# Patient Record
Sex: Male | Born: 1945
Health system: Southern US, Community
[De-identification: ages and names within clinical notes are randomized; demographics above are authoritative.]

## PROBLEM LIST (undated history)

## (undated) DIAGNOSIS — R351 Nocturia: Secondary | ICD-10-CM

## (undated) DIAGNOSIS — M2550 Pain in unspecified joint: Secondary | ICD-10-CM

## (undated) DIAGNOSIS — K219 Gastro-esophageal reflux disease without esophagitis: Secondary | ICD-10-CM

## (undated) DIAGNOSIS — M62838 Other muscle spasm: Secondary | ICD-10-CM

## (undated) DIAGNOSIS — I499 Cardiac arrhythmia, unspecified: Secondary | ICD-10-CM

## (undated) DIAGNOSIS — Z8619 Personal history of other infectious and parasitic diseases: Secondary | ICD-10-CM

## (undated) DIAGNOSIS — R600 Localized edema: Secondary | ICD-10-CM

## (undated) DIAGNOSIS — G8929 Other chronic pain: Secondary | ICD-10-CM

## (undated) DIAGNOSIS — I4891 Unspecified atrial fibrillation: Secondary | ICD-10-CM

## (undated) DIAGNOSIS — F329 Major depressive disorder, single episode, unspecified: Secondary | ICD-10-CM

## (undated) DIAGNOSIS — M2540 Effusion, unspecified joint: Secondary | ICD-10-CM

## (undated) DIAGNOSIS — C801 Malignant (primary) neoplasm, unspecified: Secondary | ICD-10-CM

## (undated) DIAGNOSIS — F32A Depression, unspecified: Secondary | ICD-10-CM

## (undated) DIAGNOSIS — J302 Other seasonal allergic rhinitis: Secondary | ICD-10-CM

## (undated) DIAGNOSIS — E291 Testicular hypofunction: Secondary | ICD-10-CM

## (undated) DIAGNOSIS — M549 Dorsalgia, unspecified: Secondary | ICD-10-CM

## (undated) DIAGNOSIS — Z9889 Other specified postprocedural states: Secondary | ICD-10-CM

## (undated) DIAGNOSIS — K225 Diverticulum of esophagus, acquired: Secondary | ICD-10-CM

## (undated) DIAGNOSIS — J189 Pneumonia, unspecified organism: Secondary | ICD-10-CM

## (undated) DIAGNOSIS — I509 Heart failure, unspecified: Secondary | ICD-10-CM

## (undated) DIAGNOSIS — M109 Gout, unspecified: Secondary | ICD-10-CM

## (undated) DIAGNOSIS — R609 Edema, unspecified: Secondary | ICD-10-CM

## (undated) DIAGNOSIS — M1990 Unspecified osteoarthritis, unspecified site: Secondary | ICD-10-CM

## (undated) DIAGNOSIS — Z8709 Personal history of other diseases of the respiratory system: Secondary | ICD-10-CM

## (undated) HISTORY — PX: MECKEL DIVERTICULUM EXCISION: SHX314

## (undated) HISTORY — PX: TONSILLECTOMY AND ADENOIDECTOMY: SHX28

## (undated) HISTORY — PX: SHOULDER ARTHROSCOPY: SHX128

## (undated) HISTORY — PX: HERNIA REPAIR: SHX51

## (undated) HISTORY — PX: KNEE SURGERY: SHX244

## (undated) HISTORY — PX: TONSILLECTOMY: SHX28A

## (undated) HISTORY — PX: ELBOW ARTHROSCOPY: SHX614

## (undated) HISTORY — PX: TONSILLECTOMY AND ADENOIDECTOMY: SUR1326

## (undated) HISTORY — DX: Testicular hypofunction: E29.1

## (undated) HISTORY — PX: TUMOR EXCISION: SHX421

## (undated) HISTORY — PX: ELBOW SURGERY: SHX618

## (undated) HISTORY — PX: INGUINAL HERNIA REPAIR: SUR1180

## (undated) HISTORY — PX: COLONOSCOPY: SHX174

## (undated) HISTORY — PX: ORIF HIP FRACTURE: SHX2125

## (undated) HISTORY — PX: ANKLE SURGERY: SHX546

## (undated) HISTORY — PX: OTHER SURGICAL HISTORY: SHX169

## (undated) HISTORY — DX: Unspecified osteoarthritis, unspecified site: M19.90

## (undated) HISTORY — DX: Gout, unspecified: M10.9

---
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
## 1967-04-04 DIAGNOSIS — J189 Pneumonia, unspecified organism: Secondary | ICD-10-CM

## 1967-04-04 HISTORY — DX: Pneumonia, unspecified organism: J18.9

## 2004-03-03 ENCOUNTER — Emergency Department (HOSPITAL_COMMUNITY): Admission: EM | Admit: 2004-03-03 | Discharge: 2004-03-03 | Payer: Self-pay | Admitting: Emergency Medicine

## 2004-12-17 ENCOUNTER — Encounter: Admission: RE | Admit: 2004-12-17 | Discharge: 2004-12-17 | Payer: Self-pay | Admitting: Orthopedic Surgery

## 2004-12-18 ENCOUNTER — Encounter: Admission: RE | Admit: 2004-12-18 | Discharge: 2004-12-18 | Payer: Self-pay | Admitting: Orthopedic Surgery

## 2005-01-17 ENCOUNTER — Encounter (INDEPENDENT_AMBULATORY_CARE_PROVIDER_SITE_OTHER): Payer: Self-pay | Admitting: *Deleted

## 2005-01-17 ENCOUNTER — Ambulatory Visit (HOSPITAL_COMMUNITY): Admission: RE | Admit: 2005-01-17 | Discharge: 2005-01-17 | Payer: Self-pay | Admitting: Orthopaedic Surgery

## 2005-01-17 ENCOUNTER — Ambulatory Visit (HOSPITAL_BASED_OUTPATIENT_CLINIC_OR_DEPARTMENT_OTHER): Admission: RE | Admit: 2005-01-17 | Discharge: 2005-01-17 | Payer: Self-pay | Admitting: Orthopaedic Surgery

## 2006-09-16 ENCOUNTER — Ambulatory Visit: Payer: Self-pay | Admitting: Cardiology

## 2006-09-16 ENCOUNTER — Emergency Department (HOSPITAL_COMMUNITY): Admission: EM | Admit: 2006-09-16 | Discharge: 2006-09-16 | Payer: Self-pay | Admitting: *Deleted

## 2006-09-21 ENCOUNTER — Ambulatory Visit: Payer: Self-pay

## 2006-10-01 ENCOUNTER — Encounter: Payer: Self-pay | Admitting: Cardiology

## 2006-10-01 ENCOUNTER — Ambulatory Visit: Payer: Self-pay

## 2006-10-10 ENCOUNTER — Ambulatory Visit: Payer: Self-pay | Admitting: Cardiology

## 2006-10-21 ENCOUNTER — Ambulatory Visit: Payer: Self-pay | Admitting: Cardiology

## 2006-10-22 ENCOUNTER — Ambulatory Visit: Payer: Self-pay | Admitting: Cardiology

## 2006-12-25 ENCOUNTER — Ambulatory Visit: Payer: Self-pay | Admitting: Cardiology

## 2007-06-10 ENCOUNTER — Ambulatory Visit: Payer: Self-pay | Admitting: Cardiology

## 2008-04-24 ENCOUNTER — Encounter: Payer: Self-pay | Admitting: Internal Medicine

## 2008-05-04 ENCOUNTER — Ambulatory Visit: Payer: Self-pay | Admitting: Internal Medicine

## 2008-05-04 DIAGNOSIS — M545 Low back pain: Secondary | ICD-10-CM | POA: Insufficient documentation

## 2008-05-04 DIAGNOSIS — M1990 Unspecified osteoarthritis, unspecified site: Secondary | ICD-10-CM | POA: Insufficient documentation

## 2008-05-04 DIAGNOSIS — M109 Gout, unspecified: Secondary | ICD-10-CM | POA: Insufficient documentation

## 2008-06-22 ENCOUNTER — Telehealth: Payer: Self-pay | Admitting: Internal Medicine

## 2008-09-16 ENCOUNTER — Encounter: Payer: Self-pay | Admitting: Internal Medicine

## 2008-10-29 ENCOUNTER — Ambulatory Visit (HOSPITAL_COMMUNITY): Admission: RE | Admit: 2008-10-29 | Discharge: 2008-10-29 | Payer: Self-pay | Admitting: Surgery

## 2008-11-13 ENCOUNTER — Encounter: Payer: Self-pay | Admitting: Internal Medicine

## 2009-03-17 ENCOUNTER — Ambulatory Visit: Payer: Self-pay | Admitting: Internal Medicine

## 2009-03-17 DIAGNOSIS — R634 Abnormal weight loss: Secondary | ICD-10-CM | POA: Insufficient documentation

## 2009-03-19 ENCOUNTER — Telehealth: Payer: Self-pay | Admitting: Internal Medicine

## 2009-03-24 ENCOUNTER — Encounter: Payer: Self-pay | Admitting: Internal Medicine

## 2009-03-30 ENCOUNTER — Encounter: Admission: RE | Admit: 2009-03-30 | Discharge: 2009-03-30 | Payer: Self-pay | Admitting: Surgery

## 2009-03-30 ENCOUNTER — Encounter: Payer: Self-pay | Admitting: Internal Medicine

## 2009-04-07 ENCOUNTER — Emergency Department (HOSPITAL_COMMUNITY): Admission: EM | Admit: 2009-04-07 | Discharge: 2009-04-07 | Payer: Self-pay | Admitting: Emergency Medicine

## 2009-06-16 ENCOUNTER — Ambulatory Visit: Payer: Self-pay | Admitting: Internal Medicine

## 2009-06-17 DIAGNOSIS — J309 Allergic rhinitis, unspecified: Secondary | ICD-10-CM | POA: Insufficient documentation

## 2009-12-27 ENCOUNTER — Encounter: Payer: Self-pay | Admitting: Internal Medicine

## 2009-12-28 ENCOUNTER — Ambulatory Visit: Payer: Self-pay | Admitting: Internal Medicine

## 2009-12-28 LAB — CONVERTED CEMR LAB
ALT: 25 units/L (ref 0–53)
AST: 35 units/L (ref 0–37)
Albumin: 4 g/dL (ref 3.5–5.2)
Alkaline Phosphatase: 78 units/L (ref 39–117)
BUN: 9 mg/dL (ref 6–23)
Basophils Absolute: 0 10*3/uL (ref 0.0–0.1)
Basophils Relative: 0.7 % (ref 0.0–3.0)
Bilirubin Urine: NEGATIVE
Bilirubin, Direct: 0.2 mg/dL (ref 0.0–0.3)
Blood in Urine, dipstick: NEGATIVE
CO2: 30 meq/L (ref 19–32)
Calcium: 9.1 mg/dL (ref 8.4–10.5)
Chloride: 104 meq/L (ref 96–112)
Cholesterol: 203 mg/dL — ABNORMAL HIGH (ref 0–200)
Creatinine, Ser: 1 mg/dL (ref 0.4–1.5)
Direct LDL: 74.7 mg/dL
Eosinophils Absolute: 0.1 10*3/uL (ref 0.0–0.7)
Eosinophils Relative: 1.6 % (ref 0.0–5.0)
GFR calc non Af Amer: 83.68 mL/min (ref >60)
Glucose, Bld: 128 mg/dL — ABNORMAL HIGH (ref 70–99)
Glucose, Urine, Semiquant: NEGATIVE
HCT: 37.7 % — ABNORMAL LOW (ref 39.0–52.0)
HDL: 45 mg/dL (ref >39.00)
Hemoglobin: 13.2 g/dL (ref 13.0–17.0)
Ketones, urine, test strip: NEGATIVE
Lymphocytes Relative: 34.7 % (ref 12.0–46.0)
Lymphs Abs: 1.4 10*3/uL (ref 0.7–4.0)
MCHC: 35 g/dL (ref 30.0–36.0)
MCV: 99.9 fL (ref 78.0–100.0)
Monocytes Absolute: 0.3 10*3/uL (ref 0.1–1.0)
Monocytes Relative: 7.1 % (ref 3.0–12.0)
Neutro Abs: 2.3 10*3/uL (ref 1.4–7.7)
Neutrophils Relative %: 55.9 % (ref 43.0–77.0)
Nitrite: NEGATIVE
PSA: 0.32 ng/mL (ref 0.10–4.00)
Platelets: 189 10*3/uL (ref 150.0–400.0)
Potassium: 4.2 meq/L (ref 3.5–5.1)
Protein, U semiquant: NEGATIVE
RBC: 3.77 M/uL — ABNORMAL LOW (ref 4.22–5.81)
RDW: 13.6 % (ref 11.5–14.6)
Sodium: 141 meq/L (ref 135–145)
Specific Gravity, Urine: 1.02
TSH: 1.66 microintl units/mL (ref 0.35–5.50)
Total Bilirubin: 1 mg/dL (ref 0.3–1.2)
Total CHOL/HDL Ratio: 5
Total Protein: 6.9 g/dL (ref 6.0–8.3)
Triglycerides: 453 mg/dL — ABNORMAL HIGH (ref 0.0–149.0)
Urobilinogen, UA: 0.2
VLDL: 90.6 mg/dL — ABNORMAL HIGH (ref 0.0–40.0)
WBC Urine, dipstick: NEGATIVE
WBC: 4 10*3/uL — ABNORMAL LOW (ref 4.5–10.5)
pH: 5.5

## 2010-01-04 ENCOUNTER — Ambulatory Visit: Payer: Self-pay | Admitting: Internal Medicine

## 2010-01-04 ENCOUNTER — Encounter: Payer: Self-pay | Admitting: Internal Medicine

## 2010-01-04 DIAGNOSIS — E291 Testicular hypofunction: Secondary | ICD-10-CM | POA: Insufficient documentation

## 2010-01-04 DIAGNOSIS — J984 Other disorders of lung: Secondary | ICD-10-CM | POA: Insufficient documentation

## 2010-05-05 NOTE — Assessment & Plan Note (Signed)
Summary: 3 month rov/njr   Vital Signs:  Patient profile:   65 year old male Weight:      222 pounds Temp:     98.4 degrees F oral BP sitting:   104 / 60  (right arm) Cuff size:   regular  Vitals Entered By: Duard Brady LPN (June 16, 2009 11:47 AM) CC: 3 mos rov - things to discuss with the doctor Is Patient Diabetic? No   CC:  3 mos rov - things to discuss with the doctor.  History of Present Illness: 65 year old patient who is seen today for follow-up.  He has a history weight loss that has been stable.  He is followed closely at the [facility] system.  He has osteoarthritis, chronic low back pain and a chronic pain syndrome.  He is on chronic oxycodone.  There is a history of gout, which has been stable.  Symptoms today include sinus congestion and drainage more left-sided.  He states that he has a sinus CT scheduled soon through the [place] system.  He is wondering if he would benefit from an ENT evaluation.  Denies any fever, localized pain or purulent drainage.  Denies any dental pain is chronic pain syndrome seems stable.  Preventive Screening-Counseling & Management  Alcohol-Tobacco     Smoking Status: never  Allergies (verified): No Known Drug Allergies  Past History:  Past Medical History: Low back pain Osteoarthritis bilateral knee pain Gout Allergic rhinitis  Review of Systems  The patient denies anorexia, fever, weight loss, weight gain, vision loss, decreased hearing, hoarseness, chest pain, syncope, dyspnea on exertion, peripheral edema, prolonged cough, headaches, hemoptysis, abdominal pain, melena, hematochezia, severe indigestion/heartburn, hematuria, incontinence, genital sores, muscle weakness, suspicious skin lesions, transient blindness, difficulty walking, depression, unusual weight change, abnormal bleeding, enlarged lymph nodes, angioedema, breast masses, and testicular masses.    Physical Exam  General:  Well-developed,well-nourished,in no  acute distress; alert,appropriate and cooperative throughout examination Head:  Normocephalic and atraumatic without obvious abnormalities. No apparent alopecia or balding. Ears:  External ear exam shows no significant lesions or deformities.  Otoscopic examination reveals clear canals, tympanic membranes are intact bilaterally without bulging, retraction, inflammation or discharge. Hearing is grossly normal bilaterally. Nose:  External nasal examination shows no deformity or inflammation. Nasal mucosa are pink and moist without lesions or exudates. Mouth:  Oral mucosa and oropharynx without lesions or exudates.  Teeth in good repair. Neck:  No deformities, masses, or tenderness noted. Lungs:  Normal respiratory effort, chest expands symmetrically. Lungs are clear to auscultation, no crackles or wheezes. Heart:  Normal rate and regular rhythm. S1 and S2 normal without gallop, murmur, click, rub or other extra sounds. Abdomen:  Bowel sounds positive,abdomen soft and non-tender without masses, organomegaly or hernias noted.   Impression & Recommendations:  Problem # 1:  ALLERGIC RHINITIS (ICD-477.9)  His updated medication list for this problem includes:    Fluticasone Propionate 50 Mcg/act Susp (Fluticasone propionate) ..... Use daily as needed for sinus congestion the patient will bring his sinus CT x-rays  by  for review  Problem # 2:  GOUT (ICD-274.9)  His updated medication list for this problem includes:    Allopurinol 100 Mg Tabs (Allopurinol) .Marland Kitchen... 2 once daily    Colchicine 0.6 Mg Tabs (Colchicine) .Marland Kitchen... 1 two times a day is clinically stable  Problem # 3:  WEIGHT LOSS (ICD-783.21) clinically stable  Problem # 4:  LOW BACK PAIN (ICD-724.2)  His updated medication list for this problem includes:  Oxycodone Hcl 5 Mg Caps (Oxycodone hcl) .Marland Kitchen... 1 q6h as needed    Bayer Aspirin 325 Mg Tabs (Aspirin) .Marland Kitchen... 1 once daily    Ibuprofen 600 Mg Tabs (Ibuprofen) .Marland Kitchen... Three times a day  as needed    Indomethacin 50 Mg Caps (Indomethacin) .Marland Kitchen... 1 three times a day as needed followed at the [facility] system  Complete Medication List: 1)  Allopurinol 100 Mg Tabs (Allopurinol) .... 2 once daily 2)  Oxycodone Hcl 5 Mg Caps (Oxycodone hcl) .Marland Kitchen.. 1 q6h as needed 3)  Bayer Aspirin 325 Mg Tabs (Aspirin) .Marland Kitchen.. 1 once daily 4)  Colchicine 0.6 Mg Tabs (Colchicine) .Marland Kitchen.. 1 two times a day 5)  Ibuprofen 600 Mg Tabs (Ibuprofen) .... Three times a day as needed 6)  Indomethacin 50 Mg Caps (Indomethacin) .Marland Kitchen.. 1 three times a day as needed 7)  Clobetasol Propionate E 0.05 % Crea (Clobetasol prop emollient base) .... Use as needed 8)  Fluticasone Propionate 50 Mcg/act Susp (Fluticasone propionate) .... Use daily as needed for sinus congestion  Patient Instructions: 1)  Please schedule a follow-up appointment in 6 months. 2)  It is important that you exercise regularly at least 20 minutes 5 times a week. If you develop chest pain, have severe difficulty breathing, or feel very tired , stop exercising immediately and seek medical attention.

## 2010-05-05 NOTE — Assessment & Plan Note (Signed)
Summary: CPX/NJR---PT [name] // RS   Vital Signs:  [name] profile:   65 year old male Height:      72.5 inches Weight:      231 pounds BMI:     31.01 Temp:     98.2 degrees F oral BP sitting:   110 / 80  (left arm) Cuff size:   regular  Vitals Entered By: Duard Brady LPN (January 04, 2010 2:08 PM) CC: cpx - doing well Is [name] Diabetic? No   CC:  cpx - doing well.  History of Present Illness: [name] who is seen today for an annual exam.  He is followed closely by urology and had an abdominal CT scan performed recently showed a 6-mm left lower lobe nodule.  The [name] states that he had had a CT scan 6 to 8 months prior and was due for follow-up in December of this year.  He states that the 6-mm nodule is unchanged compared to his initial CT scan.  He is low risk and has been a nonsmoker.  His mother did die at 14 of lung cancer and was also a nonsmoker.  He is scheduled for a colonoscopy in the near future.  He has a  history of gout, osteoarthritis, and chronic low back pain.  Allergies (verified): No Known Drug Allergies  Past History:  Past Medical History: Low back pain Osteoarthritis bilateral knee pain Gout Allergic rhinitis testosterone deficiency 6-mm left lower lobe pulmonary nodule  Past Surgical History: multiple knee surgeries bilaterally from the 1970s through the 1990s;  3 on the left and 3 on the right colonoscopy ([facility]) estimated 8 to 10 years ago status post right inguinal hernia repair July 2010  Family History: Reviewed history from 05/04/2008 and no changes required. father died age 32; mother, age 66- died lung ca age 53  One sister in good health  Social History: Reviewed history from 05/04/2008 and no changes required. Retired Married Never Smoked two adult children  Review of Systems       The [name] complains of peripheral edema and suspicious skin lesions.  The [name] denies anorexia, fever, weight loss, weight  gain, vision loss, decreased hearing, hoarseness, chest pain, syncope, dyspnea on exertion, prolonged cough, headaches, hemoptysis, abdominal pain, melena, hematochezia, severe indigestion/heartburn, hematuria, incontinence, genital sores, muscle weakness, transient blindness, difficulty walking, depression, unusual weight change, abnormal bleeding, enlarged lymph nodes, angioedema, breast masses, and testicular masses.    Physical Exam  General:  overweight-appearing.  normal blood pressureoverweight-appearing.   Head:  Normocephalic and atraumatic without obvious abnormalities. No apparent alopecia or balding. Eyes:  No corneal or conjunctival inflammation noted. EOMI. Perrla. Funduscopic exam benign, without hemorrhages, exudates or papilledema. Vision grossly normal. Ears:  External ear exam shows no significant lesions or deformities.  Otoscopic examination reveals clear canals, tympanic membranes are intact bilaterally without bulging, retraction, inflammation or discharge. Hearing is grossly normal bilaterally. Mouth:  Oral mucosa and oropharynx without lesions or exudates.  Teeth in good repair. Neck:  No deformities, masses, or tenderness noted. Chest Wall:  No deformities, masses, tenderness or gynecomastia noted. Breasts:  No masses or gynecomastia noted Lungs:  Normal respiratory effort, chest expands symmetrically. Lungs are clear to auscultation, no crackles or wheezes. Heart:  Normal rate and regular rhythm. S1 and S2 normal without gallop, murmur, click, rub or other extra sounds. Abdomen:  Bowel sounds positive,abdomen soft and non-tender without masses, organomegaly or hernias noted. Genitalia:  Testes bilaterally descended without nodularity, tenderness or  masses. No scrotal masses or lesions. No penis lesions or urethral discharge. Msk:  No deformity or scoliosis noted of thoracic or lumbar spine.   Pulses:  slightly diminished on the right Extremities:  2+ left pedal edema and  2+ right pedal edema.  2+ left pedal edema.   Neurologic:  alert & oriented X3, cranial nerves II-XII intact, sensation intact to light touch, and gait normal.  alert & oriented X3, cranial nerves II-XII intact, sensation intact to light touch, and gait normal.   Skin:  [name] had erythematous, scaly rash in a patchy distribution involving his lower extremities Cervical Nodes:  No lymphadenopathy noted Axillary Nodes:  No palpable lymphadenopathy Inguinal Nodes:  No significant adenopathy Psych:  Cognition and judgment appear intact. Alert and cooperative with normal attention span and concentration. No apparent delusions, illusions, hallucinations   Impression & Recommendations:  Problem # 1:  Preventive Health Care (ICD-V70.0)  Problem # 2:  PULMONARY NODULE, LEFT LOWER LOBE (ICD-518.89) consider a follow-up limited chest CT in 6 months  Complete Medication List: 1)  Allopurinol 100 Mg Tabs (Allopurinol) .... 2 once daily 2)  Oxycodone Hcl 5 Mg Caps (Oxycodone hcl) .Marland Kitchen.. 1 q6h as needed 3)  Bayer Aspirin 325 Mg Tabs (Aspirin) .Marland Kitchen.. 1 once daily 4)  Colchicine 0.6 Mg Tabs (Colchicine) .Marland Kitchen.. 1 two times a day 5)  Ibuprofen 600 Mg Tabs (Ibuprofen) .... Three times a day as needed 6)  Indomethacin 50 Mg Caps (Indomethacin) .Marland Kitchen.. 1 three times a day as needed 7)  Clobetasol Propionate E 0.05 % Crea (Clobetasol prop emollient base) .... Use as needed 8)  Fluticasone Propionate 50 Mcg/act Susp (Fluticasone propionate) .... Use daily as needed for sinus congestion 9)  Methocarbamol 750 Mg Tabs (Methocarbamol) .... Prn 10)  Flunisolide 0.025 % Soln (Flunisolide) .... Two times a day as directed 11)  Tamsulosin Hcl 0.4 Mg Caps (Tamsulosin hcl) .... Qd 12)  Depo-testosterone 200 Mg/ml Oil (Testosterone cypionate) .... Inject 1ml every 3 wks 13)  Triamcinolone Acetonide 0.1 % Crea (Triamcinolone acetonide) .... Apply to rash twice daily  Other Orders: EKG w/ Interpretation (93000)  [name]  Instructions: 1)  Limit your Sodium (Salt) to less than 2 grams a day(slightly less than 1/2 a teaspoon) to prevent fluid retention, swelling, or worsening of symptoms. 2)  It is important that you exercise regularly at least 20 minutes 5 times a week. If you develop chest pain, have severe difficulty breathing, or feel very tired , stop exercising immediately and seek medical attention. 3)  You need to lose weight. Consider a lower calorie diet and regular exercise.  4)  Please schedule a follow-up appointment in 6 months. Prescriptions: TRIAMCINOLONE ACETONIDE 0.1 % CREA (TRIAMCINOLONE ACETONIDE) apply to rash twice daily  #6 oz x 0   Entered and Authorized by:   Gordy Savers  MD   Signed by:   Gordy Savers  MD on 01/04/2010   Method used:   Print then Give to [name]   RxID:   [id number]    Immunization History:  Influenza Immunization History:    Influenza:  Historical (01/01/2010)  Zostavax History:    Zostavax # 1:  Zostavax (03/17/2009) flu vaccine given at [facility] clinic   KIK

## 2010-05-05 NOTE — Letter (Signed)
Summary: [facility]   Imported By: Maryln Gottron 04/22/2009 15:41:05  _____________________________________________________________________  External Attachment:    Type:   Image     Comment:   External Document

## 2010-06-09 ENCOUNTER — Ambulatory Visit (INDEPENDENT_AMBULATORY_CARE_PROVIDER_SITE_OTHER): Payer: Medicare Other | Admitting: Internal Medicine

## 2010-06-09 ENCOUNTER — Encounter: Payer: Self-pay | Admitting: Internal Medicine

## 2010-06-09 DIAGNOSIS — Z0000 Encounter for general adult medical examination without abnormal findings: Secondary | ICD-10-CM

## 2010-06-09 DIAGNOSIS — E291 Testicular hypofunction: Secondary | ICD-10-CM

## 2010-06-09 DIAGNOSIS — R5381 Other malaise: Secondary | ICD-10-CM

## 2010-06-09 DIAGNOSIS — R531 Weakness: Secondary | ICD-10-CM

## 2010-06-09 DIAGNOSIS — J984 Other disorders of lung: Secondary | ICD-10-CM

## 2010-06-09 DIAGNOSIS — R5383 Other fatigue: Secondary | ICD-10-CM

## 2010-06-09 DIAGNOSIS — M1990 Unspecified osteoarthritis, unspecified site: Secondary | ICD-10-CM

## 2010-06-09 DIAGNOSIS — M545 Low back pain: Secondary | ICD-10-CM

## 2010-06-09 MED ORDER — RABEPRAZOLE SODIUM 20 MG PO TBEC: 20.0000 mg | DELAYED_RELEASE_TABLET | Freq: Every day | ORAL | Status: DC

## 2010-06-09 NOTE — Progress Notes (Signed)
  Subjective:    Patient ID: [name], male    DOB: Dec 17, 1945, 65 y.o.   MRN: [id number]  HPI   The patient is a 65 year old gentleman who presents with a two-week history of fatigue. He states he has heard little energy. Over this period of time he has had some GI symptoms. He describes some intermittent diarrhea with urgency he also describes lower and upper abdominal discomfort and bloating. He was seen for his annual exam approximately 6 months ago. It was recommended that he have a colonoscopy performed at that time. He also has a history of a left lower lobe pulmonary nodule and was scheduled for followup CT scan of the chest. There's been no recent weight loss. He does have a history of chronic low back pain and does take when necessary narcotic medications. He also has a history of gout which has been stable. He does have testicular hypofunction taking biweekly testosterone injections    Review of Systems  Constitutional: Positive for fatigue. Negative for fever, chills and appetite change.  HENT: Negative for hearing loss, ear pain, congestion, sore throat, trouble swallowing, neck stiffness, dental problem, voice change and tinnitus.   Eyes: Negative for pain, discharge and visual disturbance.  Respiratory: Negative for cough, chest tightness, wheezing and stridor.   Cardiovascular: Negative for chest pain, palpitations and leg swelling.  Gastrointestinal: Positive for nausea, abdominal pain, diarrhea and abdominal distention. Negative for vomiting, constipation and blood in stool.  Genitourinary: Negative for urgency, hematuria, flank pain, discharge, difficulty urinating and genital sores.  Musculoskeletal: Negative for myalgias, back pain, joint swelling, arthralgias and gait problem.  Skin: Negative for rash.  Neurological: Negative for dizziness, syncope, speech difficulty, weakness, numbness and headaches.  Hematological: Negative for adenopathy. Does not bruise/bleed easily.   Psychiatric/Behavioral: Negative for behavioral problems and dysphoric mood. The patient is not nervous/anxious.        Objective:   Physical Exam  Constitutional: He is oriented to person, place, and time. He appears well-developed and well-nourished. No distress.  HENT:  Head: Normocephalic.  Right Ear: External ear normal.  Left Ear: External ear normal.  Eyes: Conjunctivae and EOM are normal.  Neck: Normal range of motion.  Cardiovascular: Normal rate and normal heart sounds.   Pulmonary/Chest: Breath sounds normal.  Abdominal: Soft. Bowel sounds are normal. He exhibits no distension and no mass. There is tenderness. There is no rebound and no guarding.        Mild epigastric tenderness  Musculoskeletal: Normal range of motion. He exhibits no edema and no tenderness.  Neurological: He is alert and oriented to person, place, and time.  Psychiatric: He has a normal mood and affect. His behavior is normal.          Assessment & Plan:   fatigue- we'll check updated lab. Abdominal pain and bloating. We'll treat empirically with AcipHex and recheck in 2 weeks. We'll schedule a colonoscopy.  We'll also followup on the chest CT to further investigate the left lower lobe pulmonary nodule

## 2010-06-09 NOTE — Patient Instructions (Signed)
Return office visit 2 weeks Avoids foods high in acid such as tomatoes citrus juices, and spicy foods.  Avoid eating within two hours of lying down or before exercising.  Do not overheat.  Try smaller more frequent meals.  If symptoms persist, elevate the head of her bed 12 inches while sleeping.  Schedule your colonoscopy to help detect colon cancer.

## 2010-06-10 LAB — BASIC METABOLIC PANEL
BUN: 9 mg/dL (ref 6–23)
CO2: 28 mEq/L (ref 19–32)
Calcium: 9.1 mg/dL (ref 8.4–10.5)
Chloride: 100 mEq/L (ref 96–112)
Creatinine, Ser: 1 mg/dL (ref 0.4–1.5)
GFR: 80.65 mL/min (ref >60.00)
Glucose, Bld: 107 mg/dL — ABNORMAL HIGH (ref 70–99)
Potassium: 4 mEq/L (ref 3.5–5.1)
Sodium: 137 mEq/L (ref 135–145)

## 2010-06-10 LAB — HEPATIC FUNCTION PANEL
ALT: 22 U/L (ref 0–53)
AST: 34 U/L (ref 0–37)
Albumin: 3.7 g/dL (ref 3.5–5.2)
Alkaline Phosphatase: 74 U/L (ref 39–117)
Bilirubin, Direct: 0.1 mg/dL (ref 0.0–0.3)
Total Bilirubin: 0.8 mg/dL (ref 0.3–1.2)
Total Protein: 6.9 g/dL (ref 6.0–8.3)

## 2010-06-10 LAB — CBC WITH DIFFERENTIAL/PLATELET
Basophils Absolute: 0 10*3/uL (ref 0.0–0.1)
Basophils Relative: 0.8 % (ref 0.0–3.0)
Eosinophils Absolute: 0 10*3/uL (ref 0.0–0.7)
Eosinophils Relative: 0.5 % (ref 0.0–5.0)
HCT: 40 % (ref 39.0–52.0)
Hemoglobin: 14.2 g/dL (ref 13.0–17.0)
Lymphocytes Relative: 37.7 % (ref 12.0–46.0)
Lymphs Abs: 1.9 10*3/uL (ref 0.7–4.0)
MCHC: 35.5 g/dL (ref 30.0–36.0)
MCV: 98.3 fl (ref 78.0–100.0)
Monocytes Absolute: 0.3 10*3/uL (ref 0.1–1.0)
Monocytes Relative: 6 % (ref 3.0–12.0)
Neutro Abs: 2.7 10*3/uL (ref 1.4–7.7)
Neutrophils Relative %: 55 % (ref 43.0–77.0)
Platelets: 233 10*3/uL (ref 150.0–400.0)
RBC: 4.07 Mil/uL — ABNORMAL LOW (ref 4.22–5.81)
RDW: 12.9 % (ref 11.5–14.6)
WBC: 4.9 10*3/uL (ref 4.5–10.5)

## 2010-06-10 LAB — TSH: TSH: 0.39 u[IU]/mL (ref 0.35–5.50)

## 2010-06-10 LAB — TESTOSTERONE: Testosterone: 506.55 ng/dL (ref 350.00–890.00)

## 2010-06-17 ENCOUNTER — Telehealth: Payer: Self-pay | Admitting: Internal Medicine

## 2010-06-17 NOTE — Telephone Encounter (Signed)
Called pt informed of lab results

## 2010-06-17 NOTE — Telephone Encounter (Signed)
Please advise 

## 2010-06-17 NOTE — Telephone Encounter (Signed)
Pt called to get lab results. Pls call back asap.

## 2010-06-17 NOTE — Telephone Encounter (Signed)
Please notify patient that all his labs including testosterone level is normal

## 2010-07-01 ENCOUNTER — Ambulatory Visit (INDEPENDENT_AMBULATORY_CARE_PROVIDER_SITE_OTHER)
Admission: RE | Admit: 2010-07-01 | Discharge: 2010-07-01 | Disposition: A | Discharge status: Home / Self Care | Payer: Medicare Other | Source: Ambulatory Visit | Attending: Internal Medicine | Admitting: Internal Medicine

## 2010-07-01 DIAGNOSIS — J984 Other disorders of lung: Secondary | ICD-10-CM

## 2010-07-05 ENCOUNTER — Ambulatory Visit (INDEPENDENT_AMBULATORY_CARE_PROVIDER_SITE_OTHER): Payer: Medicare Other | Admitting: Internal Medicine

## 2010-07-05 ENCOUNTER — Encounter: Payer: Self-pay | Admitting: Internal Medicine

## 2010-07-05 DIAGNOSIS — M545 Low back pain, unspecified: Secondary | ICD-10-CM

## 2010-07-05 DIAGNOSIS — J984 Other disorders of lung: Secondary | ICD-10-CM

## 2010-07-05 DIAGNOSIS — M109 Gout, unspecified: Secondary | ICD-10-CM

## 2010-07-05 DIAGNOSIS — M1990 Unspecified osteoarthritis, unspecified site: Secondary | ICD-10-CM

## 2010-07-05 MED ORDER — HYDROCODONE-ACETAMINOPHEN 5-500 MG PO TABS: 1.0000 | ORAL_TABLET | ORAL | Status: DC | PRN

## 2010-07-05 NOTE — Patient Instructions (Signed)
Annual exam in 6 months Call or return to clinic prn if these symptoms worsen or fail to improve as anticipated.

## 2010-07-05 NOTE — Progress Notes (Signed)
  Subjective:    Patient ID: [name], male    DOB: 19-Mar-1946, 65 y.o.   MRN: [id number]  HPI   A 65 year old patient who is seen today in followup. He was seen one month ago with some GI complaints and a colonoscopy has been scheduled. He did have a followup chest CT dated today since this is density in the right lower lobe. CT scan was unremarkable. He does have a history of osteoarthritis and chronic low back pain and is followed at the [facility] system. He's had a recent fall with trauma to the right arm and right leg.   Review of Systems  Constitutional: Negative for fever, chills, appetite change and fatigue.  HENT: Negative for hearing loss, ear pain, congestion, sore throat, trouble swallowing, neck stiffness, dental problem, voice change and tinnitus.   Eyes: Negative for pain, discharge and visual disturbance.  Respiratory: Negative for cough, chest tightness, wheezing and stridor.   Cardiovascular: Negative for chest pain, palpitations and leg swelling.  Gastrointestinal: Negative for nausea, vomiting, abdominal pain, diarrhea, constipation, blood in stool and abdominal distention.  Genitourinary: Negative for urgency, hematuria, flank pain, discharge, difficulty urinating and genital sores.  Musculoskeletal: Negative for myalgias, back pain, joint swelling, arthralgias and gait problem.  Skin: Negative for rash.  Neurological: Negative for dizziness, syncope, speech difficulty, weakness, numbness and headaches.  Hematological: Negative for adenopathy. Does not bruise/bleed easily.  Psychiatric/Behavioral: Negative for behavioral problems and dysphoric mood. The patient is not nervous/anxious.        Objective:   Physical Exam  Constitutional: He is oriented to person, place, and time. He appears well-developed.  HENT:  Head: Normocephalic.  Right Ear: External ear normal.  Left Ear: External ear normal.  Eyes: Conjunctivae and EOM are normal.  Neck: Normal range of  motion.  Cardiovascular: Normal rate and normal heart sounds.   Pulmonary/Chest: Breath sounds normal.  Abdominal: Bowel sounds are normal.  Musculoskeletal: Normal range of motion. He exhibits no edema and no tenderness.  Neurological: He is alert and oriented to person, place, and time.  Psychiatric: He has a normal mood and affect. His behavior is normal.          Assessment & Plan:  Osteoarthritis with chronic low back pain Abnormal chest x-ray with unremarkable chest CT. A copy of the report was given to the patient that he can share with his [facility] physicians  CPX in 6 months

## 2010-07-10 LAB — HEMOGLOBIN AND HEMATOCRIT, BLOOD
HCT: 37.2 % — ABNORMAL LOW (ref 39.0–52.0)
Hemoglobin: 13.3 g/dL (ref 13.0–17.0)

## 2010-08-16 NOTE — Op Note (Signed)
NAMEELYE, HARMSEN               ACCOUNT NO.:  [id number]   MEDICAL RECORD NO.:  [id number]          PATIENT TYPE:  AMB   LOCATION:  DAY                          FACILITY:  [facility]   PHYSICIAN:  Sandria Bales. Ezzard Standing, M.D.  DATE OF BIRTH:  02-Mar-1946   DATE OF PROCEDURE:  10/29/2008  DATE OF DISCHARGE:                               OPERATIVE REPORT   Date of operation   PREOPERATIVE DIAGNOSIS:  Right inguinal hernia.   POSTOPERATIVE DIAGNOSIS:  Direct right inguinal hernia.   PROCEDURE:  Laparoscopic repair of right inguinal hernia with atrium  C-  Qur Lite mesh.   FIRST ASSISTANT:  No first assistant.   ANESTHESIA:  General endotracheal with 30 mL of 0.25% Marcaine.   COMPLICATIONS:  None.   INDICATIONS FOR PROCEDURE:  Mr. Bula is a 65 year old white male  patient who goes to the [facility] for his primary care and sees Dr.  Yisroel Ramming locally for orthopedic problems and has a symptomatic right  inguinal hernia.   I discussed with him about repairing the hernia laparoscopically.  I  discussed the indications and the potential complications.  The  potential complications include, but are not limited to, bleeding,  infection, nerve injury and recurrence of the hernia.   OPERATIVE NOTE:  The patient was placed in a small position.  He had a  general endotracheal anesthetic.  His groin was prepped with Techni-Care  and ChloraPrep.   A time-out was held identifying the patient and procedure.   For access to the abdominal cavity, I went through an infraumbilical  incision.  I went to the right of the rectus abdominis fascia.  I went  through the anterior fascia.  I retracted the rectus abdominal muscle  anteriorly.  I went to the space behind the rectus abdominal muscle and  passed the Covidien preperitoneal balloon in the preperitoneal space and  inspected in the center under direct visualization.  I got a moderate  distention with no obvious peritoneal tears.   I then placed two  additional trocars, a 5 mm in the left lower quadrant  and a 5 mm in the right lower quadrant under direct visualization.  I  then cleaned off the pubic bone and Cooper's ligament.  I isolated the  inguinal structures on th right side.  I identified the peritoneum and  there was no evidence of an indirect hernia.  He did have a lateral  aspect of his floor, had about a 1.5-2 cm defect consistent with a  direct inguinal hernia.   After exposing the floor,  I then introduced a piece of atrium C-QUR  Lite mesh and placed this around the cord structures.   I then tacked this medially to the pubic tubercle, inferiorly to  Cooper's ligament and superiorly to the transversalis fascia.  I avoided  placing tacks lateral to the iliac vessels and inferior to the iliopubic  tract.   The mesh lay flat.  I did take photos of this and placed them in the  chart.  It was a little oozy during the case, but most of this was  controlled with Bovie electrocautery.  I removed the trocars.  There was  no bleeding from the trocar sites at the end of the case.  I desufflated  the preperitoneal space.  I closed the anterior rectus fascia with  interrupted 0-Vicryl suture.  I then closed the subcutaneous tissue with  3-0 Vicryl suture and the skin with 5-0 Vicryl suture.  I painted the  wounds with Tincture of Benzoin and Steri-Stripped them.   The patient tolerated the procedure well and was transported to the  recovery room in good condition.      Sandria Bales. Ezzard Standing, M.D.  Electronically Signed     DHN/MEDQ  D:  10/29/2008  T:  10/29/2008  Job:  [id number]   cc:   [facility]  [place] G. Jerl Santos, M.D.  Fax: [phone]

## 2010-08-16 NOTE — Consult Note (Signed)
[name], [name]               ACCOUNT NO.:  [id number]   MEDICAL RECORD NO.:  [id number]          PATIENT TYPE:  EMS   LOCATION:  MAJO                         FACILITY:  [facility]   PHYSICIAN:  Madolyn Frieze. Jens Som, MD, FACCDATE OF BIRTH:  1945-09-08   DATE OF CONSULTATION:  09/16/2006  DATE OF DISCHARGE:                                 CONSULTATION   [name] is a pleasant 65 year old male with no significant past  medical history, other than gout and osteoarthritis, whom we are asked  to evaluate for palpitations.  He has no prior cardiac history.  He  typically does not have dyspnea on exertion, orthopnea, PND,  palpitations, presyncope, syncope or exertional chest pain.  He  occasionally has mild pedal edema but attributes this to his  osteoarthritis.  Over the past 48 hours, he has had 4 separate episodes  of palpitations.  They are sudden onset and described as heart racing.  They are also sudden in offset.  They last for 2-3 minutes and resolve  spontaneously.  They are not exertional and there is no associated chest  pain, dyspnea and he has had no syncope, although there is mild  dizziness.  He came the emergency room for further evaluation but was  not having any symptoms at the time.  Cardiology is asked to further  evaluate.   MEDICATIONS:  At present include hydrocodone between 2 to 6 daily  p.r.n., allopurinol 300 mg p.o. daily, and enteric-coated aspirin 81 mg  daily.   ALLERGIES:  He has no known drug allergies.   SOCIAL HISTORY:  He does not smoke.  He consumes 5 alcoholic beverages  per day.  He consumes 2-4 caffeinated beverages per day.   FAMILY HISTORY:  Negative for coronary artery disease or sudden death.   PAST MEDICAL HISTORY:  There is no hypertension, hyperlipidemia, or  diabetes mellitus by his report.  He has no other medical problems other  than gout.   PAST SURGICAL HISTORY:  He has had 3 previous knee surgeries  bilaterally.  He has also had a  previous tonsillectomy.  He has had  previous elbow surgery.   REVIEW OF SYSTEMS:  There is no headaches or fevers or chills.  There is  no productive cough or hemoptysis.  There is no dysphagia, odynophagia,  melena, or hematochezia.  There is no dysuria, hematuria, paresthesia,  rash, or seizure activity.  There is no orthopnea, PND or pedal edema.  The remaining systems are negative.   PHYSICAL EXAM:  VITAL SIGNS:  Blood pressure 166/94.  His pulse is 92.  GENERAL:  He is well developed, well nourished, in no acute stress.  SKIN:  Warm and dry.  There is no peripheral clubbing.  PSYCHIATRIC:  He does not appear to be depressed.  HEENT:  Normal with normal eyelids.  NECK:  Supple with a normal upstroke bilaterally.  I cannot appreciate  bruits.  There is no jugular distention and no thyromegaly is noted.  CHEST:  Clear to auscultation.  Normal expansion.  CARDIOVASCULAR EXAM:  Regular rate and rhythm.  Normal S1, S2.  I cannot  hear murmurs, rubs or gallops.  ABDOMINAL EXAM:  Nontender.  Positive bowel sounds.  No  hepatosplenomegaly.  No mass appreciated.  There is no abdominal bruit.  PULSES:  He has 2+ femoral pulses bilaterally.  No bruits.  EXTREMITIES:  Show no edema and I can palpate no cords.  He has 2+  posterior tibialis pulses bilaterally.  NEUROLOGIC EXAM:  Grossly intact.   His electrocardiogram shows sinus rhythm at a rate of 99.  The axis is  normal.  There are no ST changes.  There is no delta wave noted.  His QT  is not prolonged.  His left atrium is enlarged.   LABORATORIES:  Show hemoglobin/hematocrit 13.2 and 37 respectively.  His  white blood cell count is normal as his platelet count.  His potassium  is 4.  BUN is 10 and his creatinine is 0.9.   DIAGNOSIS:  Palpitations, possibly secondary to supraventricular  tachycardia - we discussed the possibility of a 24-hour observation  admission.  However, his wife is out of town and he would prefer to do  this an  outpatient.  He also states he needs to go home to check on  things at home that he may have left running.  I think this is certainly  reasonable as his symptoms are 2-3 minutes in duration and there is been  no chest pain or dyspnea, and his electrocardiogram is normal.  We will  check one set of cardiac markers.  If they are normal, we will pursue  this is an outpatient.  We will add Toprol-XL 25 mg p.o. daily.  I will  schedule him to have an outpatient echocardiogram, Cardizem and stress  Myoview.  We will also check a thyroid-stimulating hormone.  He will  follow-up with me in 2-4 weeks for further review of the above.  He  needs to decrease his alcohol use and caffeine use and we discussed  this.      Madolyn Frieze Jens Som, MD, [facility]  Electronically Signed     BSC/MEDQ  D:  09/16/2006  T:  09/17/2006  Job:  [id number]

## 2010-08-16 NOTE — Assessment & Plan Note (Signed)
[facility]                            CARDIOLOGY OFFICE NOTE   [name], [name]                      MRN:          [id number]  DATE:06/10/2007                            DOB:          1945/08/23    [name] is a very pleasant gentleman who has a history of brief  palpitations in the emergency room one time.  Previous TSH was normal,  and an echocardiogram showed normal LV function with possible  hypokinesis of the periapical wall.  Previous Myoview in June of 2008  showed an ejection fraction of 62% with mild apical thinning but no  ischemia.  CardioNet showed one rhythm strip showing a 10-second run of  paroxysmal atrial fibrillation versus PAT.  He has therefore been  treated with aspirin.  We did try Toprol-XL on the patient, but it  causes sexual dysfunction.  Note, since I last saw him, he has done  well.  There is no dyspnea, chest pain, palpitations, or syncope.  There  is no pedal edema.  He is having back pain which is a chronic issue for  him.   MEDICATIONS:  1. Allopurinol 300 mg daily.  2. Multivitamin.  3. Aspirin 325 mg p.o. daily.  4. Hydrocodone as needed.   PHYSICAL EXAMINATION:  VITAL SIGNS:  Blood pressure 150/80, pulse 77.  He weighs 234 pounds.  HEENT:  Normal.  NECK:  Supple.  CHEST:  Clear.  CARDIOVASCULAR:  Regular rate.  ABDOMEN:  No tenderness.  EXTREMITIES:  No edema.   Electrocardiogram shows sinus rhythm at a rate of 77.  There are no ST  changes noted.   DIAGNOSIS:  History of palpitations.  He has had no further episodes of  palpitations.  A previous CardioNet showed 10 seconds of atrial  fibrillation versus paroxysmal atrial tachycardia.  He will continue on  his aspirin.  He has had no further palpitations.  Note, he has no  embolic risk factors including no history of hypertension, diabetes,  prior cerebrovascular accident, and his left ventricular function is  normal.  We will therefore make no  changes in his medications.  I have  asked him to follow up with his primary care physician for further  management.  He will see [place] back on as-needed basis.     Madolyn Frieze David Som, MD, [facility]  Electronically Signed    BSC/MedQ  DD: 06/10/2007  DT: 06/11/2007  Job #: [id number]

## 2010-08-16 NOTE — Assessment & Plan Note (Signed)
[facility]                            CARDIOLOGY OFFICE NOTE   NORFLEET, CAPERS                      MRN:          [id number]  DATE:10/22/2006                            DOB:          1945/12/09    Mr. Setzer is a 65 year old gentleman that we recently saw in the  emergency room secondary to palpitations.  These were sudden in onset  and occurred for several minutes and resolved spontaneously.  There was  no chest pain or shortness of breath.  His electrolytes were normal.  A  TSH was normal.  He was scheduled for an outpatient echocardiogram which  was performed on October 01, 2006.  His LV function was normal.  There  appeared to be hypokinesis of the periapical wall.  There was mild  aortic root dilatation.  There was mild to moderate left atrial  enlargement and mild right atrial enlargement.  A Myoview was performed  on September 21, 2006.  The ejection fraction was 62%.  There was mild apical  thinning but no scar or ischemia was seen.  Since that time, he has had  no further chest pain, shortness of breath, or palpitations.   MEDICATIONS:  1. Hydrocodone.  2. Allopurinol.  3. Aspirin 81 mg p.o. daily.   PHYSICAL EXAMINATION:  VITAL SIGNS:  Shows a blood pressure at 141/83.  His pulse is 75.  HEENT:  Normal.  NECK:  Supple.  CHEST:  Clear  CARDIOVASCULAR:  Reveals a regular rate and rhythm.  ABDOMEN:  Benign.  EXTREMITIES:  Show no edema.   His electrocardiogram shows a sinus rhythm at a rate of 72.  The axis is  normal.  There are no ST changes noted.   DIAGNOSES:  Recent palpitations of unclear etiology.  These symptoms  have completely resolved.  His TSH was normal and his workup has been  negative.  He did wear a CardioNet monitor but had no symptoms while  wearing this.  We have elected to continue off of the Toprol as he has  been off of this for some time without recurrent symptoms.  We can  resume this in the future if he has  recurrent palpitations.  We could  also plan to repeat his CardioNet monitor.  We will see him back on an  as needed basis (He will contact us if he has recurrence of  palpitations).     Madolyn Frieze Jens Som, MD, [facility]  Electronically Signed    BSC/MedQ  DD: 10/22/2006  DT: 10/22/2006  Job #: [id number]

## 2010-08-16 NOTE — Assessment & Plan Note (Signed)
[facility]                            CARDIOLOGY OFFICE NOTE   [name], [name]                      MRN:          [id number]  DATE:10/10/2006                            DOB:          June 28, 1945    PRIMARY CARE PHYSICIAN:  Olga Millers, M.D.   HISTORY OF PRESENT ILLNESS:  This is a 65 year old white male patient  who presented to [facility] after being sent from urgent care  with palpitations and probably secondary to SVT. The patient's cardiac  enzymes were normal. Toprol 25 mg a day was added and he was scheduled  for outpatient Myoview, echocardiogram, and event recorder. Since the  patient has been home, he has had no further palpitations. He says they  occurred 2 mornings in a row after he got up and ate breakfast and had 2  cups of coffee, his heart just started racing. He did not have any  dizziness or syncope with this. He does drink green tea throughout the  afternoon and occasionally drinks energy drinks when he is driving to  [place]. He has cut back on his caffeine. A 2-D echocardiogram shows  normal LV systolic function, ejection fraction of 55% to 60% with  hypokinesis in the periapical wall, mild aortic root dilatation. Left  atrium was mild to moderately dilated. Right atrium was mildly dilated.  Stress Cardiolite showed mild apical thinning but no sign or scar or  ischemia. Ejection fraction is 62%. He is currently wearing the event  recorder but has had no recurrent symptoms. TSH was normal in the  hospital.   CURRENT MEDICATIONS:  He stopped the Toprol. He is on aspirin 81 mg  daily, Allopurinol 300 mg daily, and hydrocodone 2 to 6 daily.   PHYSICAL EXAMINATION:  GENERAL:  This is a pleasant 65 year old white  male in no acute distress.  VITAL SIGNS:  Blood pressure 124/86, pulse 85, weight 241.  NECK:  Without JVD, HJR, bruit, or thyroid enlargement.  LUNGS:  Clear anterior and posterolateral.  HEART:  Regular  rate and rhythm at 85 beats per minute. Normal S1 and  S2. No murmur, rub, bruit, thrill, or heave noted.  ABDOMEN:  Soft without organomegaly, masses, lesions, or abnormal  tenderness.  EXTREMITIES:  Without clubbing, cyanosis, or edema. He has good distal  pulses.   IMPRESSION:  1. Palpitations, probably secondary to supraventricular tachycardia.      Have resolved. Currently wearing an event recorder.  2. Mild apical thinning but no ischemia on Cardiolite.  3. Normal left ventricular function with mild aortic root dilatation,      mild to moderate left atrium dilatation and right atrium dilatation      on echocardiogram.   PLAN:  The patient is stable to resume his normal activities. He will  return to see Dr. Jens Som on the 21st, at which time his event recorder  will be returned. I have asked him to avoid caffeine products and  becoming dehydrated.      Jacolyn Reedy, PA-C  Electronically Signed      Jonelle Sidle, MD  Electronically Signed   ML/MedQ  DD: 10/10/2006  DT: 10/10/2006  Job #: [id number]   cc:   Madolyn Frieze. Jens Som, MD, [facility]

## 2010-08-16 NOTE — Assessment & Plan Note (Signed)
[facility]                            CARDIOLOGY OFFICE NOTE   DEONE, LEIFHEIT                      MRN:          [id number]  DATE:12/25/2006                            DOB:          1945-04-04    Mr. [name] is a 65 year old gentleman that I have seen recently for  palpitations in the emergency room.  They were sudden in onset and  lasted for 2-5 minutes each.  We did perform a TSH which was normal.  An  outpatient echocardiogram showed normal left ventricular function with  possible hypokinesis at the periapical wall.  Myoview was performed on  September 21, 2006, and showed an ejection fraction of 62% with mild apical  fanning with no ischemia.  We also scheduled him to have a CardioNet.  There was 1 rhythm strip, and this showed a brief approximate 10 second  run of paroxysmal atrial fibrillation versus PAT.  We did ask him to  begin Toprol 25 mg p.o. daily as well as enteric-coated aspirin 325 mg  p.o. daily.  Since then, he has not had any dyspnea, chest pain,  palpitations, or syncope.  He did state that the Toprol was causing  sexual dysfunction.   His medications include:  1. Allopurinol 300 mg p.o. daily.  2. Multivitamin.  3. Toprol 25 mg p.o. daily which has now been discontinued.  4. Oxycodone.  5. Aspirin 325 mg p.o. daily.   PHYSICAL EXAM:  Blood pressure of 147/93, and his pulse is 86.  He  weighs 239 pounds.  HEENT:  Normal.  NECK:  Supple.  CHEST:  Clear.  CARDIOVASCULAR:  Regular rate and rhythm.  ABDOMEN:  Benign.  EXTREMITIES:  No edema.   DIAGNOSES:  Palpitations - The patient did have a short approximate 10  second run of atrial fibrillation on his CardioNet monitor; however, he  did not have symptoms with this.  However, this makes me wonder whether  his palpitations prior to presenting to the emergency room were indeed  paroxysmal atrial fibrillation, although I have no diagnostic evidence  of this.  I had long  discussions with him today concerning this issue.  If he has recurrent palpitations in the future, then I have asked him to  present to the office for an electrocardiogram, or we may need to repeat  his CardioNet monitor in the future.  He really has no embolic risk  factors, and I have asked him to take enteric-coated aspirin 325 mg p.o.  daily.  He does not want to take the Toprol, as it is causing impotence.  We will therefore not continue with any medications.  If he has  recurrent palpitations in the future, we could consider Cardizem.  I  will see him back in 6 months to see if his symptoms have worsened.     [name] David Som, MD, [facility]  Electronically Signed    BSC/MedQ  DD: 12/25/2006  DT: 12/25/2006  Job #: [id number]

## 2010-08-19 NOTE — Op Note (Signed)
NAMEJONTRELL, BUSHONG               ACCOUNT NO.:  [id number]   MEDICAL RECORD NO.:  [id number]          PATIENT TYPE:  AMB   LOCATION:  DSC                          FACILITY:  [facility]   PHYSICIAN:  Lubertha Basque. Dalldorf, M.D.DATE OF BIRTH:  1946-01-15   DATE OF PROCEDURE:  01/17/2005  DATE OF DISCHARGE:                                 OPERATIVE REPORT   PREOPERATIVE DIAGNOSES:  1.  Right elbow olecranon bursitis.  2.  Right elbow partial triceps tear.   POSTOPERATIVE DIAGNOSES:  1.  Right elbow olecranon bursitis.  2.  Right elbow partial triceps tear.   OPERATION PERFORMED:  1.  Right elbow olecranon bursectomy.  2.  Right elbow triceps repair.   SURGEON:  Lubertha Basque. Jerl Santos, M.D.   ASSISTANT:  Prince Rome, P.A.   ANESTHESIA:  General.   INDICATIONS FOR PROCEDURE:  The patient is a 65 year old man with a long  history of right elbow pain and swelling.  This persisted despite rest and  padding and oral anti-inflammatories.  It also seems as though he has had  some injections about the elbow on several occasions.  By MRI scan, he has a  near complete rupture of his triceps tendon.  He also has a very prominent  olecranon bursa with some gouty deposits.  With continued difficulty using  his arm at work and at home, he is offered a procedure.  The plan is to  perform an olecranon bursectomy and remove a small olecranon spur.  We will  then excise the devitalized portion of his triceps and repair the structure  back to bone.  Informed operative consent was obtained after discussion of  possible complications of reaction to anesthesia, infection, neurovascular  injury, and repeat rupture.   DESCRIPTION OF PROCEDURE:  The patient was taken to the operating suite  where general anesthetic was applied without difficulty.  The patient was  positioned supine and prepped and draped in the normal sterile fashion.  After administration of preop intravenous antibiotics, the right arm  was  elevated, exsanguinated, and a tourniquet inflated about the upper arm.  A  posterior longitudinal incision was made with dissection down to a prominent  olecranon bursa which did appear to have some calcific or gouty deposits  within.  The bursa was excised.  I then made a longitudinal split in the  triceps attachment and this was very devitalized tissue.  This devitalized  tissue which consisted of more than half of the triceps tendon attachment  was excised.  I used fluoroscopy to remove a small spur on the olecranon and  read the fluoroscopic views myself to make appropriate intraoperative  decisions.  I then made a trough of bone with a rongeur and secured the  triceps in [place] fashion using #2 FiberWire.  I then made two drill holes  in the olecranon which originated at the trough created with the rongeur and  exited along the olecranon superficially.  I passed the two ends of the #2  FiberWire through these tunnels and tied the suture over a bony bridge on  the olecranon thereby  reapproximating the triceps to this trough of bleeding  bone.  This seemed to secure the triceps very well and the elbow ranged  fully.  The wound was then irrigated followed by release of tourniquet.  A  small amount of bleeding was easily controlled with Bovie cautery.  We then  repaired subcutaneous tissues with 2-0 undyed Vicryl and skin with nylon.  I  injected the wound with some Marcaine.  Adaptic was applied followed by a  posterior splint of plaster with the elbow in 90 degrees of flexion.  Estimated blood loss and intraoperative fluids as well as accurate  tourniquet time can be obtained from anesthesia records.   DISPOSITION:  The patient was extubated in the operating room and taken to  the recovery room in stable condition.  Plans were for the patient to go  home the same day and to follow up in the office in less than a week.  I  will contact him by phone tonight.      Lubertha Basque  Jerl Santos, M.D.  Electronically Signed     PGD/MEDQ  D:  01/17/2005  T:  01/17/2005  Job:  [id number]

## 2010-08-25 ENCOUNTER — Ambulatory Visit (AMBULATORY_SURGERY_CENTER): Payer: Medicare Other | Admitting: *Deleted

## 2010-08-25 VITALS — Ht 74.0 in | Wt 217.0 lb

## 2010-08-25 DIAGNOSIS — Z1211 Encounter for screening for malignant neoplasm of colon: Secondary | ICD-10-CM

## 2010-08-25 MED ORDER — PEG-KCL-NACL-NASULF-NA ASC-C 100 G PO SOLR: ORAL | Status: DC

## 2010-08-25 NOTE — Progress Notes (Signed)
Mr. Wyatt did not want Propofol sedation.  He said he had his first colon with Dr. Doreatha Martin without sedation and was ok with being awake for this one if he doesn't get the sedative effect due to being on pain meds.

## 2010-09-07 ENCOUNTER — Encounter: Payer: Self-pay | Admitting: Gastroenterology

## 2010-09-07 ENCOUNTER — Ambulatory Visit (AMBULATORY_SURGERY_CENTER): Payer: Medicare Other | Admitting: Gastroenterology

## 2010-09-07 VITALS — BP 154/85 | HR 82 | Temp 98.2°F | Resp 20 | Ht 74.0 in | Wt 217.0 lb

## 2010-09-07 DIAGNOSIS — D126 Benign neoplasm of colon, unspecified: Secondary | ICD-10-CM

## 2010-09-07 DIAGNOSIS — K620 Anal polyp: Secondary | ICD-10-CM

## 2010-09-07 DIAGNOSIS — Z1211 Encounter for screening for malignant neoplasm of colon: Secondary | ICD-10-CM

## 2010-09-07 DIAGNOSIS — D128 Benign neoplasm of rectum: Secondary | ICD-10-CM

## 2010-09-07 DIAGNOSIS — D129 Benign neoplasm of anus and anal canal: Secondary | ICD-10-CM

## 2010-09-07 MED ORDER — SODIUM CHLORIDE 0.9 % IV SOLN: 500.0000 mL | INTRAVENOUS | Status: DC

## 2010-09-07 NOTE — Progress Notes (Signed)
Encouraged patient and caregiver to follow discharge instructions. Encouraged caregiver to take patient home, not to leave in a car unattended or going into a resturant.

## 2010-09-07 NOTE — Patient Instructions (Signed)
Follow discharge instructions.  Continue your medications.  Await pathology results. 

## 2010-09-08 ENCOUNTER — Telehealth: Payer: Self-pay | Admitting: *Deleted

## 2010-09-08 NOTE — Telephone Encounter (Signed)

## 2010-09-14 ENCOUNTER — Encounter: Payer: Self-pay | Admitting: Gastroenterology

## 2010-09-15 ENCOUNTER — Ambulatory Visit (INDEPENDENT_AMBULATORY_CARE_PROVIDER_SITE_OTHER): Payer: Medicare Other | Admitting: Internal Medicine

## 2010-09-15 ENCOUNTER — Encounter: Payer: Self-pay | Admitting: Internal Medicine

## 2010-09-15 DIAGNOSIS — Z860100 Personal history of colon polyps, unspecified: Secondary | ICD-10-CM

## 2010-09-15 DIAGNOSIS — M545 Low back pain, unspecified: Secondary | ICD-10-CM

## 2010-09-15 DIAGNOSIS — R531 Weakness: Secondary | ICD-10-CM

## 2010-09-15 DIAGNOSIS — R5381 Other malaise: Secondary | ICD-10-CM

## 2010-09-15 DIAGNOSIS — R5383 Other fatigue: Secondary | ICD-10-CM

## 2010-09-15 DIAGNOSIS — M1990 Unspecified osteoarthritis, unspecified site: Secondary | ICD-10-CM

## 2010-09-15 MED ORDER — INDOMETHACIN 50 MG PO CAPS: 50.0000 mg | ORAL_CAPSULE | Freq: Three times a day (TID) | ORAL | Status: DC | PRN

## 2010-09-15 NOTE — Progress Notes (Signed)
  Subjective:    Patient ID: [name], male    DOB: [birth year], 65 y.o.   MRN: [id number]  HPI 65 year old patient who is seen today for followup. He has had a recent colonoscopy and had a number of questions related to this. All questions were addressed and answered. Since his colonoscopy he has had rare episodes of scanty bright red rectal bleeding. He is on daily aspirin therapy. As well as indomethacin when necessary. He has a history of weakness and fatigue. He is on chronic narcotic therapy related to osteoarthritis and chronic low back pain. He takes 4-8 oxycodone daily. He has a history of testicular hypofunction recent testosterone level greater than 500    Review of Systems  Constitutional: Positive for fatigue. Negative for fever, chills and appetite change.  HENT: Negative for hearing loss, ear pain, congestion, sore throat, trouble swallowing, neck stiffness, dental problem, voice change and tinnitus.   Eyes: Negative for pain, discharge and visual disturbance.  Respiratory: Negative for cough, chest tightness, wheezing and stridor.   Cardiovascular: Negative for chest pain, palpitations and leg swelling.  Gastrointestinal: Negative for nausea, vomiting, abdominal pain, diarrhea, constipation, blood in stool and abdominal distention.  Genitourinary: Negative for urgency, hematuria, flank pain, discharge, difficulty urinating and genital sores.  Musculoskeletal: Positive for back pain. Negative for myalgias, joint swelling, arthralgias and gait problem.  Skin: Negative for rash.  Neurological: Negative for dizziness, syncope, speech difficulty, weakness, numbness and headaches.  Hematological: Negative for adenopathy. Does not bruise/bleed easily.  Psychiatric/Behavioral: Negative for behavioral problems and dysphoric mood. The patient is not nervous/anxious.        Objective:   Physical Exam  Constitutional: He is oriented to person, place, and time. He appears  well-developed.  HENT:  Head: Normocephalic.  Right Ear: External ear normal.  Left Ear: External ear normal.  Eyes: Conjunctivae and EOM are normal.  Neck: Normal range of motion.  Cardiovascular: Normal rate and normal heart sounds.   Pulmonary/Chest: Breath sounds normal.  Abdominal: Bowel sounds are normal.  Musculoskeletal: Normal range of motion. He exhibits no edema and no tenderness.  Neurological: He is alert and oriented to person, place, and time.  Psychiatric: He has a normal mood and affect. His behavior is normal.          Assessment & Plan:   Chronic fatigue. Multifactorial related to chronic narcotic use and chronic pain syndrome Colonic polyps. Colonoscopy results discussed at length Chronic pain syndrome Chronic low back pain

## 2010-09-15 NOTE — Patient Instructions (Addendum)
It is important that you exercise regularly, at least 20 minutes 3 to 4 times per week.  If you develop chest pain or shortness of breath seek  medical attention.  Limit your sodium (Salt) intake  Return in 6 months for follow-up   Discontinue  Aspirin for 2 weeks

## 2010-10-26 ENCOUNTER — Other Ambulatory Visit: Payer: Self-pay | Admitting: Orthopaedic Surgery

## 2010-10-26 DIAGNOSIS — M7989 Other specified soft tissue disorders: Secondary | ICD-10-CM

## 2010-10-28 ENCOUNTER — Ambulatory Visit
Admission: RE | Admit: 2010-10-28 | Discharge: 2010-10-28 | Disposition: A | Discharge status: Home / Self Care | Payer: Medicare Other | Source: Ambulatory Visit | Attending: Orthopaedic Surgery | Admitting: Orthopaedic Surgery

## 2010-10-28 DIAGNOSIS — M7989 Other specified soft tissue disorders: Secondary | ICD-10-CM

## 2010-10-28 MED ORDER — GADOBENATE DIMEGLUMINE 529 MG/ML IV SOLN
20.0000 mL | Freq: Once | INTRAVENOUS | Status: AC | PRN
  Administered 2010-10-28: 20 mL via INTRAVENOUS

## 2010-11-18 ENCOUNTER — Ambulatory Visit (INDEPENDENT_AMBULATORY_CARE_PROVIDER_SITE_OTHER): Payer: Medicare Other | Admitting: Internal Medicine

## 2010-11-18 ENCOUNTER — Encounter: Payer: Self-pay | Admitting: Internal Medicine

## 2010-11-18 VITALS — BP 124/80 | Temp 98.3°F | Wt 230.0 lb

## 2010-11-18 DIAGNOSIS — E291 Testicular hypofunction: Secondary | ICD-10-CM

## 2010-11-18 DIAGNOSIS — R531 Weakness: Secondary | ICD-10-CM

## 2010-11-18 DIAGNOSIS — R5381 Other malaise: Secondary | ICD-10-CM

## 2010-11-18 DIAGNOSIS — M545 Low back pain: Secondary | ICD-10-CM

## 2010-11-18 DIAGNOSIS — M1990 Unspecified osteoarthritis, unspecified site: Secondary | ICD-10-CM

## 2010-11-18 NOTE — Patient Instructions (Signed)
Complete examination in 2 months as scheduled Follow up at the [facility]  Followup with the Gen. Surgery  Call or return to clinic prn if these symptoms worsen or fail to improve as anticipated.

## 2010-11-18 NOTE — Progress Notes (Signed)
  Subjective:    Patient ID: [name], male    DOB: 10-01-1945, 65 y.o.   MRN: [id number]  HPI  65 year old patient who is seen today for followup. Multiple complaints including chronic fatigue and weakness. There has been some modest weight gain since his last visit here he describe exercise intolerance he has a history of chronic pain osteoarthritis chronic low back pain and is on chronic narcotic analgesics. He is followed at the [facility] system. He states that he is giving himself a trial on lower dose narcotics and still this does not affect his chronic fatigue. He is inactive due to his arthritis and describes easy fatigability. He states that he has had chronic lower abdominal pain since hernia wrist surgery in 2010. He states that this has worsened more recently.  He was treated briefly for hypogonadism but this did not affect his chronic fatigue and followup testosterone level off therapy was greater than 500.    Review of Systems  Constitutional: Positive for fatigue. Negative for fever, chills and appetite change.  HENT: Negative for hearing loss, ear pain, congestion, sore throat, trouble swallowing, neck stiffness, dental problem, voice change and tinnitus.   Eyes: Negative for pain, discharge and visual disturbance.  Respiratory: Negative for cough, chest tightness, wheezing and stridor.   Cardiovascular: Negative for chest pain, palpitations and leg swelling.  Gastrointestinal: Positive for abdominal pain. Negative for nausea, vomiting, diarrhea, constipation, blood in stool and abdominal distention.  Genitourinary: Negative for urgency, hematuria, flank pain, discharge, difficulty urinating and genital sores.  Musculoskeletal: Positive for back pain and gait problem. Negative for myalgias, joint swelling and arthralgias.  Skin: Negative for rash.  Neurological: Positive for weakness. Negative for dizziness, syncope, speech difficulty, numbness and headaches.    Hematological: Negative for adenopathy. Does not bruise/bleed easily.  Psychiatric/Behavioral: Negative for behavioral problems and dysphoric mood. The patient is not nervous/anxious.        Objective:   Physical Exam  Constitutional: He is oriented to person, place, and time. He appears well-developed.  HENT:  Head: Normocephalic.  Right Ear: External ear normal.  Left Ear: External ear normal.  Eyes: Conjunctivae and EOM are normal.  Neck: Normal range of motion.  Cardiovascular: Normal rate and normal heart sounds.   Pulmonary/Chest: Breath sounds normal.  Abdominal: Bowel sounds are normal. He exhibits no distension.       Mild subjective tenderness in the lower abdominal region no guarding or rebound bowel sounds normal  Musculoskeletal: Normal range of motion. He exhibits no edema and no tenderness.  Neurological: He is alert and oriented to person, place, and time.  Psychiatric: He has a normal mood and affect. His behavior is normal.          Assessment & Plan:   Chronic pain syndrome Osteoarthritis Chronic fatigue. Multifactorial Abdominal pain. Suggested he follow up with his general surgeon  Return in 2 months for his annual exam as scheduled  Follow [facility] for

## 2010-11-22 ENCOUNTER — Encounter (INDEPENDENT_AMBULATORY_CARE_PROVIDER_SITE_OTHER): Payer: Self-pay | Admitting: Surgery

## 2010-11-23 ENCOUNTER — Encounter (INDEPENDENT_AMBULATORY_CARE_PROVIDER_SITE_OTHER): Payer: Self-pay | Admitting: Surgery

## 2010-11-23 ENCOUNTER — Ambulatory Visit (INDEPENDENT_AMBULATORY_CARE_PROVIDER_SITE_OTHER): Payer: Medicare Other | Admitting: Surgery

## 2010-11-23 DIAGNOSIS — R109 Unspecified abdominal pain: Secondary | ICD-10-CM

## 2010-11-23 LAB — CREATININE, SERUM: Creat: 1.04 mg/dL (ref 0.50–1.35)

## 2010-11-23 LAB — BUN: BUN: 17 mg/dL (ref 6–23)

## 2010-11-23 NOTE — Patient Instructions (Signed)
MRI did not show abdominal wall.  Will get CT scan of abdomen and pelvis.  Will call results.

## 2010-11-23 NOTE — Progress Notes (Addendum)
Chief Complaint  Patient presents with  . Other    PO RIH    [name] is a 65 y.o. (DOB: February 03, 1946)  white male who is a patient of Rogelia Boga, MD and comes to me today for persistent abdominal pain/muscle spasms/discomfort.  I did a right inguinal hernia repair laparoscopically on 10/29/2008.  The patient postoperatively had 3 bouts of infections involving his urinary tract. He saw Dr. Larey Dresser for the urinary tract infections. These have since resolved and he is not see Dr. Shiela Mayer back.  He had a persistently sore lower abdomen since his hernia surgery 2 years ago, he complains of a cordlike feeling across his lower abdomen, the pain does limit his physical activity, and seems unrelated to eating. He's had no nausea or vomiting, no fever, no change in bowel habits.  He's also had some back problems and has seen Dr. Hurshel Keys. He had an MRI of his back in July 2012 that showed degenerative changes of his lumbar spine, but no surgical issues.  The patient clearly frustrated by his chronic abdominal pain that he dates from the laparoscopic hernia repair. I did obtain a CT scan of his abdomen on 22 March 2009 which was negative.  Past Medical History  Diagnosis Date  . ALLERGIC RHINITIS 06/17/2009  . GOUT 05/04/2008  . LOW BACK PAIN 05/04/2008  . OSTEOARTHRITIS 05/04/2008  . PULMONARY NODULE, LEFT LOWER LOBE 01/04/2010  . TESTICULAR HYPOFUNCTION 01/04/2010  . WEIGHT LOSS 03/17/2009  . Night sweats   . Abdominal pain     Past Surgical History  Procedure Date  . Colostomy   . Knee surgery     3 on each knee  . Ankle surgery     4 times  . Elbow surgery     torn tendon  . Tonsillectomy and adenoidectomy   . Inguinal hernia repair     right    Current Outpatient Prescriptions  Medication Sig Dispense Refill  . allopurinol (ZYLOPRIM) 100 MG tablet Take 100 mg by mouth daily. 5 once daily       . aspirin 325 MG tablet Take 325 mg by mouth daily.          . Clobetasol Prop Emollient Base 0.05 % emollient cream Apply topically as needed.        . colchicine 0.6 MG tablet Take 0.6 mg by mouth as needed. gout      . ibuprofen (ADVIL,MOTRIN) 600 MG tablet Take 600 mg by mouth every 8 (eight) hours as needed.        . indomethacin (INDOCIN) 50 MG capsule Take 1 capsule (50 mg total) by mouth 3 (three) times daily as needed.  90 capsule  1  . methocarbamol (ROBAXIN) 750 MG tablet Take 750 mg by mouth as needed.        Marland Kitchen oxyCODONE (ROXICODONE) 5 MG immediate release tablet Take 5 mg by mouth every 4 (four) hours as needed. Takes 2 every 4 hours as needed for pain in back      . pediatric multivitamin with iron (AQUADEKS) LIQD Take by mouth daily.        Marland Kitchen terazosin (HYTRIN) 5 MG capsule Take 1 capsule by mouth At bedtime as needed.        No Known Allergies  PHYSICAL EXAM: BP 144/78  Pulse 84  Temp(Src) 97.4 F (36.3 C) (Temporal)  Ht 6\' 2"  (1.88 m)  Wt 223 lb 12.8 oz (101.515 kg)  BMI 28.73 kg/m2  HEENT: Normal. Pupils equal. Normal dentition. Neck: Supple. No thyroid mass. Lymph Nodes:  No supraclavicular or cervical nodes. Lungs: Clear and symmetric. Heart:  RRR. No murmur.  Abdomen: I examined him in both a standing and laying position.  He points to pain at the site of the laparoscopic trocars, across his lower abdomen, and up both sides to his upper abdomen.  But I feel no mass. No hernia.  His right inguinal repair appears intact. Normal bowel sounds.  Extremities:  Good strength in upper and lower extremities. Neurologic:  Grossly intact to motor and sensory function.  DATA REVIEWED: I gave him a copy of his MRI report from July 2012.  ASSESSMENT and PLAN: 1.  Chronic abdominal pain - post lap right inguinal hernia repair.  The patient is worried about infection or allergies to the mesh.  He has no clinical evidence of either.  But I am at a loss as to explain his pain.  He has no physical findings.  Some of his pain is remote  from the inguinal hernia repair.  And actually, he has limited discomfort over where the hernia is.    I think it is worth repeating the CT scan of his abdomen.  I think it is unlikely to show anything, but will rule out some unrecognized problem.  I told him I would call him about his results.      [CT abd/pelvis on 11/28/10 was neg.  Specifically, there was no abnormality around the right inguinal mesh.  Discussed with patient.  Two possible options:  PT or second opinion.  DN 11/29/10]  2.  Lumbar disk degeneration.  He is receiving PT/electrical stimulation/deep muscle massage for his back.  There is no planned surgery.

## 2010-11-25 ENCOUNTER — Telehealth (INDEPENDENT_AMBULATORY_CARE_PROVIDER_SITE_OTHER): Payer: Self-pay

## 2010-11-25 NOTE — Telephone Encounter (Signed)
[name] called to request that they do the CT with contrast only and not without as to reduce the amount of radiation.  I paged Dr Ezzard Standing and he gave the order to do that.  I called back and left [name] a message

## 2010-11-28 ENCOUNTER — Ambulatory Visit
Admission: RE | Admit: 2010-11-28 | Discharge: 2010-11-28 | Disposition: A | Discharge status: Home / Self Care | Payer: Medicare Other | Source: Ambulatory Visit | Attending: Surgery | Admitting: Surgery

## 2010-11-28 MED ORDER — IOHEXOL 300 MG/ML  SOLN: 125.0000 mL | Freq: Once | INTRAMUSCULAR | Status: AC | PRN

## 2010-12-08 ENCOUNTER — Encounter (INDEPENDENT_AMBULATORY_CARE_PROVIDER_SITE_OTHER): Payer: Medicare Other | Admitting: Surgery

## 2011-01-05 ENCOUNTER — Encounter: Payer: Medicare Other | Admitting: Internal Medicine

## 2011-01-06 ENCOUNTER — Ambulatory Visit (INDEPENDENT_AMBULATORY_CARE_PROVIDER_SITE_OTHER): Payer: Medicare Other | Admitting: Internal Medicine

## 2011-01-06 ENCOUNTER — Encounter: Payer: Self-pay | Admitting: Internal Medicine

## 2011-01-06 VITALS — BP 110/72 | HR 84 | Temp 97.8°F | Resp 18 | Ht 72.5 in | Wt 222.0 lb

## 2011-01-06 DIAGNOSIS — Z0000 Encounter for general adult medical examination without abnormal findings: Secondary | ICD-10-CM

## 2011-01-06 DIAGNOSIS — M109 Gout, unspecified: Secondary | ICD-10-CM

## 2011-01-06 DIAGNOSIS — J309 Allergic rhinitis, unspecified: Secondary | ICD-10-CM

## 2011-01-06 DIAGNOSIS — Z86010 Personal history of colonic polyps: Secondary | ICD-10-CM

## 2011-01-06 DIAGNOSIS — R634 Abnormal weight loss: Secondary | ICD-10-CM

## 2011-01-06 DIAGNOSIS — Z136 Encounter for screening for cardiovascular disorders: Secondary | ICD-10-CM

## 2011-01-06 DIAGNOSIS — M545 Low back pain: Secondary | ICD-10-CM

## 2011-01-06 DIAGNOSIS — J984 Other disorders of lung: Secondary | ICD-10-CM

## 2011-01-06 LAB — CBC WITH DIFFERENTIAL/PLATELET
Basophils Absolute: 0 K/uL (ref 0.0–0.1)
Basophils Relative: 0.8 % (ref 0.0–3.0)
Eosinophils Absolute: 0.1 K/uL (ref 0.0–0.7)
Eosinophils Relative: 2.4 % (ref 0.0–5.0)
HCT: 40 % (ref 39.0–52.0)
Hemoglobin: 13.7 g/dL (ref 13.0–17.0)
Lymphocytes Relative: 40.7 % (ref 12.0–46.0)
Lymphs Abs: 1.6 K/uL (ref 0.7–4.0)
MCHC: 34.3 g/dL (ref 30.0–36.0)
MCV: 100.7 fl — ABNORMAL HIGH (ref 78.0–100.0)
Monocytes Absolute: 0.4 K/uL (ref 0.1–1.0)
Monocytes Relative: 9.1 % (ref 3.0–12.0)
Neutro Abs: 1.8 K/uL (ref 1.4–7.7)
Neutrophils Relative %: 47 % (ref 43.0–77.0)
Platelets: 196 K/uL (ref 150.0–400.0)
RBC: 3.97 Mil/uL — ABNORMAL LOW (ref 4.22–5.81)
RDW: 12.8 % (ref 11.5–14.6)
WBC: 3.8 K/uL — ABNORMAL LOW (ref 4.5–10.5)

## 2011-01-06 LAB — COMPREHENSIVE METABOLIC PANEL
ALT: 32 U/L (ref 0–53)
AST: 59 U/L — ABNORMAL HIGH (ref 0–37)
Albumin: 3.4 g/dL — ABNORMAL LOW (ref 3.5–5.2)
Alkaline Phosphatase: 92 U/L (ref 39–117)
BUN: 10 mg/dL (ref 6–23)
CO2: 29 mEq/L (ref 19–32)
Calcium: 8.6 mg/dL (ref 8.4–10.5)
Chloride: 99 mEq/L (ref 96–112)
Creatinine, Ser: 0.9 mg/dL (ref 0.4–1.5)
GFR: 87.61 mL/min (ref >60.00)
Glucose, Bld: 106 mg/dL — ABNORMAL HIGH (ref 70–99)
Potassium: 4.3 mEq/L (ref 3.5–5.1)
Sodium: 137 mEq/L (ref 135–145)
Total Bilirubin: 0.7 mg/dL (ref 0.3–1.2)
Total Protein: 6.7 g/dL (ref 6.0–8.3)

## 2011-01-06 LAB — LIPID PANEL
Cholesterol: 237 mg/dL — ABNORMAL HIGH (ref 0–200)
HDL: 37.7 mg/dL — ABNORMAL LOW
Total CHOL/HDL Ratio: 6
Triglycerides: 904 mg/dL — ABNORMAL HIGH (ref 0.0–149.0)
VLDL: 180.8 mg/dL — ABNORMAL HIGH (ref 0.0–40.0)

## 2011-01-06 LAB — URIC ACID: Uric Acid, Serum: 6.8 mg/dL (ref 4.0–7.8)

## 2011-01-06 LAB — TSH: TSH: 2.35 u[IU]/mL (ref 0.35–5.50)

## 2011-01-06 LAB — LDL CHOLESTEROL, DIRECT: Direct LDL: 59.3 mg/dL

## 2011-01-06 NOTE — Progress Notes (Signed)
Subjective:    Patient ID: [name], male    DOB: [birth year], 65 y.o.   MRN: [id number]  HPI CC: cpx - doing well.   History of Present Illness:   65 year-old patient who is seen today for an annual exam. He is followed closely by urology and had an abdominal CT scan performed recently showed a 6-mm left lower lobe nodule. The patient states that he had had a CT scan 6 to 8 months prior and was due for follow-up in December of this year. He states that the 6-mm nodule is unchanged compared to his initial CT scan. He is low risk and has been a nonsmoker. His mother did die at 10 of lung cancer and was also a nonsmoker. He is scheduled for a colonoscopy in the near future. He has a history of gout, osteoarthritis, and chronic low back pain. He has had a recent urological evaluation as well as a general surgical evaluation. He has had a followup abdominal and pelvic CT due to chronic lower abdominal pain Allergies (verified):   No Known Drug Allergies   Past History:  Past Medical History:  Low back pain  Osteoarthritis  bilateral knee pain  Gout  Allergic rhinitis   6-mm left lower lobe pulmonary nodule   Past Surgical History:  multiple knee surgeries bilaterally from the 1970s through the 1990s; 3 on the left and 3 on the right  colonoscopy ([facility]) estimated 8 to 10 years ago,  6/12 status post right inguinal hernia repair July 2010   Family History:  Reviewed history from 05/04/2008 and no changes required.  father died age 30;  mother, age 58- died lung ca age 11  One sister in good health   Social History:  Reviewed history from 05/04/2008 and no changes required.  Retired  Married  Never Smoked  two adult children   1. Risk factors, based on past  M,S,F history cardiovascular risk factors- none  2.  Physical activities: No major limitations although somewhat limited due to bilateral knee and back pain  3.  Depression/mood: No history of depression or mood  disorder although he does have chronic pain 4.  Hearing: No deficits  5.  ADL's: Independent in all aspects of daily living  6.  Fall risk: low 7.  Home safety: No problems identified 8.  Height weight, and visual acuity; height and weight stable no change in visual acuity  9.  Counseling: Low salt diet regular exercise are encouraged  10. Lab orders based on risk factors: Laboratory profile will be reviewed. He has had a recent PSA  11. Referral : Followup urology and orthopedics  12. Care plan: The patient wishes to have a pneumococcal vaccine at the [facility] 13. Cognitive assessment: Alert and oriented with normal affect      .    Review of Systems  Constitutional: Negative for fever, chills, activity change, appetite change and fatigue.  HENT: Negative for hearing loss, ear pain, congestion, rhinorrhea, sneezing, mouth sores, trouble swallowing, neck pain, neck stiffness, dental problem, voice change, sinus pressure and tinnitus.   Eyes: Negative for photophobia, pain, redness and visual disturbance.  Respiratory: Negative for apnea, cough, choking, chest tightness, shortness of breath and wheezing.   Cardiovascular: Negative for chest pain, palpitations and leg swelling.  Gastrointestinal: Positive for abdominal pain. Negative for nausea, vomiting, diarrhea, constipation, blood in stool, abdominal distention, anal bleeding and rectal pain.  Genitourinary: Negative for dysuria, urgency, frequency, hematuria, flank pain, decreased  urine volume, discharge, penile swelling, scrotal swelling, difficulty urinating, genital sores and testicular pain.  Musculoskeletal: Positive for back pain, joint swelling and gait problem. Negative for myalgias and arthralgias.  Skin: Negative for color change, rash and wound.  Neurological: Negative for dizziness, tremors, seizures, syncope, facial asymmetry, speech difficulty, weakness, light-headedness, numbness and headaches.    Hematological: Negative for adenopathy. Does not bruise/bleed easily.  Psychiatric/Behavioral: Negative for suicidal ideas, hallucinations, behavioral problems, confusion, sleep disturbance, self-injury, dysphoric mood, decreased concentration and agitation. The patient is not nervous/anxious.        Objective:   Physical Exam  Constitutional: He appears well-developed and well-nourished.  HENT:  Head: Normocephalic and atraumatic.  Right Ear: External ear normal.  Left Ear: External ear normal.  Nose: Nose normal.  Mouth/Throat: Oropharynx is clear and moist.  Eyes: Conjunctivae and EOM are normal. Pupils are equal, round, and reactive to light. No scleral icterus.  Neck: Normal range of motion. Neck supple. No JVD present. No thyromegaly present.  Cardiovascular: Regular rhythm, normal heart sounds and intact distal pulses.  Exam reveals no gallop and no friction rub.   No murmur heard. Pulmonary/Chest: Effort normal and breath sounds normal. He exhibits no tenderness.  Abdominal: Soft. Bowel sounds are normal. He exhibits no distension and no mass. There is no tenderness.  Genitourinary: Penis normal.  Musculoskeletal: Normal range of motion. He exhibits no edema and no tenderness.       Multiple scars about both knees  Lymphadenopathy:    He has no cervical adenopathy.  Neurological: He is alert. He has normal reflexes. No cranial nerve deficit. Coordination normal.  Skin: Skin is warm and dry. No rash noted.  Psychiatric: He has a normal mood and affect. His behavior is normal.          Assessment & Plan:    Preventive health examination  Osteoarthritis Chronic low back pain History of gout Allergic rhinitis   History of testosterone insufficiency. He has been treated in the past however his most recent testosterone level off treatment was in excess of 600  Recheck in one year or as needed

## 2011-01-06 NOTE — Patient Instructions (Signed)
It is important that you exercise regularly, at least 20 minutes 3 to 4 times per week.  If you develop chest pain or shortness of breath seek  medical attention.  Limit your sodium (Salt) intake  Return in one year for follow-up  

## 2011-01-18 LAB — DIFFERENTIAL
Basophils Absolute: 0
Basophils Relative: 0
Eosinophils Absolute: 0.1
Eosinophils Relative: 1
Lymphocytes Relative: 18
Lymphs Abs: 1.1
Monocytes Absolute: 0.4
Monocytes Relative: 6
Neutro Abs: 4.7
Neutrophils Relative %: 75

## 2011-01-18 LAB — I-STAT 8, (EC8 V) (CONVERTED LAB)
Acid-Base Excess: 2
BUN: 10
Bicarbonate: 27 — ABNORMAL HIGH
Chloride: 104
Glucose, Bld: 122 — ABNORMAL HIGH
HCT: 39
Hemoglobin: 13.3
Operator id: 196461
Potassium: 4
Sodium: 136
TCO2: 28
pCO2, Ven: 42.1 — ABNORMAL LOW
pH, Ven: 7.415 — ABNORMAL HIGH

## 2011-01-18 LAB — CBC
HCT: 37 — ABNORMAL LOW
Hemoglobin: 13.2
MCHC: 35.6
MCV: 97.2
Platelets: 249
RBC: 3.8 — ABNORMAL LOW
RDW: 13.4
WBC: 6.2

## 2011-01-18 LAB — POCT CARDIAC MARKERS
CKMB, poc: 1.8
Myoglobin, poc: 159
Operator id: 151321
Troponin i, poc: <0.05

## 2011-01-18 LAB — POCT I-STAT CREATININE
Creatinine, Ser: 0.9
Operator id: 196461

## 2011-01-18 LAB — TSH: TSH: 1.327

## 2011-05-23 ENCOUNTER — Encounter: Payer: Self-pay | Admitting: Internal Medicine

## 2011-05-23 ENCOUNTER — Ambulatory Visit (INDEPENDENT_AMBULATORY_CARE_PROVIDER_SITE_OTHER): Payer: Medicare Other | Admitting: Internal Medicine

## 2011-05-23 DIAGNOSIS — R5381 Other malaise: Secondary | ICD-10-CM

## 2011-05-23 DIAGNOSIS — J984 Other disorders of lung: Secondary | ICD-10-CM

## 2011-05-23 DIAGNOSIS — M1990 Unspecified osteoarthritis, unspecified site: Secondary | ICD-10-CM

## 2011-05-23 DIAGNOSIS — R5383 Other fatigue: Secondary | ICD-10-CM

## 2011-05-23 DIAGNOSIS — R531 Weakness: Secondary | ICD-10-CM

## 2011-05-23 DIAGNOSIS — I2584 Coronary atherosclerosis due to calcified coronary lesion: Secondary | ICD-10-CM

## 2011-05-23 DIAGNOSIS — M545 Low back pain: Secondary | ICD-10-CM

## 2011-05-23 DIAGNOSIS — R634 Abnormal weight loss: Secondary | ICD-10-CM

## 2011-05-23 DIAGNOSIS — I2510 Atherosclerotic heart disease of native coronary artery without angina pectoris: Secondary | ICD-10-CM

## 2011-05-23 DIAGNOSIS — E291 Testicular hypofunction: Secondary | ICD-10-CM

## 2011-05-23 NOTE — Progress Notes (Signed)
Subjective:    Patient ID: [name], male    DOB: [birth year], 66 y.o.   MRN: [id number]  HPI  66 -year-old patient who is seen today in followup.  Complaints include anorexia and weight loss since his last visit here his weight has dropped 9 pounds; he has a history of weight loss as well as chronic fatigue. He is followed at [facility] and is on chronic narcotic analgesics. He has a history of a pulmonary nodule but followup chest CT revealed no progression and was most consistent with a area of scarring. He is a fairly recent abdominal and pelvic CT scans revealed no significant pathology. He has had a recent colonoscopy. He also complains of some lower abdominal discomfort as well his right groin discomfort following hernia surgery. He is a lifelong nonsmoker but states he consumes approximately 4 beers per day. He has chronic low back pain osteoarthritis. In addition to knee pain he also has some shoulder pain. He has a history of testosterone deficiency but a testosterone level is normal several months following discontinuation of therapy. Laboratory studies in October revealed mild macrocytosis leukopenia and an albumin of 3.4 AST was slightly elevated  Wt Readings from Last 3 Encounters:  05/23/11 213 lb (96.616 kg)  01/06/11 222 lb (100.699 kg)  11/23/10 223 lb 12.8 oz (101.515 kg)     Review of Systems  Constitutional: Positive for chills, activity change, fatigue and unexpected weight change. Negative for fever and appetite change.  HENT: Positive for rhinorrhea and postnasal drip. Negative for hearing loss, ear pain, congestion, sneezing, mouth sores, trouble swallowing, neck pain, neck stiffness, dental problem, voice change, sinus pressure and tinnitus.   Eyes: Negative for photophobia, pain, redness and visual disturbance.  Respiratory: Negative for apnea, cough, choking, chest tightness, shortness of breath and wheezing.   Cardiovascular: Negative for chest pain,  palpitations and leg swelling.  Gastrointestinal: Positive for abdominal distention. Negative for nausea, vomiting, abdominal pain, diarrhea, constipation, blood in stool, anal bleeding and rectal pain.       Lower mid and right lower quadrant pain. The right lower quadrant and groin pain have been present since hernia surgery  Genitourinary: Negative for dysuria, urgency, frequency, hematuria, flank pain, decreased urine volume, discharge, penile swelling, scrotal swelling, difficulty urinating, genital sores and testicular pain.  Musculoskeletal: Positive for back pain and arthralgias. Negative for myalgias, joint swelling and gait problem.  Skin: Negative for color change, rash and wound.  Neurological: Positive for weakness. Negative for dizziness, tremors, seizures, syncope, facial asymmetry, speech difficulty, light-headedness, numbness and headaches.  Hematological: Negative for adenopathy. Does not bruise/bleed easily.  Psychiatric/Behavioral: Negative for suicidal ideas, hallucinations, behavioral problems, confusion, sleep disturbance, self-injury, dysphoric mood, decreased concentration and agitation. The patient is not nervous/anxious.        Objective:   Physical Exam  Constitutional: He appears well-developed and well-nourished.  HENT:  Head: Normocephalic and atraumatic.  Right Ear: External ear normal.  Left Ear: External ear normal.  Nose: Nose normal.  Mouth/Throat: Oropharynx is clear and moist.  Eyes: Conjunctivae and EOM are normal. Pupils are equal, round, and reactive to light. No scleral icterus.  Neck: Normal range of motion. Neck supple. No JVD present. No thyromegaly present.  Cardiovascular: Regular rhythm, normal heart sounds and intact distal pulses.  Exam reveals no gallop and no friction rub.   No murmur heard. Pulmonary/Chest: Effort normal and breath sounds normal. He exhibits no tenderness.  Abdominal: Soft. Bowel sounds are normal. He  exhibits no  distension and no mass. There is no tenderness.       Mild right lower quadrant tenderness without mass effect  Genitourinary: Rectum normal, prostate normal and penis normal. Guaiac negative stool.  Musculoskeletal: Normal range of motion. He exhibits no edema and no tenderness.  Lymphadenopathy:    He has no cervical adenopathy.  Neurological: He is alert. He has normal reflexes. No cranial nerve deficit. Coordination normal.  Skin: Skin is warm and dry. No rash noted.  Psychiatric: He has a normal mood and affect. His behavior is normal.          Assessment & Plan:   Weight loss. Patient has had 9 pounds of weight loss since October although still remains overweight. He complains of anorexia and fatigue.  He has had a recent colonoscopy and CT of the abdomen and pelvis done revealing in August of last year  Chronic low back pain on chronic narcotic analgesics History of mild macrocytosis elevated AST and mildly depressed albumin. We'll repeat CT abdomen revealed mild fatty liver History of testosterone deficiency. We'll check a testosterone level

## 2011-05-23 NOTE — Patient Instructions (Signed)
Limit your sodium (Salt) intake    It is important that you exercise regularly, at least 20 minutes 3 to 4 times per week.  If you develop chest pain or shortness of breath seek  medical attention.  Return in one month for follow-up  

## 2011-05-24 LAB — CBC WITH DIFFERENTIAL/PLATELET
Basophils Absolute: 0 10*3/uL (ref 0.0–0.1)
Basophils Relative: 0.7 % (ref 0.0–3.0)
Eosinophils Absolute: 0.1 10*3/uL (ref 0.0–0.7)
Eosinophils Relative: 1.7 % (ref 0.0–5.0)
HCT: 38.4 % — ABNORMAL LOW (ref 39.0–52.0)
Hemoglobin: 13.2 g/dL (ref 13.0–17.0)
Lymphocytes Relative: 35.2 % (ref 12.0–46.0)
Lymphs Abs: 1.4 10*3/uL (ref 0.7–4.0)
MCHC: 34.4 g/dL (ref 30.0–36.0)
MCV: 99.8 fl (ref 78.0–100.0)
Monocytes Absolute: 0.4 10*3/uL (ref 0.1–1.0)
Monocytes Relative: 10 % (ref 3.0–12.0)
Neutro Abs: 2.1 10*3/uL (ref 1.4–7.7)
Neutrophils Relative %: 52.4 % (ref 43.0–77.0)
Platelets: 202 10*3/uL (ref 150.0–400.0)
RBC: 3.84 Mil/uL — ABNORMAL LOW (ref 4.22–5.81)
RDW: 13.2 % (ref 11.5–14.6)
WBC: 4.1 10*3/uL — ABNORMAL LOW (ref 4.5–10.5)

## 2011-05-24 LAB — COMPREHENSIVE METABOLIC PANEL
ALT: 36 U/L (ref 0–53)
AST: 48 U/L — ABNORMAL HIGH (ref 0–37)
Albumin: 3.5 g/dL (ref 3.5–5.2)
Alkaline Phosphatase: 90 U/L (ref 39–117)
BUN: 10 mg/dL (ref 6–23)
CO2: 29 mEq/L (ref 19–32)
Calcium: 9 mg/dL (ref 8.4–10.5)
Chloride: 101 mEq/L (ref 96–112)
Creatinine, Ser: 1 mg/dL (ref 0.4–1.5)
GFR: 84.33 mL/min (ref >60.00)
Glucose, Bld: 100 mg/dL — ABNORMAL HIGH (ref 70–99)
Potassium: 4.6 mEq/L (ref 3.5–5.1)
Sodium: 139 mEq/L (ref 135–145)
Total Bilirubin: 0.6 mg/dL (ref 0.3–1.2)
Total Protein: 6.8 g/dL (ref 6.0–8.3)

## 2011-05-24 LAB — TESTOSTERONE: Testosterone: 363.69 ng/dL (ref 350.00–890.00)

## 2011-05-24 LAB — SEDIMENTATION RATE: Sed Rate: 30 mm/hr — ABNORMAL HIGH (ref 0–22)

## 2011-05-29 ENCOUNTER — Telehealth: Payer: Self-pay | Admitting: Internal Medicine

## 2011-05-29 NOTE — Telephone Encounter (Signed)
Pt called and is req to get lab results. Pls call.

## 2011-05-29 NOTE — Telephone Encounter (Signed)
Spoke with pt - informed of labs - copy sent to home to take to [place]

## 2011-05-29 NOTE — Telephone Encounter (Signed)
Please call/notify patient that lab/test/procedure is normal 

## 2011-05-29 NOTE — Telephone Encounter (Signed)
Please advise 

## 2011-06-01 ENCOUNTER — Telehealth: Payer: Self-pay | Admitting: Internal Medicine

## 2011-06-01 NOTE — Telephone Encounter (Signed)
Pt called and is req a call back from [place] re: lab results. Pt has some questions.

## 2011-06-01 NOTE — Telephone Encounter (Signed)
Spoke with pt- discussed labs, recv'd copy in mail and had questions about all abnormal - i apptempted to explain but he still would like to discuss with you.  Please call  [phone]

## 2011-06-20 ENCOUNTER — Encounter: Payer: Self-pay | Admitting: Internal Medicine

## 2011-06-20 ENCOUNTER — Ambulatory Visit (INDEPENDENT_AMBULATORY_CARE_PROVIDER_SITE_OTHER): Payer: Medicare Other | Admitting: Internal Medicine

## 2011-06-20 VITALS — BP 138/80 | Temp 97.7°F | Wt 210.0 lb

## 2011-06-20 DIAGNOSIS — R634 Abnormal weight loss: Secondary | ICD-10-CM

## 2011-06-20 DIAGNOSIS — R5383 Other fatigue: Secondary | ICD-10-CM

## 2011-06-20 DIAGNOSIS — R531 Weakness: Secondary | ICD-10-CM

## 2011-06-20 DIAGNOSIS — M1990 Unspecified osteoarthritis, unspecified site: Secondary | ICD-10-CM

## 2011-06-20 DIAGNOSIS — M545 Low back pain, unspecified: Secondary | ICD-10-CM

## 2011-06-20 DIAGNOSIS — R5381 Other malaise: Secondary | ICD-10-CM

## 2011-06-20 NOTE — Progress Notes (Signed)
  Subjective:    Patient ID: [name], male    DOB: 1945/12/07, 66 y.o.   MRN: [id number]  HPI  66 year old patient who is seen today for followup. He continues to have concerns with weight loss and his weight is down an additional 3 pounds over the past month. He states that he has been consuming considerable calories in an attempt to maintain his weight. He states that his alcohol consumption is up to 6 beers daily to assist with weight control. He has chronic low back pain and arthritis and is on chronic narcotic analgesics. He has approximately 4 cups of coffee throughout the day.    Review of Systems  Constitutional: Positive for appetite change, fatigue and unexpected weight change.  Neurological: Positive for weakness.       Objective:   Physical Exam  Constitutional: He is oriented to person, place, and time. He appears well-developed and well-nourished. No distress.       No distress. Remains slightly overweight  HENT:  Head: Normocephalic.  Right Ear: External ear normal.  Left Ear: External ear normal.  Eyes: Conjunctivae and EOM are normal.  Neck: Normal range of motion.  Cardiovascular: Normal rate and normal heart sounds.   Pulmonary/Chest: Breath sounds normal.  Abdominal: Bowel sounds are normal.  Musculoskeletal: Normal range of motion. He exhibits no edema and no tenderness.  Neurological: He is alert and oriented to person, place, and time.  Psychiatric: He has a normal mood and affect. His behavior is normal.          Assessment & Plan:   Weight loss  Anorexia Chronic low back pain  He will attempt to minimize his medications especially narcotics. He was told to limit alcohol to a minimum of 2 drinks daily and to moderate his caffeine use. I believe that he will stabilize on less medications. Recheck in 6 weeks

## 2011-06-20 NOTE — Patient Instructions (Signed)
It is important that you exercise regularly, at least 20 minutes 3 to 4 times per week.  If you develop chest pain or shortness of breath seek  medical attention.  Attempt  to moderate your medications as much as possible  Moderate alcohol and caffeine use

## 2011-07-13 ENCOUNTER — Ambulatory Visit (INDEPENDENT_AMBULATORY_CARE_PROVIDER_SITE_OTHER): Payer: Medicare Other | Admitting: Family

## 2011-07-13 ENCOUNTER — Encounter: Payer: Self-pay | Admitting: Family

## 2011-07-13 VITALS — BP 138/76 | Temp 98.1°F | Wt 210.0 lb

## 2011-07-13 DIAGNOSIS — R5381 Other malaise: Secondary | ICD-10-CM

## 2011-07-13 DIAGNOSIS — R197 Diarrhea, unspecified: Secondary | ICD-10-CM

## 2011-07-13 DIAGNOSIS — R5383 Other fatigue: Secondary | ICD-10-CM

## 2011-07-13 DIAGNOSIS — R634 Abnormal weight loss: Secondary | ICD-10-CM

## 2011-07-13 LAB — TSH: TSH: 0.97 u[IU]/mL (ref 0.35–5.50)

## 2011-07-13 NOTE — Progress Notes (Signed)
Subjective:    Patient ID: [name], male    DOB: 1945/05/07, 66 y.o.   MRN: [id number]  HPI Comments: 66yo white male is in today with c/o weight loss, diarrhea, lower abdominal pain, and fatigue x 6 months. He has lost about 12 lbs in 6 months and believes he has Celiac Disease. He has seen a gastroenterologist, had CT scans and colonoscopy that were all normal. Denies any emotional disturbance. He is pretty adamant today about having celiac testing.     Review of Systems  Constitutional: Positive for fatigue and unexpected weight change.  HENT: Negative.   Respiratory: Negative.   Cardiovascular: Negative.   Gastrointestinal:       Lower abdominal pain  Genitourinary: Negative.   Neurological: Negative.   Hematological: Negative.   Psychiatric/Behavioral: Negative.    Past Medical History  Diagnosis Date  . ALLERGIC RHINITIS 06/17/2009  . GOUT 05/04/2008  . LOW BACK PAIN 05/04/2008  . OSTEOARTHRITIS 05/04/2008  . PULMONARY NODULE, LEFT LOWER LOBE 01/04/2010  . TESTICULAR HYPOFUNCTION 01/04/2010  . WEIGHT LOSS 03/17/2009  . Night sweats   . Abdominal pain     History   Social History  . Marital Status: Married    Spouse Name: N/A    Number of Children: N/A  . Years of Education: N/A   Occupational History  . Not on file.   Social History Main Topics  . Smoking status: Never Smoker   . Smokeless tobacco: Never Used  . Alcohol Use: 5.0 oz/week    10 drink(s) per week  . Drug Use: No  . Sexually Active: Not on file   Other Topics Concern  . Not on file   Social History Narrative  . No narrative on file    Past Surgical History  Procedure Date  . Colostomy   . Knee surgery     3 on each knee  . Ankle surgery     4 times  . Elbow surgery     torn tendon  . Tonsillectomy and adenoidectomy   . Inguinal hernia repair     right    No family history on file.  No Known Allergies  Current Outpatient Prescriptions on File Prior to Visit  Medication  Sig Dispense Refill  . allopurinol (ZYLOPRIM) 100 MG tablet Take 100 mg by mouth daily. 5 once daily       . aspirin 325 MG tablet Take 325 mg by mouth daily.        . Clobetasol Prop Emollient Base 0.05 % emollient cream Apply topically as needed.        . colchicine 0.6 MG tablet Take 0.6 mg by mouth as needed. gout      . flunisolide (NASALIDE) 0.025 % SOLN       . fluocinonide ointment (LIDEX) 0.05 %       . ibuprofen (ADVIL,MOTRIN) 600 MG tablet Take 600 mg by mouth every 8 (eight) hours as needed.        . indomethacin (INDOCIN) 50 MG capsule Take 1 capsule (50 mg total) by mouth 3 (three) times daily as needed.  90 capsule  1  . methocarbamol (ROBAXIN) 750 MG tablet Take 750 mg by mouth as needed.        . Multiple Vitamin (MULTIVITAMIN) tablet Take 1 tablet by mouth daily.      Marland Kitchen oxyCODONE (ROXICODONE) 5 MG immediate release tablet Take 5 mg by mouth every 4 (four) hours as needed. Takes 2 every 4  hours as needed for pain in back      . solifenacin (VESICARE) 5 MG tablet Take 10 mg by mouth daily.        BP 138/76  Temp(Src) 98.1 F (36.7 C) (Oral)  Wt 210 lb (95.255 kg)chart    Objective:   Physical Exam  Constitutional: He is oriented to person, place, and time. He appears well-developed and well-nourished.  Neck: Normal range of motion. Neck supple.  Cardiovascular: Normal rate and normal heart sounds.   Pulmonary/Chest: Effort normal and breath sounds normal.  Abdominal: Soft. Bowel sounds are normal.  Musculoskeletal: Normal range of motion.  Neurological: He is alert and oriented to person, place, and time.  Skin: Skin is warm and dry.  Psychiatric: He has a normal mood and affect.          Assessment & Plan:  Assessment: Weight loss, fatigue, diarrhea  Plan: Celiac panel sent and TSH. All other labs suggestive of celiac disease have been sent previously. We'll followup pending his lab results. Follow with PCP as scheduled.

## 2011-07-14 LAB — CELIAC PANEL 10
Endomysial Screen: NEGATIVE
Gliadin IgA: 12.6 U/mL (ref <20)
Gliadin IgG: 1.8 U/mL (ref <20)
IgA: 119 mg/dL (ref 68–379)
Tissue Transglut Ab: 2.9 U/mL (ref <20)
Tissue Transglutaminase Ab, IgA: 2.8 U/mL (ref <20)

## 2011-08-01 ENCOUNTER — Ambulatory Visit (INDEPENDENT_AMBULATORY_CARE_PROVIDER_SITE_OTHER): Payer: Medicare Other | Admitting: Internal Medicine

## 2011-08-01 ENCOUNTER — Encounter: Payer: Self-pay | Admitting: Internal Medicine

## 2011-08-01 VITALS — BP 112/70 | Temp 98.0°F | Wt 215.0 lb

## 2011-08-01 DIAGNOSIS — R5381 Other malaise: Secondary | ICD-10-CM

## 2011-08-01 DIAGNOSIS — R531 Weakness: Secondary | ICD-10-CM

## 2011-08-01 DIAGNOSIS — M545 Low back pain: Secondary | ICD-10-CM

## 2011-08-01 DIAGNOSIS — J309 Allergic rhinitis, unspecified: Secondary | ICD-10-CM

## 2011-08-01 DIAGNOSIS — R634 Abnormal weight loss: Secondary | ICD-10-CM

## 2011-08-01 NOTE — Progress Notes (Signed)
  Subjective:    Patient ID: [name], male    DOB: 09-04-1945, 66 y.o.   MRN: [id number]  HPI  66 year old patient who is seen today for followup.  He continues to have some abdominal pain and fatigue that has had no further weight loss. In fact his weight is up 5 pounds compared to 6 weeks ago. A celiac screen was performed and was normal 9 complaints today are related to his allergic rhinitis   Review of Systems  Constitutional: Negative for fever, chills, appetite change and fatigue.  HENT: Positive for congestion, rhinorrhea and postnasal drip. Negative for hearing loss, ear pain, sore throat, trouble swallowing, neck stiffness, dental problem, voice change and tinnitus.   Eyes: Negative for pain, discharge and visual disturbance.  Respiratory: Negative for cough, chest tightness, wheezing and stridor.   Cardiovascular: Negative for chest pain, palpitations and leg swelling.  Gastrointestinal: Positive for abdominal pain. Negative for nausea, vomiting, diarrhea, constipation, blood in stool and abdominal distention.  Genitourinary: Negative for urgency, hematuria, flank pain, discharge, difficulty urinating and genital sores.  Musculoskeletal: Negative for myalgias, back pain, joint swelling, arthralgias and gait problem.  Skin: Negative for rash.  Neurological: Negative for dizziness, syncope, speech difficulty, weakness, numbness and headaches.  Hematological: Negative for adenopathy. Does not bruise/bleed easily.  Psychiatric/Behavioral: Negative for behavioral problems and dysphoric mood. The patient is not nervous/anxious.        Objective:   Physical Exam  Constitutional: He is oriented to person, place, and time. He appears well-developed.  HENT:  Head: Normocephalic.  Right Ear: External ear normal.  Left Ear: External ear normal.  Eyes: Conjunctivae and EOM are normal.  Neck: Normal range of motion.  Cardiovascular: Normal rate and normal heart sounds.     Pulmonary/Chest: Breath sounds normal.  Abdominal: Bowel sounds are normal.  Musculoskeletal: Normal range of motion. He exhibits no edema and no tenderness.  Neurological: He is alert and oriented to person, place, and time.  Psychiatric: He has a normal mood and affect. His behavior is normal.          Assessment & Plan:   Weight loss stable Chronic abdominal pain Chronic fatigue  The patient asked about the possibility of lactose intolerance. Will place on a diet Recheck 3 months

## 2011-08-01 NOTE — Patient Instructions (Signed)
Trial of lactose free diet Return in 3 months for follow-up    It is important that you exercise regularly, at least 20 minutes 3 to 4 times per week.  If you develop chest pain or shortness of breath seek  medical attention.  Lactose-Free Diet Lactose is a carbohydrate that is found mainly in milk and milk products, as well as in foods with added milk or whey. Lactose must be digested by the enzyme lactase in order to be used by the body. Lactose intolerance occurs when there is a shortage of lactase. When your body is not able to digest lactose, you may feel sick to your stomach (nausea), bloated, and have cramps, gas, and diarrhea. TYPES OF LACTASE DEFICIENCY  Primary lactase deficiency. This is the most common type. It is characterized by a slow decrease in lactase activity.   Secondary lactase deficiency. This occurs following injury to the small intestinal mucosa as a result of a disease or condition. It can also occur as a result of surgery or after treatment with antibiotic medicines or cancer drugs.  Tolerance to lactose varies widely. Each person must determine how much milk can be consumed without developing symptoms. Drinking smaller portions of milk throughout the day may be helpful. Some studies suggest that slowing gastric emptying may help increase tolerance of milk products. This may be done by:  Consuming milk or milk products with a meal rather than alone.   Consuming milk with a higher fat content.  There are many dairy products that may be tolerated better than milk by some people, including:  Cheese (especially aged cheese). The lactose content is much lower than in milk.   Cultured dairy products, such as yogurt, buttermilk, cottage cheese, and sweet acidophilus milk (kefir). These products are usually well tolerated by lactase-deficient people. This is because the healthy bacteria help digest lactose.   Lactose-hydrolyzed milk. This product contains 40% to 90% less  lactose than milk and may also be well tolerated.  ADEQUACY These diets may be deficient in calcium, riboflavin, and vitamin D, according to the Recommended Dietary Allowances of the [employer]. Depending on individual tolerances and the use of milk substitutes, milk, or other dairy products, you may be able to meet these recommendations. SPECIAL NOTES  Lactose is a carbohydrate. The main food source for lactose is dairy products. Reading food labels is important. Many products contain lactose even when they are not made from milk. Look for the following words: whey, milk solids, dry milk solids, nonfat dry milk powder. Typical sources of lactose other than dairy products include breads, candies, cold cuts, prepared and processed foods, and commercial sauces and gravies.   All foods must be prepared without milk, cream, or other dairy foods.   A vitamin or mineral supplement may be necessary. Consult your caregiver or Registered Dietitian.   Lactose is also found in many prescription and over-the-counter medicines.   Soy milk and lactose-free supplements may be used as an alternative to milk.  CHOOSING FOODS Breads and Starches  Allowed: Breads and rolls made without milk. [place], [place], or [place] bread. Soda crackers, graham crackers. Any crackers prepared without lactose. Cooked or dry cereals prepared without lactose (read labels). Any potatoes, pasta, or rice prepared without milk or lactose. Popcorn.   Avoid: Breads and rolls that contain milk. Prepared mixes such as muffins, biscuits, waffles, pancakes. Sweet rolls, donuts, [place] toast (if made with milk or lactose). Zwieback crackers, corn curls, or any crackers that contain  lactose. Cooked or dry cereals prepared with lactose (read labels). Instant potatoes, frozen [place] fries, scalloped or au gratin potatoes.  Vegetables  Allowed: Fresh, frozen, and canned vegetables.   Avoid: Creamed or breaded vegetables.  Vegetables in a cheese sauce or with lactose-containing margarines.  Fruit  Allowed: All fresh, canned, or frozen fruits that are not processed with lactose.   Avoid: Any canned or frozen fruits processed with lactose.  Meat and Meat Substitutes  Allowed: Plain beef, chicken, fish, [place], lamb, veal, pork, or ham. Kosher prepared meat products. Strained or junior meats that do not contain milk. Eggs, soy meat substitutes, nuts.   Avoid: Scrambled eggs, omelets, and souffles that contain milk. Creamed or breaded meat, fish, or fowl. Sausage products such as wieners, liver sausage, or cold cuts that contain milk solids. Cheese, cottage cheese, or cheese spreads.  Milk  Allowed: None.   Avoid: Milk (whole, 2%, skim, or chocolate). Evaporated, powdered, or condensed milk. Malted milk.  Soups and Combination Foods  Allowed: Bouillon, broth, vegetable soups, clear soups, consomms. Homemade soups made with allowed ingredients. Combination or prepared foods that do not contain milk or milk products (read labels).   Avoid: Cream soups, chowders, commercially prepared soups containing lactose. Macaroni and cheese, pizza. Combination or prepared foods that contain milk or milk products.  Desserts and Sweets  Allowed: Water and fruit ices, gelatin, angel food cake. Homemade cookies, pies, or cakes made from allowed ingredients. Pudding (if made with water or a milk substitute). Lactose-free tofu desserts. Sugar, honey, corn syrup, jam, jelly, marmalade, molasses (beet sugar). Pure sugar candy, marshmallows.   Avoid: Ice cream, ice milk, sherbet, custard, pudding, frozen yogurt. Commercial cake and cookie mixes. Desserts that contain chocolate. Pie crust made with milk-containing margarine. Reduced calorie desserts made with a sugar substitute that contains lactose. Toffee, peppermint, butterscotch, chocolate, caramels.  Fats and Oils  Allowed: Butter (as tolerated, contains very small amounts of  lactose). Margarines and dressings that do not contain milk. Vegetable oils, shortening, mayonnaise, nondairy cream and whipped toppings without lactose or milk solids added. Tomasa Blase.   Avoid: Margarines and salad dressings containing milk. Cream, cream cheese, peanut butter with added milk solids, sour cream, chip dips made with sour cream.  Beverages  Allowed: Carbonated drinks, tea, coffee and freeze-dried coffee, some instant coffees (check labels). Fruit drinks, fruit and vegetable juice, rice or soy milk.   Avoid: Hot chocolate. Some cocoas, some instant coffees, instant iced teas, powdered fruit drinks (read labels).  Condiments  Allowed: Soy sauce, carob powder, olives, gravy made with water, baker's cocoa, pickles, pure seasonings and spices, wine, pure monosodium glutamate, catsup, mustard.   Avoid: Some chewing gums, chocolate, some cocoas. Certain antibiotics and vitamin or mineral preparations. Spice blends if they contain milk products. MSG extender. Artificial sweeteners that contain lactose. Some nondairy creamers (read labels).  SAMPLE MENU Breakfast  Orange juice.   Banana.   Bran cereal.   Nondairy creamer.   Vienna bread, toasted.   Butter or milk-free margarine.   Coffee or tea.  Lunch  Chicken breast.   Rice.   Green beans.   Butter or milk-free margarine.   Fresh melon.   Coffee or tea.  Dinner  [employer].   Baked potato.   Butter or milk-free margarine.   Broccoli.   Lettuce salad with vinegar and oil dressing.   [employer].   Coffee or tea.  Document Released: 09/09/2001 Document Revised: 03/09/2011 Document Reviewed: 06/17/2010 [employer] Patient Information  [address] [place].Lactose Intolerance, Adult Lactose intolerance is when the body is not able to digest lactose, a sugar found in milk and milk products. Lactose intolerance is caused by your body not producing enough of the enzyme lactase. When there is not enough lactase  to digest the amount of lactose consumed, discomfort may be felt. Lactose intolerance is not a milk allergy. For most people, lactase deficiency is a condition that develops naturally over time. After about the age of 2, the body begins to produce less lactase. But many people may not experience symptoms until they are much older. CAUSES Things that can cause you to be lactose intolerant include:  Aging.   Being born without the ability to make lactase.   Certain digestive diseases.   Injuries to the small intestine.  SYMPTOMS    Feeling sick to your stomach (nauseous).   Diarrhea.   Cramps.   Bloating.   Gas.  Symptoms usually show up a half hour or 2 hours after eating or drinking products containing lactose. TREATMENT   No treatment can improve the body's ability to produce lactase. However, symptoms can be controlled through diet. A medicine may be given to you to take when you consume lactose-containing foods or drinks. The medicine contains the lactase enzyme, which help the body digest lactose better. HOME CARE INSTRUCTIONS  Eat or drink dairy products as told by your caregiver or dietician.   Take all medicine as directed by your caregiver.   Find lactose-free or lactose-reduced products at your local grocery store.   Talk to your caregiver or dietician to decide if you need any dietary supplements.  The following is the amount of calcium needed from the diet:  19 to 50 years: 1000 mg   Over 50 years: 1200 mg  Calcium and Lactose in Common Foods Non-Dairy Products / Calcium Content (mg)  Calcium-fortified orange juice, 1 cup / 308 to 344 mg   Sardines, with edible bones, 3 oz / 270 mg   Salmon, canned, with edible bones, 3 oz / 205 mg   Soymilk, fortified, 1 cup / 200 mg   Broccoli (raw), 1 cup / 90 mg   Orange, 1 medium / 50 mg   Pinto beans,  cup / 40 mg   Tuna, canned, 3 oz / 10 mg   Lettuce greens,  cup / 10 mg  Dairy Products / Calcium  Content (mg) / Lactose Content (g)  Yogurt, plain, low-fat, 1 cup / 415 mg / 5 g   Milk, reduced fat, 1 cup / 295 mg / 11 g   Swiss cheese, 1 oz / 270 mg / 1 g   Ice cream,  cup / 85 mg / 6 g   Cottage cheese,  cup / 75 mg / 2 to 3 g  SEEK MEDICAL CARE IF: You have no relief from your symptoms. Document Released: 03/20/2005 Document Revised: 03/09/2011 Document Reviewed: 06/17/2010 [facility] Patient Information 2012 [place], [place].

## 2011-08-11 ENCOUNTER — Ambulatory Visit (INDEPENDENT_AMBULATORY_CARE_PROVIDER_SITE_OTHER): Payer: Medicare Other | Admitting: Family

## 2011-08-11 ENCOUNTER — Encounter: Payer: Self-pay | Admitting: Family

## 2011-08-11 VITALS — BP 130/82 | Temp 98.2°F | Wt 212.0 lb

## 2011-08-11 DIAGNOSIS — R05 Cough: Secondary | ICD-10-CM

## 2011-08-11 DIAGNOSIS — J0100 Acute maxillary sinusitis, unspecified: Secondary | ICD-10-CM

## 2011-08-11 MED ORDER — AMOXICILLIN 500 MG PO TABS: 1000.0000 mg | ORAL_TABLET | Freq: Two times a day (BID) | ORAL | Status: AC

## 2011-08-11 NOTE — Progress Notes (Signed)
Subjective:    Patient ID: [name], male    DOB: Jan 07, 1946, 66 y.o.   MRN: [id number]  HPI 66 year old white male, nonsmoker, patient of Dr. Kirtland Bouchard. is in today with complaints of sinus congestion, sinus pressure and pain, green sputum that's been going on for a week and worsening. His taken over-the-counter Claritin with no relief. Denies any body aches or pain denies any lightheadedness, dizziness, chest pain, shortness of breath or edema.   Review of Systems  Constitutional: Positive for fatigue.  HENT: Positive for nosebleeds, congestion, rhinorrhea, sneezing, postnasal drip and sinus pressure.   Respiratory: Positive for cough.   Cardiovascular: Negative.   Musculoskeletal: Negative.   Skin: Negative.   Neurological: Negative.   Hematological: Negative.   Psychiatric/Behavioral: Negative.    Past Medical History  Diagnosis Date  . ALLERGIC RHINITIS 06/17/2009  . GOUT 05/04/2008  . LOW BACK PAIN 05/04/2008  . OSTEOARTHRITIS 05/04/2008  . PULMONARY NODULE, LEFT LOWER LOBE 01/04/2010  . TESTICULAR HYPOFUNCTION 01/04/2010  . WEIGHT LOSS 03/17/2009  . Night sweats   . Abdominal pain     History   Social History  . Marital Status: Married    Spouse Name: N/A    Number of Children: N/A  . Years of Education: N/A   Occupational History  . Not on file.   Social History Main Topics  . Smoking status: Never Smoker   . Smokeless tobacco: Never Used  . Alcohol Use: 5.0 oz/week    10 drink(s) per week  . Drug Use: No  . Sexually Active: Not on file   Other Topics Concern  . Not on file   Social History Narrative  . No narrative on file    Past Surgical History  Procedure Date  . Colostomy   . Knee surgery     3 on each knee  . Ankle surgery     4 times  . Elbow surgery     torn tendon  . Tonsillectomy and adenoidectomy   . Inguinal hernia repair     right    No family history on file.  No Known Allergies  Current Outpatient Prescriptions on File Prior to  Visit  Medication Sig Dispense Refill  . allopurinol (ZYLOPRIM) 100 MG tablet Take 100 mg by mouth daily. 5 once daily       . aspirin 325 MG tablet Take 325 mg by mouth daily.        . Clobetasol Prop Emollient Base 0.05 % emollient cream Apply topically as needed.        . colchicine 0.6 MG tablet Take 0.6 mg by mouth as needed. gout      . flunisolide (NASALIDE) 0.025 % SOLN       . fluocinonide ointment (LIDEX) 0.05 %       . ibuprofen (ADVIL,MOTRIN) 600 MG tablet Take 600 mg by mouth every 8 (eight) hours as needed.        . indomethacin (INDOCIN) 50 MG capsule Take 1 capsule (50 mg total) by mouth 3 (three) times daily as needed.  90 capsule  1  . methocarbamol (ROBAXIN) 750 MG tablet Take 750 mg by mouth as needed.        . Multiple Vitamin (MULTIVITAMIN) tablet Take 1 tablet by mouth daily.      Marland Kitchen oxyCODONE (ROXICODONE) 5 MG immediate release tablet Take 5 mg by mouth every 4 (four) hours as needed. Takes 2 every 4 hours as needed for pain in back      .  ranitidine (ZANTAC) 150 MG tablet Take 150 mg by mouth 2 (two) times daily.      . solifenacin (VESICARE) 5 MG tablet Take 10 mg by mouth daily.        BP 130/82  Temp(Src) 98.2 F (36.8 C) (Oral)  Wt 212 lb (96.163 kg)chart and in and    Objective:   Physical Exam  Constitutional: He is oriented to person, place, and time. He appears well-developed and well-nourished.  HENT:  Right Ear: External ear normal.  Left Ear: External ear normal.  Nose: Nose normal.  Mouth/Throat: Oropharynx is clear and moist.       Tenderness to palpation of the maxillary sinuses  Neck: Normal range of motion. Neck supple.  Cardiovascular: Normal rate, regular rhythm and normal heart sounds.   Pulmonary/Chest: Effort normal and breath sounds normal.  Neurological: He is alert and oriented to person, place, and time.  Skin: Skin is warm and dry.  Psychiatric: He has a normal mood and affect.          Assessment & Plan:  Assessment:  Acute sinusitis, cough  Plan: Amoxicillin 500 mg 2 capsules by mouth twice a day x10 days, Claritin over-the-counter. Rest. Drink plenty of fluids. Patient call the office if symptoms worsen or persist. Recheck a schedule, and when necessary.

## 2011-08-11 NOTE — Patient Instructions (Signed)

## 2011-08-23 ENCOUNTER — Other Ambulatory Visit: Payer: Self-pay | Admitting: Orthopaedic Surgery

## 2011-08-23 DIAGNOSIS — M25519 Pain in unspecified shoulder: Secondary | ICD-10-CM

## 2011-08-27 ENCOUNTER — Ambulatory Visit
Admission: RE | Admit: 2011-08-27 | Discharge: 2011-08-27 | Disposition: A | Discharge status: Home / Self Care | Payer: Medicare Other | Source: Ambulatory Visit | Attending: Orthopaedic Surgery | Admitting: Orthopaedic Surgery

## 2011-08-27 DIAGNOSIS — M25519 Pain in unspecified shoulder: Secondary | ICD-10-CM

## 2011-09-13 ENCOUNTER — Other Ambulatory Visit: Payer: Self-pay | Admitting: Orthopaedic Surgery

## 2011-09-14 ENCOUNTER — Other Ambulatory Visit: Payer: Self-pay

## 2011-09-14 ENCOUNTER — Encounter (HOSPITAL_BASED_OUTPATIENT_CLINIC_OR_DEPARTMENT_OTHER): Payer: Self-pay | Admitting: *Deleted

## 2011-09-14 ENCOUNTER — Encounter (HOSPITAL_BASED_OUTPATIENT_CLINIC_OR_DEPARTMENT_OTHER)
Admission: RE | Admit: 2011-09-14 | Discharge: 2011-09-14 | Disposition: A | Discharge status: Home / Self Care | Payer: Medicare Other | Source: Ambulatory Visit | Attending: Orthopaedic Surgery | Admitting: Orthopaedic Surgery

## 2011-09-14 LAB — BASIC METABOLIC PANEL
BUN: 6 mg/dL (ref 6–23)
CO2: 28 mEq/L (ref 19–32)
Calcium: 9.2 mg/dL (ref 8.4–10.5)
Chloride: 100 mEq/L (ref 96–112)
Creatinine, Ser: 0.89 mg/dL (ref 0.50–1.35)
GFR calc Af Amer: >90 mL/min (ref >90)
GFR calc non Af Amer: 87 mL/min — ABNORMAL LOW (ref >90)
Glucose, Bld: 115 mg/dL — ABNORMAL HIGH (ref 70–99)
Potassium: 4.4 mEq/L (ref 3.5–5.1)
Sodium: 138 mEq/L (ref 135–145)

## 2011-09-14 NOTE — Progress Notes (Signed)
To come in for [id number] due to gout Bring all meds-and possible overnight bag

## 2011-09-14 NOTE — H&P (Signed)
[name] is an 66 y.o. male.   Chief Complaint: left shoulder pain HPI: Pt has had many months of increasing left shoulder pain with activity and rest. He now can not sleep at night due to pain. We have done 2 injections giving only short term relief.He has also failed home pt. MRI from 08/27/11 shows a rct. We have discussed fixing the cuff tear with .  Past Medical History  Diagnosis Date  . ALLERGIC RHINITIS 06/17/2009  . GOUT 05/04/2008  . LOW BACK PAIN 05/04/2008  . OSTEOARTHRITIS 05/04/2008  . PULMONARY NODULE, LEFT LOWER LOBE 01/04/2010  . TESTICULAR HYPOFUNCTION 01/04/2010  . WEIGHT LOSS 03/17/2009  . Night sweats   . Abdominal pain   . Complication of anesthesia     hallucination with a med started with H    Past Surgical History  Procedure Date  . Colostomy   . Knee surgery     3 on each knee  . Ankle surgery     4 times  . Elbow surgery     torn tendon  . Tonsillectomy and adenoidectomy   . Inguinal hernia repair     right    No family history on file. Social History:  reports that he has never smoked. He has never used smokeless tobacco. He reports that he drinks about 5 ounces of alcohol per week. He reports that he does not use illicit drugs.  Allergies: No Known Allergies  No prescriptions prior to admission    No results found for this or any previous visit (from the past 48 hour(s)). No results found.  Review of Systems  Constitutional: Negative.   HENT: Negative.   Eyes: Negative.   Respiratory: Negative.   Cardiovascular: Negative.   Gastrointestinal: Negative.   Genitourinary: Negative.   Musculoskeletal: Negative.   Skin: Negative.   Neurological: Negative.   Endo/Heme/Allergies: Negative.   Psychiatric/Behavioral: Negative.     There were no vitals taken for this visit. Physical Exam  Constitutional: He appears well-nourished.  HENT:  Head: Normocephalic.  Eyes: Pupils are equal, round, and reactive to light.  Neck: Normal range of  motion.  Cardiovascular: Regular rhythm.   Respiratory: Breath sounds normal.  GI: Bowel sounds are normal.  Musculoskeletal:       Left shoulder : rom 145 , 45, L3 Weak ext rotators + impingement Good n/v  Negative drop arm test  Neurological: He is alert.  Skin: Skin is warm.  Psychiatric: He has a normal mood and affect.     Assessment/Plan A: Left shoulder RCT P: We have discussed the risks / benefits of going ahead with a left shoulder arthroscopy to help with the pain and restore function. He will need therapy as well post op.  Consetta Cosner R 09/14/2011, 12:18 PM

## 2011-09-19 ENCOUNTER — Encounter (HOSPITAL_BASED_OUTPATIENT_CLINIC_OR_DEPARTMENT_OTHER): Payer: Self-pay | Admitting: Anesthesiology

## 2011-09-19 ENCOUNTER — Ambulatory Visit (HOSPITAL_BASED_OUTPATIENT_CLINIC_OR_DEPARTMENT_OTHER): Payer: Medicare Other | Admitting: Anesthesiology

## 2011-09-19 ENCOUNTER — Ambulatory Visit (HOSPITAL_BASED_OUTPATIENT_CLINIC_OR_DEPARTMENT_OTHER)
Admission: RE | Admit: 2011-09-19 | Discharge: 2011-09-19 | Disposition: A | Discharge status: Home / Self Care | Payer: Medicare Other | Source: Ambulatory Visit | Attending: Orthopaedic Surgery | Admitting: Orthopaedic Surgery

## 2011-09-19 ENCOUNTER — Encounter (HOSPITAL_BASED_OUTPATIENT_CLINIC_OR_DEPARTMENT_OTHER)
Admission: RE | Disposition: A | Discharge status: Home / Self Care | Payer: Self-pay | Source: Ambulatory Visit | Attending: Orthopaedic Surgery

## 2011-09-19 ENCOUNTER — Encounter (HOSPITAL_BASED_OUTPATIENT_CLINIC_OR_DEPARTMENT_OTHER): Payer: Self-pay | Admitting: *Deleted

## 2011-09-19 DIAGNOSIS — M1990 Unspecified osteoarthritis, unspecified site: Secondary | ICD-10-CM | POA: Insufficient documentation

## 2011-09-19 DIAGNOSIS — M75110 Incomplete rotator cuff tear or rupture of unspecified shoulder, not specified as traumatic: Secondary | ICD-10-CM | POA: Insufficient documentation

## 2011-09-19 DIAGNOSIS — Z01810 Encounter for preprocedural cardiovascular examination: Secondary | ICD-10-CM | POA: Insufficient documentation

## 2011-09-19 DIAGNOSIS — M719 Bursopathy, unspecified: Secondary | ICD-10-CM | POA: Insufficient documentation

## 2011-09-19 DIAGNOSIS — K219 Gastro-esophageal reflux disease without esophagitis: Secondary | ICD-10-CM | POA: Insufficient documentation

## 2011-09-19 DIAGNOSIS — M109 Gout, unspecified: Secondary | ICD-10-CM | POA: Insufficient documentation

## 2011-09-19 DIAGNOSIS — M7540 Impingement syndrome of unspecified shoulder: Secondary | ICD-10-CM

## 2011-09-19 DIAGNOSIS — M6790 Unspecified disorder of synovium and tendon, unspecified site: Secondary | ICD-10-CM | POA: Insufficient documentation

## 2011-09-19 DIAGNOSIS — M25819 Other specified joint disorders, unspecified shoulder: Secondary | ICD-10-CM | POA: Insufficient documentation

## 2011-09-19 SURGERY — SHOULDER ARTHROSCOPY WITH SUBACROMIAL DECOMPRESSION
Anesthesia: General | Site: Shoulder | Laterality: Left | Wound class: Clean

## 2011-09-19 MED ORDER — CHLORHEXIDINE GLUCONATE 4 % EX LIQD: 60.0000 mL | Freq: Once | CUTANEOUS | Status: DC

## 2011-09-19 MED ORDER — FENTANYL CITRATE 0.05 MG/ML IJ SOLN
50.0000 ug | INTRAMUSCULAR | Status: DC | PRN
  Administered 2011-09-19: 100 ug via INTRAVENOUS

## 2011-09-19 MED ORDER — CEFAZOLIN SODIUM-DEXTROSE 2-3 GM-% IV SOLR
2.0000 g | INTRAVENOUS | Status: AC
  Administered 2011-09-19: 2 g via INTRAVENOUS

## 2011-09-19 MED ORDER — PROMETHAZINE HCL 25 MG/ML IJ SOLN: 6.2500 mg | INTRAMUSCULAR | Status: DC | PRN

## 2011-09-19 MED ORDER — FENTANYL CITRATE 0.05 MG/ML IJ SOLN
INTRAMUSCULAR | Status: DC | PRN
  Administered 2011-09-19: 100 ug via INTRAVENOUS

## 2011-09-19 MED ORDER — PROPOFOL 10 MG/ML IV EMUL
INTRAVENOUS | Status: DC | PRN
  Administered 2011-09-19: 250 mg via INTRAVENOUS

## 2011-09-19 MED ORDER — MEPERIDINE HCL 25 MG/ML IJ SOLN: 6.2500 mg | INTRAMUSCULAR | Status: DC | PRN

## 2011-09-19 MED ORDER — LIDOCAINE HCL (CARDIAC) 20 MG/ML IV SOLN
INTRAVENOUS | Status: DC | PRN
  Administered 2011-09-19: 50 mg via INTRAVENOUS

## 2011-09-19 MED ORDER — SUCCINYLCHOLINE CHLORIDE 20 MG/ML IJ SOLN
INTRAMUSCULAR | Status: DC | PRN
  Administered 2011-09-19: 100 mg via INTRAVENOUS

## 2011-09-19 MED ORDER — HYDROMORPHONE HCL 2 MG PO TABS: 2.0000 mg | ORAL_TABLET | ORAL | Status: AC | PRN

## 2011-09-19 MED ORDER — LACTATED RINGERS IV SOLN
INTRAVENOUS | Status: DC
Start: 2011-09-19 — End: 2011-09-19

## 2011-09-19 MED ORDER — HYDROMORPHONE HCL 2 MG PO TABS
2.0000 mg | ORAL_TABLET | Freq: Four times a day (QID) | ORAL | Status: DC | PRN
  Administered 2011-09-19: 2 mg via ORAL

## 2011-09-19 MED ORDER — MIDAZOLAM HCL 2 MG/2ML IJ SOLN: 0.5000 mg | Freq: Once | INTRAMUSCULAR | Status: DC | PRN

## 2011-09-19 MED ORDER — MIDAZOLAM HCL 2 MG/2ML IJ SOLN
1.0000 mg | INTRAMUSCULAR | Status: DC | PRN
  Administered 2011-09-19: 2 mg via INTRAVENOUS

## 2011-09-19 MED ORDER — DEXAMETHASONE SODIUM PHOSPHATE 4 MG/ML IJ SOLN
INTRAMUSCULAR | Status: DC | PRN
  Administered 2011-09-19: 10 mg via INTRAVENOUS

## 2011-09-19 MED ORDER — ONDANSETRON HCL 4 MG/2ML IJ SOLN
INTRAMUSCULAR | Status: DC | PRN
  Administered 2011-09-19: 4 mg via INTRAVENOUS

## 2011-09-19 MED ORDER — HYDROMORPHONE HCL PF 1 MG/ML IJ SOLN
0.2500 mg | INTRAMUSCULAR | Status: DC | PRN
  Administered 2011-09-19: 0.25 mg via INTRAVENOUS
  Administered 2011-09-19: 0.5 mg via INTRAVENOUS

## 2011-09-19 MED ORDER — LACTATED RINGERS IV SOLN
INTRAVENOUS | Status: DC
  Administered 2011-09-19 (×3): via INTRAVENOUS

## 2011-09-19 MED ORDER — LABETALOL HCL 5 MG/ML IV SOLN
INTRAVENOUS | Status: DC | PRN
  Administered 2011-09-19: 5 mg via INTRAVENOUS

## 2011-09-19 MED ORDER — BUPIVACAINE-EPINEPHRINE PF 0.5-1:200000 % IJ SOLN
INTRAMUSCULAR | Status: DC | PRN
  Administered 2011-09-19: 30 mL

## 2011-09-19 MED ORDER — SODIUM CHLORIDE 0.9 % IR SOLN
Status: DC | PRN
  Administered 2011-09-19: 6000 mL

## 2011-09-19 SURGICAL SUPPLY — 70 items
BENZOIN TINCTURE PRP APPL 2/3 (GAUZE/BANDAGES/DRESSINGS) IMPLANT
BLADE CUDA 5.5 (BLADE) IMPLANT
BLADE GREAT WHITE 4.2 (BLADE) ×3 IMPLANT
BLADE SURG 15 STRL LF DISP TIS (BLADE) IMPLANT
BLADE SURG 15 STRL SS (BLADE)
BUR VERTEX HOODED 4.5 (BURR) ×3 IMPLANT
CANISTER OMNI JUG 16 LITER (MISCELLANEOUS) ×3 IMPLANT
CANISTER SUCTION 2500CC (MISCELLANEOUS) IMPLANT
CANNULA SHOULDER 7CM (CANNULA) ×3 IMPLANT
CANNULA TWIST IN 8.25X7CM (CANNULA) IMPLANT
DECANTER SPIKE VIAL GLASS SM (MISCELLANEOUS) IMPLANT
DERMABOND ADVANCED (GAUZE/BANDAGES/DRESSINGS)
DERMABOND ADVANCED .7 DNX12 (GAUZE/BANDAGES/DRESSINGS) IMPLANT
DRAPE STERI 35X30 U-POUCH (DRAPES) ×3 IMPLANT
DRAPE U-SHAPE 47X51 STRL (DRAPES) ×3 IMPLANT
DRAPE U-SHAPE 76X120 STRL (DRAPES) ×6 IMPLANT
DRSG EMULSION OIL 3X3 NADH (GAUZE/BANDAGES/DRESSINGS) ×3 IMPLANT
DRSG PAD ABDOMINAL 8X10 ST (GAUZE/BANDAGES/DRESSINGS) ×3 IMPLANT
DURAPREP 26ML APPLICATOR (WOUND CARE) ×3 IMPLANT
ELECT MENISCUS 165MM 90D (ELECTRODE) IMPLANT
ELECT REM PT RETURN 9FT ADLT (ELECTROSURGICAL) ×3
ELECTRODE REM PT RTRN 9FT ADLT (ELECTROSURGICAL) ×2 IMPLANT
GLOVE BIO SURGEON STRL SZ 6.5 (GLOVE) ×3 IMPLANT
GLOVE BIO SURGEON STRL SZ8.5 (GLOVE) ×3 IMPLANT
GLOVE BIOGEL PI IND STRL 6.5 (GLOVE) ×2 IMPLANT
GLOVE BIOGEL PI IND STRL 8 (GLOVE) ×2 IMPLANT
GLOVE BIOGEL PI IND STRL 8.5 (GLOVE) ×2 IMPLANT
GLOVE BIOGEL PI INDICATOR 6.5 (GLOVE) ×1
GLOVE BIOGEL PI INDICATOR 8 (GLOVE) ×1
GLOVE BIOGEL PI INDICATOR 8.5 (GLOVE) ×1
GLOVE ECLIPSE 6.5 STRL STRAW (GLOVE) ×3 IMPLANT
GLOVE SS BIOGEL STRL SZ 8 (GLOVE) ×2 IMPLANT
GLOVE SUPERSENSE BIOGEL SZ 8 (GLOVE) ×1
GOWN PREVENTION PLUS XLARGE (GOWN DISPOSABLE) ×9 IMPLANT
GOWN PREVENTION PLUS XXLARGE (GOWN DISPOSABLE) ×3 IMPLANT
NDL SUT 6 .5 CRC .975X.05 MAYO (NEEDLE) IMPLANT
NEEDLE MAYO TAPER (NEEDLE)
NEEDLE SCORPION MULTI FIRE (NEEDLE) IMPLANT
NS IRRIG 1000ML POUR BTL (IV SOLUTION) IMPLANT
PACK ARTHROSCOPY DSU (CUSTOM PROCEDURE TRAY) ×3 IMPLANT
PACK BASIN DAY SURGERY FS (CUSTOM PROCEDURE TRAY) ×3 IMPLANT
PASSER SUT SWANSON 36MM LOOP (INSTRUMENTS) IMPLANT
PENCIL BUTTON HOLSTER BLD 10FT (ELECTRODE) IMPLANT
SET ARTHROSCOPY TUBING (MISCELLANEOUS) ×1
SET ARTHROSCOPY TUBING LN (MISCELLANEOUS) ×2 IMPLANT
SHEET MEDIUM DRAPE 40X70 STRL (DRAPES) ×3 IMPLANT
SLEEVE SCD COMPRESS KNEE MED (MISCELLANEOUS) ×3 IMPLANT
SLING ARM FOAM STRAP LRG (SOFTGOODS) ×3 IMPLANT
SLING ARM FOAM STRAP MED (SOFTGOODS) IMPLANT
SLING ARM FOAM STRAP SML (SOFTGOODS) IMPLANT
SLING ARM FOAM STRAP XLG (SOFTGOODS) IMPLANT
SPONGE GAUZE 4X4 12PLY (GAUZE/BANDAGES/DRESSINGS) ×3 IMPLANT
SPONGE LAP 4X18 X RAY DECT (DISPOSABLE) IMPLANT
STRIP CLOSURE SKIN 1/2X4 (GAUZE/BANDAGES/DRESSINGS) IMPLANT
SUCTION FRAZIER TIP 10 FR DISP (SUCTIONS) IMPLANT
SUT ETHIBOND 2 OS 4 DA (SUTURE) IMPLANT
SUT ETHILON 3 0 PS 1 (SUTURE) ×3 IMPLANT
SUT FIBERWIRE #2 38 T-5 BLUE (SUTURE)
SUT PDS AB 2-0 CT2 27 (SUTURE) IMPLANT
SUT VIC AB 0 SH 27 (SUTURE) IMPLANT
SUT VIC AB 2-0 SH 27 (SUTURE)
SUT VIC AB 2-0 SH 27XBRD (SUTURE) IMPLANT
SUT VICRYL 4-0 PS2 18IN ABS (SUTURE) IMPLANT
SUTURE FIBERWR #2 38 T-5 BLUE (SUTURE) IMPLANT
SYR BULB 3OZ (MISCELLANEOUS) IMPLANT
TOWEL OR 17X24 6PK STRL BLUE (TOWEL DISPOSABLE) ×3 IMPLANT
TOWEL OR NON WOVEN STRL DISP B (DISPOSABLE) ×3 IMPLANT
WAND STAR VAC 90 (SURGICAL WAND) ×3 IMPLANT
WATER STERILE IRR 1000ML POUR (IV SOLUTION) ×3 IMPLANT
YANKAUER SUCT BULB TIP NO VENT (SUCTIONS) IMPLANT

## 2011-09-19 NOTE — Progress Notes (Signed)
AssistedDr. Jackson with left, interscalene  block. Side rails up, monitors on throughout procedure. See vital signs in flow sheet. Tolerated Procedure well.  

## 2011-09-19 NOTE — Op Note (Signed)
#  [id number] 

## 2011-09-19 NOTE — Anesthesia Preprocedure Evaluation (Signed)
Anesthesia Evaluation  Patient identified by MRN, date of birth, ID band Patient awake    Reviewed: Allergy & Precautions, H&P , NPO status , Patient's Chart, lab work & pertinent test results  History of Anesthesia Complications Negative for: history of anesthetic complications  Airway Mallampati: I TM Distance: >3 FB Neck ROM: Full    Dental No notable dental hx. (+) Teeth Intact, Dental Advisory Given, Caps and Implants   Pulmonary neg pulmonary ROS,  breath sounds clear to auscultation  Pulmonary exam normal       Cardiovascular negative cardio ROS  Rhythm:Regular Rate:Normal     Neuro/Psych negative neurological ROS     GI/Hepatic Neg liver ROS, GERD-  Medicated and Controlled,  Endo/Other  negative endocrine ROS  Renal/GU negative Renal ROS     Musculoskeletal   Abdominal   Peds  Hematology negative hematology ROS (+)   Anesthesia Other Findings   Reproductive/Obstetrics                           Anesthesia Physical Anesthesia Plan  ASA: II  Anesthesia Plan: General   Post-op Pain Management:    Induction: Intravenous  Airway Management Planned: Oral ETT  Additional Equipment:   Intra-op Plan:   Post-operative Plan: Extubation in OR  Informed Consent: I have reviewed the patients History and Physical, chart, labs and discussed the procedure including the risks, benefits and alternatives for the proposed anesthesia with the patient or authorized representative who has indicated his/her understanding and acceptance.   Dental advisory given  Plan Discussed with: CRNA and Surgeon  Anesthesia Plan Comments: (Plan routine monitors, GETA with interscalene block for post op analgesia)        Anesthesia Quick Evaluation

## 2011-09-19 NOTE — Discharge Instructions (Signed)
Sling as needed and ice May change dressing in 1-2 days RTO 7-10 days   Post Anesthesia Home Care Instructions  Activity: Get plenty of rest for the remainder of the day. A responsible adult should stay with you for 24 hours following the procedure.  For the next 24 hours, DO NOT: -Drive a car -Advertising copywriter -Drink alcoholic beverages -Take any medication unless instructed by your physician -Make any legal decisions or sign important papers.  Meals: Start with liquid foods such as gelatin or soup. Progress to regular foods as tolerated. Avoid greasy, spicy, heavy foods. If nausea and/or vomiting occur, drink only clear liquids until the nausea and/or vomiting subsides. Call your physician if vomiting continues.  Special Instructions/Symptoms: Your throat may feel dry or sore from the anesthesia or the breathing tube placed in your throat during surgery. If this causes discomfort, gargle with warm salt water. The discomfort should disappear within 24 hours.    Regional Anesthesia Blocks  1. Numbness or the inability to move the "blocked" extremity may last from 3-48 hours after placement. The length of time depends on the medication injected and your individual response to the medication. If the numbness is not going away after 48 hours, call your surgeon.  2. The extremity that is blocked will need to be protected until the numbness is gone and the  Strength has returned. Because you cannot feel it, you will need to take extra care to avoid injury. Because it may be weak, you may have difficulty moving it or using it. You may not know what position it is in without looking at it while the block is in effect.  3. For blocks in the legs and feet, returning to weight bearing and walking needs to be done carefully. You will need to wait until the numbness is entirely gone and the strength has returned. You should be able to move your leg and foot normally before you try and bear weight  or walk. You will need someone to be with you when you first try to ensure you do not fall and possibly risk injury.  4. Bruising and tenderness at the needle site are common side effects and will resolve in a few days.  5. Persistent numbness or new problems with movement should be communicated to the surgeon or the [facility] [phone] [facility] [phone]).

## 2011-09-19 NOTE — Transfer of Care (Signed)
Immediate Anesthesia Transfer of Care Note  Patient: [name]  Procedure(s) Performed: Procedure(s) (LRB): SHOULDER ARTHROSCOPY WITH SUBACROMIAL DECOMPRESSION (Left)  Patient Location: PACU  Anesthesia Type: GA combined with regional for post-op pain  Level of Consciousness: awake, alert  and oriented  Airway & Oxygen Therapy: Patient Spontanous Breathing and Patient connected to face mask oxygen  Post-op Assessment: Report given to PACU RN and Post -op Vital signs reviewed and stable  Post vital signs: Reviewed and stable  Complications: No apparent anesthesia complications

## 2011-09-19 NOTE — Addendum Note (Signed)
Addendum  created 09/19/11 1604 by Burna Cash, CRNA   Modules edited:Anesthesia Blocks and Procedures, Inpatient Notes

## 2011-09-19 NOTE — Anesthesia Procedure Notes (Addendum)
Anesthesia Regional Block:  Interscalene brachial plexus block  Pre-Anesthetic Checklist: ,, timeout performed, Correct Patient, Correct Site, Correct Laterality, Correct Procedure, Correct Position, site marked, Risks and benefits discussed,  Surgical consent,  Pre-op evaluation,  At surgeon's request and post-op pain management  Laterality: Left  Prep: chloraprep       Needles:  Injection technique: Single-shot  Needle Type: Stimulator Needle - 40     Needle Length: 4cm  Needle Gauge: 22 and 22 G    Additional Needles:  Procedures: nerve stimulator Interscalene brachial plexus block  Nerve Stimulator or Paresthesia:  Response: forearm twitch, 0.45 mA, 0.1 ms,   Additional Responses:   Narrative:  Start time: 09/19/2011 1:33 PM End time: 09/19/2011 1:37 PM  Performed by: Personally  Anesthesiologist: Sandford Craze, MD  Additional Notes: Pt identified in Holding room.  Monitors applied. Working IV access confirmed. Sterile prep L neck.  #22ga PNS to forearm twitch at 0.55mA threshold.  30cc 0.5% Bupivacaine with 1:200k epi injected incrementally after negative test dose.  Patient asymptomatic, VSS, no heme aspirated, tolerated well.   Sandford Craze, MD   Procedure Name: Intubation Date/Time: 09/19/2011 2:11 PM Performed by: Burna Cash Pre-anesthesia Checklist: Patient identified, Emergency Drugs available, Suction available and Patient being monitored Patient Re-evaluated:Patient Re-evaluated prior to inductionOxygen Delivery Method: Circle System Utilized Preoxygenation: Pre-oxygenation with 100% oxygen Intubation Type: IV induction Ventilation: Mask ventilation without difficulty Laryngoscope Size: Mac and 3 Grade View: Grade I Tube type: Oral Tube size: 8.0 mm Number of attempts: 1 Airway Equipment and Method: stylet and oral airway Placement Confirmation: ETT inserted through vocal cords under direct vision,  positive ETCO2 and breath sounds checked- equal and  bilateral Secured at: 23 cm Tube secured with: Tape Dental Injury: Teeth and Oropharynx as per pre-operative assessment

## 2011-09-19 NOTE — Addendum Note (Signed)
Addendum  created 09/19/11 1604 by Rylan Bernard C Ozell Juhasz, CRNA   Modules edited:Anesthesia Blocks and Procedures, Inpatient Notes    

## 2011-09-19 NOTE — Interval H&P Note (Signed)
History and Physical Interval Note:  09/19/2011 1:35 PM  [name]  has presented today for surgery, with the diagnosis of left shoulder rotator cuff tear  The various methods of treatment have been discussed with the patient and family. After consideration of risks, benefits and other options for treatment, the patient has consented to  Procedure(s) (LRB): SHOULDER ARTHROSCOPY WITH ROTATOR CUFF REPAIR (Left) as a surgical intervention .  The patient's history has been reviewed, patient examined, no change in status, stable for surgery.  I have reviewed the patients' chart and labs.  Questions were answered to the patient's satisfaction.     Chase Knebel G

## 2011-09-19 NOTE — Anesthesia Postprocedure Evaluation (Signed)
Anesthesia Post Note  Patient: [name]  Procedure(s) Performed: Procedure(s) (LRB): SHOULDER ARTHROSCOPY WITH SUBACROMIAL DECOMPRESSION (Left)  Anesthesia type: General  Patient location: PACU  Post pain: Pain level controlled and Adequate analgesia  Post assessment: Post-op Vital signs reviewed, Patient's Cardiovascular Status Stable, Respiratory Function Stable, Patent Airway and Pain level controlled  Last Vitals:  Filed Vitals:   09/19/11 1505  BP: 140/77  Pulse: 96  Temp: 36.9 C  Resp: 18    Post vital signs: Reviewed and stable  Level of consciousness: awake, alert  and oriented  Complications: No apparent anesthesia complications

## 2011-09-20 NOTE — Op Note (Signed)
NAMEROBERTLEE, ROGACKI               ACCOUNT NO.:  [id number]  MEDICAL RECORD NO.:  [id number]  LOCATION:                                 FACILITY:  PHYSICIAN:  Lubertha Basque. Mozella Rexrode, M.D.DATE OF BIRTH:  06-12-1945  DATE OF PROCEDURE:  09/19/2011 DATE OF DISCHARGE:                              OPERATIVE REPORT   PREOPERATIVE DIAGNOSES: 1. Left shoulder impingement. 2. Left shoulder biceps degeneration. 3. Left shoulder partial rotator cuff tear.  POSTOPERATIVE DIAGNOSES: 1. Left shoulder impingement. 2. Left shoulder biceps degeneration. 3. Left shoulder partial rotator cuff tear.  PROCEDURES: 1. Left shoulder arthroscopic acromioplasty. 2. Left shoulder arthroscopic debridement. 3. Left shoulder arthroscopic partial claviculectomy.  ANESTHESIA:  General and block.  ATTENDING SURGEON:  Lubertha Basque. Jerl Santos, M.D.  ASSISTANT:  Lindwood Qua, PA.  INDICATION FOR PROCEDURE:  The patient is a 66 year old man with a long history of left shoulder pain.  This has persisted despite conservative measures of anti-inflammatories, exercise program, and 2 subacromial injections.  By MRI scan, he has partial versus full-thickness tear of the rotator cuff along with some biceps degeneration.  Pain which limits his ability to rest and use his arm.  He is offered an arthroscopy.  An informed operative consent was obtained after discussion of possible complications including reaction to anesthesia and infection.  SUMMARY OF FINDINGS AND PROCEDURE:  Under general anesthesia and a block, a left shoulder arthroscopy was performed.  The glenohumeral joint showed no degenerative changes.  The biceps tendon had some small tearing and about 10% debridement was required, but again 90% of the tendon appeared normal.  The rotator cuff did have authentic undersurface articular aspect tear, which constituted about 20% of thickness of the cuff.  I debrided that.  In the subacromial space, he had no tear  worthy of repair after thorough debridement of the bursal tissues.  I then decompressed the rotator cuff with an acromioplasty and partial claviculectomy.  He was discharged to home on the same day.  DESCRIPTION OF PROCEDURE:  The patient was taken to the operating suite where general anesthetic was applied without difficulty.  He was also given a block in the pre-anesthesia area.  He was positioned in beach- chair position and prepped and draped in normal sterile fashion.  After administration of IV Kefzol, an arthroscopy of the left shoulder was performed through a total of 3 portals.  Findings were as noted above and procedure consisted predominantly of the acromioplasty, which was done with a bur in the lateral position, followed by transfer of the bur to the posterior position.  I also performed a partial claviculectomy removing just the undersurface of the distal clavicle, which appeared to be impinging on the rotator cuff slightly.  In the subacromial space, we debrided an authentic articular aspect tear, but again this constituted only about 20% of thickness of the cuff and I did not feel this was worthy of repair.  He did have some tearing of the biceps tendon, but this constituted maybe 10% of thickness of this structure and that was debrided as well.  The shoulder was thoroughly irrigated, followed by reapproximation of portals loosely with nylon.  Adaptic was applied, followed  by dry gauze and tape.  Estimated blood loss and fluids can be obtained from anesthesia records.  DISPOSITION:  The patient was extubated in operating room and taken to recovery room in stable addition.  He was to go home on the same-day and follow up in the office closely.  I will contact him by phone tonight.     Lubertha Basque Jerl Santos, M.D.     PGD/MEDQ  D:  09/19/2011  T:  09/19/2011  Job:  [id number]

## 2011-09-22 LAB — POCT HEMOGLOBIN-HEMACUE: Hemoglobin: 13.8 g/dL (ref 13.0–17.0)

## 2012-06-03 ENCOUNTER — Encounter: Payer: Self-pay | Admitting: Internal Medicine

## 2012-06-03 ENCOUNTER — Ambulatory Visit (INDEPENDENT_AMBULATORY_CARE_PROVIDER_SITE_OTHER): Payer: Medicare Other | Admitting: Internal Medicine

## 2012-06-03 VITALS — BP 142/90 | HR 87 | Temp 97.6°F | Resp 18 | Wt 214.0 lb

## 2012-06-03 DIAGNOSIS — R350 Frequency of micturition: Secondary | ICD-10-CM

## 2012-06-03 DIAGNOSIS — R82998 Other abnormal findings in urine: Secondary | ICD-10-CM

## 2012-06-03 DIAGNOSIS — R8290 Unspecified abnormal findings in urine: Secondary | ICD-10-CM

## 2012-06-03 LAB — POCT URINALYSIS DIPSTICK
Bilirubin, UA: NEGATIVE
Blood, UA: NEGATIVE
Glucose, UA: NEGATIVE
Ketones, UA: NEGATIVE
Leukocytes, UA: NEGATIVE
Nitrite, UA: NEGATIVE
Spec Grav, UA: 1.015
Urobilinogen, UA: 0.2
pH, UA: 6

## 2012-06-03 MED ORDER — CIPROFLOXACIN HCL 500 MG PO TABS: 500.0000 mg | ORAL_TABLET | Freq: Two times a day (BID) | ORAL | Status: DC

## 2012-06-03 NOTE — Patient Instructions (Signed)
Take your antibiotic as prescribed until ALL of it is gone, but stop if you develop a rash, swelling, or any side effects of the medication.  Contact our office as soon as possible if  there are side effects of the medication. 

## 2012-06-03 NOTE — Progress Notes (Signed)
Subjective:    Patient ID: [name], male    DOB: 03/17/1946, 67 y.o.   MRN: [id number]  HPI  67 year old patient who presents for weakness history of urinary frequency urgency and some dysuria. Has also noticed some mild low back and lower bowel discomfort. His main complaint is urgency  and a darker more odorous urine. No history UTIs but he states that he has had prostate infections in the past  Past Medical History  Diagnosis Date  . ALLERGIC RHINITIS 06/17/2009  . GOUT 05/04/2008  . LOW BACK PAIN 05/04/2008  . OSTEOARTHRITIS 05/04/2008  . PULMONARY NODULE, LEFT LOWER LOBE 01/04/2010  . TESTICULAR HYPOFUNCTION 01/04/2010  . WEIGHT LOSS 03/17/2009  . Night sweats   . Abdominal pain   . Complication of anesthesia     hallucination with a med started with H    History   Social History  . Marital Status: Married    Spouse Name: N/A    Number of Children: N/A  . Years of Education: N/A   Occupational History  . Not on file.   Social History Main Topics  . Smoking status: Never Smoker   . Smokeless tobacco: Never Used  . Alcohol Use: 5.0 oz/week    10 drink(s) per week  . Drug Use: No  . Sexually Active: Not on file   Other Topics Concern  . Not on file   Social History Narrative  . No narrative on file    Past Surgical History  Procedure Laterality Date  . Colostomy    . Knee surgery      3 on each knee  . Ankle surgery      4 times  . Elbow surgery      torn tendon  . Tonsillectomy and adenoidectomy    . Inguinal hernia repair      right    History reviewed. No pertinent family history.  No Known Allergies  Current Outpatient Prescriptions on File Prior to Visit  Medication Sig Dispense Refill  . allopurinol (ZYLOPRIM) 100 MG tablet Take 100 mg by mouth daily. 5 once daily       . aspirin 325 MG tablet Take 325 mg by mouth daily.        . Clobetasol Prop Emollient Base 0.05 % emollient cream Apply topically as needed.        . colchicine 0.6 MG  tablet Take 0.6 mg by mouth as needed. gout      . flunisolide (NASALIDE) 0.025 % SOLN       . fluocinonide ointment (LIDEX) 0.05 %       . ibuprofen (ADVIL,MOTRIN) 600 MG tablet Take 600 mg by mouth every 8 (eight) hours as needed.        . indomethacin (INDOCIN) 50 MG capsule Take 1 capsule (50 mg total) by mouth 3 (three) times daily as needed.  90 capsule  1  . methocarbamol (ROBAXIN) 750 MG tablet Take 750 mg by mouth as needed.        . Multiple Vitamin (MULTIVITAMIN) tablet Take 1 tablet by mouth daily.      . ranitidine (ZANTAC) 150 MG tablet Take 150 mg by mouth 2 (two) times daily.       No current facility-administered medications on file prior to visit.    BP 142/90  Pulse 87  Temp(Src) 97.6 F (36.4 C) (Oral)  Resp 18  Wt 214 lb (97.07 kg)  BMI 29.02 kg/m2  SpO2 98%  Review of Systems  Constitutional: Positive for chills and fatigue. Negative for fever and appetite change.  HENT: Negative for hearing loss, ear pain, congestion, sore throat, trouble swallowing, neck stiffness, dental problem, voice change and tinnitus.   Eyes: Negative for pain, discharge and visual disturbance.  Respiratory: Negative for cough, chest tightness, wheezing and stridor.   Cardiovascular: Negative for chest pain, palpitations and leg swelling.  Gastrointestinal: Negative for nausea, vomiting, abdominal pain, diarrhea, constipation, blood in stool and abdominal distention.  Genitourinary: Positive for dysuria, urgency and frequency. Negative for hematuria, flank pain, discharge, difficulty urinating and genital sores.  Musculoskeletal: Negative for myalgias, back pain, joint swelling, arthralgias and gait problem.  Skin: Negative for rash.  Neurological: Negative for dizziness, syncope, speech difficulty, weakness, numbness and headaches.  Hematological: Negative for adenopathy. Does not bruise/bleed easily.  Psychiatric/Behavioral: Negative for behavioral problems and dysphoric  mood. The patient is not nervous/anxious.        Objective:   Physical Exam  Constitutional: He is oriented to person, place, and time. He appears well-developed.  HENT:  Head: Normocephalic.  Right Ear: External ear normal.  Left Ear: External ear normal.  Eyes: Conjunctivae and EOM are normal.  Neck: Normal range of motion.  Cardiovascular: Normal rate and normal heart sounds.   Pulmonary/Chest: Breath sounds normal.  Abdominal: Bowel sounds are normal.  Mild left lower quadrant tenderness  Musculoskeletal: Normal range of motion. He exhibits no edema and no tenderness.  Neurological: He is alert and oriented to person, place, and time.  Psychiatric: He has a normal mood and affect. His behavior is normal.          Assessment & Plan:   Urgency with dysuria. History of chills. UA fairly unremarkable. History of prostatitis. Will treat with a short course of Cipro and observe

## 2012-06-05 ENCOUNTER — Telehealth: Payer: Self-pay | Admitting: Internal Medicine

## 2012-06-05 NOTE — Telephone Encounter (Signed)
pls advise

## 2012-06-05 NOTE — Telephone Encounter (Signed)
Caller: L[place]on/Patient; Phone: [phone]; Reason for Call: Pt calling today 06/05/12 regarding was seen in office on 06/03/12 by Dr.  Amador Cunas and diagnosed with UTI and he prescribed Cipro x3 days.  He is taking his last pill today and still having some frequency.  Wants to know if different medication needs to be prescribed or if this medication should be extended.  Denies triage assessment.  Afebrile.  Pharmacy is [facility] on [address].  PLEASE CALL PT BACK AT [phone] TO ADVISE.  Thanks.

## 2012-06-06 ENCOUNTER — Other Ambulatory Visit: Payer: Self-pay | Admitting: Internal Medicine

## 2012-06-06 MED ORDER — TAMSULOSIN HCL 0.4 MG PO CAPS: 0.4000 mg | ORAL_CAPSULE | Freq: Every day | ORAL | Status: DC

## 2012-06-06 NOTE — Telephone Encounter (Signed)
Notify patient that a new prescription has been called in

## 2012-06-06 NOTE — Telephone Encounter (Signed)
Med has not been called in. Pls advise. Pt would like a call when this is done.

## 2012-06-06 NOTE — Telephone Encounter (Signed)
Please call in a new prescription for generic Flomax 0.4 mg #60 one daily

## 2012-06-06 NOTE — Telephone Encounter (Signed)
Rx sent to pharmacy   

## 2012-06-06 NOTE — Telephone Encounter (Signed)
New Rx called in

## 2012-06-06 NOTE — Telephone Encounter (Signed)
Pt called several times. Not sure if new med or same Cipro to be called in. Pls advise.

## 2012-11-28 ENCOUNTER — Other Ambulatory Visit: Payer: TRICARE For Life (TFL)

## 2012-12-05 ENCOUNTER — Encounter: Payer: TRICARE For Life (TFL) | Admitting: Internal Medicine

## 2013-01-21 ENCOUNTER — Telehealth: Payer: Self-pay | Admitting: Internal Medicine

## 2013-01-21 NOTE — Telephone Encounter (Signed)
Patient Information:  Caller Name: Tyee  Phone: [phone]  Patient: [name], [name]  Gender: Male  DOB: February 07, 1946  Age: 67 Years  PCP: Eleonore Chiquito (Family Practice > 65yrs old)  Office Follow Up:  Does the office need to follow up with this patient?: Yes  Instructions For The Office: Please call for an appointment.  RN Note:  Pt is having bilateral swelling of his feet and calves. Breathing easy.  Symptoms  Reason For Call & Symptoms: Pt has leg pain and swelling.   Reviewed Health History In EMR: Yes  Reviewed Medications In EMR: Yes  Reviewed Allergies In EMR: Yes  Reviewed Surgeries / Procedures: Yes  Date of Onset of Symptoms: 01/14/2013  Guideline(s) Used:  Leg Swelling and Edema  Disposition Per Guideline:   See Today in Office  Reason For Disposition Reached:   Moderate swelling of both ankles (e.g., swelling extends up to the knees) AND new onset or worsening  Advice Given:  Call Back If:  You become worse.  Patient Will Follow Care Advice:  YES

## 2013-01-21 NOTE — Telephone Encounter (Signed)
No appt for Dr Kirtland Bouchard. Do you want to open one up at noon?

## 2013-01-21 NOTE — Telephone Encounter (Signed)
Noted  

## 2013-01-21 NOTE — Telephone Encounter (Signed)
Pt will arrive at 8:50am

## 2013-01-22 ENCOUNTER — Encounter: Payer: Self-pay | Admitting: Internal Medicine

## 2013-01-22 ENCOUNTER — Ambulatory Visit (INDEPENDENT_AMBULATORY_CARE_PROVIDER_SITE_OTHER): Payer: Medicare Other | Admitting: Internal Medicine

## 2013-01-22 VITALS — BP 142/80 | HR 76 | Temp 98.5°F | Resp 20 | Wt 226.0 lb

## 2013-01-22 DIAGNOSIS — R609 Edema, unspecified: Secondary | ICD-10-CM

## 2013-01-22 DIAGNOSIS — R600 Localized edema: Secondary | ICD-10-CM

## 2013-01-22 DIAGNOSIS — M545 Low back pain: Secondary | ICD-10-CM

## 2013-01-22 DIAGNOSIS — M1990 Unspecified osteoarthritis, unspecified site: Secondary | ICD-10-CM

## 2013-01-22 LAB — COMPREHENSIVE METABOLIC PANEL
ALT: 20 U/L (ref 0–53)
AST: 31 U/L (ref 0–37)
Albumin: 3.4 g/dL — ABNORMAL LOW (ref 3.5–5.2)
Alkaline Phosphatase: 98 U/L (ref 39–117)
BUN: 16 mg/dL (ref 6–23)
CO2: 31 mEq/L (ref 19–32)
Calcium: 9.2 mg/dL (ref 8.4–10.5)
Chloride: 99 mEq/L (ref 96–112)
Creatinine, Ser: 0.8 mg/dL (ref 0.4–1.5)
GFR: 100.85 mL/min (ref >60.00)
Glucose, Bld: 89 mg/dL (ref 70–99)
Potassium: 4.1 mEq/L (ref 3.5–5.1)
Sodium: 139 mEq/L (ref 135–145)
Total Bilirubin: 0.8 mg/dL (ref 0.3–1.2)
Total Protein: 6.8 g/dL (ref 6.0–8.3)

## 2013-01-22 LAB — CBC WITH DIFFERENTIAL/PLATELET
Basophils Absolute: 0 10*3/uL (ref 0.0–0.1)
Basophils Relative: 1 % (ref 0.0–3.0)
Eosinophils Absolute: 0.1 10*3/uL (ref 0.0–0.7)
Eosinophils Relative: 3.1 % (ref 0.0–5.0)
HCT: 35.1 % — ABNORMAL LOW (ref 39.0–52.0)
Hemoglobin: 12.2 g/dL — ABNORMAL LOW (ref 13.0–17.0)
Lymphocytes Relative: 45.7 % (ref 12.0–46.0)
Lymphs Abs: 2.1 10*3/uL (ref 0.7–4.0)
MCHC: 34.8 g/dL (ref 30.0–36.0)
MCV: 99.3 fl (ref 78.0–100.0)
Monocytes Absolute: 0.5 10*3/uL (ref 0.1–1.0)
Monocytes Relative: 10.6 % (ref 3.0–12.0)
Neutro Abs: 1.8 10*3/uL (ref 1.4–7.7)
Neutrophils Relative %: 39.6 % — ABNORMAL LOW (ref 43.0–77.0)
Platelets: 257 10*3/uL (ref 150.0–400.0)
RBC: 3.54 Mil/uL — ABNORMAL LOW (ref 4.22–5.81)
RDW: 13.4 % (ref 11.5–14.6)
WBC: 4.5 10*3/uL (ref 4.5–10.5)

## 2013-01-22 LAB — TSH: TSH: 3.38 u[IU]/mL (ref 0.35–5.50)

## 2013-01-22 MED ORDER — FUROSEMIDE 40 MG PO TABS: 40.0000 mg | ORAL_TABLET | Freq: Every day | ORAL | Status: DC

## 2013-01-22 NOTE — Progress Notes (Signed)
Subjective:    Patient ID: [name], male    DOB: 1945-07-20, 67 y.o.   MRN: [id number]  HPI  67 year old patient who presents with a chief complaint of lower extremity edema. He states this has been present for about one month and perhaps started prior to recent surgery 3 weeks ago involving his left hand. He does use anti-inflammatory medications but only sporadically. He has history of gout which has been stable. Denies any cardiopulmonary complaints. No recent lab. No prior history of lower extremity edema.  Past Medical History  Diagnosis Date  . ALLERGIC RHINITIS 06/17/2009  . GOUT 05/04/2008  . LOW BACK PAIN 05/04/2008  . OSTEOARTHRITIS 05/04/2008  . PULMONARY NODULE, LEFT LOWER LOBE 01/04/2010  . TESTICULAR HYPOFUNCTION 01/04/2010  . WEIGHT LOSS 03/17/2009  . Night sweats   . Abdominal pain   . Complication of anesthesia     hallucination with a med started with H    History   Social History  . Marital Status: Married    Spouse Name: N/A    Number of Children: N/A  . Years of Education: N/A   Occupational History  . Not on file.   Social History Main Topics  . Smoking status: Never Smoker   . Smokeless tobacco: Never Used  . Alcohol Use: 5.0 oz/week    10 drink(s) per week  . Drug Use: No  . Sexual Activity: Not on file   Other Topics Concern  . Not on file   Social History Narrative  . No narrative on file    Past Surgical History  Procedure Laterality Date  . Colostomy    . Knee surgery      3 on each knee  . Ankle surgery      4 times  . Elbow surgery      torn tendon  . Tonsillectomy and adenoidectomy    . Inguinal hernia repair      right    History reviewed. No pertinent family history.  No Known Allergies  Current Outpatient Prescriptions on File Prior to Visit  Medication Sig Dispense Refill  . allopurinol (ZYLOPRIM) 100 MG tablet Take 100 mg by mouth daily. 5 once daily       . aspirin 325 MG tablet Take 325 mg by mouth daily.         . Clobetasol Prop Emollient Base 0.05 % emollient cream Apply topically as needed.        . colchicine 0.6 MG tablet Take 0.6 mg by mouth as needed. gout      . flunisolide (NASALIDE) 0.025 % SOLN       . fluocinonide ointment (LIDEX) 0.05 %       . ibuprofen (ADVIL,MOTRIN) 600 MG tablet Take 600 mg by mouth every 8 (eight) hours as needed.        . indomethacin (INDOCIN) 50 MG capsule Take 1 capsule (50 mg total) by mouth 3 (three) times daily as needed.  90 capsule  1  . methocarbamol (ROBAXIN) 750 MG tablet Take 750 mg by mouth as needed.        . Multiple Vitamin (MULTIVITAMIN) tablet Take 1 tablet by mouth daily.      . ranitidine (ZANTAC) 150 MG tablet Take 150 mg by mouth 2 (two) times daily.       No current facility-administered medications on file prior to visit.    BP 142/80  Pulse 76  Temp(Src) 98.5 F (36.9 C) (Oral)  Resp 20  Wt 226 lb (102.513 kg)  BMI 30.64 kg/m2  SpO2 98%       Review of Systems  Constitutional: Negative for fever, chills, appetite change and fatigue.  HENT: Negative for congestion, dental problem, ear pain, hearing loss, sore throat, tinnitus, trouble swallowing and voice change.   Eyes: Negative for pain, discharge and visual disturbance.  Respiratory: Negative for cough, chest tightness, wheezing and stridor.   Cardiovascular: Positive for leg swelling. Negative for chest pain and palpitations.  Gastrointestinal: Negative for nausea, vomiting, abdominal pain, diarrhea, constipation, blood in stool and abdominal distention.  Genitourinary: Negative for urgency, hematuria, flank pain, discharge, difficulty urinating and genital sores.  Musculoskeletal: Negative for arthralgias, back pain, gait problem, joint swelling, myalgias and neck stiffness.  Skin: Negative for rash.  Neurological: Negative for dizziness, syncope, speech difficulty, weakness, numbness and headaches.  Hematological: Negative for adenopathy. Does not bruise/bleed easily.   Psychiatric/Behavioral: Negative for behavioral problems and dysphoric mood. The patient is not nervous/anxious.        Objective:   Physical Exam  Constitutional: He is oriented to person, place, and time. He appears well-developed.  HENT:  Head: Normocephalic.  Right Ear: External ear normal.  Left Ear: External ear normal.  Eyes: Conjunctivae and EOM are normal.  Neck: Normal range of motion. No JVD present.  Cardiovascular: Normal rate and normal heart sounds.   Pulmonary/Chest: Effort normal and breath sounds normal. No respiratory distress. He has no wheezes. He has no rales.  Abdominal: Bowel sounds are normal.  Musculoskeletal: Normal range of motion. He exhibits edema. He exhibits no tenderness.  Prominent edema distal to both knees  Neurological: He is alert and oriented to person, place, and time.  Psychiatric: He has a normal mood and affect. His behavior is normal.          Assessment & Plan:   Lower extremity edema. We'll check some updated lab. Will place on diuretic therapy and salt restriction and recheck in 2 weeks. Depending on clinical response we'll consider further evaluation if unimproved. We'll moderate use of anti-inflammatory medications

## 2013-01-22 NOTE — Patient Instructions (Signed)
Limit your sodium (Salt) intake  Furosemide once daily in the morning  Return in 2 weeks for followup  Call if there is any clinical worsening2 Gram Low Sodium Diet A 2 gram sodium diet restricts the amount of sodium in the diet to no more than 2 g or 2000 mg daily. Limiting the amount of sodium is often used to help lower blood pressure. It is important if you have heart, liver, or kidney problems. Many foods contain sodium for flavor and sometimes as a preservative. When the amount of sodium in a diet needs to be low, it is important to know what to look for when choosing foods and drinks. The following includes some information and guidelines to help make it easier for you to adapt to a low sodium diet. QUICK TIPS  Do not add salt to food.  Avoid convenience items and fast food.  Choose unsalted snack foods.  Buy lower sodium products, often labeled as "lower sodium" or "no salt added."  Check food labels to learn how much sodium is in 1 serving.  When eating at a restaurant, ask that your food be prepared with less salt or none, if possible. READING FOOD LABELS FOR SODIUM INFORMATION The nutrition facts label is a good place to find how much sodium is in foods. Look for products with no more than 500 to 600 mg of sodium per meal and no more than 150 mg per serving. Remember that 2 g = 2000 mg. The food label may also list foods as:  Sodium-free: Less than 5 mg in a serving.  Very low sodium: 35 mg or less in a serving.  Low-sodium: 140 mg or less in a serving.  Light in sodium: 50% less sodium in a serving. For example, if a food that usually has 300 mg of sodium is changed to become light in sodium, it will have 150 mg of sodium.  Reduced sodium: 25% less sodium in a serving. For example, if a food that usually has 400 mg of sodium is changed to reduced sodium, it will have 300 mg of sodium. CHOOSING FOODS Grains  Avoid: Salted crackers and snack items. Some cereals,  including instant hot cereals. Bread stuffing and biscuit mixes. Seasoned rice or pasta mixes.  Choose: Unsalted snack items. Low-sodium cereals, oats, puffed wheat and rice, shredded wheat. English muffins and bread. Pasta. Meats  Avoid: Salted, canned, smoked, spiced, pickled meats, including fish and poultry. Bacon, ham, sausage, cold cuts, hot dogs, anchovies.  Choose: Low-sodium canned tuna and salmon. Fresh or frozen meat, poultry, and fish. Dairy  Avoid: Processed cheese and spreads. Cottage cheese. Buttermilk and condensed milk. Regular cheese.  Choose: Milk. Low-sodium cottage cheese. Yogurt. Sour cream. Low-sodium cheese. Fruits and Vegetables  Avoid: Regular canned vegetables. Regular canned tomato sauce and paste. Frozen vegetables in sauces. Olives. Rosita Fire. Relishes. Sauerkraut.  Choose: Low-sodium canned vegetables. Low-sodium tomato sauce and paste. Frozen or fresh vegetables. Fresh and frozen fruit. Condiments  Avoid: Canned and packaged gravies. Worcestershire sauce. Tartar sauce. Barbecue sauce. Soy sauce. Steak sauce. Ketchup. Onion, garlic, and table salt. Meat flavorings and tenderizers.  Choose: Fresh and dried herbs and spices. Low-sodium varieties of mustard and ketchup. Lemon juice. Tabasco sauce. Horseradish. SAMPLE 2 GRAM SODIUM MEAL PLAN Breakfast / Sodium (mg)  1 cup low-fat milk / 143 mg  2 slices whole-wheat toast / 270 mg  1 tbs heart-healthy margarine / 153 mg  1 hard-boiled egg / 139 mg  1 small orange /  0 mg Lunch / Sodium (mg)  1 cup raw carrots / 76 mg   cup hummus / 298 mg  1 cup low-fat milk / 143 mg   cup red grapes / 2 mg  1 whole-wheat pita bread / 356 mg Dinner / Sodium (mg)  1 cup whole-wheat pasta / 2 mg  1 cup low-sodium tomato sauce / 73 mg  3 oz lean ground beef / 57 mg  1 small side salad (1 cup raw spinach leaves,  cup cucumber,  cup yellow bell pepper) with 1 tsp olive oil and 1 tsp red wine vinegar / 25  mg Snack / Sodium (mg)  1 container low-fat vanilla yogurt / 107 mg  3 graham cracker squares / 127 mg Nutrient Analysis  Calories: 2033  Protein: 77 g  Carbohydrate: 282 g  Fat: 72 g  Sodium: 1971 mg Document Released: 03/20/2005 Document Revised: 06/12/2011 Document Reviewed: 06/21/2009 [employer] Patient Information 2014 [place], [place].

## 2013-01-28 ENCOUNTER — Other Ambulatory Visit (INDEPENDENT_AMBULATORY_CARE_PROVIDER_SITE_OTHER): Payer: Medicare Other

## 2013-01-28 DIAGNOSIS — Z1211 Encounter for screening for malignant neoplasm of colon: Secondary | ICD-10-CM

## 2013-01-29 LAB — POC HEMOCCULT BLD/STL (HOME/3-CARD/SCREEN)
Card #1 Date: NEGATIVE
Card #2 Date: NEGATIVE
Card #2 Fecal Occult Blod, POC: NEGATIVE
Card #3 Date: NEGATIVE
Card #3 Fecal Occult Blood, POC: NEGATIVE
Fecal Occult Blood, POC: NEGATIVE

## 2013-02-05 ENCOUNTER — Encounter: Payer: Self-pay | Admitting: Internal Medicine

## 2013-02-05 ENCOUNTER — Ambulatory Visit (INDEPENDENT_AMBULATORY_CARE_PROVIDER_SITE_OTHER): Payer: Medicare Other | Admitting: Internal Medicine

## 2013-02-05 VITALS — BP 130/72 | HR 92 | Temp 98.0°F | Resp 20 | Wt 214.0 lb

## 2013-02-05 DIAGNOSIS — R531 Weakness: Secondary | ICD-10-CM

## 2013-02-05 DIAGNOSIS — D518 Other vitamin B12 deficiency anemias: Secondary | ICD-10-CM

## 2013-02-05 DIAGNOSIS — Z23 Encounter for immunization: Secondary | ICD-10-CM

## 2013-02-05 DIAGNOSIS — R5381 Other malaise: Secondary | ICD-10-CM

## 2013-02-05 DIAGNOSIS — D649 Anemia, unspecified: Secondary | ICD-10-CM

## 2013-02-05 DIAGNOSIS — M1990 Unspecified osteoarthritis, unspecified site: Secondary | ICD-10-CM

## 2013-02-05 DIAGNOSIS — M545 Low back pain: Secondary | ICD-10-CM

## 2013-02-05 DIAGNOSIS — D519 Vitamin B12 deficiency anemia, unspecified: Secondary | ICD-10-CM

## 2013-02-05 LAB — CBC WITH DIFFERENTIAL/PLATELET
Basophils Absolute: 0 10*3/uL (ref 0.0–0.1)
Basophils Relative: 0.3 % (ref 0.0–3.0)
Eosinophils Absolute: 0.1 10*3/uL (ref 0.0–0.7)
Eosinophils Relative: 0.9 % (ref 0.0–5.0)
HCT: 37.4 % — ABNORMAL LOW (ref 39.0–52.0)
Hemoglobin: 12.9 g/dL — ABNORMAL LOW (ref 13.0–17.0)
Lymphocytes Relative: 20.8 % (ref 12.0–46.0)
Lymphs Abs: 1.5 10*3/uL (ref 0.7–4.0)
MCHC: 34.4 g/dL (ref 30.0–36.0)
MCV: 98.8 fl (ref 78.0–100.0)
Monocytes Absolute: 0.6 10*3/uL (ref 0.1–1.0)
Monocytes Relative: 9 % (ref 3.0–12.0)
Neutro Abs: 4.9 10*3/uL (ref 1.4–7.7)
Neutrophils Relative %: 69 % (ref 43.0–77.0)
Platelets: 220 10*3/uL (ref 150.0–400.0)
RBC: 3.79 Mil/uL — ABNORMAL LOW (ref 4.22–5.81)
RDW: 13.3 % (ref 11.5–14.6)
WBC: 7.2 10*3/uL (ref 4.5–10.5)

## 2013-02-05 LAB — VITAMIN B12: Vitamin B-12: 197 pg/mL — ABNORMAL LOW (ref 211–911)

## 2013-02-05 LAB — FERRITIN: Ferritin: 262.9 ng/mL (ref 22.0–322.0)

## 2013-02-05 NOTE — Progress Notes (Signed)
Subjective:    Patient ID: [name], male    DOB: 07-21-1945, 67 y.o.   MRN: [id number]  HPI Pre-visit discussion using our clinic review tool. No additional management support is needed unless otherwise documented below in the visit note.  This 67 year old patient who is seen today in followup. He was seen 2 weeks ago and at that time gave a two-week history of increasing fatigue. He was seen at the [facility] recently her laboratory studies were fairly unremarkable. However he was noted to have mild anemia at that office visit. He did return slides for occult blood that were all negative. He states that he has had a colonoscopy within the past one or 2 years. He does have osteoarthritis and low back pain and has received both Indocin and ibuprofen from the [facility] but he has not taken recently. Denies any melena. He still complains of fatigue now of 4 weeks duration  Past Medical History  Diagnosis Date  . ALLERGIC RHINITIS 06/17/2009  . GOUT 05/04/2008  . LOW BACK PAIN 05/04/2008  . OSTEOARTHRITIS 05/04/2008  . PULMONARY NODULE, LEFT LOWER LOBE 01/04/2010  . TESTICULAR HYPOFUNCTION 01/04/2010  . WEIGHT LOSS 03/17/2009  . Night sweats   . Abdominal pain   . Complication of anesthesia     hallucination with a med started with H    History   Social History  . Marital Status: Married    Spouse Name: N/A    Number of Children: N/A  . Years of Education: N/A   Occupational History  . Not on file.   Social History Main Topics  . Smoking status: Never Smoker   . Smokeless tobacco: Never Used  . Alcohol Use: 5.0 oz/week    10 drink(s) per week  . Drug Use: No  . Sexual Activity: Not on file   Other Topics Concern  . Not on file   Social History Narrative  . No narrative on file    Past Surgical History  Procedure Laterality Date  . Colostomy    . Knee surgery      3 on each knee  . Ankle surgery      4 times  . Elbow surgery      torn tendon  . Tonsillectomy  and adenoidectomy    . Inguinal hernia repair      right    History reviewed. No pertinent family history.  No Known Allergies  Current Outpatient Prescriptions on File Prior to Visit  Medication Sig Dispense Refill  . allopurinol (ZYLOPRIM) 100 MG tablet Take 100 mg by mouth daily. 5 once daily       . aspirin 325 MG tablet Take 325 mg by mouth daily.        . Clobetasol Prop Emollient Base 0.05 % emollient cream Apply topically as needed.        . colchicine 0.6 MG tablet Take 0.6 mg by mouth as needed. gout      . flunisolide (NASALIDE) 0.025 % SOLN       . fluocinonide ointment (LIDEX) 0.05 %       . furosemide (LASIX) 40 MG tablet Take 1 tablet (40 mg total) by mouth daily.  30 tablet  3  . ibuprofen (ADVIL,MOTRIN) 600 MG tablet Take 600 mg by mouth every 8 (eight) hours as needed.        . indomethacin (INDOCIN) 50 MG capsule Take 1 capsule (50 mg total) by mouth 3 (three) times daily as needed.  90 capsule  1  . methocarbamol (ROBAXIN) 750 MG tablet Take 750 mg by mouth as needed.        . Multiple Vitamin (MULTIVITAMIN) tablet Take 1 tablet by mouth daily.      . Oxycodone HCl 10 MG TABS Take 10 mg by mouth every 4 (four) hours as needed.       . ranitidine (ZANTAC) 150 MG tablet Take 150 mg by mouth 2 (two) times daily.      Marland Kitchen terazosin (HYTRIN) 5 MG capsule Take 5 mg by mouth at bedtime.        No current facility-administered medications on file prior to visit.    BP 130/72  Pulse 92  Temp(Src) 98 F (36.7 C) (Oral)  Resp 20  Wt 214 lb (97.07 kg)  SpO2 98%      Review of Systems  Constitutional: Positive for fatigue. Negative for fever, chills and appetite change.  HENT: Negative for congestion, dental problem, ear pain, hearing loss, sore throat, tinnitus, trouble swallowing and voice change.   Eyes: Negative for pain, discharge and visual disturbance.  Respiratory: Negative for cough, chest tightness, wheezing and stridor.   Cardiovascular: Negative for  chest pain, palpitations and leg swelling.  Gastrointestinal: Negative for nausea, vomiting, abdominal pain, diarrhea, constipation, blood in stool and abdominal distention.       Has had some much looser stool over the past 4 weeks  Genitourinary: Negative for urgency, hematuria, flank pain, discharge, difficulty urinating and genital sores.  Musculoskeletal: Negative for arthralgias, back pain, gait problem, joint swelling, myalgias and neck stiffness.  Skin: Negative for rash.  Neurological: Negative for dizziness, syncope, speech difficulty, weakness, numbness and headaches.  Hematological: Negative for adenopathy. Does not bruise/bleed easily.  Psychiatric/Behavioral: Negative for behavioral problems and dysphoric mood. The patient is not nervous/anxious.        Objective:   Physical Exam  Constitutional: He is oriented to person, place, and time. He appears well-developed.  HENT:  Head: Normocephalic.  Right Ear: External ear normal.  Left Ear: External ear normal.  Eyes: Conjunctivae and EOM are normal.  Neck: Normal range of motion.  Cardiovascular: Normal rate and normal heart sounds.   Pulmonary/Chest: Breath sounds normal.  Abdominal: Bowel sounds are normal.  Musculoskeletal: Normal range of motion. He exhibits no edema and no tenderness.  Neurological: He is alert and oriented to person, place, and time.  Psychiatric: He has a normal mood and affect. His behavior is normal.          Assessment & Plan:   Anemia. We'll recheck her H&H and also check a ferritin and B12 level Fatigue. Patient does have a history of intermittent weakness over the years. We'll recheck in one month Osteoarthritis. The patient was told to withhold all potential gastric irritants. Continue ranitidine

## 2013-02-05 NOTE — Patient Instructions (Signed)
It is important that you exercise regularly, at least 20 minutes 3 to 4 times per week.  If you develop chest pain or shortness of breath seek  medical attention.  Return in one month for follow-up  

## 2013-02-07 ENCOUNTER — Ambulatory Visit (INDEPENDENT_AMBULATORY_CARE_PROVIDER_SITE_OTHER): Payer: Medicare Other | Admitting: *Deleted

## 2013-02-07 DIAGNOSIS — E538 Deficiency of other specified B group vitamins: Secondary | ICD-10-CM

## 2013-02-07 MED ORDER — CYANOCOBALAMIN 1000 MCG/ML IJ SOLN
1000.0000 ug | Freq: Once | INTRAMUSCULAR | Status: AC
  Administered 2013-02-07: 1000 ug via INTRAMUSCULAR

## 2013-02-14 ENCOUNTER — Ambulatory Visit (INDEPENDENT_AMBULATORY_CARE_PROVIDER_SITE_OTHER): Payer: Medicare Other | Admitting: *Deleted

## 2013-02-14 DIAGNOSIS — E538 Deficiency of other specified B group vitamins: Secondary | ICD-10-CM

## 2013-02-14 MED ORDER — CYANOCOBALAMIN 1000 MCG/ML IJ SOLN
1000.0000 ug | Freq: Once | INTRAMUSCULAR | Status: AC
  Administered 2013-02-14: 1000 ug via INTRAMUSCULAR

## 2013-02-21 ENCOUNTER — Ambulatory Visit: Payer: Medicare Other | Admitting: *Deleted

## 2013-02-21 DIAGNOSIS — E538 Deficiency of other specified B group vitamins: Secondary | ICD-10-CM

## 2013-02-21 MED ORDER — CYANOCOBALAMIN 1000 MCG/ML IJ SOLN
1000.0000 ug | Freq: Once | INTRAMUSCULAR | Status: AC
  Administered 2013-02-21: 1000 ug via INTRAMUSCULAR

## 2013-02-28 ENCOUNTER — Ambulatory Visit (INDEPENDENT_AMBULATORY_CARE_PROVIDER_SITE_OTHER): Payer: Medicare Other | Admitting: *Deleted

## 2013-02-28 ENCOUNTER — Encounter: Payer: Self-pay | Admitting: Internal Medicine

## 2013-02-28 ENCOUNTER — Ambulatory Visit (HOSPITAL_COMMUNITY)
Admission: RE | Admit: 2013-02-28 | Discharge: 2013-02-28 | Disposition: A | Discharge status: Home / Self Care | Payer: Medicare Other | Source: Ambulatory Visit | Attending: Internal Medicine | Admitting: Internal Medicine

## 2013-02-28 ENCOUNTER — Ambulatory Visit (INDEPENDENT_AMBULATORY_CARE_PROVIDER_SITE_OTHER): Payer: Medicare Other | Admitting: Internal Medicine

## 2013-02-28 VITALS — BP 138/76 | HR 89 | Temp 97.6°F | Ht 72.0 in | Wt 211.5 lb

## 2013-02-28 DIAGNOSIS — L299 Pruritus, unspecified: Secondary | ICD-10-CM

## 2013-02-28 DIAGNOSIS — E538 Deficiency of other specified B group vitamins: Secondary | ICD-10-CM

## 2013-02-28 DIAGNOSIS — M7989 Other specified soft tissue disorders: Secondary | ICD-10-CM

## 2013-02-28 DIAGNOSIS — L03115 Cellulitis of right lower limb: Secondary | ICD-10-CM

## 2013-02-28 DIAGNOSIS — M129 Arthropathy, unspecified: Secondary | ICD-10-CM

## 2013-02-28 DIAGNOSIS — L02419 Cutaneous abscess of limb, unspecified: Secondary | ICD-10-CM

## 2013-02-28 DIAGNOSIS — M1990 Unspecified osteoarthritis, unspecified site: Secondary | ICD-10-CM

## 2013-02-28 MED ORDER — CYANOCOBALAMIN 1000 MCG/ML IJ SOLN
1000.0000 ug | Freq: Once | INTRAMUSCULAR | Status: AC
  Administered 2013-02-28: 1000 ug via INTRAMUSCULAR

## 2013-02-28 MED ORDER — CEPHALEXIN 500 MG PO CAPS: 500.0000 mg | ORAL_CAPSULE | Freq: Three times a day (TID) | ORAL | Status: DC

## 2013-02-28 MED ORDER — HYDROCHLOROTHIAZIDE 25 MG PO TABS: 25.0000 mg | ORAL_TABLET | Freq: Every day | ORAL | Status: DC

## 2013-02-28 NOTE — Addendum Note (Signed)
Addended by: Rene Paci A on: 02/28/2013 05:48 PM   Modules accepted: Orders

## 2013-02-28 NOTE — Progress Notes (Addendum)
Subjective:    Patient ID: [name], male    DOB: [birth year], 67 y.o.   MRN: [id number]  HPI  Complains of right leg Swelling  Onset 1 week ago, worse in the last 48 hours  Reports similar swelling one month ago following surgery, but at that time was in both legs  Prior edema improved with furosemide ( but caused severe leg cramps )  Denies prior history of congestive heart failure or DVT  Denies direct trauma   Past Medical History  Diagnosis Date  . ALLERGIC RHINITIS   . GOUT   . LOW BACK PAIN   . OSTEOARTHRITIS   . PULMONARY NODULE, LEFT LOWER LOBE   . TESTICULAR HYPOFUNCTION      Review of Systems  Constitutional: Negative for fever and fatigue.  Respiratory: Negative for cough and shortness of breath.   Cardiovascular: Positive for leg swelling. Negative for palpitations.  Musculoskeletal: Positive for arthralgias.  Skin:       Itch "all over", but no rash x 1 mo       Objective:   Physical Exam BP 138/76  Pulse 89  Temp(Src) 97.6 F (36.4 C) (Oral)  Ht 6' (1.829 m)  Wt 211 lb 8 oz (95.936 kg)  BMI 28.68 kg/m2  SpO2 97% Wt Readings from Last 3 Encounters:  02/28/13 211 lb 8 oz (95.936 kg)  02/05/13 214 lb (97.07 kg)  01/22/13 226 lb (102.513 kg)   Constitutional: he appears well-developed and well-nourished. No distress.  Neck: Normal range of motion. Neck supple. No JVD present. No thyromegaly present.  Cardiovascular: Normal rate, regular rhythm and normal heart sounds.  No murmur heard. R>L LE swelling distally: distal RLE with tight 2+ shiny edema to knee, mild erythema and increase warmth. Distal LLE trace edema Pulmonary/Chest: Effort normal and breath sounds normal. No respiratory distress. he has no wheezes.  Abdominal: Soft. Bowel sounds are normal. he exhibits no distension. There is no tenderness. no masses Musculoskeletal: right hand with mild synovitis second and third MCP, second third and fourth PIP but without erythema or increased  warmth -Normal range of motion Psychiatric: he has a normal mood and affect. behavior is normal. Judgment and thought content normal.  Lab Results  Component Value Date   WBC 7.2 02/05/2013   HGB 12.9* 02/05/2013   HCT 37.4* 02/05/2013   PLT 220.0 02/05/2013   GLUCOSE 89 01/22/2013   CHOL 237* 01/06/2011   TRIG 904.0* 01/06/2011   HDL 37.70* 01/06/2011   LDLDIRECT 59.3 01/06/2011   ALT 20 01/22/2013   AST 31 01/22/2013   NA 139 01/22/2013   K 4.1 01/22/2013   CL 99 01/22/2013   CREATININE 0.8 01/22/2013   BUN 16 01/22/2013   CO2 31 01/22/2013   TSH 3.38 01/22/2013   PSA 0.32 12/28/2009        Assessment & Plan:   Right calf swelling - >2x size LLE   Asymmetry concerning for DVT  Arrange for venous Doppler today  If DVT, begin oral anticoagulation  If no DVT, will resume diuretics -patient request alternate 2 furosemide because of cramping side effects on furosemide  Patient understands plans for workup and intended treatment options He will go to the emergency room if symptoms worse or unimproved in the next 72 hours  Followup with primary care provider (Dr Kirtland Bouchard) next week as scheduled regarding same, also elbow/ hand arthralgias and concerns of diffuse itch    ADDENDUM:  Doppler negative for DVT  Will tx for cellulitis and edema - keflex and HCTZ - erx done Pt to follow up with PCP next week as planned

## 2013-02-28 NOTE — Progress Notes (Signed)
VASCULAR LAB PRELIMINARY  PRELIMINARY  PRELIMINARY  PRELIMINARY  Right lower extremity venous duplex completed.    Preliminary report:  Right:  No evidence of DVT, superficial thrombosis, or Baker's cyst.  Matthias Bogus, RVS 02/28/2013, 6:13 PM

## 2013-02-28 NOTE — Progress Notes (Signed)
Pre visit review using our clinic review tool, if applicable. No additional management support is needed unless otherwise documented below in the visit note. 

## 2013-02-28 NOTE — Patient Instructions (Signed)
It was good to see you today.  We will arrange for venous Doppler to exclude a blood clot as the cause of your right leg swelling -these results will be relayed to you.   What we due to swelling depends on what the results show [place]: If there is evidence for a blood clot, you will need to start on blood thinners to dissolve the clot If there is no blood clot, we will start you on diuretic (but not furosemide)  Continue indomethacin for arthritic pain until further notice  Followup with Dr. Kirtland Bouchard. next week, please call sooner if problems

## 2013-03-05 ENCOUNTER — Ambulatory Visit: Payer: Medicare Other | Admitting: Internal Medicine

## 2013-03-05 ENCOUNTER — Encounter: Payer: Self-pay | Admitting: Internal Medicine

## 2013-03-05 VITALS — BP 148/80 | HR 80 | Temp 97.6°F | Resp 20 | Wt 214.0 lb

## 2013-03-05 DIAGNOSIS — E538 Deficiency of other specified B group vitamins: Secondary | ICD-10-CM

## 2013-03-05 DIAGNOSIS — M1990 Unspecified osteoarthritis, unspecified site: Secondary | ICD-10-CM

## 2013-03-05 DIAGNOSIS — M109 Gout, unspecified: Secondary | ICD-10-CM

## 2013-03-05 NOTE — Patient Instructions (Addendum)
Limit your sodium (Salt) intake  Return in 6 months for follow-up    It is important that you exercise regularly, at least 20 minutes 3 to 4 times per week.  If you develop chest pain or shortness of breath seek  medical attention.  Rheumatology followup as discussed Vitamin B12 1 mg by mouth daily

## 2013-03-05 NOTE — Progress Notes (Signed)
Pre-visit discussion using our clinic review tool. No additional management support is needed unless otherwise documented below in the visit note.  

## 2013-03-05 NOTE — Progress Notes (Signed)
Subjective:    Patient ID: [name], male    DOB: [birth year], 67 y.o.   MRN: [id number]  HPI Pre-visit discussion using our clinic review tool. No additional management support is needed unless otherwise documented below in the visit note.  67 year old patient who is seen today to discuss B12 deficiency. He has received 4 weekly shots of parenteral vitamin B12. He wishes to discuss other options. He will be leaving for an extended trip to [place] in the summer and wishes to avoid injections if possible.  He has a history of gout and osteoarthritis but more recently has had episodes of diffuse symmetrical polyarthralgias. He is scheduled for rheumatology followup.  Past Medical History  Diagnosis Date  . ALLERGIC RHINITIS   . GOUT   . LOW BACK PAIN   . OSTEOARTHRITIS   . PULMONARY NODULE, LEFT LOWER LOBE   . TESTICULAR HYPOFUNCTION     History   Social History  . Marital Status: Married    Spouse Name: N/A    Number of Children: N/A  . Years of Education: N/A   Occupational History  . Not on file.   Social History Main Topics  . Smoking status: Never Smoker   . Smokeless tobacco: Never Used  . Alcohol Use: 5.0 oz/week    10 drink(s) per week  . Drug Use: No  . Sexual Activity: Not on file   Other Topics Concern  . Not on file   Social History Narrative  . No narrative on file    Past Surgical History  Procedure Laterality Date  . Colostomy    . Knee surgery      3 on each knee  . Ankle surgery      4 times  . Elbow surgery      torn tendon  . Tonsillectomy and adenoidectomy    . Inguinal hernia repair      right    History reviewed. No pertinent family history.  No Known Allergies  Current Outpatient Prescriptions on File Prior to Visit  Medication Sig Dispense Refill  . allopurinol (ZYLOPRIM) 100 MG tablet Take 100 mg by mouth daily. 5 once daily       . aspirin 325 MG tablet Take 325 mg by mouth daily.        . cephALEXin (KEFLEX) 500  MG capsule Take 1 capsule (500 mg total) by mouth 3 (three) times daily.  21 capsule  0  . chlorhexidine (PERIDEX) 0.12 % solution       . Clobetasol Prop Emollient Base 0.05 % emollient cream Apply topically as needed.        . colchicine 0.6 MG tablet Take 0.6 mg by mouth as needed. gout      . flunisolide (NASALIDE) 0.025 % SOLN       . fluocinonide ointment (LIDEX) 0.05 %       . hydrochlorothiazide (HYDRODIURIL) 25 MG tablet Take 1 tablet (25 mg total) by mouth daily.  30 tablet  11  . ibuprofen (ADVIL,MOTRIN) 600 MG tablet Take 600 mg by mouth every 8 (eight) hours as needed.        . indomethacin (INDOCIN) 50 MG capsule Take 1 capsule (50 mg total) by mouth 3 (three) times daily as needed.  90 capsule  1  . methocarbamol (ROBAXIN) 750 MG tablet Take 750 mg by mouth as needed.        . Multiple Vitamin (MULTIVITAMIN) tablet Take 1 tablet by mouth daily.      Marland Kitchen  Oxycodone HCl 10 MG TABS Take 10 mg by mouth every 4 (four) hours as needed.       . ranitidine (ZANTAC) 150 MG tablet Take 150 mg by mouth 2 (two) times daily.      Marland Kitchen terazosin (HYTRIN) 5 MG capsule Take 5 mg by mouth at bedtime.        No current facility-administered medications on file prior to visit.    BP 148/80  Pulse 80  Temp(Src) 97.6 F (36.4 C) (Oral)  Resp 20  Wt 214 lb (97.07 kg)       Review of Systems  Constitutional: Negative for fever, chills, appetite change and fatigue.  HENT: Negative for congestion, dental problem, ear pain, hearing loss, sore throat, tinnitus, trouble swallowing and voice change.   Eyes: Negative for pain, discharge and visual disturbance.  Respiratory: Negative for cough, chest tightness, wheezing and stridor.   Cardiovascular: Negative for chest pain, palpitations and leg swelling.  Gastrointestinal: Negative for nausea, vomiting, abdominal pain, diarrhea, constipation, blood in stool and abdominal distention.  Genitourinary: Negative for urgency, hematuria, flank pain,  discharge, difficulty urinating and genital sores.  Musculoskeletal: Positive for arthralgias. Negative for back pain, gait problem, joint swelling, myalgias and neck stiffness.  Skin: Negative for rash.  Neurological: Negative for dizziness, syncope, speech difficulty, weakness, numbness and headaches.  Hematological: Negative for adenopathy. Does not bruise/bleed easily.  Psychiatric/Behavioral: Negative for behavioral problems and dysphoric mood. The patient is not nervous/anxious.        Objective:   Physical Exam  Constitutional: He appears well-developed and well-nourished. No distress.  Musculoskeletal: He exhibits edema.  Mild right leg edema  Psychiatric: He has a normal mood and affect. His behavior is normal.          Assessment & Plan:   B12 deficiency- options discussed. Will place on vitamin B12 1 mg oral daily. Recheck B12 level in 6 months prior to his international trip Episodic diffuse symmetrical polyarthralgias. Probable RA. Rheumatology consultation has been scheduled History of gout History of osteoarthritis

## 2013-06-11 ENCOUNTER — Telehealth: Payer: Self-pay | Admitting: Oncology

## 2013-06-11 NOTE — Telephone Encounter (Signed)
LEFT MESSAGE FOR PATIENT TO RETURN CALL TO SCHEDULE NEW PATIENT APPT.  °

## 2013-06-12 ENCOUNTER — Telehealth: Payer: Self-pay | Admitting: Oncology

## 2013-06-12 NOTE — Telephone Encounter (Signed)
S/W PATIENT AND GAVE NEW PATIENT APP FOR 03/18 @ 1:30 W/DR. SHADAD.  [facility] ON January LABS WELCOME PACKET MAILED.

## 2013-06-12 NOTE — Telephone Encounter (Signed)
C/D 06/12/13 for appt. 06/18/13

## 2013-06-13 ENCOUNTER — Other Ambulatory Visit: Payer: Self-pay | Admitting: Oncology

## 2013-06-13 DIAGNOSIS — D729 Disorder of white blood cells, unspecified: Secondary | ICD-10-CM

## 2013-06-17 ENCOUNTER — Telehealth: Payer: Self-pay | Admitting: Medical Oncology

## 2013-06-17 NOTE — Telephone Encounter (Signed)
Confirmed tomorrow's appt's with patient, knows to bring current list of medications and informed of our free valet parking. Patient denies questions at this time.

## 2013-06-18 ENCOUNTER — Encounter: Payer: Self-pay | Admitting: Oncology

## 2013-06-18 ENCOUNTER — Other Ambulatory Visit (HOSPITAL_BASED_OUTPATIENT_CLINIC_OR_DEPARTMENT_OTHER): Payer: Medicare Other

## 2013-06-18 ENCOUNTER — Other Ambulatory Visit: Payer: Self-pay | Admitting: Medical Oncology

## 2013-06-18 ENCOUNTER — Ambulatory Visit: Payer: Medicare Other

## 2013-06-18 ENCOUNTER — Ambulatory Visit (HOSPITAL_COMMUNITY)
Admission: RE | Admit: 2013-06-18 | Discharge: 2013-06-18 | Disposition: A | Discharge status: Home / Self Care | Payer: Medicare Other | Source: Ambulatory Visit | Attending: Oncology | Admitting: Oncology

## 2013-06-18 ENCOUNTER — Telehealth: Payer: Self-pay | Admitting: Oncology

## 2013-06-18 ENCOUNTER — Ambulatory Visit (HOSPITAL_BASED_OUTPATIENT_CLINIC_OR_DEPARTMENT_OTHER): Payer: Medicare Other | Admitting: Oncology

## 2013-06-18 VITALS — BP 138/76 | HR 85 | Temp 97.2°F | Resp 18 | Ht 69.5 in | Wt 209.5 lb

## 2013-06-18 DIAGNOSIS — D729 Disorder of white blood cells, unspecified: Secondary | ICD-10-CM

## 2013-06-18 DIAGNOSIS — C9000 Multiple myeloma not having achieved remission: Secondary | ICD-10-CM | POA: Insufficient documentation

## 2013-06-18 DIAGNOSIS — D7289 Other specified disorders of white blood cells: Secondary | ICD-10-CM

## 2013-06-18 LAB — CBC WITH DIFFERENTIAL/PLATELET
BASO%: 1.1 % (ref 0.0–2.0)
Basophils Absolute: 0.1 10*3/uL (ref 0.0–0.1)
EOS%: 3.7 % (ref 0.0–7.0)
Eosinophils Absolute: 0.3 10*3/uL (ref 0.0–0.5)
HCT: 39.7 % (ref 38.4–49.9)
HGB: 13.5 g/dL (ref 13.0–17.1)
LYMPH%: 37.2 % (ref 14.0–49.0)
MCH: 34 pg — ABNORMAL HIGH (ref 27.2–33.4)
MCHC: 34 g/dL (ref 32.0–36.0)
MCV: 100.2 fL — ABNORMAL HIGH (ref 79.3–98.0)
MONO#: 0.6 10*3/uL (ref 0.1–0.9)
MONO%: 8.7 % (ref 0.0–14.0)
NEUT#: 3.3 10*3/uL (ref 1.5–6.5)
NEUT%: 49.3 % (ref 39.0–75.0)
Platelets: 233 10*3/uL (ref 140–400)
RBC: 3.96 10*6/uL — ABNORMAL LOW (ref 4.20–5.82)
RDW: 12.6 % (ref 11.0–14.6)
WBC: 6.7 10*3/uL (ref 4.0–10.3)
lymph#: 2.5 10*3/uL (ref 0.9–3.3)

## 2013-06-18 LAB — COMPREHENSIVE METABOLIC PANEL (CC13)
ALT: 26 U/L (ref 0–55)
AST: 36 U/L — ABNORMAL HIGH (ref 5–34)
Albumin: 3.6 g/dL (ref 3.5–5.0)
Alkaline Phosphatase: 110 U/L (ref 40–150)
Anion Gap: 11 mEq/L (ref 3–11)
BUN: 15.3 mg/dL (ref 7.0–26.0)
CO2: 30 mEq/L — ABNORMAL HIGH (ref 22–29)
Calcium: 9.9 mg/dL (ref 8.4–10.4)
Chloride: 99 mEq/L (ref 98–109)
Creatinine: 0.9 mg/dL (ref 0.7–1.3)
Glucose: 110 mg/dl (ref 70–140)
Potassium: 4.7 mEq/L (ref 3.5–5.1)
Sodium: 140 mEq/L (ref 136–145)
Total Bilirubin: 0.58 mg/dL (ref 0.20–1.20)
Total Protein: 7.6 g/dL (ref 6.4–8.3)

## 2013-06-18 NOTE — Progress Notes (Signed)
Checked in new pt with no financial concerns. °

## 2013-06-18 NOTE — Progress Notes (Signed)
Please see consult note.  

## 2013-06-18 NOTE — Consult Note (Signed)
Reason for Referral: Monoclonal gammopathy evaluation for a plasma cell disorder.   HPI: 68 year old gentleman native of the state [place] but currently lives in this area and have done so for the majority of his life. He is a gentleman with history of chronic back pain and recurrent osteoarthritis and multiple orthopedic surgeries that include both knees and shoulder. He have had the continuous stiffness in his joints including his knees and hips chronically might have extended to include multiple joints and for that reason was evaluated by Dr. Estanislado Pandy for a possible immune arthritis. He certainly has psoriasis of his lower extremities but not clear if he had psoriatic arthritis. His laboratory evaluation showed a hemoglobin of 12.6 otherwise normal CBC. His chemistries including kidney function and liver function test and calcium all perfectly normal. His quantitative immunoglobulins showed an IgM that is elevated at 1510 upper limit of normal was 251. He had a slightly depressed IgG at 499. His serum protein electrophoresis showed an M spike of 0.81 g/dL. The immunofixation showed a monoclonal IgM kappa to be present. For that reason patient referred to me for an evaluation. Clinically, he reports diffuse arthritic back pain as well as knee pain there have been chronic in nature. He has not reported any recurrent pathological fractures. He has not reported any recurrent sinopulmonary infections. He has not reported any cellulitis or dermatitis. He does have psoriasis. He continues to drive and perform most activities of daily living without any hindrance or decline. He has not reported any constitutional symptoms of fevers chills or sweats. He has not reported any lymphadenopathy or weight loss. He has not reported any petechiae or malaise.   Past Medical History  Diagnosis Date  . ALLERGIC RHINITIS   . GOUT   . LOW BACK PAIN   . OSTEOARTHRITIS   . PULMONARY NODULE, LEFT LOWER LOBE   . TESTICULAR  HYPOFUNCTION   :  Past Surgical History  Procedure Laterality Date  . Colostomy    . Knee surgery      3 on each knee  . Ankle surgery      4 times  . Elbow surgery      torn tendon  . Tonsillectomy and adenoidectomy    . Inguinal hernia repair      right  :  Current Outpatient Prescriptions  Medication Sig Dispense Refill  . allopurinol (ZYLOPRIM) 100 MG tablet Take 100 mg by mouth daily. 5 once daily       . aspirin 325 MG tablet Take 325 mg by mouth daily.        . Cetirizine HCl 10 MG CAPS Take 10 mg by mouth daily.      . Cholecalciferol (VITAMIN D-3) 1000 UNITS CAPS Take 1,000 Units by mouth 2 (two) times daily.      . colchicine 0.6 MG tablet Take 0.6 mg by mouth as needed. gout      . hydrochlorothiazide (HYDRODIURIL) 25 MG tablet Take 1 tablet (25 mg total) by mouth daily.  30 tablet  11  . hydrocortisone 1 % lotion Apply 1 application topically 2 (two) times daily.      Marland Kitchen ibuprofen (ADVIL,MOTRIN) 600 MG tablet Take 600 mg by mouth every 8 (eight) hours as needed.        . methocarbamol (ROBAXIN) 750 MG tablet Take 750 mg by mouth as needed.        . Multiple Vitamin (MULTIVITAMIN) tablet Take 1 tablet by mouth daily.      Marland Kitchen  Oxycodone HCl 10 MG TABS Take 5 mg by mouth every 4 (four) hours as needed. Takes 2 tablets to equal 10 mg every 4 hours as needed      . ranitidine (ZANTAC) 150 MG tablet Take 150 mg by mouth 2 (two) times daily.      Marland Kitchen triamcinolone ointment (KENALOG) 0.1 % Apply 1 application topically daily.      . vitamin B-12 (CYANOCOBALAMIN) 1000 MCG tablet Take 1,000 mcg by mouth daily.      . indomethacin (INDOCIN) 50 MG capsule Take 1 capsule (50 mg total) by mouth 3 (three) times daily as needed.  90 capsule  1  . terazosin (HYTRIN) 5 MG capsule Take 5 mg by mouth at bedtime.        No current facility-administered medications for this visit.       No Known Allergies:   family history reviewed with the patient and he has no hematological disorders  or malignancies in his family.   History   Social History  . Marital Status: Married    Spouse Name: N/A    Number of Children: N/A  . Years of Education: N/A   Occupational History  . Not on file.   Social History Main Topics  . Smoking status: Never Smoker   . Smokeless tobacco: Never Used  . Alcohol Use: 5.0 oz/week    10 drink(s) per week  . Drug Use: No  . Sexual Activity: Not on file   Other Topics Concern  . Not on file   Social History Narrative  . No narrative on file  :  Constitutional: negative for anorexia, night sweats and weight loss Eyes: negative for visual disturbance. He is reporting some erythema and irritation of his eye.  Ears, nose, mouth, throat, and face: positive for ear drainage, negative for sore mouth and sore throat Respiratory: negative for cough, dyspnea on exertion, hemoptysis and sputum Cardiovascular: negative for chest pain, fatigue and syncope Gastrointestinal: negative for abdominal pain, diarrhea and vomiting Genitourinary:negative for frequency, hematuria and nocturia Integument/breast: negative for pruritus and rash Hematologic/lymphatic: negative for easy bruising, lymphadenopathy and petechiae Musculoskeletal:positive for arthralgias, back pain and stiff joints Neurological: negative for dizziness, seizures and weakness Behavioral/Psych: negative for anxiety and depression Endocrine: negative for temperature intolerance Allergic/Immunologic: negative for anaphylaxis and urticaria  Exam: Blood pressure 138/76, pulse 85, temperature 97.2 F (36.2 C), temperature source Oral, resp. rate 18, height 5' 9.5" (1.765 m), weight 209 lb 8 oz (95.029 kg). General appearance: alert and cooperative Head: Normocephalic, without obvious abnormality, atraumatic Nose: Nares normal. Septum midline. Mucosa normal. No drainage or sinus tenderness. Throat: lips, mucosa, and tongue normal; teeth and gums normal Neck: no adenopathy, no carotid  bruit, no JVD, supple, symmetrical, trachea midline and thyroid not enlarged, symmetric, no tenderness/mass/nodules Back: symmetric, no curvature. ROM normal. No CVA tenderness. Resp: clear to auscultation bilaterally Chest wall: no tenderness Cardio: regular rate and rhythm, S1, S2 normal, no murmur, click, rub or gallop GI: soft, non-tender; bowel sounds normal; no masses,  no organomegaly Extremities: extremities normal, atraumatic, no cyanosis or edema Pulses: 2+ and symmetric Skin: Skin color, texture, turgor normal. No rashes or lesions Lymph nodes: Cervical, supraclavicular, and axillary nodes normal. Neurologic: Grossly normal   Recent Labs  06/18/13 1338  WBC 6.7  HGB 13.5  HCT 39.7  PLT 233    Recent Labs  06/18/13 1338  NA 140  K 4.7  CO2 30*  GLUCOSE 110  BUN 15.3  CREATININE  0.9  CALCIUM 9.9      Assessment and Plan:   68 year old gentleman with a monoclonal protein in the form of Ig M. kappa with an IgM level of 1510. They differential diagnosis was discussed today with the patient and this includes a plasma cell disorder such as multiple myeloma, monoclonal gammopathy of undetermined significance as well as amyloid. Also this includes a lymphoproliferative disorder such as non-Hodgkin's lymphoma more specifically Waldenstrm's macroglobulinemia. This could also be a reactive finding due to an autoimmune disorder and his psoriasis. To work this up completely I have repeated his serum protein electrophoresis and quantitative immunoglobulins. I have also obtain a serum light chains. I will also obtain a skeletal survey to rule out any lytic bony lesions. IgM multiple myeloma is rare and most of the time this could be in a pleasant a sign of a lymphoproliferative disorder. More commonly, and reactive finding that you see with autoimmune disorder is a likely diagnosis. Once he has completed the studies as mentioned he'll return to me to discuss these results. I  personally reviewed his imaging studies that you've done for the last 2-3 years including CT scan of the abdomen and pelvis as well as MRI of the spine and shoulder now them indicated any lymphoproliferative disorder or splenomegaly.  All his questions are answered today.

## 2013-06-18 NOTE — Telephone Encounter (Signed)
Gave pt appt for Md and sent pt to radiology today for scan

## 2013-06-19 ENCOUNTER — Telehealth: Payer: Self-pay | Admitting: Medical Oncology

## 2013-06-19 NOTE — Telephone Encounter (Signed)
Message copied by Maxwell Marion on Thu Jun 19, 2013  9:28 AM ------      Message from: Wyatt Portela      Created: Thu Jun 19, 2013  8:34 AM       Please call him with the result of his x rays. All normal and we are waiting for the rest of the blood results which will take few weeks. ------

## 2013-06-19 NOTE — Telephone Encounter (Signed)
Per MD, informed patient of CT results and waiting on rest of blood results for now. Patient expressed thanks, denies questions.

## 2013-06-20 LAB — SPEP & IFE WITH QIG
Albumin ELP: 53.9 % — ABNORMAL LOW (ref 55.8–66.1)
Alpha-1-Globulin: 6.7 % — ABNORMAL HIGH (ref 2.9–4.9)
Alpha-2-Globulin: 11 % (ref 7.1–11.8)
Beta 2: 4.2 % (ref 3.2–6.5)
Beta Globulin: 5.5 % (ref 4.7–7.2)
Gamma Globulin: 18.7 % (ref 11.1–18.8)
IgA: 127 mg/dL (ref 68–379)
IgG (Immunoglobin G), Serum: 453 mg/dL — ABNORMAL LOW (ref 650–1600)
IgM, Serum: 1560 mg/dL — ABNORMAL HIGH (ref 41–251)
M-Spike, %: 0.92 g/dL
Total Protein, Serum Electrophoresis: 7.1 g/dL (ref 6.0–8.3)

## 2013-06-20 LAB — KAPPA/LAMBDA LIGHT CHAINS
Kappa free light chain: 3.75 mg/dL — ABNORMAL HIGH (ref 0.33–1.94)
Kappa:Lambda Ratio: 2.39 — ABNORMAL HIGH (ref 0.26–1.65)
Lambda Free Lght Chn: 1.57 mg/dL (ref 0.57–2.63)

## 2013-07-02 ENCOUNTER — Telehealth: Payer: Self-pay | Admitting: Medical Oncology

## 2013-07-02 ENCOUNTER — Ambulatory Visit (HOSPITAL_BASED_OUTPATIENT_CLINIC_OR_DEPARTMENT_OTHER): Payer: Medicare Other | Admitting: Oncology

## 2013-07-02 ENCOUNTER — Encounter: Payer: Self-pay | Admitting: Oncology

## 2013-07-02 VITALS — BP 153/88 | HR 84 | Temp 98.2°F | Resp 18 | Ht 69.5 in | Wt 211.0 lb

## 2013-07-02 DIAGNOSIS — D472 Monoclonal gammopathy: Secondary | ICD-10-CM

## 2013-07-02 DIAGNOSIS — D729 Disorder of white blood cells, unspecified: Secondary | ICD-10-CM

## 2013-07-02 NOTE — Progress Notes (Signed)
Hematology and Oncology Follow Up Visit  [name] [id number] March 24, 1946 68 y.o. 07/02/2013 3:36 PM   Principle Diagnosis: 68 year old gentleman with a monoclonal protein in the form of Ig M. kappa with an IgM level of 1510. Differential diagnosis inculdes plasma cell disorder lymphoproliferative disorder such as non-Hodgkin's lymphoma or reactive findings.  Current therapy: Observation and surveillance.  Interim History: [name] presents today for a followup visit. He is a pleasant gentleman with the above history presents today to discuss the results of his workup. Clinically he has not had any new symptoms. Has not reported any recent pathological fractures or infections. Has not reported any weight loss or appetite changes. Performance status and activity level remains the same. He still have some diffuse arthralgias and myalgias which have not changed.  Medications: I have reviewed the patient's current medications.  Current Outpatient Prescriptions  Medication Sig Dispense Refill  . allopurinol (ZYLOPRIM) 100 MG tablet Take 100 mg by mouth daily. 5 once daily       . aspirin 325 MG tablet Take 325 mg by mouth daily.        . Cetirizine HCl 10 MG CAPS Take 10 mg by mouth daily.      . Cholecalciferol (VITAMIN D-3) 1000 UNITS CAPS Take 1,000 Units by mouth 2 (two) times daily.      . colchicine 0.6 MG tablet Take 0.6 mg by mouth as needed. gout      . hydrochlorothiazide (HYDRODIURIL) 25 MG tablet Take 1 tablet (25 mg total) by mouth daily.  30 tablet  11  . hydrocortisone 1 % lotion Apply 1 application topically 2 (two) times daily.      Marland Kitchen ibuprofen (ADVIL,MOTRIN) 600 MG tablet Take 600 mg by mouth every 8 (eight) hours as needed.        . indomethacin (INDOCIN) 50 MG capsule Take 1 capsule (50 mg total) by mouth 3 (three) times daily as needed.  90 capsule  1  . methocarbamol (ROBAXIN) 750 MG tablet Take 750 mg by mouth as needed.        . Multiple Vitamin (MULTIVITAMIN) tablet  Take 1 tablet by mouth daily.      . Oxycodone HCl 10 MG TABS Take 5 mg by mouth every 4 (four) hours as needed. Takes 2 tablets to equal 10 mg every 4 hours as needed      . ranitidine (ZANTAC) 150 MG tablet Take 150 mg by mouth 2 (two) times daily.      Marland Kitchen terazosin (HYTRIN) 5 MG capsule Take 5 mg by mouth at bedtime.       . triamcinolone ointment (KENALOG) 0.1 % Apply 1 application topically daily.      . vitamin B-12 (CYANOCOBALAMIN) 1000 MCG tablet Take 1,000 mcg by mouth daily.       No current facility-administered medications for this visit.     Allergies: No Known Allergies  Past Medical History, Surgical history, Social history, and Family History were reviewed and updated.  Review of Systems: Constitutional:  Negative for fever, chills, night sweats, anorexia, weight loss, pain. Cardiovascular: no chest pain or dyspnea on exertion Respiratory: no cough, shortness of breath, or wheezing Neurological: no TIA or stroke symptoms Dermatological: negative ENT: negative Skin: Negative. Gastrointestinal: no abdominal pain, change in bowel habits, or black or bloody stools Genito-Urinary: no dysuria, trouble voiding, or hematuria Hematological and Lymphatic: negative Breast: negative Musculoskeletal: negative Remaining ROS negative. Physical Exam: Blood pressure 153/88, pulse 84, temperature 98.2 F (36.8  C), resp. rate 18, height 5' 9.5" (1.765 m), weight 211 lb (95.709 kg). ECOG: 1 General appearance: alert and cooperative Head: Normocephalic, without obvious abnormality, atraumatic Neck: no adenopathy, no carotid bruit, no JVD, supple, symmetrical, trachea midline and thyroid not enlarged, symmetric, no tenderness/mass/nodules Lymph nodes: Cervical, supraclavicular, and axillary nodes normal. Heart:regular rate and rhythm, S1, S2 normal, no murmur, click, rub or gallop Lung:chest clear, no wheezing, rales, normal symmetric air entry Abdomin: soft, non-tender, without  masses or organomegaly EXT:no erythema, induration, or nodules   Lab Results: Lab Results  Component Value Date   WBC 6.7 06/18/2013   HGB 13.5 06/18/2013   HCT 39.7 06/18/2013   MCV 100.2* 06/18/2013   PLT 233 06/18/2013     Chemistry      Component Value Date/Time   NA 140 06/18/2013 1338   NA 139 01/22/2013 0849   K 4.7 06/18/2013 1338   K 4.1 01/22/2013 0849   CL 99 01/22/2013 0849   CO2 30* 06/18/2013 1338   CO2 31 01/22/2013 0849   BUN 15.3 06/18/2013 1338   BUN 16 01/22/2013 0849   CREATININE 0.9 06/18/2013 1338   CREATININE 0.8 01/22/2013 0849   CREATININE 1.04 11/23/2010 1137      Component Value Date/Time   CALCIUM 9.9 06/18/2013 1338   CALCIUM 9.2 01/22/2013 0849   ALKPHOS 110 06/18/2013 1338   ALKPHOS 98 01/22/2013 0849   AST 36* 06/18/2013 1338   AST 31 01/22/2013 0849   ALT 26 06/18/2013 1338   ALT 20 01/22/2013 0849   BILITOT 0.58 06/18/2013 1338   BILITOT 0.8 01/22/2013 0849       Radiological Studies: EXAM:  METASTATIC BONE SURVEY  COMPARISON: CT scan 11/28/2010  FINDINGS:  No myelomatous skull lesions are identified. There are moderate  degenerative changes involving the spine but no definite lytic  myelomatous lesions.  No definite lytic myelomatous lesions are identified in the  appendicular skeleton.  IMPRESSION:  Negative metastatic bone survey for myelomatous bone lesions.    Impression and Plan:  68 year old gentleman with a IgM monoclonal protein with an M spike of a less than 1 g/dL. His IgM level is 1510. His workup does not reveal any plasma cell disorder or a lymphoproliferative disorder at this time. The differential diagnosis to include reactive findings due to autoimmune disorders. He had imaging studies including CT scan and MRIs and did not show any evidence of lymphadenopathy or splenomegaly. I see no clear-cut end organ damage to suggest multiple myeloma. An indolent lymphoproliferative disorder such as Waldenstrm's macroglobulinemia is  also on the differential. Given the fact that he is completely asymptomatic at this point, we'll continue to observe him and repeat protein studies in November of this year and will have another conversation at that time. If his IgM is rising rapidly, we will restage him with CT scan and a bone marrow biopsy.    Zola Button, MD 4/1/20153:36 PM

## 2013-07-02 NOTE — Telephone Encounter (Signed)
Per MD, Call to pt to inquire if he can come in today for appt instead of Thrusday as scheduled. Patient confirmed can come today for 3:00 appt. Pt sched today for 3 pm.

## 2013-07-03 ENCOUNTER — Ambulatory Visit: Payer: Medicare Other | Admitting: Oncology

## 2013-07-03 ENCOUNTER — Telehealth: Payer: Self-pay | Admitting: *Deleted

## 2013-07-03 NOTE — Telephone Encounter (Signed)
Per 4/1 POF I have scheudled appts. Patient called and aware of appts. Mailed calendar.  JMW

## 2013-07-09 ENCOUNTER — Telehealth: Payer: Self-pay | Admitting: Oncology

## 2013-07-09 NOTE — Telephone Encounter (Signed)
pt called to r/s appt..done...pt aware of new d.t °

## 2013-09-08 ENCOUNTER — Encounter: Payer: Self-pay | Admitting: Internal Medicine

## 2013-09-08 ENCOUNTER — Ambulatory Visit (INDEPENDENT_AMBULATORY_CARE_PROVIDER_SITE_OTHER): Payer: Medicare Other | Admitting: Internal Medicine

## 2013-09-08 VITALS — BP 128/70 | HR 83 | Temp 97.8°F | Resp 20 | Ht 69.5 in | Wt 219.0 lb

## 2013-09-08 DIAGNOSIS — M109 Gout, unspecified: Secondary | ICD-10-CM

## 2013-09-08 DIAGNOSIS — M1990 Unspecified osteoarthritis, unspecified site: Secondary | ICD-10-CM

## 2013-09-08 NOTE — Progress Notes (Signed)
Pre-visit discussion using our clinic review tool. No additional management support is needed unless otherwise documented below in the visit note.  

## 2013-09-08 NOTE — Progress Notes (Signed)
Subjective:    Patient ID: [name], male    DOB: Dec 22, 1945, 68 y.o.   MRN: [id number]  HPI  68 year old patient who is followed by rheumatology due to osteoarthritis.  Medical problems include a history of testicular hypofunction, hypertension.  He is also followed at the [facility] system.  He has been evaluated by hematology recently due to an M spike that was felt to be reactive.  The patient is doing well today without constitutional complaints. Medical records reviewed  Past Medical History  Diagnosis Date  . ALLERGIC RHINITIS   . GOUT   . LOW BACK PAIN   . OSTEOARTHRITIS   . PULMONARY NODULE, LEFT LOWER LOBE   . TESTICULAR HYPOFUNCTION     History   Social History  . Marital Status: Married    Spouse Name: N/A    Number of Children: N/A  . Years of Education: N/A   Occupational History  . Not on file.   Social History Main Topics  . Smoking status: Never Smoker   . Smokeless tobacco: Never Used  . Alcohol Use: 5.0 oz/week    10 drink(s) per week  . Drug Use: No  . Sexual Activity: Not on file   Other Topics Concern  . Not on file   Social History Narrative  . No narrative on file    Past Surgical History  Procedure Laterality Date  . Colostomy    . Knee surgery      3 on each knee  . Ankle surgery      4 times  . Elbow surgery      torn tendon  . Tonsillectomy and adenoidectomy    . Inguinal hernia repair      right    History reviewed. No pertinent family history.  No Known Allergies  Current Outpatient Prescriptions on File Prior to Visit  Medication Sig Dispense Refill  . allopurinol (ZYLOPRIM) 100 MG tablet Take 500 mg by mouth daily. 5 once daily       . aspirin 325 MG tablet Take 325 mg by mouth daily.        . Cetirizine HCl 10 MG CAPS Take 10 mg by mouth daily.      . Cholecalciferol (VITAMIN D-3) 1000 UNITS CAPS Take 1,000 Units by mouth 2 (two) times daily.      . colchicine 0.6 MG tablet Take 0.6 mg by mouth as needed.  gout      . hydrochlorothiazide (HYDRODIURIL) 25 MG tablet Take 1 tablet (25 mg total) by mouth daily.  30 tablet  11  . ibuprofen (ADVIL,MOTRIN) 600 MG tablet Take 600 mg by mouth every 8 (eight) hours as needed.        . indomethacin (INDOCIN) 50 MG capsule Take 1 capsule (50 mg total) by mouth 3 (three) times daily as needed.  90 capsule  1  . methocarbamol (ROBAXIN) 750 MG tablet Take 750 mg by mouth as needed.        . Multiple Vitamin (MULTIVITAMIN) tablet Take 1 tablet by mouth daily.      . Oxycodone HCl 10 MG TABS Take 5 mg by mouth every 4 (four) hours as needed. Takes 2 tablets to equal 10 mg every 4 hours as needed      . ranitidine (ZANTAC) 150 MG tablet Take 150 mg by mouth 2 (two) times daily.      Marland Kitchen terazosin (HYTRIN) 5 MG capsule Take 5 mg by mouth at bedtime.       Marland Kitchen  vitamin B-12 (CYANOCOBALAMIN) 1000 MCG tablet Take 1,000 mcg by mouth daily.       No current facility-administered medications on file prior to visit.    BP 128/70  Pulse 83  Temp(Src) 97.8 F (36.6 C) (Oral)  Resp 20  Ht 5' 9.5" (1.765 m)  Wt 219 lb (99.338 kg)  BMI 31.89 kg/m2  SpO2 98%       Review of Systems  Constitutional: Negative for fever, chills, appetite change and fatigue.  HENT: Negative for congestion, dental problem, ear pain, hearing loss, sore throat, tinnitus, trouble swallowing and voice change.   Eyes: Negative for pain, discharge and visual disturbance.  Respiratory: Negative for cough, chest tightness, wheezing and stridor.   Cardiovascular: Negative for chest pain, palpitations and leg swelling.  Gastrointestinal: Negative for nausea, vomiting, abdominal pain, diarrhea, constipation, blood in stool and abdominal distention.  Genitourinary: Negative for urgency, hematuria, flank pain, discharge, difficulty urinating and genital sores.  Musculoskeletal: Positive for arthralgias and back pain. Negative for gait problem, joint swelling, myalgias and neck stiffness.  Skin:  Negative for rash.  Neurological: Negative for dizziness, syncope, speech difficulty, weakness, numbness and headaches.  Hematological: Negative for adenopathy. Does not bruise/bleed easily.  Psychiatric/Behavioral: Negative for behavioral problems and dysphoric mood. The patient is not nervous/anxious.        Objective:   Physical Exam  Constitutional: He is oriented to person, place, and time. He appears well-developed.  HENT:  Head: Normocephalic.  Right Ear: External ear normal.  Left Ear: External ear normal.  Eyes: Conjunctivae and EOM are normal.  Neck: Normal range of motion.  Cardiovascular: Normal rate and normal heart sounds.   Pulmonary/Chest: Breath sounds normal.  Abdominal: Bowel sounds are normal.  Musculoskeletal: Normal range of motion. He exhibits no edema and no tenderness.  Neurological: He is alert and oriented to person, place, and time.  Psychiatric: He has a normal mood and affect. His behavior is normal.          Assessment & Plan:   Osteoarthritis History of M spike.  Followup hematology. Hyperuricemia History of gout  Medications updated Schedule CPX Followup rheumatology hematology and [facility] system

## 2013-09-08 NOTE — Patient Instructions (Signed)
Limit your sodium (Salt) intake    It is important that you exercise regularly, at least 20 minutes 3 to 4 times per week.  If you develop chest pain or shortness of breath seek  medical attention.  Please check your blood pressure on a regular basis.  If it is consistently greater than 150/90, please make an office appointment.  Return in 6 months for follow-up   

## 2013-10-20 ENCOUNTER — Telehealth: Payer: Self-pay | Admitting: Oncology

## 2013-10-20 NOTE — Telephone Encounter (Signed)
Pt called to r/s labs/ov, r/s and mailed updated schedule to pt.Marland Kitchen..KJ

## 2013-12-12 ENCOUNTER — Other Ambulatory Visit: Payer: Medicare Other

## 2013-12-19 ENCOUNTER — Ambulatory Visit: Payer: Medicare Other | Admitting: Oncology

## 2014-01-16 ENCOUNTER — Other Ambulatory Visit: Payer: Self-pay | Admitting: Internal Medicine

## 2014-01-16 NOTE — Telephone Encounter (Signed)
Pt request refill of the following: allopurinol (ZYLOPRIM) 100 MG tablet    Phamacy:  [facility]

## 2014-01-19 MED ORDER — ALLOPURINOL 100 MG PO TABS: 500.0000 mg | ORAL_TABLET | Freq: Every day | ORAL | Status: DC

## 2014-01-19 NOTE — Telephone Encounter (Signed)
Rx sent 

## 2014-02-09 ENCOUNTER — Other Ambulatory Visit: Payer: Medicare Other

## 2014-02-17 ENCOUNTER — Ambulatory Visit: Payer: Medicare Other | Admitting: Oncology

## 2014-03-03 ENCOUNTER — Other Ambulatory Visit (HOSPITAL_BASED_OUTPATIENT_CLINIC_OR_DEPARTMENT_OTHER): Payer: Medicare Other

## 2014-03-03 DIAGNOSIS — D472 Monoclonal gammopathy: Secondary | ICD-10-CM

## 2014-03-03 DIAGNOSIS — D729 Disorder of white blood cells, unspecified: Secondary | ICD-10-CM

## 2014-03-03 LAB — CBC WITH DIFFERENTIAL/PLATELET
BASO%: 0.4 % (ref 0.0–2.0)
Basophils Absolute: 0 10*3/uL (ref 0.0–0.1)
EOS%: 0.8 % (ref 0.0–7.0)
Eosinophils Absolute: 0 10*3/uL (ref 0.0–0.5)
HCT: 38.6 % (ref 38.4–49.9)
HGB: 13.2 g/dL (ref 13.0–17.1)
LYMPH%: 14.4 % (ref 14.0–49.0)
MCH: 33.7 pg — ABNORMAL HIGH (ref 27.2–33.4)
MCHC: 34.2 g/dL (ref 32.0–36.0)
MCV: 98.5 fL — ABNORMAL HIGH (ref 79.3–98.0)
MONO#: 0.1 10*3/uL (ref 0.1–0.9)
MONO%: 1.9 % (ref 0.0–14.0)
NEUT#: 4.2 10*3/uL (ref 1.5–6.5)
NEUT%: 82.5 % — ABNORMAL HIGH (ref 39.0–75.0)
Platelets: 171 10*3/uL (ref 140–400)
RBC: 3.92 10*6/uL — ABNORMAL LOW (ref 4.20–5.82)
RDW: 12.5 % (ref 11.0–14.6)
WBC: 5.1 10*3/uL (ref 4.0–10.3)
lymph#: 0.7 10*3/uL — ABNORMAL LOW (ref 0.9–3.3)

## 2014-03-03 LAB — COMPREHENSIVE METABOLIC PANEL (CC13)
ALT: 25 U/L (ref 0–55)
AST: 34 U/L (ref 5–34)
Albumin: 3.7 g/dL (ref 3.5–5.0)
Alkaline Phosphatase: 111 U/L (ref 40–150)
Anion Gap: 10 mEq/L (ref 3–11)
BUN: 11.2 mg/dL (ref 7.0–26.0)
CO2: 28 mEq/L (ref 22–29)
Calcium: 9.5 mg/dL (ref 8.4–10.4)
Chloride: 102 mEq/L (ref 98–109)
Creatinine: 0.9 mg/dL (ref 0.7–1.3)
Glucose: 147 mg/dl — ABNORMAL HIGH (ref 70–140)
Potassium: 4.6 mEq/L (ref 3.5–5.1)
Sodium: 141 mEq/L (ref 136–145)
Total Bilirubin: 1.25 mg/dL — ABNORMAL HIGH (ref 0.20–1.20)
Total Protein: 7.3 g/dL (ref 6.4–8.3)

## 2014-03-06 LAB — SPEP & IFE WITH QIG
Albumin ELP: 54.3 % — ABNORMAL LOW (ref 55.8–66.1)
Alpha-1-Globulin: 5.3 % — ABNORMAL HIGH (ref 2.9–4.9)
Alpha-2-Globulin: 9.3 % (ref 7.1–11.8)
Beta 2: 4.3 % (ref 3.2–6.5)
Beta Globulin: 6.1 % (ref 4.7–7.2)
Gamma Globulin: 20.7 % — ABNORMAL HIGH (ref 11.1–18.8)
IgA: 109 mg/dL (ref 68–379)
IgG (Immunoglobin G), Serum: 527 mg/dL — ABNORMAL LOW (ref 650–1600)
IgM, Serum: 1390 mg/dL — ABNORMAL HIGH (ref 41–251)
M-Spike, %: 0.23 g/dL
Total Protein, Serum Electrophoresis: 7.1 g/dL (ref 6.0–8.3)

## 2014-03-10 ENCOUNTER — Ambulatory Visit (HOSPITAL_BASED_OUTPATIENT_CLINIC_OR_DEPARTMENT_OTHER): Payer: Medicare Other | Admitting: Oncology

## 2014-03-10 ENCOUNTER — Telehealth: Payer: Self-pay | Admitting: Oncology

## 2014-03-10 VITALS — BP 152/62 | HR 104 | Temp 97.5°F | Resp 20 | Ht 69.5 in | Wt 209.2 lb

## 2014-03-10 DIAGNOSIS — R21 Rash and other nonspecific skin eruption: Secondary | ICD-10-CM

## 2014-03-10 DIAGNOSIS — D472 Monoclonal gammopathy: Secondary | ICD-10-CM

## 2014-03-10 NOTE — Telephone Encounter (Signed)
gv adn printed appt sched and avs for pt for SEpt 2016

## 2014-03-10 NOTE — Progress Notes (Signed)
Hematology and Oncology Follow Up Visit  [name] [id number] Sep 29, 1945 68 y.o. 03/10/2014 3:24 PM   Principle Diagnosis: 68 year old gentleman with a monoclonal protein in the form of IgM. kappa with an IgM level of 1510. Differential diagnosis inculdes plasma cell disorder lymphoproliferative disorder such as non-Hodgkin's lymphoma or reactive findings.  Current therapy: Observation and surveillance.  Interim History: [name] presents today for a followup visit. Since the last visit, he has not had any new symptoms. Has not reported any recent pathological fractures or infections. Has not reported any weight loss or appetite changes. Performance status and activity level remains the same. He still have some diffuse arthralgias and myalgias which have not changed. He did report diffuse rash that required prednisone treatment. He does not report any headaches or blurry vision or syncope. Does not report any chest pain or shortness of breath. Does not report any cough or hemoptysis. Rest of his review of systems unremarkable.  Medications: I have reviewed the patient's current medications.  Current Outpatient Prescriptions  Medication Sig Dispense Refill  . allopurinol (ZYLOPRIM) 100 MG tablet Take 5 tablets (500 mg total) by mouth daily. 150 tablet 2  . aspirin 325 MG tablet Take 325 mg by mouth daily.      . Cholecalciferol (VITAMIN D-3) 1000 UNITS CAPS Take 1,000 Units by mouth 2 (two) times daily.    . clobetasol ointment (TEMOVATE) 2.70 % Apply 1 application topically daily.     . colchicine 0.6 MG tablet Take 0.6 mg by mouth as needed. gout    . hydrochlorothiazide (HYDRODIURIL) 25 MG tablet Take 1 tablet (25 mg total) by mouth daily. 30 tablet 11  . hydrOXYzine (ATARAX/VISTARIL) 25 MG tablet Take 25 mg by mouth 3 (three) times daily.  0  . ibuprofen (ADVIL,MOTRIN) 600 MG tablet Take 600 mg by mouth every 8 (eight) hours as needed.      . indomethacin (INDOCIN) 50 MG capsule Take  1 capsule (50 mg total) by mouth 3 (three) times daily as needed. 90 capsule 1  . methocarbamol (ROBAXIN) 750 MG tablet Take 750 mg by mouth as needed.      . Multiple Vitamin (MULTIVITAMIN) tablet Take 1 tablet by mouth daily.    . Oxycodone HCl 10 MG TABS Take 5 mg by mouth every 4 (four) hours as needed. Takes 2 tablets to equal 10 mg every 4 hours as needed    . predniSONE (DELTASONE) 5 MG tablet   0  . ranitidine (ZANTAC) 150 MG tablet Take 150 mg by mouth 2 (two) times daily.    Marland Kitchen terazosin (HYTRIN) 5 MG capsule Take 5 mg by mouth at bedtime.     . vitamin B-12 (CYANOCOBALAMIN) 1000 MCG tablet Take 1,000 mcg by mouth daily.     No current facility-administered medications for this visit.     Allergies: No Known Allergies  Past Medical History, Surgical history, Social history, and Family History were reviewed and updated.   Physical Exam: Blood pressure 152/62, pulse 104, temperature 97.5 F (36.4 C), temperature source Oral, resp. rate 20, height 5' 9.5" (1.765 m), weight 209 lb 3.2 oz (94.892 kg). ECOG: 1 General appearance: alert and cooperative Head: Normocephalic, without obvious abnormality, atraumatic Neck: no adenopathy Lymph nodes: Cervical, supraclavicular, and axillary nodes normal. Heart:regular rate and rhythm, S1, S2 normal, no murmur, click, rub or gallop Lung:chest clear, no wheezing, rales, normal symmetric air entry Abdomin: soft, non-tender, without masses or organomegaly EXT:no erythema, induration, or nodules  Lab Results: Lab Results  Component Value Date   WBC 5.1 03/03/2014   HGB 13.2 03/03/2014   HCT 38.6 03/03/2014   MCV 98.5* 03/03/2014   PLT 171 03/03/2014     Chemistry      Component Value Date/Time   NA 141 03/03/2014 1133   NA 139 01/22/2013 0849   K 4.6 03/03/2014 1133   K 4.1 01/22/2013 0849   CL 99 01/22/2013 0849   CO2 28 03/03/2014 1133   CO2 31 01/22/2013 0849   BUN 11.2 03/03/2014 1133   BUN 16 01/22/2013 0849    CREATININE 0.9 03/03/2014 1133   CREATININE 0.8 01/22/2013 0849   CREATININE 1.04 11/23/2010 1137      Component Value Date/Time   CALCIUM 9.5 03/03/2014 1133   CALCIUM 9.2 01/22/2013 0849   ALKPHOS 111 03/03/2014 1133   ALKPHOS 98 01/22/2013 0849   AST 34 03/03/2014 1133   AST 31 01/22/2013 0849   ALT 25 03/03/2014 1133   ALT 20 01/22/2013 0849   BILITOT 1.25* 03/03/2014 1133   BILITOT 0.8 01/22/2013 0849     Results for [name] (MRN [id number]) as of 03/10/2014 15:26  Ref. Range 03/03/2014 11:33  M-SPIKE, % No range found 0.23  SPE Interp. No range found *  IgG (Immunoglobin G), Serum Latest Range: 585-721-5720 mg/dL 527 (L)  IgA Latest Range: 68-379 mg/dL 109  IgM, Serum Latest Range: 41-251 mg/dL 1390 (H)  Total Protein, Serum Electrophoresis Latest Range: 6.0-8.3 g/dL 7.1     Impression and Plan:  68 year old gentleman with:  1. IgM monoclonal protein with an M spike of a less than 1 g/dL. His IgM level is 1510. His workup does not reveal any plasma cell disorder or a lymphoproliferative disorder at this time. The differential diagnosis to include reactive findings due to autoimmune disorders. His repeat protein studies from 03/03/2014 was discussed. His M spike and total IgM have not changed. I doubt he has a lymphoproliferative disorder or a plasma cell disorder at this time but certainly she is at risk of possibly developing 1. The plan is to continue with active surveillance and repeat the studies in 9 months.  2. Diffuse rash: Seems to be improving on prednisone.  Zola Button, MD 12/8/20153:24 PM

## 2014-04-09 ENCOUNTER — Ambulatory Visit (INDEPENDENT_AMBULATORY_CARE_PROVIDER_SITE_OTHER): Payer: Medicare Other | Admitting: Internal Medicine

## 2014-04-09 ENCOUNTER — Encounter: Payer: Self-pay | Admitting: Internal Medicine

## 2014-04-09 VITALS — BP 148/80 | HR 80 | Temp 97.6°F | Resp 20 | Ht 72.25 in | Wt 225.0 lb

## 2014-04-09 DIAGNOSIS — M150 Primary generalized (osteo)arthritis: Secondary | ICD-10-CM

## 2014-04-09 DIAGNOSIS — M159 Polyosteoarthritis, unspecified: Secondary | ICD-10-CM

## 2014-04-09 DIAGNOSIS — Z860100 Personal history of colon polyps, unspecified: Secondary | ICD-10-CM

## 2014-04-09 DIAGNOSIS — R600 Localized edema: Secondary | ICD-10-CM

## 2014-04-09 DIAGNOSIS — Z0000 Encounter for general adult medical examination without abnormal findings: Secondary | ICD-10-CM

## 2014-04-09 DIAGNOSIS — J3089 Other allergic rhinitis: Secondary | ICD-10-CM

## 2014-04-09 MED ORDER — FUROSEMIDE 40 MG PO TABS: 40.0000 mg | ORAL_TABLET | Freq: Every day | ORAL | Status: DC

## 2014-04-09 NOTE — Progress Notes (Signed)
Subjective:    Patient ID: [name], male    DOB: [birth year], 69 y.o.   MRN: [id number]  HPI  CC: cpx - doing well.   History of Present Illness:   69  year-old patient who is seen today for an annual exam.  He has been followed by oncology with MGUS; he is also followed by rheumatology and orthopedics, urology and the [facility] hospital system. Medical problems include B12 deficiency, not controlled on oral supplements.  He has a history of gout, which has been stable.  He was stable until 3 days ago when he developed some mild stomach upset with diarrhea over the past 3 days she has had significant lower extremity edema distal to the knees. He was treated with prednisone briefly in December, but not for a number of weeks Allergies (verified):   No Known Drug Allergies   Past History:  Past Medical History:  Low back pain  Osteoarthritis  bilateral knee pain  Gout  Allergic rhinitis   6-mm left lower lobe pulmonary nodule  B12 deficiency MGUS  Past Surgical History:  multiple knee surgeries bilaterally from the 1970s through the 1990s; 3 on the left and 3 on the right  colonoscopy ([facility]) estimated 8 to 10 years ago,  6/12 status post right inguinal hernia repair July 2010   Family History:   father died age 10;  mother, age 49- died lung ca age 70  One sister in good health   Social History:   Retired  Married  Never Smoked  two adult children   1. Risk factors, based on past  M,S,F history cardiovascular risk factors- none  2.  Physical activities: No major limitations although somewhat limited due to bilateral knee and back pain  3.  Depression/mood: No history of depression or mood disorder although he does have chronic pain 4.  Hearing: No deficits  5.  ADL's: Independent in all aspects of daily living  6.  Fall risk: low 7.  Home safety: No problems identified 8.  Height weight, and visual acuity; height and weight stable no change in visual  acuity  9.  Counseling: Low salt diet regular exercise are encouraged  10. Lab orders based on risk factors: Laboratory profile will be reviewed. He has had a recent PSA  11. Referral : Followup urology and orthopedics, oncology  12. Care plan: The patient wishes to have a pneumococcal vaccine at the [facility]. 13. Cognitive assessment: Alert and oriented with normal affect 14.  Preventive services will include annual primary care evaluations.  He is followed closely by oncology, rheumatology and orthopedics.  Patient was provided with a written and personalized care plan 15.  Provider list includes rheumatology oncology orthopedic surgery urology       .    Review of Systems  Constitutional: Negative for fever, chills, activity change, appetite change and fatigue.  HENT: Negative for congestion, dental problem, ear pain, hearing loss, mouth sores, rhinorrhea, sinus pressure, sneezing, tinnitus, trouble swallowing and voice change.   Eyes: Negative for photophobia, pain, redness and visual disturbance.  Respiratory: Negative for apnea, cough, choking, chest tightness, shortness of breath and wheezing.   Cardiovascular: Positive for leg swelling. Negative for chest pain and palpitations.  Gastrointestinal: Positive for diarrhea. Negative for nausea, vomiting, constipation, blood in stool, abdominal distention, anal bleeding and rectal pain.  Genitourinary: Negative for dysuria, urgency, frequency, hematuria, flank pain, decreased urine volume, discharge, penile swelling, scrotal swelling, difficulty urinating, genital sores and testicular pain.  Musculoskeletal: Positive for back pain, joint swelling and gait problem. Negative for myalgias, arthralgias, neck pain and neck stiffness.  Skin: Negative for color change, rash and wound.  Neurological: Negative for dizziness, tremors, seizures, syncope, facial asymmetry, speech difficulty, weakness, light-headedness, numbness and  headaches.  Hematological: Negative for adenopathy. Does not bruise/bleed easily.  Psychiatric/Behavioral: Negative for suicidal ideas, hallucinations, behavioral problems, confusion, sleep disturbance, self-injury, dysphoric mood, decreased concentration and agitation. The patient is not nervous/anxious.        Objective:   Physical Exam  Constitutional: He appears well-developed and well-nourished.  HENT:  Head: Normocephalic and atraumatic.  Right Ear: External ear normal.  Left Ear: External ear normal.  Nose: Nose normal.  Mouth/Throat: Oropharynx is clear and moist.  Eyes: Conjunctivae and EOM are normal. Pupils are equal, round, and reactive to light. No scleral icterus.  Neck: Normal range of motion. Neck supple. No JVD present. No thyromegaly present.  Cardiovascular: Regular rhythm, normal heart sounds and intact distal pulses.  Exam reveals no gallop and no friction rub.   No murmur heard. Pulmonary/Chest: Effort normal and breath sounds normal. He exhibits no tenderness.  Abdominal: Soft. Bowel sounds are normal. He exhibits no distension and no mass. There is no tenderness.  Genitourinary: Penis normal.  Musculoskeletal: Normal range of motion. He exhibits edema. He exhibits no tenderness.  Prominent pitting Edema distal to knees  Lymphadenopathy:    He has no cervical adenopathy.  Neurological: He is alert. He has normal reflexes. No cranial nerve deficit. Coordination normal.  Absent vibratory sensation distally  Skin: Skin is warm and dry. No rash noted.  Psychiatric: He has a normal mood and affect. His behavior is normal.          Assessment & Plan:    Preventive health examination  Osteoarthritis Chronic low back pain History of gout Allergic rhinitis B12 deficiency  MGUS  Lower extremity edema.  Recent laboratory studies were reviewed;  we'll place on a restricted salt diet, diuretic therapy and reassess in 2 weeks.  Depending on clinical response  will consider further studies    History of testosterone insufficiency. He has been treated in the past however his most recent testosterone level off treatment was in excess of 600

## 2014-04-09 NOTE — Progress Notes (Signed)
Pre visit review using our clinic review tool, if applicable. No additional management support is needed unless otherwise documented below in the visit note. 

## 2014-04-09 NOTE — Patient Instructions (Signed)
Limit your sodium (Salt) intake  Return in 2 weeks for follow-up  Low-Sodium Eating Plan Sodium raises blood pressure and causes water to be held in the body. Getting less sodium from food will help lower your blood pressure, reduce any swelling, and protect your heart, liver, and kidneys. We get sodium by adding salt (sodium chloride) to food. Most of our sodium comes from canned, boxed, and frozen foods. Restaurant foods, fast foods, and pizza are also very high in sodium. Even if you take medicine to lower your blood pressure or to reduce fluid in your body, getting less sodium from your food is important. WHAT IS MY PLAN? Most people should limit their sodium intake to 2,300 mg a day. Your health care provider recommends that you limit your sodium intake to __________ a day.  WHAT DO I NEED TO KNOW ABOUT THIS EATING PLAN? For the low-sodium eating plan, you will follow these general guidelines:  Choose foods with a % Daily Value for sodium of less than 5% (as listed on the food label).   Use salt-free seasonings or herbs instead of table salt or sea salt.   Check with your health care provider or pharmacist before using salt substitutes.   Eat fresh foods.  Eat more vegetables and fruits.  Limit canned vegetables. If you do use them, rinse them well to decrease the sodium.   Limit cheese to 1 oz (28 g) per day.   Eat lower-sodium products, often labeled as "lower sodium" or "no salt added."  Avoid foods that contain monosodium glutamate (MSG). MSG is sometimes added to [place] food and some canned foods.  Check food labels (Nutrition Facts labels) on foods to learn how much sodium is in one serving.  Eat more home-cooked food and less restaurant, buffet, and fast food.  When eating at a restaurant, ask that your food be prepared with less salt or none, if possible.  HOW DO I READ FOOD LABELS FOR SODIUM INFORMATION? The Nutrition Facts label lists the amount of sodium  in one serving of the food. If you eat more than one serving, you must multiply the listed amount of sodium by the number of servings. Food labels may also identify foods as:  Sodium free--Less than 5 mg in a serving.  Very low sodium--35 mg or less in a serving.  Low sodium--140 mg or less in a serving.  Light in sodium--50% less sodium in a serving. For example, if a food that usually has 300 mg of sodium is changed to become light in sodium, it will have 150 mg of sodium.  Reduced sodium--25% less sodium in a serving. For example, if a food that usually has 400 mg of sodium is changed to reduced sodium, it will have 300 mg of sodium. WHAT FOODS CAN I EAT? Grains Low-sodium cereals, including oats, puffed wheat and rice, and shredded wheat cereals. Low-sodium crackers. Unsalted rice and pasta. Lower-sodium bread.  Vegetables Frozen or fresh vegetables. Low-sodium or reduced-sodium canned vegetables. Low-sodium or reduced-sodium tomato sauce and paste. Low-sodium or reduced-sodium tomato and vegetable juices.  Fruits Fresh, frozen, and canned fruit. Fruit juice.  Meat and Other Protein Products Low-sodium canned tuna and salmon. Fresh or frozen meat, poultry, seafood, and fish. Lamb. Unsalted nuts. Dried beans, peas, and lentils without added salt. Unsalted canned beans. Homemade soups without salt. Eggs.  Dairy Milk. Soy milk. Ricotta cheese. Low-sodium or reduced-sodium cheeses. Yogurt.  Condiments Fresh and dried herbs and spices. Salt-free seasonings. Onion and garlic  powders. Low-sodium varieties of mustard and ketchup. Lemon juice.  Fats and Oils Reduced-sodium salad dressings. Unsalted butter.  Other Unsalted popcorn and pretzels.  The items listed above may not be a complete list of recommended foods or beverages. Contact your dietitian for more options. WHAT FOODS ARE NOT RECOMMENDED? Grains Instant hot cereals. Bread stuffing, pancake, and biscuit mixes.  Croutons. Seasoned rice or pasta mixes. Noodle soup cups. Boxed or frozen macaroni and cheese. Self-rising flour. Regular salted crackers. Vegetables Regular canned vegetables. Regular canned tomato sauce and paste. Regular tomato and vegetable juices. Frozen vegetables in sauces. Salted french fries. Olives. [name]. Relishes. Sauerkraut. Salsa. Meat and Other Protein Products Salted, canned, smoked, spiced, or pickled meats, seafood, or fish. Bacon, ham, sausage, hot dogs, corned beef, chipped beef, and packaged luncheon meats. Salt pork. Jerky. Pickled herring. Anchovies, regular canned tuna, and sardines. Salted nuts. Dairy Processed cheese and cheese spreads. Cheese curds. Blue cheese and cottage cheese. Buttermilk.  Condiments Onion and garlic salt, seasoned salt, table salt, and sea salt. Canned and packaged gravies. Worcestershire sauce. Tartar sauce. Barbecue sauce. Teriyaki sauce. Soy sauce, including reduced sodium. Steak sauce. Fish sauce. Oyster sauce. Cocktail sauce. Horseradish. Regular ketchup and mustard. Meat flavorings and tenderizers. Bouillon cubes. Hot sauce. Tabasco sauce. Marinades. Taco seasonings. Relishes. Fats and Oils Regular salad dressings. Salted butter. Margarine. Ghee. Bacon fat.  Other Potato and tortilla chips. Corn chips and puffs. Salted popcorn and pretzels. Canned or dried soups. Pizza. Frozen entrees and pot pies.  The items listed above may not be a complete list of foods and beverages to avoid. Contact your dietitian for more information. Document Released: 09/09/2001 Document Revised: 03/25/2013 Document Reviewed: 01/22/2013 [facility] Patient Information 2015 [place], [place]. This information is not intended to replace advice given to you by your health care provider. Make sure you discuss any questions you have with your health care provider. Health Maintenance A healthy lifestyle and preventative care can promote health and wellness.  Maintain  regular health, dental, and eye exams.  Eat a healthy diet. Foods like vegetables, fruits, whole grains, low-fat dairy products, and lean protein foods contain the nutrients you need and are low in calories. Decrease your intake of foods high in solid fats, added sugars, and salt. Get information about a proper diet from your health care provider, if necessary.  Regular physical exercise is one of the most important things you can do for your health. Most adults should get at least 150 minutes of moderate-intensity exercise (any activity that increases your heart rate and causes you to sweat) each week. In addition, most adults need muscle-strengthening exercises on 2 or more days a week.   Maintain a healthy weight. The body mass index (BMI) is a screening tool to identify possible weight problems. It provides an estimate of body fat based on height and weight. Your health care provider can find your BMI and can help you achieve or maintain a healthy weight. For males 20 years and older:  A BMI below 18.5 is considered underweight.  A BMI of 18.5 to 24.9 is normal.  A BMI of 25 to 29.9 is considered overweight.  A BMI of 30 and above is considered obese.  Maintain normal blood lipids and cholesterol by exercising and minimizing your intake of saturated fat. Eat a balanced diet with plenty of fruits and vegetables. Blood tests for lipids and cholesterol should begin at age 55 and be repeated every 5 years. If your lipid or cholesterol levels are high,  you are over age 57, or you are at high risk for heart disease, you may need your cholesterol levels checked more frequently.Ongoing high lipid and cholesterol levels should be treated with medicines if diet and exercise are not working.  If you smoke, find out from your health care provider how to quit. If you do not use tobacco, do not start.  Lung cancer screening is recommended for adults aged 45-80 years who are at high risk for developing  lung cancer because of a history of smoking. A yearly low-dose CT scan of the lungs is recommended for people who have at least a 30-pack-year history of smoking and are current smokers or have quit within the past 15 years. A pack year of smoking is smoking an average of 1 pack of cigarettes a day for 1 year (for example, a 30-pack-year history of smoking could mean smoking 1 pack a day for 30 years or 2 packs a day for 15 years). Yearly screening should continue until the smoker has stopped smoking for at least 15 years. Yearly screening should be stopped for people who develop a health problem that would prevent them from having lung cancer treatment.  If you choose to drink alcohol, do not have more than 2 drinks per day. One drink is considered to be 12 oz (360 mL) of beer, 5 oz (150 mL) of wine, or 1.5 oz (45 mL) of liquor.  Avoid the use of street drugs. Do not share needles with anyone. Ask for help if you need support or instructions about stopping the use of drugs.  High blood pressure causes heart disease and increases the risk of stroke. Blood pressure should be checked at least every 1-2 years. Ongoing high blood pressure should be treated with medicines if weight loss and exercise are not effective.  If you are 41-19 years old, ask your health care provider if you should take aspirin to prevent heart disease.  Diabetes screening involves taking a blood sample to check your fasting blood sugar level. This should be done once every 3 years after age 16 if you are at a normal weight and without risk factors for diabetes. Testing should be considered at a younger age or be carried out more frequently if you are overweight and have at least 1 risk factor for diabetes.  Colorectal cancer can be detected and often prevented. Most routine colorectal cancer screening begins at the age of 39 and continues through age 63. However, your health care provider may recommend screening at an earlier age if  you have risk factors for colon cancer. On a yearly basis, your health care provider may provide home test kits to check for hidden blood in the stool. A small camera at the end of a tube may be used to directly examine the colon (sigmoidoscopy or colonoscopy) to detect the earliest forms of colorectal cancer. Talk to your health care provider about this at age 68 when routine screening begins. A direct exam of the colon should be repeated every 5-10 years through age 31, unless early forms of precancerous polyps or small growths are found.  People who are at an increased risk for hepatitis B should be screened for this virus. You are considered at high risk for hepatitis B if:  You were born in a country where hepatitis B occurs often. Talk with your health care provider about which countries are considered high risk.  Your parents were born in a high-risk country and you have not  received a shot to protect against hepatitis B (hepatitis B vaccine).  You have HIV or AIDS.  You use needles to inject street drugs.  You live with, or have sex with, someone who has hepatitis B.  You are a man who has sex with other men (MSM).  You get hemodialysis treatment.  You take certain medicines for conditions like cancer, organ transplantation, and autoimmune conditions.  Hepatitis C blood testing is recommended for all people born from [birth year] through 1965 and any individual with known risk factors for hepatitis C.  Healthy men should no longer receive prostate-specific antigen (PSA) blood tests as part of routine cancer screening. Talk to your health care provider about prostate cancer screening.  Testicular cancer screening is not recommended for adolescents or adult males who have no symptoms. Screening includes self-exam, a health care provider exam, and other screening tests. Consult with your health care provider about any symptoms you have or any concerns you have about testicular  cancer.  Practice safe sex. Use condoms and avoid high-risk sexual practices to reduce the spread of sexually transmitted infections (STIs).  You should be screened for STIs, including gonorrhea and chlamydia if:  You are sexually active and are younger than 24 years.  You are older than 24 years, and your health care provider tells you that you are at risk for this type of infection.  Your sexual activity has changed since you were last screened, and you are at an increased risk for chlamydia or gonorrhea. Ask your health care provider if you are at risk.  If you are at risk of being infected with HIV, it is recommended that you take a prescription medicine daily to prevent HIV infection. This is called pre-exposure prophylaxis (PrEP). You are considered at risk if:  You are a man who has sex with other men (MSM).  You are a heterosexual man who is sexually active with multiple partners.  You take drugs by injection.  You are sexually active with a partner who has HIV.  Talk with your health care provider about whether you are at high risk of being infected with HIV. If you choose to begin PrEP, you should first be tested for HIV. You should then be tested every 3 months for as long as you are taking PrEP.  Use sunscreen. Apply sunscreen liberally and repeatedly throughout the day. You should seek shade when your shadow is shorter than you. Protect yourself by wearing long sleeves, pants, a wide-brimmed hat, and sunglasses year round whenever you are outdoors.  Tell your health care provider of new moles or changes in moles, especially if there is a change in shape or color. Also, tell your health care provider if a mole is larger than the size of a pencil eraser.  A one-time screening for abdominal aortic aneurysm (AAA) and surgical repair of large AAAs by ultrasound is recommended for men aged 82-75 years who are current or former smokers.  Stay current with your vaccines  (immunizations). Document Released: 09/16/2007 Document Revised: 03/25/2013 Document Reviewed: 08/15/2010 [facility] Patient Information 2015 [place], [place]. This information is not intended to replace advice given to you by your health care provider. Make sure you discuss any questions you have with your health care provider.

## 2014-04-23 ENCOUNTER — Ambulatory Visit (INDEPENDENT_AMBULATORY_CARE_PROVIDER_SITE_OTHER): Payer: Medicare Other | Admitting: Internal Medicine

## 2014-04-23 ENCOUNTER — Encounter: Payer: Self-pay | Admitting: Internal Medicine

## 2014-04-23 DIAGNOSIS — M150 Primary generalized (osteo)arthritis: Secondary | ICD-10-CM

## 2014-04-23 DIAGNOSIS — M159 Polyosteoarthritis, unspecified: Secondary | ICD-10-CM

## 2014-04-23 DIAGNOSIS — R600 Localized edema: Secondary | ICD-10-CM

## 2014-04-23 DIAGNOSIS — M5440 Lumbago with sciatica, unspecified side: Secondary | ICD-10-CM

## 2014-04-23 DIAGNOSIS — Z23 Encounter for immunization: Secondary | ICD-10-CM

## 2014-04-23 MED ORDER — OXYCODONE HCL 10 MG PO TABS: 10.0000 mg | ORAL_TABLET | ORAL | Status: DC | PRN

## 2014-04-23 NOTE — Progress Notes (Signed)
Pre visit review using our clinic review tool, if applicable. No additional management support is needed unless otherwise documented below in the visit note. 

## 2014-04-23 NOTE — Progress Notes (Signed)
Subjective:    Patient ID: [name], male    DOB: 1946-02-18, 69 y.o.   MRN: [id number]  HPI 69 year old patient who is seen today for follow-up of lower extremity edema.  He was placed on a restricted salt diet, and furosemide 40 mg daily.  His weight is down 11 pounds in his lower extremity edema has resolved. He has run out of oxycodone early due to increased lower extremity pain related to the edema.  He has been getting oxycodone from the [place]  360 tablets monthly.  Past Medical History  Diagnosis Date  . ALLERGIC RHINITIS   . GOUT   . LOW BACK PAIN   . OSTEOARTHRITIS   . PULMONARY NODULE, LEFT LOWER LOBE   . TESTICULAR HYPOFUNCTION     History   Social History  . Marital Status: Married    Spouse Name: N/A    Number of Children: N/A  . Years of Education: N/A   Occupational History  . Not on file.   Social History Main Topics  . Smoking status: Never Smoker   . Smokeless tobacco: Never Used  . Alcohol Use: 5.0 oz/week    10 drink(s) per week  . Drug Use: No  . Sexual Activity: Not on file   Other Topics Concern  . Not on file   Social History Narrative    Past Surgical History  Procedure Laterality Date  . Colostomy    . Knee surgery      3 on each knee  . Ankle surgery      4 times  . Elbow surgery      torn tendon  . Tonsillectomy and adenoidectomy    . Inguinal hernia repair      right    No family history on file.  No Known Allergies  Current Outpatient Prescriptions on File Prior to Visit  Medication Sig Dispense Refill  . allopurinol (ZYLOPRIM) 100 MG tablet Take 5 tablets (500 mg total) by mouth daily. (Patient taking differently: Take 500 mg by mouth daily. Taking 4 tablets daily) 150 tablet 2  . aspirin 325 MG tablet Take 325 mg by mouth daily.      . Cholecalciferol (VITAMIN D-3) 1000 UNITS CAPS Take 1,000 Units by mouth 2 (two) times daily.    . clobetasol ointment (TEMOVATE) 1.70 % Apply 1 application topically daily.     .  colchicine 0.6 MG tablet Take 0.6 mg by mouth as needed. gout    . furosemide (LASIX) 40 MG tablet Take 1 tablet (40 mg total) by mouth daily. 30 tablet 3  . ibuprofen (ADVIL,MOTRIN) 600 MG tablet Take 600 mg by mouth every 8 (eight) hours as needed.      . indomethacin (INDOCIN) 50 MG capsule Take 1 capsule (50 mg total) by mouth 3 (three) times daily as needed. 90 capsule 1  . methocarbamol (ROBAXIN) 750 MG tablet Take 750 mg by mouth as needed.      . Multiple Vitamin (MULTIVITAMIN) tablet Take 1 tablet by mouth daily.    . Oxycodone HCl 10 MG TABS Take 5 mg by mouth every 4 (four) hours as needed (Prescribed by [place]). Takes 2 tablets to equal 10 mg every 4 hours as needed    . ranitidine (ZANTAC) 150 MG tablet Take 150 mg by mouth 2 (two) times daily.    Marland Kitchen terazosin (HYTRIN) 5 MG capsule Take 5 mg by mouth at bedtime.     . vitamin B-12 (CYANOCOBALAMIN) 1000 MCG  tablet Take 1,000 mcg by mouth daily.     No current facility-administered medications on file prior to visit.    BP 126/74 mmHg  Pulse 82  Temp(Src) 97.8 F (36.6 C) (Oral)  Resp 20  Ht 6' 0.25" (1.835 m)  Wt 214 lb (97.07 kg)  BMI 28.83 kg/m2  SpO2 98%      Review of Systems  Constitutional: Negative for fever, chills, appetite change and fatigue.  HENT: Negative for congestion, dental problem, ear pain, hearing loss, sore throat, tinnitus, trouble swallowing and voice change.   Eyes: Negative for pain, discharge and visual disturbance.  Respiratory: Negative for cough, chest tightness, wheezing and stridor.   Cardiovascular: Positive for leg swelling. Negative for chest pain and palpitations.  Gastrointestinal: Negative for nausea, vomiting, abdominal pain, diarrhea, constipation, blood in stool and abdominal distention.  Genitourinary: Negative for urgency, hematuria, flank pain, discharge, difficulty urinating and genital sores.  Musculoskeletal: Negative for myalgias, back pain, joint swelling, arthralgias, gait  problem and neck stiffness.  Skin: Negative for rash.  Neurological: Negative for dizziness, syncope, speech difficulty, weakness, numbness and headaches.  Hematological: Negative for adenopathy. Does not bruise/bleed easily.  Psychiatric/Behavioral: Negative for behavioral problems and dysphoric mood. The patient is not nervous/anxious.        Objective:   Physical Exam  Constitutional: He is oriented to person, place, and time. He appears well-developed.  HENT:  Head: Normocephalic.  Right Ear: External ear normal.  Left Ear: External ear normal.  Eyes: Conjunctivae and EOM are normal.  Neck: Normal range of motion.  Cardiovascular: Normal rate and normal heart sounds.   Pulmonary/Chest: Breath sounds normal.  Abdominal: Bowel sounds are normal.  Musculoskeletal: Normal range of motion. He exhibits no edema or tenderness.  Edema distal to the knees has resolved  Neurological: He is alert and oriented to person, place, and time.  Psychiatric: He has a normal mood and affect. His behavior is normal.          Assessment & Plan:   Lower extremity edema, resolved.  Will continue restricted salt diet.  Will decrease furosemide to Monday, Wednesday, Friday regimen.  If he remains stable on this regimen.  Will change to a when necessary regimen only Chronic oxycodone use.  Patient was given a limited supply today.  He was told that all further refills should be obtained from the [facility] system

## 2014-04-23 NOTE — Addendum Note (Signed)
Addended by: Marian Sorrow on: 04/23/2014 04:00 PM   Modules accepted: Orders

## 2014-04-23 NOTE — Patient Instructions (Signed)
Limit your sodium (Salt) intake  Call or return to clinic prn if these symptoms worsen or fail to improve as anticipated.   

## 2014-06-25 ENCOUNTER — Ambulatory Visit (INDEPENDENT_AMBULATORY_CARE_PROVIDER_SITE_OTHER): Payer: Medicare Other | Admitting: Internal Medicine

## 2014-06-25 ENCOUNTER — Encounter: Payer: Self-pay | Admitting: Internal Medicine

## 2014-06-25 VITALS — BP 150/80 | HR 86 | Temp 98.1°F | Resp 20 | Ht 72.25 in | Wt 219.0 lb

## 2014-06-25 DIAGNOSIS — R7302 Impaired glucose tolerance (oral): Secondary | ICD-10-CM

## 2014-06-25 DIAGNOSIS — R531 Weakness: Secondary | ICD-10-CM | POA: Diagnosis not present

## 2014-06-25 DIAGNOSIS — E538 Deficiency of other specified B group vitamins: Secondary | ICD-10-CM

## 2014-06-25 LAB — CBC WITH DIFFERENTIAL/PLATELET
Basophils Absolute: 0 10*3/uL (ref 0.0–0.1)
Basophils Relative: 0.7 % (ref 0.0–3.0)
Eosinophils Absolute: 0.1 10*3/uL (ref 0.0–0.7)
Eosinophils Relative: 1.2 % (ref 0.0–5.0)
HCT: 37.8 % — ABNORMAL LOW (ref 39.0–52.0)
Hemoglobin: 13.1 g/dL (ref 13.0–17.0)
Lymphocytes Relative: 32.7 % (ref 12.0–46.0)
Lymphs Abs: 1.5 10*3/uL (ref 0.7–4.0)
MCHC: 34.8 g/dL (ref 30.0–36.0)
MCV: 99.4 fl (ref 78.0–100.0)
Monocytes Absolute: 0.4 10*3/uL (ref 0.1–1.0)
Monocytes Relative: 9.4 % (ref 3.0–12.0)
Neutro Abs: 2.6 10*3/uL (ref 1.4–7.7)
Neutrophils Relative %: 56 % (ref 43.0–77.0)
Platelets: 196 10*3/uL (ref 150.0–400.0)
RBC: 3.8 Mil/uL — ABNORMAL LOW (ref 4.22–5.81)
RDW: 13.6 % (ref 11.5–15.5)
WBC: 4.7 10*3/uL (ref 4.0–10.5)

## 2014-06-25 LAB — SEDIMENTATION RATE: Sed Rate: 56 mm/hr — ABNORMAL HIGH (ref 0–22)

## 2014-06-25 LAB — HEMOGLOBIN A1C: Hgb A1c MFr Bld: 5.4 % (ref 4.6–6.5)

## 2014-06-25 LAB — VITAMIN B12: Vitamin B-12: 629 pg/mL (ref 211–911)

## 2014-06-25 NOTE — Progress Notes (Signed)
   Subjective:    Patient ID: [name], male    DOB: [birth year], 69 y.o.   MRN: [id number]  HPI  Wt Readings from Last 3 Encounters:  06/25/14 219 lb (99.338 kg)  04/23/14 214 lb (97.07 kg)  04/09/14 225 lb (102.059 kg)    Review of Systems     Objective:   Physical Exam        Assessment & Plan:

## 2014-06-25 NOTE — Patient Instructions (Signed)
Limit your sodium (Salt) intake  [facility].  Follow-up as scheduled  High-fiber diet  High-Fiber Diet Fiber is found in fruits, vegetables, and grains. A high-fiber diet encourages the addition of more whole grains, legumes, fruits, and vegetables in your diet. The recommended amount of fiber for adult males is 38 g per day. For adult females, it is 25 g per day. Pregnant and lactating women should get 28 g of fiber per day. If you have a digestive or bowel problem, ask your caregiver for advice before adding high-fiber foods to your diet. Eat a variety of high-fiber foods instead of only a select few type of foods.  PURPOSE  To increase stool bulk.  To make bowel movements more regular to prevent constipation.  To lower cholesterol.  To prevent overeating. WHEN IS THIS DIET USED?  It may be used if you have constipation and hemorrhoids.  It may be used if you have uncomplicated diverticulosis (intestine condition) and irritable bowel syndrome.  It may be used if you need help with weight management.  It may be used if you want to add it to your diet as a protective measure against atherosclerosis, diabetes, and cancer. SOURCES OF FIBER  Whole-grain breads and cereals.  Fruits, such as apples, oranges, bananas, berries, prunes, and pears.  Vegetables, such as green peas, carrots, sweet potatoes, beets, broccoli, cabbage, spinach, and artichokes.  Legumes, such split peas, soy, lentils.  Almonds. FIBER CONTENT IN FOODS Starches and Grains / Dietary Fiber (g)  Cheerios, 1 cup / 3 g  Corn Flakes cereal, 1 cup / 0.7 g  Rice crispy treat cereal, 1 cup / 0.3 g  Instant oatmeal (cooked),  cup / 2 g  Frosted wheat cereal, 1 cup / 5.1 g  Brown, long-grain rice (cooked), 1 cup / 3.5 g  White, long-grain rice (cooked), 1 cup / 0.6 g  Enriched macaroni (cooked), 1 cup / 2.5 g Legumes / Dietary Fiber (g)  Baked beans (canned, plain, or vegetarian),  cup / 5.2  g  Kidney beans (canned),  cup / 6.8 g  Pinto beans (cooked),  cup / 5.5 g Breads and Crackers / Dietary Fiber (g)  Plain or honey graham crackers, 2 squares / 0.7 g  Saltine crackers, 3 squares / 0.3 g  Plain, salted pretzels, 10 pieces / 1.8 g  Whole-wheat bread, 1 slice / 1.9 g  White bread, 1 slice / 0.7 g  Raisin bread, 1 slice / 1.2 g  Plain bagel, 3 oz / 2 g  Flour tortilla, 1 oz / 0.9 g  Corn tortilla, 1 small / 1.5 g  Hamburger or hotdog bun, 1 small / 0.9 g Fruits / Dietary Fiber (g)  Apple with skin, 1 medium / 4.4 g  Sweetened applesauce,  cup / 1.5 g  Banana,  medium / 1.5 g  Grapes, 10 grapes / 0.4 g  Orange, 1 small / 2.3 g  Raisin, 1.5 oz / 1.6 g  Melon, 1 cup / 1.4 g Vegetables / Dietary Fiber (g)  Green beans (canned),  cup / 1.3 g  Carrots (cooked),  cup / 2.3 g  Broccoli (cooked),  cup / 2.8 g  Peas (cooked),  cup / 4.4 g  Mashed potatoes,  cup / 1.6 g  Lettuce, 1 cup / 0.5 g  Corn (canned),  cup / 1.6 g  Tomato,  cup / 1.1 g Document Released: 03/20/2005 Document Revised: 09/19/2011 Document Reviewed: 06/22/2011 [employer] Patient Information 2015 [place], [place].  This information is not intended to replace advice given to you by your health care provider. Make sure you discuss any questions you have with your health care provider.

## 2014-06-25 NOTE — Progress Notes (Signed)
Pre visit review using our clinic review tool, if applicable. No additional management support is needed unless otherwise documented below in the visit note. 

## 2014-06-25 NOTE — Progress Notes (Signed)
Subjective:    Patient ID: [name], male    DOB: May 13, 1945, 69 y.o.   MRN: [id number]  HPI Wt Readings from Last 3 Encounters:  06/25/14 219 lb (99.338 kg)  04/23/14 214 lb (97.07 kg)  04/09/14 225 lb (102.91 kg)    69 year old patient who presents with a three-week history of fatigue.  He does have arthritis and chronic low back pain and is followed the [facility] hospital system.  He is scheduled for follow-up next month.  Remains on oxycodone 10 mg 4 times a day Over these past 3 weeks he has developed loose watery stool and some nonspecific abdominal discomfort. He does have a prior history of weakness.  He also has a history of B12 deficiency, treated with oral supplements.  No nausea, vomiting, or weight loss. His lower extremity edema has been stable  Past Medical History  Diagnosis Date  . ALLERGIC RHINITIS   . GOUT   . LOW BACK PAIN   . OSTEOARTHRITIS   . PULMONARY NODULE, LEFT LOWER LOBE   . TESTICULAR HYPOFUNCTION     History   Social History  . Marital Status: Married    Spouse Name: N/A  . Number of Children: N/A  . Years of Education: N/A   Occupational History  . Not on file.   Social History Main Topics  . Smoking status: Never Smoker   . Smokeless tobacco: Never Used  . Alcohol Use: 5.0 oz/week    10 drink(s) per week  . Drug Use: No  . Sexual Activity: Not on file   Other Topics Concern  . Not on file   Social History Narrative    Past Surgical History  Procedure Laterality Date  . Colostomy    . Knee surgery      3 on each knee  . Ankle surgery      4 times  . Elbow surgery      torn tendon  . Tonsillectomy and adenoidectomy    . Inguinal hernia repair      right    No family history on file.  No Known Allergies  Current Outpatient Prescriptions on File Prior to Visit  Medication Sig Dispense Refill  . allopurinol (ZYLOPRIM) 100 MG tablet Take 5 tablets (500 mg total) by mouth daily. (Patient taking differently: Take 500 mg  by mouth daily. Taking 4 tablets daily) 150 tablet 2  . aspirin 325 MG tablet Take 325 mg by mouth daily.      . Cholecalciferol (VITAMIN D-3) 1000 UNITS CAPS Take 1,000 Units by mouth 2 (two) times daily.    . clobetasol ointment (TEMOVATE) 5.28 % Apply 1 application topically daily.     . colchicine 0.6 MG tablet Take 0.6 mg by mouth as needed. gout    . furosemide (LASIX) 40 MG tablet Take 1 tablet (40 mg total) by mouth daily. 30 tablet 3  . ibuprofen (ADVIL,MOTRIN) 600 MG tablet Take 600 mg by mouth every 8 (eight) hours as needed.      . indomethacin (INDOCIN) 50 MG capsule Take 1 capsule (50 mg total) by mouth 3 (three) times daily as needed. 90 capsule 1  . methocarbamol (ROBAXIN) 750 MG tablet Take 750 mg by mouth as needed.      . Multiple Vitamin (MULTIVITAMIN) tablet Take 1 tablet by mouth daily.    . Oxycodone HCl 10 MG TABS Take 1 tablet (10 mg total) by mouth every 4 (four) hours as needed (Prescribed by [place]). Takes  2 tablets to equal 10 mg every 4 hours as needed 60 tablet 0  . ranitidine (ZANTAC) 150 MG tablet Take 150 mg by mouth 2 (two) times daily.    Marland Kitchen terazosin (HYTRIN) 5 MG capsule Take 5 mg by mouth at bedtime.     . vitamin B-12 (CYANOCOBALAMIN) 1000 MCG tablet Take 1,000 mcg by mouth daily.     No current facility-administered medications on file prior to visit.    BP 150/80 mmHg  Pulse 86  Temp(Src) 98.1 F (36.7 C) (Oral)  Resp 20  Ht 6' 0.25" (1.835 m)  Wt 219 lb (99.338 kg)  BMI 29.50 kg/m2  SpO2 99%      Review of Systems  Constitutional: Positive for fatigue. Negative for fever, chills and appetite change.  HENT: Negative for congestion, dental problem, ear pain, hearing loss, sore throat, tinnitus, trouble swallowing and voice change.   Eyes: Negative for pain, discharge and visual disturbance.  Respiratory: Negative for cough, chest tightness, wheezing and stridor.   Cardiovascular: Negative for chest pain, palpitations and leg swelling.    Gastrointestinal: Positive for abdominal pain and diarrhea. Negative for nausea, vomiting, constipation, blood in stool and abdominal distention.  Genitourinary: Negative for urgency, hematuria, flank pain, discharge, difficulty urinating and genital sores.  Musculoskeletal: Negative for myalgias, back pain, joint swelling, arthralgias, gait problem and neck stiffness.  Skin: Negative for rash.  Neurological: Positive for weakness. Negative for dizziness, syncope, speech difficulty, numbness and headaches.  Hematological: Negative for adenopathy. Does not bruise/bleed easily.  Psychiatric/Behavioral: Negative for behavioral problems and dysphoric mood. The patient is not nervous/anxious.        Objective:   Physical Exam  Constitutional: He is oriented to person, place, and time. He appears well-developed.  HENT:  Head: Normocephalic.  Right Ear: External ear normal.  Left Ear: External ear normal.  Eyes: Conjunctivae and EOM are normal.  Neck: Normal range of motion.  Cardiovascular: Normal rate and normal heart sounds.   Pulmonary/Chest: Effort normal and breath sounds normal.  Abdominal: Soft. Bowel sounds are normal. He exhibits no distension. There is no tenderness. There is no rebound and no guarding.  Musculoskeletal: Normal range of motion. He exhibits no edema or tenderness.  Neurological: He is alert and oriented to person, place, and time.  Psychiatric: He has a normal mood and affect. His behavior is normal.          Assessment & Plan:  Fatigue of 3 weeks duration associated with change in bowel habits.  Suspect resolving gastroenteritis.  No travel history Osteoarthritis, low back pain, chronic pain syndrome. History of B12 deficiency.  We'll check a B12 level History of MGUS .  Follow-up oncology

## 2014-06-29 ENCOUNTER — Telehealth: Payer: Self-pay | Admitting: Internal Medicine

## 2014-06-29 NOTE — Telephone Encounter (Signed)
Pt would like a call back about lab results ..  °

## 2014-06-29 NOTE — Telephone Encounter (Signed)
Spoke to pt, told him labs normal except for sedimentation rate that is minimally elevated at 56 due to arthritis; all of the lab including B12 level normal per Dr. Raliegh Ip. Pt verbalized understanding.

## 2014-06-29 NOTE — Telephone Encounter (Signed)
Please call/notify patient that lab/test/procedure is normal except for sedimentation rate that is minimally elevated due to arthritis; all of the lab including B12 level normal

## 2014-06-29 NOTE — Telephone Encounter (Signed)
Pt calling for lab results, please review and advise.

## 2014-09-07 ENCOUNTER — Telehealth: Payer: Self-pay | Admitting: Internal Medicine

## 2014-09-07 DIAGNOSIS — Q396 Congenital diverticulum of esophagus: Secondary | ICD-10-CM

## 2014-09-07 NOTE — Telephone Encounter (Signed)
Spoke to pt, told him I sent order for referral to ENT and someone will contact him regarding an appointment. Pt verbalized understanding.

## 2014-09-07 NOTE — Telephone Encounter (Signed)
Please advise if okay to do referral to ENT?

## 2014-09-07 NOTE — Telephone Encounter (Signed)
ok 

## 2014-09-07 NOTE — Telephone Encounter (Signed)
Patient would like a referral to see ENT.  He is requesting a callback instead of OV.

## 2014-09-25 ENCOUNTER — Ambulatory Visit: Payer: Medicare Other | Admitting: Internal Medicine

## 2014-12-10 ENCOUNTER — Other Ambulatory Visit: Payer: Medicare Other

## 2014-12-11 ENCOUNTER — Telehealth: Payer: Self-pay | Admitting: Oncology

## 2014-12-11 NOTE — Telephone Encounter (Signed)
Returned Advertising account executive. Confirmed appointment for October

## 2014-12-17 ENCOUNTER — Ambulatory Visit: Payer: Medicare Other | Admitting: Oncology

## 2015-01-14 ENCOUNTER — Other Ambulatory Visit (HOSPITAL_BASED_OUTPATIENT_CLINIC_OR_DEPARTMENT_OTHER): Payer: Medicare Other

## 2015-01-14 DIAGNOSIS — D472 Monoclonal gammopathy: Secondary | ICD-10-CM

## 2015-01-14 LAB — CBC WITH DIFFERENTIAL/PLATELET
BASO%: 0.4 % (ref 0.0–2.0)
Basophils Absolute: 0 10*3/uL (ref 0.0–0.1)
EOS%: 0.6 % (ref 0.0–7.0)
Eosinophils Absolute: 0 10*3/uL (ref 0.0–0.5)
HCT: 39.5 % (ref 38.4–49.9)
HGB: 13.6 g/dL (ref 13.0–17.1)
LYMPH%: 30 % (ref 14.0–49.0)
MCH: 34.2 pg — ABNORMAL HIGH (ref 27.2–33.4)
MCHC: 34.4 g/dL (ref 32.0–36.0)
MCV: 99.2 fL — ABNORMAL HIGH (ref 79.3–98.0)
MONO#: 0.7 10*3/uL (ref 0.1–0.9)
MONO%: 9.6 % (ref 0.0–14.0)
NEUT#: 4 10*3/uL (ref 1.5–6.5)
NEUT%: 59.4 % (ref 39.0–75.0)
Platelets: 202 10*3/uL (ref 140–400)
RBC: 3.98 10*6/uL — ABNORMAL LOW (ref 4.20–5.82)
RDW: 13.1 % (ref 11.0–14.6)
WBC: 6.8 10*3/uL (ref 4.0–10.3)
lymph#: 2 10*3/uL (ref 0.9–3.3)

## 2015-01-14 LAB — COMPREHENSIVE METABOLIC PANEL (CC13)
ALT: 34 U/L (ref 0–55)
AST: 35 U/L — ABNORMAL HIGH (ref 5–34)
Albumin: 3.7 g/dL (ref 3.5–5.0)
Alkaline Phosphatase: 108 U/L (ref 40–150)
Anion Gap: 9 mEq/L (ref 3–11)
BUN: 16.6 mg/dL (ref 7.0–26.0)
CO2: 26 mEq/L (ref 22–29)
Calcium: 9.1 mg/dL (ref 8.4–10.4)
Chloride: 105 mEq/L (ref 98–109)
Creatinine: 0.8 mg/dL (ref 0.7–1.3)
EGFR: >90 mL/min/{1.73_m2} (ref >90)
Glucose: 104 mg/dl (ref 70–140)
Potassium: 4.3 mEq/L (ref 3.5–5.1)
Sodium: 140 mEq/L (ref 136–145)
Total Bilirubin: 0.92 mg/dL (ref 0.20–1.20)
Total Protein: 7.1 g/dL (ref 6.4–8.3)

## 2015-01-18 LAB — SPEP & IFE WITH QIG
Abnormal Protein Band1: 0.2 g/dL
Abnormal Protein Band2: 0.9 g/dL
Abnormal Protein Band3: NOT DETECTED g/dL
Albumin ELP: 4 g/dL (ref 3.8–4.8)
Alpha-1-Globulin: 0.3 g/dL (ref 0.2–0.3)
Alpha-2-Globulin: 0.7 g/dL (ref 0.5–0.9)
Beta 2: 0.3 g/dL (ref 0.2–0.5)
Beta Globulin: 0.4 g/dL (ref 0.4–0.6)
Gamma Globulin: 1.4 g/dL (ref 0.8–1.7)
IgA: 89 mg/dL (ref 68–379)
IgG (Immunoglobin G), Serum: 372 mg/dL — ABNORMAL LOW (ref 650–1600)
IgM, Serum: 1540 mg/dL — ABNORMAL HIGH (ref 41–251)
Total Protein, Serum Electrophoresis: 7 g/dL (ref 6.1–8.1)

## 2015-01-21 ENCOUNTER — Ambulatory Visit (HOSPITAL_BASED_OUTPATIENT_CLINIC_OR_DEPARTMENT_OTHER): Payer: Medicare Other | Admitting: Oncology

## 2015-01-21 ENCOUNTER — Telehealth: Payer: Self-pay | Admitting: Oncology

## 2015-01-21 VITALS — BP 148/76 | HR 92 | Temp 98.1°F | Resp 18 | Ht 72.25 in | Wt 205.0 lb

## 2015-01-21 DIAGNOSIS — R21 Rash and other nonspecific skin eruption: Secondary | ICD-10-CM

## 2015-01-21 DIAGNOSIS — D472 Monoclonal gammopathy: Secondary | ICD-10-CM

## 2015-01-21 NOTE — Telephone Encounter (Signed)
Gave adn printed appt sched and avs for pt for OCT 2017

## 2015-01-21 NOTE — Progress Notes (Signed)
Hematology and Oncology Follow Up Visit  [name] [id number] [birth year] 69 y.o. 01/21/2015 3:49 PM   Principle Diagnosis: 69 year old gentleman with a monoclonal protein in the form of IgM. kappa with an IgM level of 1510. Differential diagnosis inculdes plasma cell disorder lymphoproliferative disorder such as non-Hodgkin's lymphoma or reactive findings.  Current therapy: Observation and surveillance.  Interim History: [name] presents today for a followup visit. Since the last visit, he continues to be about the same. He does have chronic knee and ankle pain related to multiple surgeries he had in the past. He has not had any new symptoms. Has not reported any recent pathological fractures or infections. Has not reported any weight loss or appetite changes. Performance status and activity level remains the same. He does not report any lymphadenopathy, as well as satiety or petechiae. He does report occasional cramps in his hands after a period of time driving.  He does not report any headaches or blurry vision or syncope. Does not report any chest pain or shortness of breath. Does not report any cough or hemoptysis. He does not report any nausea, vomiting, abdominal pain. Does not report any constipation or diarrhea. Rest of his review of systems unremarkable.  Medications: I have reviewed the patient's current medications.  Current Outpatient Prescriptions  Medication Sig Dispense Refill  . allopurinol (ZYLOPRIM) 100 MG tablet Take 5 tablets (500 mg total) by mouth daily. (Patient taking differently: Take 500 mg by mouth daily. Taking 4 tablets daily) 150 tablet 2  . aspirin 325 MG tablet Take 325 mg by mouth daily.      . Cholecalciferol (VITAMIN D-3) 1000 UNITS CAPS Take 1,000 Units by mouth 2 (two) times daily.    . clobetasol ointment (TEMOVATE) 5.95 % Apply 1 application topically daily.     . colchicine 0.6 MG tablet Take 0.6 mg by mouth as needed. gout    . ibuprofen  (ADVIL,MOTRIN) 600 MG tablet Take 600 mg by mouth every 8 (eight) hours as needed.      . indomethacin (INDOCIN) 50 MG capsule Take 1 capsule (50 mg total) by mouth 3 (three) times daily as needed. 90 capsule 1  . methocarbamol (ROBAXIN) 750 MG tablet Take 750 mg by mouth as needed.      . Multiple Vitamin (MULTIVITAMIN) tablet Take 1 tablet by mouth daily.    Marland Kitchen omeprazole (PRILOSEC) 20 MG capsule Take 20 mg by mouth daily. Patient states he is taking 40 mg  11  . Oxycodone HCl 10 MG TABS Take 1 tablet (10 mg total) by mouth every 4 (four) hours as needed (Prescribed by [place]). Takes 2 tablets to equal 10 mg every 4 hours as needed 60 tablet 0  . terazosin (HYTRIN) 5 MG capsule Take 5 mg by mouth at bedtime.     . vitamin B-12 (CYANOCOBALAMIN) 1000 MCG tablet Take 1,000 mcg by mouth daily.     No current facility-administered medications for this visit.     Allergies: No Known Allergies  Past Medical History, Surgical history, Social history, and Family History were reviewed and updated.   Physical Exam: Blood pressure 148/76, pulse 92, temperature 98.1 F (36.7 C), temperature source Oral, resp. rate 18, height 6' 0.25" (1.835 m), weight 205 lb (92.987 kg), SpO2 100 %. ECOG: 1 General appearance: alert and cooperative not in any distress. Head: Normocephalic, without obvious abnormality Neck: no adenopathy Lymph nodes: Cervical, supraclavicular, and axillary nodes normal. Heart:regular rate and rhythm, S1, S2 normal, no murmur,  click, rub or gallop Lung:chest clear, no wheezing, rales, normal symmetric air entry Abdomin: soft, non-tender, without masses or organomegaly EXT:no erythema, induration, or nodules   Lab Results: Lab Results  Component Value Date   WBC 6.8 01/14/2015   HGB 13.6 01/14/2015   HCT 39.5 01/14/2015   MCV 99.2* 01/14/2015   PLT 202 01/14/2015     Chemistry      Component Value Date/Time   NA 140 01/14/2015 1317   NA 139 01/22/2013 0849   K 4.3  01/14/2015 1317   K 4.1 01/22/2013 0849   CL 99 01/22/2013 0849   CO2 26 01/14/2015 1317   CO2 31 01/22/2013 0849   BUN 16.6 01/14/2015 1317   BUN 16 01/22/2013 0849   CREATININE 0.8 01/14/2015 1317   CREATININE 0.8 01/22/2013 0849   CREATININE 1.04 11/23/2010 1137      Component Value Date/Time   CALCIUM 9.1 01/14/2015 1317   CALCIUM 9.2 01/22/2013 0849   ALKPHOS 108 01/14/2015 1317   ALKPHOS 98 01/22/2013 0849   AST 35* 01/14/2015 1317   AST 31 01/22/2013 0849   ALT 34 01/14/2015 1317   ALT 20 01/22/2013 0849   BILITOT 0.92 01/14/2015 1317   BILITOT 0.8 01/22/2013 0849      Results for David, [name] (MRN [id number]) as of 01/21/2015 15:09  Ref. Range 03/03/2014 11:33 01/14/2015 13:18  IgG (Immunoglobin G), Serum Latest Ref Range: [phone] mg/dL 527 (L) 372 (L)  IgA Latest Ref Range: 68-379 mg/dL 109 89  IgM, Serum Latest Ref Range: 41-251 mg/dL 1390 (H) 1540 (H)  Total Protein, Serum Electrophoresis Latest Ref Range: 6.1-8.1 g/dL 7.1 7.0     Impression and Plan:  69 year old gentleman with:  1. IgM monoclonal protein with an M spike of a less than 1 g/dL. laboratory data repeated in 01/14/2015 were reviewed today and have been relatively stable. His IgM level is 1540. His workup does not reveal any plasma cell disorder or a lymphoproliferative disorder at this time. The differential diagnosis to include reactive findings due to autoimmune disorders. The plan is to continue with active surveillance and repeat the studies in 12 months. Staging with a bone marrow biopsy and a CT scan will be needed if he develops symptomatology or worsening cytopenias.  2. Diffuse rash: Seems to be improving on prednisone.  Zola Button, MD 10/20/20163:49 PM

## 2015-04-20 ENCOUNTER — Ambulatory Visit (INDEPENDENT_AMBULATORY_CARE_PROVIDER_SITE_OTHER): Payer: Medicare Other | Admitting: Internal Medicine

## 2015-04-20 ENCOUNTER — Other Ambulatory Visit: Payer: Self-pay | Admitting: Internal Medicine

## 2015-04-20 ENCOUNTER — Encounter: Payer: Self-pay | Admitting: Internal Medicine

## 2015-04-20 VITALS — BP 148/72 | HR 84 | Temp 97.9°F | Resp 20 | Ht 71.75 in | Wt 221.0 lb

## 2015-04-20 DIAGNOSIS — E538 Deficiency of other specified B group vitamins: Secondary | ICD-10-CM

## 2015-04-20 DIAGNOSIS — M150 Primary generalized (osteo)arthritis: Secondary | ICD-10-CM

## 2015-04-20 DIAGNOSIS — M109 Gout, unspecified: Secondary | ICD-10-CM | POA: Diagnosis not present

## 2015-04-20 DIAGNOSIS — M5440 Lumbago with sciatica, unspecified side: Secondary | ICD-10-CM

## 2015-04-20 DIAGNOSIS — J309 Allergic rhinitis, unspecified: Secondary | ICD-10-CM | POA: Diagnosis not present

## 2015-04-20 DIAGNOSIS — M159 Polyosteoarthritis, unspecified: Secondary | ICD-10-CM

## 2015-04-20 DIAGNOSIS — Z86010 Personal history of colonic polyps: Secondary | ICD-10-CM

## 2015-04-20 DIAGNOSIS — Z0000 Encounter for general adult medical examination without abnormal findings: Secondary | ICD-10-CM

## 2015-04-20 NOTE — Patient Instructions (Signed)

## 2015-04-20 NOTE — Progress Notes (Signed)
Subjective:    Patient ID: [name], male    DOB: 1945-09-16, 70 y.o.   MRN: [id number]  HPI  CC: cpx - doing well.   History of Present Illness:   70   year-old patient who is seen today for an annual exam.  He has been followed by oncology with MGUS; he is also followed by rheumatology and orthopedics, urology and the [facility] hospital system. Medical problems include B12 deficiency,controlled on oral supplements.  He has a history of gout, which has been stable.  Patient is followed the [facility] who recently has added morphine sulfate extended release to his regimen   Allergies (verified):   No Known Drug Allergies   Past History:   Low back pain  Osteoarthritis  bilateral knee pain  Gout  Allergic rhinitis   6-mm left lower lobe pulmonary nodule  B12 deficiency MGUS  Past Surgical History:  multiple knee surgeries bilaterally from the 1970s through the 1990s; 3 on the left and 3 on the right  colonoscopy ([facility]) ,  6/12 status post right inguinal hernia repair July 2010   Family History:   father died age 34;  mother, age 21- died breast ca age 42  One sister in good health   Social History:   Retired  Married  Never Smoked  two adult children   1. Risk factors, based on past  M,S,F history cardiovascular risk factors- none  2.  Physical activities: No major limitations although somewhat limited due to bilateral knee and back pain  3.  Depression/mood: No history of depression or mood disorder although he does have chronic pain 4.  Hearing: No deficits  5.  ADL's: Independent in all aspects of daily living  6.  Fall risk: low 7.  Home safety: No problems identified 8.  Height weight, and visual acuity; height and weight stable no change in visual acuity  9.  Counseling: Low salt diet regular exercise are encouraged  10. Lab orders based on risk factors: Laboratory profile will be reviewed. He has had a recent PSA  11. Referral : Followup urology and  orthopedics, oncology  12. Care plan: The patient wishes to have a pneumococcal vaccine at the [facility]. 13. Cognitive assessment: Alert and oriented with normal affect 14.  Preventive services will include annual primary care evaluations.  He is followed closely by oncology, rheumatology and orthopedics.  Patient was provided with a written and personalized care plan 15.  Provider list includes rheumatology oncology orthopedic surgery urology       .    Review of Systems  Constitutional: Negative for fever, chills, activity change, appetite change and fatigue.  HENT: Negative for congestion, dental problem, ear pain, hearing loss, mouth sores, rhinorrhea, sinus pressure, sneezing, tinnitus, trouble swallowing and voice change.   Eyes: Negative for photophobia, pain, redness and visual disturbance.  Respiratory: Negative for apnea, cough, choking, chest tightness, shortness of breath and wheezing.   Cardiovascular: Positive for leg swelling. Negative for chest pain and palpitations.  Gastrointestinal: Positive for diarrhea. Negative for nausea, vomiting, constipation, blood in stool, abdominal distention, anal bleeding and rectal pain.  Genitourinary: Negative for dysuria, urgency, frequency, hematuria, flank pain, decreased urine volume, discharge, penile swelling, scrotal swelling, difficulty urinating, genital sores and testicular pain.  Musculoskeletal: Positive for back pain, joint swelling and gait problem. Negative for myalgias, arthralgias, neck pain and neck stiffness.  Skin: Negative for color change, rash and wound.  Neurological: Negative for dizziness, tremors, seizures, syncope, facial  asymmetry, speech difficulty, weakness, light-headedness, numbness and headaches.  Hematological: Negative for adenopathy. Does not bruise/bleed easily.  Psychiatric/Behavioral: Negative for suicidal ideas, hallucinations, behavioral problems, confusion, sleep disturbance, self-injury,  dysphoric mood, decreased concentration and agitation. The patient is not nervous/anxious.        Objective:   Physical Exam  Constitutional: He appears well-developed and well-nourished.  HENT:  Head: Normocephalic and atraumatic.  Right Ear: External ear normal.  Left Ear: External ear normal.  Nose: Nose normal.  Mouth/Throat: Oropharynx is clear and moist.  Eyes: Conjunctivae and EOM are normal. Pupils are equal, round, and reactive to light. No scleral icterus.  Neck: Normal range of motion. Neck supple. No JVD present. No thyromegaly present.  Cardiovascular: Regular rhythm, normal heart sounds and intact distal pulses.  Exam reveals no gallop and no friction rub.   No murmur heard. Pulmonary/Chest: Effort normal and breath sounds normal. He exhibits no tenderness.  Abdominal: Soft. Bowel sounds are normal. He exhibits no distension and no mass. There is no tenderness.  Genitourinary: Penis normal.  Musculoskeletal: Normal range of motion. He exhibits edema. He exhibits no tenderness.  Prominent pitting Edema distal to knees  Lymphadenopathy:    He has no cervical adenopathy.  Neurological: He is alert. He has normal reflexes. No cranial nerve deficit. Coordination normal.  Absent vibratory sensation distally  Skin: Skin is warm and dry. No rash noted.  Psychiatric: He has a normal mood and affect. His behavior is normal.          Assessment & Plan:    Preventive health examination  Osteoarthritis Chronic low back pain History of gout Allergic rhinitis B12 deficiency  MGUS

## 2015-04-20 NOTE — Progress Notes (Signed)
Pre visit review using our clinic review tool, if applicable. No additional management support is needed unless otherwise documented below in the visit note. 

## 2015-04-29 ENCOUNTER — Other Ambulatory Visit: Payer: Medicare Other

## 2015-05-06 ENCOUNTER — Other Ambulatory Visit: Payer: Self-pay | Admitting: Orthopaedic Surgery

## 2015-05-06 DIAGNOSIS — M25511 Pain in right shoulder: Secondary | ICD-10-CM

## 2015-05-10 ENCOUNTER — Other Ambulatory Visit: Payer: Medicare Other

## 2015-05-12 ENCOUNTER — Ambulatory Visit
Admission: RE | Admit: 2015-05-12 | Discharge: 2015-05-12 | Disposition: A | Discharge status: Home / Self Care | Payer: Medicare Other | Source: Ambulatory Visit | Attending: Orthopaedic Surgery | Admitting: Orthopaedic Surgery

## 2015-05-12 ENCOUNTER — Ambulatory Visit
Admission: RE | Admit: 2015-05-12 | Discharge: 2015-05-12 | Disposition: A | Discharge status: Home / Self Care | Payer: Medicare Other | Source: Ambulatory Visit | Attending: Internal Medicine | Admitting: Internal Medicine

## 2015-05-12 DIAGNOSIS — M5440 Lumbago with sciatica, unspecified side: Secondary | ICD-10-CM

## 2015-05-12 DIAGNOSIS — M25511 Pain in right shoulder: Secondary | ICD-10-CM

## 2015-05-12 MED ORDER — GADOBENATE DIMEGLUMINE 529 MG/ML IV SOLN
20.0000 mL | Freq: Once | INTRAVENOUS | Status: AC | PRN
Start: 2015-05-12 — End: 2015-05-12
  Administered 2015-05-12: 20 mL via INTRAVENOUS

## 2015-05-24 ENCOUNTER — Other Ambulatory Visit: Payer: Self-pay | Admitting: Orthopaedic Surgery

## 2015-06-07 NOTE — H&P (Signed)
[name] is an 70 y.o. male.   Chief Complaint: Right shoulder pain HPI: [name] is here with lots of questions revolving around his upcoming shoulder arthroscopy.  He also has questions about his knee braces.  He has concerns about his right elbow where he has a prominent structure anterolateral which he does not think has always been there.  He is worried something might have become detached in his upper arm that we might need to repair at the time of the shoulder surgery.  This is not as painful as the shoulder itself.  We did operate on the posterior aspect of this elbow for triceps repair about 11 years back.  MRI:  I reviewed an MRI scan films and report of a study done at [facility] on 05/12/15.  This shows some irritation of his rotator cuff but no full-thickness tear.  The biceps tendon looks normal.   Past Medical History  Diagnosis Date  . ALLERGIC RHINITIS   . GOUT   . LOW BACK PAIN   . OSTEOARTHRITIS   . PULMONARY NODULE, LEFT LOWER LOBE   . TESTICULAR HYPOFUNCTION     Past Surgical History  Procedure Laterality Date  . Colostomy    . Knee surgery      3 on each knee  . Ankle surgery      4 times  . Elbow surgery      torn tendon  . Tonsillectomy and adenoidectomy    . Inguinal hernia repair      right    No family history on file. Social History:  reports that he has never smoked. He has never used smokeless tobacco. He reports that he drinks about 5.0 oz of alcohol per week. He reports that he does not use illicit drugs.  Allergies: No Known Allergies  No prescriptions prior to admission    No results found for this or any previous visit (from the past 48 hour(s)). No results found.  Review of Systems  Musculoskeletal: Positive for joint pain.       Right shoulder  All other systems reviewed and are negative.   There were no vitals taken for this visit. Physical Exam  Constitutional: He is oriented to person, place, and time. He appears  well-developed and well-nourished.  HENT:  Head: Normocephalic and atraumatic.  Eyes: Conjunctivae are normal. Pupils are equal, round, and reactive to light.  Neck: Normal range of motion.  Cardiovascular: Normal rate and regular rhythm.   Respiratory: Effort normal.  GI: Soft.  Musculoskeletal:  Right elbow motion is full.  He has an intact biceps tendon which is palpable and painless.  He has good strength.  He does have a prominent tendon anterolateral which might potentially represent his brachioradialis but it is difficult to be sure.  Sensation and motor function are intact distally and he has a palpable pulse.    Neurological: He is alert and oriented to person, place, and time.  Skin: Skin is warm and dry.  Psychiatric: He has a normal mood and affect. His behavior is normal. Judgment and thought content normal.     Assessment/Plan Assessment: Right shoulder impingement injected 05/03/15  Plan:  [name] has pain at the right shoulder which limits what he can do and his ability to sleep.  Based on his MRI I think we can make things better with a simple subacromial decompression and debridement.  I reviewed risk of anesthesia and infection related this intervention.   Janele Lague, Larwance Sachs, PA-C  06/07/2015, 9:05 AM

## 2015-06-07 NOTE — Pre-Procedure Instructions (Signed)
MYSON PENSE  06/07/2015     Your procedure is scheduled on : Tuesday June 15, 2015 at 7:30 AM.  Report to [facility] Admitting at 5:30 AM.  Call this number if you have problems the morning of surgery: [phone]    Remember:  Do not eat food or drink liquids after midnight.  Take these medicines the morning of surgery with A SIP OF WATER : Allopurinol (Zyloprim), Finasteride (Proscar), Flonase nasal spray, Omeprazole (Prilosec), Oxycodone if needed, Cetirizine, Morphine    Stop taking any vitamins, herbal medications/supplements, Ibuprofen, Advil, Motrin, Aleve, Indomethacin/Indocin   Do not wear jewelry.  Do not wear lotions, powders, or cologne.              Men may shave face and neck.  Do not bring valuables to the hospital.  [facility] is not responsible for any belongings or valuables.  Contacts, dentures or bridgework may not be worn into surgery.  Leave your suitcase in the car.  After surgery it may be brought to your room.  For patients admitted to the hospital, discharge time will be determined by your treatment team.  Patients discharged the day of surgery will not be allowed to drive home.   Name and phone number of your driver:    Special instructions:  Shower using CHG soap the night before and the morning of your surgery  Please read over the following fact sheets that you were given. Pain Booklet, Coughing and Deep Breathing and Surgical Site Infection Prevention

## 2015-06-08 ENCOUNTER — Encounter (HOSPITAL_COMMUNITY)
Admission: RE | Admit: 2015-06-08 | Discharge: 2015-06-08 | Disposition: A | Discharge status: Home / Self Care | Payer: Medicare Other | Source: Ambulatory Visit | Attending: Orthopaedic Surgery | Admitting: Orthopaedic Surgery

## 2015-06-08 ENCOUNTER — Encounter (HOSPITAL_COMMUNITY): Payer: Self-pay

## 2015-06-08 DIAGNOSIS — M7541 Impingement syndrome of right shoulder: Secondary | ICD-10-CM | POA: Insufficient documentation

## 2015-06-08 DIAGNOSIS — Z01812 Encounter for preprocedural laboratory examination: Secondary | ICD-10-CM | POA: Insufficient documentation

## 2015-06-08 HISTORY — DX: Pneumonia, unspecified organism: J18.9

## 2015-06-08 HISTORY — DX: Depression, unspecified: F32.A

## 2015-06-08 HISTORY — DX: Gastro-esophageal reflux disease without esophagitis: K21.9

## 2015-06-08 HISTORY — DX: Major depressive disorder, single episode, unspecified: F32.9

## 2015-06-08 LAB — BASIC METABOLIC PANEL
Anion gap: 13 (ref 5–15)
BUN: 19 mg/dL (ref 6–20)
CO2: 25 mmol/L (ref 22–32)
Calcium: 8.8 mg/dL — ABNORMAL LOW (ref 8.9–10.3)
Chloride: 97 mmol/L — ABNORMAL LOW (ref 101–111)
Creatinine, Ser: 0.89 mg/dL (ref 0.61–1.24)
GFR calc Af Amer: >60 mL/min (ref >60)
GFR calc non Af Amer: >60 mL/min (ref >60)
Glucose, Bld: 103 mg/dL — ABNORMAL HIGH (ref 65–99)
Potassium: 4.4 mmol/L (ref 3.5–5.1)
Sodium: 135 mmol/L (ref 135–145)

## 2015-06-08 LAB — CBC
HCT: 34.2 % — ABNORMAL LOW (ref 39.0–52.0)
Hemoglobin: 11.8 g/dL — ABNORMAL LOW (ref 13.0–17.0)
MCH: 35 pg — ABNORMAL HIGH (ref 26.0–34.0)
MCHC: 34.5 g/dL (ref 30.0–36.0)
MCV: 101.5 fL — ABNORMAL HIGH (ref 78.0–100.0)
Platelets: 170 10*3/uL (ref 150–400)
RBC: 3.37 MIL/uL — ABNORMAL LOW (ref 4.22–5.81)
RDW: 12.7 % (ref 11.5–15.5)
WBC: 5.5 10*3/uL (ref 4.0–10.5)

## 2015-06-14 MED ORDER — LACTATED RINGERS IV SOLN: INTRAVENOUS | Status: DC

## 2015-06-14 MED ORDER — CEFAZOLIN SODIUM-DEXTROSE 2-3 GM-% IV SOLR
2.0000 g | INTRAVENOUS | Status: AC
  Administered 2015-06-15: 2 g via INTRAVENOUS
  Filled 2015-06-14: qty 50

## 2015-06-15 ENCOUNTER — Encounter (HOSPITAL_COMMUNITY): Payer: Self-pay | Admitting: *Deleted

## 2015-06-15 ENCOUNTER — Ambulatory Visit (HOSPITAL_COMMUNITY)
Admission: RE | Admit: 2015-06-15 | Discharge: 2015-06-15 | Disposition: A | Discharge status: Home / Self Care | Payer: Medicare Other | Source: Ambulatory Visit | Attending: Orthopaedic Surgery | Admitting: Orthopaedic Surgery

## 2015-06-15 ENCOUNTER — Encounter (HOSPITAL_COMMUNITY)
Admission: RE | Disposition: A | Discharge status: Home / Self Care | Payer: Self-pay | Source: Ambulatory Visit | Attending: Orthopaedic Surgery

## 2015-06-15 ENCOUNTER — Ambulatory Visit (HOSPITAL_COMMUNITY): Payer: Medicare Other | Admitting: Anesthesiology

## 2015-06-15 DIAGNOSIS — M7541 Impingement syndrome of right shoulder: Secondary | ICD-10-CM | POA: Insufficient documentation

## 2015-06-15 DIAGNOSIS — M75111 Incomplete rotator cuff tear or rupture of right shoulder, not specified as traumatic: Secondary | ICD-10-CM | POA: Diagnosis not present

## 2015-06-15 HISTORY — PX: SHOULDER ARTHROSCOPY: SHX128

## 2015-06-15 SURGERY — ARTHROSCOPY, SHOULDER
Anesthesia: General | Site: Shoulder | Laterality: Right

## 2015-06-15 MED ORDER — ONDANSETRON HCL 4 MG/2ML IJ SOLN
INTRAMUSCULAR | Status: AC
  Filled 2015-06-15: qty 4

## 2015-06-15 MED ORDER — MIDAZOLAM HCL 2 MG/2ML IJ SOLN
INTRAMUSCULAR | Status: AC
  Filled 2015-06-15: qty 2

## 2015-06-15 MED ORDER — PHENYLEPHRINE HCL 10 MG/ML IJ SOLN
INTRAMUSCULAR | Status: AC
  Filled 2015-06-15: qty 1

## 2015-06-15 MED ORDER — MIDAZOLAM HCL 5 MG/5ML IJ SOLN
INTRAMUSCULAR | Status: DC | PRN
  Administered 2015-06-15 (×2): 1 mg via INTRAVENOUS

## 2015-06-15 MED ORDER — SUCCINYLCHOLINE CHLORIDE 20 MG/ML IJ SOLN
INTRAMUSCULAR | Status: AC
  Filled 2015-06-15: qty 1

## 2015-06-15 MED ORDER — FENTANYL CITRATE (PF) 250 MCG/5ML IJ SOLN
INTRAMUSCULAR | Status: AC
  Filled 2015-06-15: qty 5

## 2015-06-15 MED ORDER — SODIUM CHLORIDE 0.9 % IJ SOLN
INTRAMUSCULAR | Status: AC
  Filled 2015-06-15: qty 10

## 2015-06-15 MED ORDER — PROPOFOL 10 MG/ML IV BOLUS
INTRAVENOUS | Status: AC
  Filled 2015-06-15: qty 40

## 2015-06-15 MED ORDER — LIDOCAINE HCL (CARDIAC) 20 MG/ML IV SOLN
INTRAVENOUS | Status: AC
  Filled 2015-06-15: qty 10

## 2015-06-15 MED ORDER — PHENYLEPHRINE 40 MCG/ML (10ML) SYRINGE FOR IV PUSH (FOR BLOOD PRESSURE SUPPORT)
PREFILLED_SYRINGE | INTRAVENOUS | Status: AC
  Filled 2015-06-15: qty 20

## 2015-06-15 MED ORDER — PHENYLEPHRINE HCL 10 MG/ML IJ SOLN
10.0000 mg | INTRAMUSCULAR | Status: DC | PRN
  Administered 2015-06-15: 60 ug/min via INTRAVENOUS

## 2015-06-15 MED ORDER — EPHEDRINE SULFATE 50 MG/ML IJ SOLN
INTRAMUSCULAR | Status: AC
  Filled 2015-06-15: qty 1

## 2015-06-15 MED ORDER — ONDANSETRON HCL 4 MG/2ML IJ SOLN
INTRAMUSCULAR | Status: DC | PRN
  Administered 2015-06-15: 4 mg via INTRAVENOUS

## 2015-06-15 MED ORDER — LACTATED RINGERS IV SOLN
INTRAVENOUS | Status: DC | PRN
  Administered 2015-06-15: 07:00:00 via INTRAVENOUS

## 2015-06-15 MED ORDER — SODIUM CHLORIDE 0.9 % IR SOLN
Status: DC | PRN
  Administered 2015-06-15: 1000 mL
  Administered 2015-06-15: 3000 mL

## 2015-06-15 MED ORDER — PROMETHAZINE HCL 25 MG/ML IJ SOLN: 6.2500 mg | INTRAMUSCULAR | Status: DC | PRN

## 2015-06-15 MED ORDER — CHLORHEXIDINE GLUCONATE 4 % EX LIQD: 60.0000 mL | Freq: Once | CUTANEOUS | Status: DC

## 2015-06-15 MED ORDER — FENTANYL CITRATE (PF) 100 MCG/2ML IJ SOLN
INTRAMUSCULAR | Status: DC | PRN
  Administered 2015-06-15 (×3): 50 ug via INTRAVENOUS

## 2015-06-15 MED ORDER — PROPOFOL 10 MG/ML IV BOLUS
INTRAVENOUS | Status: DC | PRN
  Administered 2015-06-15: 100 mg via INTRAVENOUS

## 2015-06-15 MED ORDER — HYDROMORPHONE HCL 1 MG/ML IJ SOLN: 0.2500 mg | INTRAMUSCULAR | Status: DC | PRN

## 2015-06-15 MED ORDER — SUCCINYLCHOLINE CHLORIDE 20 MG/ML IJ SOLN
INTRAMUSCULAR | Status: DC | PRN
  Administered 2015-06-15: 100 mg via INTRAVENOUS

## 2015-06-15 MED ORDER — BUPIVACAINE HCL (PF) 0.5 % IJ SOLN
INTRAMUSCULAR | Status: DC | PRN
  Administered 2015-06-15: 20 mL via PERINEURAL

## 2015-06-15 MED ORDER — LIDOCAINE HCL (CARDIAC) 20 MG/ML IV SOLN
INTRAVENOUS | Status: DC | PRN
  Administered 2015-06-15: 60 mg via INTRAVENOUS

## 2015-06-15 MED ORDER — LIDOCAINE-EPINEPHRINE (PF) 1.5 %-1:200000 IJ SOLN
INTRAMUSCULAR | Status: DC | PRN
  Administered 2015-06-15: 10 mL via PERINEURAL

## 2015-06-15 MED ORDER — PHENYLEPHRINE HCL 10 MG/ML IJ SOLN
INTRAMUSCULAR | Status: DC | PRN
  Administered 2015-06-15 (×2): 80 ug via INTRAVENOUS
  Administered 2015-06-15: 40 ug via INTRAVENOUS
  Administered 2015-06-15: 80 ug via INTRAVENOUS

## 2015-06-15 MED ORDER — BUPIVACAINE HCL (PF) 0.25 % IJ SOLN
INTRAMUSCULAR | Status: AC
  Filled 2015-06-15: qty 30

## 2015-06-15 MED ORDER — ESMOLOL HCL 100 MG/10ML IV SOLN
INTRAVENOUS | Status: DC | PRN
  Administered 2015-06-15: 20 mg via INTRAVENOUS

## 2015-06-15 MED ORDER — ROCURONIUM BROMIDE 50 MG/5ML IV SOLN
INTRAVENOUS | Status: AC
  Filled 2015-06-15: qty 1

## 2015-06-15 MED ORDER — OXYCODONE HCL 5 MG PO TABS: 5.0000 mg | ORAL_TABLET | ORAL | Status: AC | PRN

## 2015-06-15 SURGICAL SUPPLY — 58 items
0.25% BUPIVACAINE 30ML IMPLANT
BLADE GREAT WHITE 4.2 (BLADE) ×2 IMPLANT
BLADE GREAT WHITE 4.2MM (BLADE) ×1
BUR VERTEX HOODED 4.5 (BURR) ×3 IMPLANT
CANNULA SHOULDER 7CM (CANNULA) ×3 IMPLANT
CANNULA TWIST IN 8.25X7CM (CANNULA) IMPLANT
COVER SURGICAL LIGHT HANDLE (MISCELLANEOUS) ×3 IMPLANT
DRAPE IMP U-DRAPE 54X76 (DRAPES) ×3 IMPLANT
DRAPE ORTHO SPLIT 77X108 STRL (DRAPES) ×4
DRAPE STERI 35X30 U-POUCH (DRAPES) ×3 IMPLANT
DRAPE SURG ORHT 6 SPLT 77X108 (DRAPES) ×2 IMPLANT
DRAPE U-SHAPE 47X51 STRL (DRAPES) ×3 IMPLANT
DRSG EMULSION OIL 3X3 NADH (GAUZE/BANDAGES/DRESSINGS) ×3 IMPLANT
DRSG PAD ABDOMINAL 8X10 ST (GAUZE/BANDAGES/DRESSINGS) ×6 IMPLANT
DURAPREP 26ML APPLICATOR (WOUND CARE) ×3 IMPLANT
ELECT MENISCUS 165MM 90D (ELECTRODE) IMPLANT
ELECT REM PT RETURN 9FT ADLT (ELECTROSURGICAL)
ELECTRODE REM PT RTRN 9FT ADLT (ELECTROSURGICAL) IMPLANT
GAUZE SPONGE 4X4 12PLY STRL (GAUZE/BANDAGES/DRESSINGS) ×3 IMPLANT
GLOVE BIO SURGEON STRL SZ 6.5 (GLOVE) ×2 IMPLANT
GLOVE BIO SURGEON STRL SZ8 (GLOVE) ×12 IMPLANT
GLOVE BIO SURGEONS STRL SZ 6.5 (GLOVE) ×1
GLOVE BIOGEL PI IND STRL 7.0 (GLOVE) ×1 IMPLANT
GLOVE BIOGEL PI IND STRL 8 (GLOVE) ×1 IMPLANT
GLOVE BIOGEL PI INDICATOR 7.0 (GLOVE) ×2
GLOVE BIOGEL PI INDICATOR 8 (GLOVE) ×2
GOWN STRL REUS W/ TWL LRG LVL3 (GOWN DISPOSABLE) ×1 IMPLANT
GOWN STRL REUS W/ TWL XL LVL3 (GOWN DISPOSABLE) ×2 IMPLANT
GOWN STRL REUS W/TWL 2XL LVL3 (GOWN DISPOSABLE) IMPLANT
GOWN STRL REUS W/TWL LRG LVL3 (GOWN DISPOSABLE) ×2
GOWN STRL REUS W/TWL XL LVL3 (GOWN DISPOSABLE) ×4
KIT BASIN OR (CUSTOM PROCEDURE TRAY) ×3 IMPLANT
KIT ROOM TURNOVER OR (KITS) ×3 IMPLANT
MANIFOLD NEPTUNE II (INSTRUMENTS) ×3 IMPLANT
NEEDLE 22X1 1/2 (OR ONLY) (NEEDLE) IMPLANT
NEEDLE SCORPION (NEEDLE) IMPLANT
NS IRRIG 1000ML POUR BTL (IV SOLUTION) ×3 IMPLANT
PACK ARTHROSCOPY DSU (CUSTOM PROCEDURE TRAY) ×3 IMPLANT
PACK UNIVERSAL I (CUSTOM PROCEDURE TRAY) ×3 IMPLANT
PAD ARMBOARD 7.5X6 YLW CONV (MISCELLANEOUS) ×6 IMPLANT
PENCIL BUTTON HOLSTER BLD 10FT (ELECTRODE) IMPLANT
SET ARTHROSCOPY TUBING (MISCELLANEOUS) ×2
SET ARTHROSCOPY TUBING LN (MISCELLANEOUS) ×1 IMPLANT
SLING ARM LRG ADULT FOAM STRAP (SOFTGOODS) ×3 IMPLANT
SPONGE GAUZE 4X4 12PLY STER LF (GAUZE/BANDAGES/DRESSINGS) ×3 IMPLANT
SPONGE LAP 4X18 X RAY DECT (DISPOSABLE) ×3 IMPLANT
SUT ETHILON 3 0 PS 1 (SUTURE) ×3 IMPLANT
SUT ETHILON 4 0 PS 2 18 (SUTURE) ×3 IMPLANT
SUT FIBERWIRE #2 38 T-5 BLUE (SUTURE)
SUTURE FIBERWR #2 38 T-5 BLUE (SUTURE) IMPLANT
SYR CONTROL 10ML LL (SYRINGE) IMPLANT
TAPE CLOTH SURG 6X10 WHT LF (GAUZE/BANDAGES/DRESSINGS) ×3 IMPLANT
TOWEL OR 17X24 6PK STRL BLUE (TOWEL DISPOSABLE) IMPLANT
TOWEL OR 17X26 10 PK STRL BLUE (TOWEL DISPOSABLE) ×3 IMPLANT
TUBE CONNECTING 12'X1/4 (SUCTIONS) ×1
TUBE CONNECTING 12X1/4 (SUCTIONS) ×2 IMPLANT
WAND HAND CNTRL MULTIVAC 90 (MISCELLANEOUS) ×3 IMPLANT
WATER STERILE IRR 1000ML POUR (IV SOLUTION) ×3 IMPLANT

## 2015-06-15 NOTE — Anesthesia Postprocedure Evaluation (Signed)
Anesthesia Post Note  Patient: [name]  Procedure(s) Performed: Procedure(s) (LRB): RIGHT SHOULDER ARTHROSCOPY, ACROMIOPLASTY, DEBRIDEMENT (Right)  Patient location during evaluation: PACU Anesthesia Type: General and Regional Level of consciousness: awake and alert Pain management: pain level controlled Vital Signs Assessment: post-procedure vital signs reviewed and stable Respiratory status: spontaneous breathing, nonlabored ventilation, respiratory function stable and patient connected to nasal cannula oxygen Cardiovascular status: blood pressure returned to baseline and stable Postop Assessment: no signs of nausea or vomiting Anesthetic complications: no    Last Vitals:  Filed Vitals:   06/15/15 0847 06/15/15 0849  BP:  129/72  Pulse: 98 96  Temp: 36.6 C   Resp: 11 17    Last Pain:  Filed Vitals:   06/15/15 0901  PainSc: 6                  [name] S

## 2015-06-15 NOTE — Addendum Note (Signed)
Addendum  created 06/15/15 1047 by Merdis Delay, CRNA   Modules edited: Anesthesia Flowsheet

## 2015-06-15 NOTE — Transfer of Care (Signed)
Immediate Anesthesia Transfer of Care Note  Patient: [name]  Procedure(s) Performed: Procedure(s): RIGHT SHOULDER ARTHROSCOPY, ACROMIOPLASTY, DEBRIDEMENT (Right)  Patient Location: PACU  Anesthesia Type:General  Level of Consciousness: awake, alert  and oriented  Airway & Oxygen Therapy: Patient Spontanous Breathing and Patient connected to nasal cannula oxygen  Post-op Assessment: Report given to RN and Post -op Vital signs reviewed and stable  Post vital signs: Reviewed and stable  Last Vitals:  Filed Vitals:   06/15/15 0605  BP: 132/72  Pulse: 92  Temp: 36.6 C  Resp: 20    Complications: No apparent anesthesia complications

## 2015-06-15 NOTE — Op Note (Signed)
#  [id number] 

## 2015-06-15 NOTE — Anesthesia Preprocedure Evaluation (Addendum)
Anesthesia Evaluation  Patient identified by MRN, date of birth, ID band Patient awake    Reviewed: Allergy & Precautions, NPO status , Patient's Chart, lab work & pertinent test results  Airway Mallampati: II  TM Distance: >3 FB Neck ROM: Full    Dental no notable dental hx. (+) Teeth Intact, Dental Advisory Given   Pulmonary neg pulmonary ROS,    Pulmonary exam normal breath sounds clear to auscultation       Cardiovascular negative cardio ROS Normal cardiovascular exam Rhythm:Regular Rate:Normal     Neuro/Psych negative neurological ROS  negative psych ROS   GI/Hepatic Neg liver ROS, GERD  Medicated,  Endo/Other  negative endocrine ROS  Renal/GU negative Renal ROS  negative genitourinary   Musculoskeletal negative musculoskeletal ROS (+)   Abdominal   Peds negative pediatric ROS (+)  Hematology negative hematology ROS (+)   Anesthesia Other Findings   Reproductive/Obstetrics negative OB ROS                            Anesthesia Physical Anesthesia Plan  ASA: II  Anesthesia Plan: General   Post-op Pain Management: GA combined w/ Regional for post-op pain   Induction: Intravenous  Airway Management Planned: Oral ETT  Additional Equipment:   Intra-op Plan:   Post-operative Plan: Extubation in OR  Informed Consent: I have reviewed the patients History and Physical, chart, labs and discussed the procedure including the risks, benefits and alternatives for the proposed anesthesia with the patient or authorized representative who has indicated his/her understanding and acceptance.   Dental advisory given  Plan Discussed with: CRNA and Surgeon  Anesthesia Plan Comments:         Anesthesia Quick Evaluation

## 2015-06-15 NOTE — Op Note (Signed)
NAMEREADE, GRATTON               ACCOUNT NO.:  [id number]  MEDICAL RECORD NO.:  [id number]  LOCATION:  [facility]                         FACILITY:  [facility]  PHYSICIAN:  Monico Blitz. Kaleeya Hancock, M.D.DATE OF BIRTH:  01-09-1946  DATE OF PROCEDURE:  06/15/2015 DATE OF DISCHARGE:                              OPERATIVE REPORT   PREOPERATIVE DIAGNOSES: 1. Right shoulder impingement. 2. Right shoulder partial rotator cuff tear.  POSTOPERATIVE DIAGNOSES: 1. Right shoulder impingement. 2. Right shoulder partial rotator cuff tear.  PROCEDURE: 1. Right shoulder arthroscopic acromioplasty. 2. Right shoulder arthroscopic debridement.  ANESTHESIA:  General and block.  ATTENDING SURGEON:  Monico Blitz. Rhona Raider, MD  ASSISTANT:  Loni Dolly, PA  INDICATION FOR PROCEDURE:  The patient is a 70 year old man who is very active.  He has long history of right shoulder pain.  He has persisted with difficulty despite injections and therapy.  By MRI scan, he had some irritation of his rotator cuff, but no complete tear.  His pain which limits his ability to rest and hunt and uses arm and he is offered an arthroscopy.  Informed operative consent was obtained after discussion of possible complications including reaction to anesthesia and infection.  SUMMARY OF FINDINGS AND PROCEDURE:  Under general anesthesia and a block, a right shoulder arthroscopy was performed.  Glenohumeral joint showed no degenerative changes in the biceps tendon and rotator cuff appeared benign from below.  In the subacromial space, he had some irritation of the rotator cuff and some bursitis, but no tear worthy of repair.  A debridement was done followed by an arthroscopic acromioplasty.  I also removed a portion in the undersurface of the clavicle which was prominent.  Shoulder was irrigated and he was discharged home same day.  DESCRIPTION OF PROCEDURE:  The patient was taken to operating suite where general anesthetic was applied  without difficulty.  He was also given a block in the pre-anesthesia area.  He was positioned in beach- chair position and prepped and draped in normal sterile fashion.  After the administration of preop IV Kefzol and an appropriate time out, an arthroscopy of the right shoulder was performed through total of 2 portals.  Findings were as noted above and procedure consisted predominantly of the acromioplasty which was done with a bur in the lateral position followed by transfer of the bur to the posterior position.  I also performed removal of the undersurface of the distal clavicle to further decompress the rotator cuff.  A thorough bursectomy and debridement was done, but no tear worthy of repair was found.  The shoulder was thoroughly irrigated, followed by reapproximation of portals loosely with nylon.  Adaptic was applied followed by dry gauze and tape.  Estimated blood loss and intraoperative fluids obtained from anesthesia records.  DISPOSITION:  The patient was extubated in the operating room and taken to recovery room in stable addition.  He was to go home same-day and follow up in the office in less than a week.  I will contact him by phone tonight.     Monico Blitz Rhona Raider, M.D.     PGD/MEDQ  D:  06/15/2015  T:  06/15/2015  Job:  [id number]

## 2015-06-15 NOTE — Interval H&P Note (Signed)
OK for surgery PD 

## 2015-06-15 NOTE — Anesthesia Procedure Notes (Addendum)
Anesthesia Regional Block:  Interscalene brachial plexus block  Pre-Anesthetic Checklist: ,, timeout performed, Correct Patient, Correct Site, Correct Laterality, Correct Procedure, Correct Position, site marked, Risks and benefits discussed,  Surgical consent,  Pre-op evaluation,  At surgeon's request and post-op pain management  Laterality: Right  Prep: chloraprep       Needles:  Injection technique: Single-shot  Needle Type: Echogenic Stimulator Needle     Needle Length: 9cm 9 cm Needle Gauge: 21 and 21 G    Additional Needles:  Procedures: ultrasound guided (picture in chart) Interscalene brachial plexus block Narrative:  Injection made incrementally with aspirations every 5 mL.  Performed by: Personally  Anesthesiologist: ROSE, Iona Beard  Additional Notes: Patient tolerated the procedure well without complications   Procedure Name: Intubation Date/Time: 06/15/2015 7:56 AM Performed by: Merdis Delay Pre-anesthesia Checklist: Patient identified, Emergency Drugs available, Suction available, Patient being monitored and Timeout performed Patient Re-evaluated:Patient Re-evaluated prior to inductionOxygen Delivery Method: Circle system utilized Preoxygenation: Pre-oxygenation with 100% oxygen Intubation Type: IV induction Ventilation: Mask ventilation without difficulty Laryngoscope Size: Mac and 4 Grade View: Grade III Tube type: Oral Tube size: 7.5 mm Number of attempts: 2 Airway Equipment and Method: Bougie stylet Placement Confirmation: ETT inserted through vocal cords under direct vision,  positive ETCO2,  CO2 detector and breath sounds checked- equal and bilateral Secured at: 22 cm Tube secured with: Tape Dental Injury: Teeth and Oropharynx as per pre-operative assessment  Difficulty Due To: Difficulty was unanticipated Future Recommendations: Recommend- induction with short-acting agent, and alternative techniques readily available Comments: Previous Grade  1 view with MAC 3??   DL x1 with MAC 4- grade 3 view by CRNA. DL again by Rose- arytenoids barely visible. Bougie utilized- +ETCO2.

## 2015-06-16 ENCOUNTER — Encounter (HOSPITAL_COMMUNITY): Payer: Self-pay | Admitting: Orthopaedic Surgery

## 2015-06-23 ENCOUNTER — Telehealth: Payer: Self-pay | Admitting: Internal Medicine

## 2015-06-23 NOTE — Telephone Encounter (Signed)
Patient Name: [name] DOB: [birth year] Initial Comment Caller stated he wants to set up an appointment for swollen legs and ankles. Nurse Assessment Nurse: Carlis Abbott, RN, Estill Bamberg Date/Time (Eastern Time): 06/23/2015 4:04:35 PM Confirm and document reason for call. If symptomatic, describe symptoms. You must click the next button to save text entered. ---Caller states that he is having bilateral leg, feet, and ankle swelling. It started 2 weeks ago. Denies fever or other symptoms. Has the patient traveled out of the country within the last 30 days? ---No Does the patient have any new or worsening symptoms? ---Yes Will a triage be completed? ---Yes Related visit to physician within the last 2 weeks? ---No Does the PT have any chronic conditions? (i.e. diabetes, asthma, etc.) ---No Is this a behavioral health or substance abuse call? ---No Guidelines Guideline Title Affirmed Question Affirmed Notes Leg Swelling and Edema [1] Red area or streak [2] large (> 2 in. or 5 cm) Final Disposition User See Physician within 4 Hours (or PCP triage) Carlis Abbott, RN, Estill Bamberg Comments Patient states that he has already been having this for this long and he wants to wait to be seen for it instead of going to an UC or ER. He request an appointment and says that he can't be seen on Friday due to it being his Rudene Anda. Unable to find an available acute appointment and then he wanted to see when Dr. Raliegh Ip could see him. Appointment made for him for Monday the 27th at 11am with Dr. Raliegh Ip Referrals REFERRED TO PCP OFFICE Disagree/Comply: Disagree Disagree/Comply Reason: Wait and see

## 2015-06-23 NOTE — Telephone Encounter (Signed)
FYI

## 2015-06-24 NOTE — Telephone Encounter (Signed)
Noted  

## 2015-06-28 ENCOUNTER — Encounter: Payer: Self-pay | Admitting: Internal Medicine

## 2015-06-28 ENCOUNTER — Ambulatory Visit (INDEPENDENT_AMBULATORY_CARE_PROVIDER_SITE_OTHER): Payer: Medicare Other | Admitting: Internal Medicine

## 2015-06-28 VITALS — BP 124/60 | HR 87 | Temp 97.8°F | Resp 20 | Ht 73.0 in | Wt 224.0 lb

## 2015-06-28 DIAGNOSIS — I872 Venous insufficiency (chronic) (peripheral): Secondary | ICD-10-CM

## 2015-06-28 DIAGNOSIS — Z0000 Encounter for general adult medical examination without abnormal findings: Secondary | ICD-10-CM | POA: Diagnosis not present

## 2015-06-28 MED ORDER — FUROSEMIDE 40 MG PO TABS: 40.0000 mg | ORAL_TABLET | Freq: Every day | ORAL | Status: DC

## 2015-06-28 NOTE — Progress Notes (Signed)
Pre visit review using our clinic review tool, if applicable. No additional management support is needed unless otherwise documented below in the visit note. 

## 2015-06-28 NOTE — Patient Instructions (Signed)
Limit your sodium (Salt) intake  Return in 6 months for follow-up  Venous Stasis or Chronic Venous Insufficiency Chronic venous insufficiency, also called venous stasis, is a condition that affects the veins in the legs. The condition prevents blood from being pumped through these veins effectively. Blood may no longer be pumped effectively from the legs back to the heart. This condition can range from mild to severe. With proper treatment, you should be able to continue with an active life. CAUSES  Chronic venous insufficiency occurs when the vein walls become stretched, weakened, or damaged or when valves within the vein are damaged. Some common causes of this include:  High blood pressure inside the veins (venous hypertension).  Increased blood pressure in the leg veins from long periods of sitting or standing.  A blood clot that blocks blood flow in a vein (deep vein thrombosis).  Inflammation of a superficial vein (phlebitis) that causes a blood clot to form. RISK FACTORS Various things can make you more likely to develop chronic venous insufficiency, including:  Family history of this condition.  Obesity.  Pregnancy.  Sedentary lifestyle.  Smoking.  Jobs requiring long periods of standing or sitting in one place.  Being a certain age. Women in their 15s and 21s and men in their 20s are more likely to develop this condition. SIGNS AND SYMPTOMS  Symptoms may include:   Varicose veins.  Skin breakdown or ulcers.  Reddened or discolored skin on the leg.  Brown, smooth, tight, and painful skin just above the ankle, usually on the inside surface (lipodermatosclerosis).  Swelling. DIAGNOSIS  To diagnose this condition, your health care provider will take a medical history and do a physical exam. The following tests may be ordered to confirm the diagnosis:  Duplex ultrasound--A procedure that produces a picture of a blood vessel and nearby organs and also provides  information on blood flow through the blood vessel.  Plethysmography--A procedure that tests blood flow.  A venogram, or venography--A procedure used to look at the veins using X-ray and dye. TREATMENT The goals of treatment are to help you return to an active life and to minimize pain or disability. Treatment will depend on the severity of the condition. Medical procedures may be needed for severe cases. Treatment options may include:   Use of compression stockings. These can help with symptoms and lower the chances of the problem getting worse, but they do not cure the problem.  Sclerotherapy--A procedure involving an injection of a material that "dissolves" the damaged veins. Other veins in the network of blood vessels take over the function of the damaged veins.  Surgery to remove the vein or cut off blood flow through the vein (vein stripping or laser ablation surgery).  Surgery to repair a valve. HOME CARE INSTRUCTIONS   Wear compression stockings as directed by your health care provider.  Only take over-the-counter or prescription medicines for pain, discomfort, or fever as directed by your health care provider.  Follow up with your health care provider as directed. SEEK MEDICAL CARE IF:   You have redness, swelling, or increasing pain in the affected area.  You see a red streak or line that extends up or down from the affected area.  You have a breakdown or loss of skin in the affected area, even if the breakdown is small.  You have an injury to the affected area. SEEK IMMEDIATE MEDICAL CARE IF:   You have an injury and open wound in the affected area.  Your pain is severe and does not improve with medicine.  You have sudden numbness or weakness in the foot or ankle below the affected area, or you have trouble moving your foot or ankle.  You have a fever or persistent symptoms for more than 2-3 days.  You have a fever and your symptoms suddenly get worse. MAKE SURE  YOU:   Understand these instructions.  Will watch your condition.  Will get help right away if you are not doing well or get worse.   This information is not intended to replace advice given to you by your health care provider. Make sure you discuss any questions you have with your health care provider.   Document Released: 07/24/2006 Document Revised: 01/08/2013 Document Reviewed: 11/25/2012 Elsevier Interactive Patient Education [employer].

## 2015-06-28 NOTE — Progress Notes (Signed)
Subjective:    Patient ID: [name], male    DOB: 1945/11/07, 70 y.o.   MRN: [id number]  HPI  Wt Readings from Last 3 Encounters:  06/28/15 224 lb (101.606 kg)  06/15/15 222 lb (100.699 kg)  06/08/15 222 lb 6.8 oz (100.69 kg)   70 year old patient who has history of chronic venous insufficiency and intermittent lower extremity edema.  This has worsened a bit over the past 2 weeks.  He has been on furosemide in the past, but has run out of this medication.  No shortness of breath, orthopnea.  He has history of gout which has been stable.  He has allergic rhinitis.  Past Medical History  Diagnosis Date  . ALLERGIC RHINITIS   . GOUT   . LOW BACK PAIN   . OSTEOARTHRITIS   . PULMONARY NODULE, LEFT LOWER LOBE   . TESTICULAR HYPOFUNCTION   . Pneumonia     Hx of  . Depression   . Urinary frequency   . GERD (gastroesophageal reflux disease)     Social History   Social History  . Marital Status: Married    Spouse Name: N/A  . Number of Children: N/A  . Years of Education: N/A   Occupational History  . Not on file.   Social History Main Topics  . Smoking status: Never Smoker   . Smokeless tobacco: Never Used  . Alcohol Use: 5.0 oz/week    10 Standard drinks or equivalent per week  . Drug Use: No  . Sexual Activity: Not on file   Other Topics Concern  . Not on file   Social History Narrative    Past Surgical History  Procedure Laterality Date  . Colostomy    . Knee surgery      3 on each knee  . Ankle surgery      4 times  . Elbow surgery Right     torn tendon  . Tonsillectomy and adenoidectomy    . Inguinal hernia repair      right  . Tumor excision      sebaceous cyst removed from chest  . Shoulder arthroscopy Right 06/15/2015    Procedure: RIGHT SHOULDER ARTHROSCOPY, ACROMIOPLASTY, DEBRIDEMENT;  Surgeon: Melrose Nakayama, MD;  Location: [facility];  Service: Orthopedics;  Laterality: Right;    No family history on file.  No Known Allergies  Current  Outpatient Prescriptions on File Prior to Visit  Medication Sig Dispense Refill  . allopurinol (ZYLOPRIM) 100 MG tablet Take 5 tablets (500 mg total) by mouth daily. (Patient taking differently: Take 100 mg by mouth 5 (five) times daily. ) 150 tablet 2  . aspirin 325 MG tablet Take 325 mg by mouth daily.      . Cetirizine HCl 10 MG TBDP Take 1 tablet by mouth daily.    . clobetasol ointment (TEMOVATE) [id number] % Apply 1 application topically daily.     . colchicine 0.6 MG tablet Take 0.6 mg by mouth 3 (three) times daily as needed. gout    . ergocalciferol (VITAMIN D2) 50000 units capsule Take 50,000 Units by mouth once a week. Monday    . finasteride (PROSCAR) 5 MG tablet Take 5 mg by mouth daily.    . fluticasone (FLONASE) 50 MCG/ACT nasal spray Place 2 sprays into both nostrils daily.    . indomethacin (INDOCIN) 50 MG capsule Take 1 capsule (50 mg total) by mouth 3 (three) times daily as needed. 90 capsule 1  . magnesium oxide (MAG-OX) 400  MG tablet Take 400 mg by mouth 2 (two) times daily.    . methocarbamol (ROBAXIN) 750 MG tablet Take 750 mg by mouth every 8 (eight) hours as needed.     Marland Kitchen morphine (MS CONTIN) 15 MG 12 hr tablet Take 15 mg by mouth every 12 (twelve) hours.    . Multiple Vitamin (MULTIVITAMIN) tablet Take 1 tablet by mouth daily.    Marland Kitchen omeprazole (PRILOSEC) 20 MG capsule Take 20 mg by mouth daily. Patient states he is taking 40 mg  11  . oxyCODONE (OXY IR/ROXICODONE) 5 MG immediate release tablet Take 1 tablet (5 mg total) by mouth every 4 (four) hours as needed for severe pain. In addition to current chronic pain medication 30 tablet 0  . Oxycodone HCl 10 MG TABS Take 1 tablet (10 mg total) by mouth every 4 (four) hours as needed (Prescribed by [place]). Takes 2 tablets to equal 10 mg every 4 hours as needed 60 tablet 0  . sildenafil (VIAGRA) 100 MG tablet Take 100 mg by mouth daily as needed for erectile dysfunction.    . tamsulosin (FLOMAX) 0.4 MG CAPS capsule Take 0.4 mg by mouth at  bedtime.    . vitamin B-12 (CYANOCOBALAMIN) 1000 MCG tablet Take 1,000 mcg by mouth daily.     No current facility-administered medications on file prior to visit.    BP 124/60 mmHg  Pulse 87  Temp(Src) 97.8 F (36.6 C) (Oral)  Resp 20  Ht 6\' 1"  (1.854 m)  Wt 224 lb (101.606 kg)  BMI 29.56 kg/m2  SpO2 98%     Review of Systems  Constitutional: Negative for fever, chills, appetite change and fatigue.  HENT: Negative for congestion, dental problem, ear pain, hearing loss, sore throat, tinnitus, trouble swallowing and voice change.   Eyes: Negative for pain, discharge and visual disturbance.  Respiratory: Negative for cough, chest tightness, wheezing and stridor.   Cardiovascular: Positive for leg swelling. Negative for chest pain and palpitations.  Gastrointestinal: Negative for nausea, vomiting, abdominal pain, diarrhea, constipation, blood in stool and abdominal distention.  Genitourinary: Negative for urgency, hematuria, flank pain, discharge, difficulty urinating and genital sores.  Musculoskeletal: Positive for back pain and arthralgias. Negative for myalgias, joint swelling, gait problem and neck stiffness.  Skin: Negative for rash.  Neurological: Negative for dizziness, syncope, speech difficulty, weakness, numbness and headaches.  Hematological: Negative for adenopathy. Does not bruise/bleed easily.  Psychiatric/Behavioral: Negative for behavioral problems and dysphoric mood. The patient is not nervous/anxious.        Objective:   Physical Exam  Constitutional: He is oriented to person, place, and time. He appears well-developed.  HENT:  Head: Normocephalic.  Right Ear: External ear normal.  Left Ear: External ear normal.  Eyes: Conjunctivae and EOM are normal.  Neck: Normal range of motion.  Cardiovascular: Normal rate and normal heart sounds.  Exam reveals no gallop.   Pulmonary/Chest: Breath sounds normal. He has no rales.  Abdominal: Bowel sounds are normal.    Musculoskeletal: Normal range of motion. He exhibits edema. He exhibits no tenderness.  Prominent lower extremity edema distal to the knees  Neurological: He is alert and oriented to person, place, and time.  Psychiatric: He has a normal mood and affect. His behavior is normal.          Assessment & Plan:    Chronic venous insufficiency with lower extremity edema.  Low-salt diet stressed.  Will resume furosemide.  Will continue with support hose.  Will elevate as  much as possible Osteoarthritis Chronic low back pain  Recheck 6 months or as needed

## 2015-06-29 LAB — HEPATITIS C ANTIBODY: HCV Ab: NEGATIVE

## 2015-07-13 ENCOUNTER — Encounter: Payer: Self-pay | Admitting: Gastroenterology

## 2015-07-26 ENCOUNTER — Telehealth: Payer: Self-pay | Admitting: Internal Medicine

## 2015-07-26 NOTE — Telephone Encounter (Signed)
Spoke to pt, told him Hepatitis C was negative. Pt verbalized understanding and asked if I would mail him a copy. Told pt okay will put in the mail. Pt verbalized understanding.Copy of lab put in the mail.

## 2015-07-26 NOTE — Telephone Encounter (Signed)
Pt would like blood work results °

## 2015-08-24 ENCOUNTER — Ambulatory Visit (INDEPENDENT_AMBULATORY_CARE_PROVIDER_SITE_OTHER): Payer: Medicare Other | Admitting: Internal Medicine

## 2015-08-24 ENCOUNTER — Encounter: Payer: Self-pay | Admitting: Internal Medicine

## 2015-08-24 VITALS — BP 146/80 | HR 82 | Temp 97.8°F | Resp 20 | Ht 73.0 in | Wt 220.0 lb

## 2015-08-24 DIAGNOSIS — I872 Venous insufficiency (chronic) (peripheral): Secondary | ICD-10-CM

## 2015-08-24 DIAGNOSIS — M150 Primary generalized (osteo)arthritis: Secondary | ICD-10-CM | POA: Diagnosis not present

## 2015-08-24 DIAGNOSIS — G8929 Other chronic pain: Secondary | ICD-10-CM | POA: Diagnosis not present

## 2015-08-24 DIAGNOSIS — M5440 Lumbago with sciatica, unspecified side: Secondary | ICD-10-CM

## 2015-08-24 DIAGNOSIS — M159 Polyosteoarthritis, unspecified: Secondary | ICD-10-CM

## 2015-08-24 MED ORDER — TRIAMCINOLONE ACETONIDE 0.1 % EX CREA: 1.0000 "application " | TOPICAL_CREAM | Freq: Two times a day (BID) | CUTANEOUS | Status: DC

## 2015-08-24 NOTE — Patient Instructions (Signed)
Limit your sodium (Salt) intake  Please check your blood pressure on a regular basis.  If it is consistently greater than 150/90, please make an office appointment.  Return in 6 months for follow-up   

## 2015-08-24 NOTE — Progress Notes (Signed)
Subjective:    Patient ID: [name], male    DOB: [birth year], 70 y.o.   MRN: [id number]  HPI 70 year old patient who is seen today in follow-up.  He is status post right shoulder surgery on March 14 and has done well.  He has 1 final rehabilitation session.  He has chronic lower extremity edema that has been stable.  He has developed an occasional rash involving his lower extremities that at times is quite pruritic.  He has chronic low back pain and is on chronic narcotics. He is followed at the [facility]. In the past.  At the [facility] in [place].  He has been followed by podiatry and dermatology and ENT. He is requesting referrals.  He will be leaving for [place] for the summer and wishes to consider referrals upon his return  Past Medical History  Diagnosis Date  . ALLERGIC RHINITIS   . GOUT   . LOW BACK PAIN   . OSTEOARTHRITIS   . PULMONARY NODULE, LEFT LOWER LOBE   . TESTICULAR HYPOFUNCTION   . Pneumonia     Hx of  . Depression   . Urinary frequency   . GERD (gastroesophageal reflux disease)      Social History   Social History  . Marital Status: Married    Spouse Name: N/A  . Number of Children: N/A  . Years of Education: N/A   Occupational History  . Not on file.   Social History Main Topics  . Smoking status: Never Smoker   . Smokeless tobacco: Never Used  . Alcohol Use: 5.0 oz/week    10 Standard drinks or equivalent per week  . Drug Use: No  . Sexual Activity: Not on file   Other Topics Concern  . Not on file   Social History Narrative    Past Surgical History  Procedure Laterality Date  . Colostomy    . Knee surgery      3 on each knee  . Ankle surgery      4 times  . Elbow surgery Right     torn tendon  . Tonsillectomy and adenoidectomy    . Inguinal hernia repair      right  . Tumor excision      sebaceous cyst removed from chest  . Shoulder arthroscopy Right 06/15/2015    Procedure: RIGHT SHOULDER ARTHROSCOPY, ACROMIOPLASTY,  DEBRIDEMENT;  Surgeon: Melrose Nakayama, MD;  Location: [facility];  Service: Orthopedics;  Laterality: Right;    No family history on file.  No Known Allergies  Current Outpatient Prescriptions on File Prior to Visit  Medication Sig Dispense Refill  . allopurinol (ZYLOPRIM) 100 MG tablet Take 5 tablets (500 mg total) by mouth daily. (Patient taking differently: Take 100 mg by mouth 5 (five) times daily. ) 150 tablet 2  . aspirin 325 MG tablet Take 325 mg by mouth daily.      . Cetirizine HCl 10 MG TBDP Take 1 tablet by mouth daily.    . clobetasol ointment (TEMOVATE) AB-123456789 % Apply 1 application topically daily.     . colchicine 0.6 MG tablet Take 0.6 mg by mouth 3 (three) times daily as needed. gout    . finasteride (PROSCAR) 5 MG tablet Take 5 mg by mouth daily.    . fluticasone (FLONASE) 50 MCG/ACT nasal spray Place 2 sprays into both nostrils daily.    . furosemide (LASIX) 40 MG tablet Take 1 tablet (40 mg total) by mouth daily. 90 tablet  3  . indomethacin (INDOCIN) 50 MG capsule Take 1 capsule (50 mg total) by mouth 3 (three) times daily as needed. 90 capsule 1  . magnesium oxide (MAG-OX) 400 MG tablet Take 400 mg by mouth 2 (two) times daily.    . methocarbamol (ROBAXIN) 750 MG tablet Take 750 mg by mouth every 8 (eight) hours as needed.     Marland Kitchen morphine (MS CONTIN) 15 MG 12 hr tablet Take 15 mg by mouth every 12 (twelve) hours.    . Multiple Vitamin (MULTIVITAMIN) tablet Take 1 tablet by mouth daily.    Marland Kitchen omeprazole (PRILOSEC) 20 MG capsule Take 20 mg by mouth daily. Patient states he is taking 40 mg  11  . oxyCODONE (OXY IR/ROXICODONE) 5 MG immediate release tablet Take 1 tablet (5 mg total) by mouth every 4 (four) hours as needed for severe pain. In addition to current chronic pain medication 30 tablet 0  . sildenafil (VIAGRA) 100 MG tablet Take 100 mg by mouth daily as needed for erectile dysfunction.    . tamsulosin (FLOMAX) 0.4 MG CAPS capsule Take 0.4 mg by mouth at bedtime.    . vitamin  B-12 (CYANOCOBALAMIN) 1000 MCG tablet Take 1,000 mcg by mouth daily.     No current facility-administered medications on file prior to visit.    BP 146/80 mmHg  Pulse 82  Temp(Src) 97.8 F (36.6 C) (Oral)  Resp 20  Ht 6\' 1"  (1.854 m)  Wt 220 lb (99.791 kg)  BMI 29.03 kg/m2  SpO2 98%      Review of Systems  Constitutional: Negative for fever, chills, appetite change and fatigue.  HENT: Negative for congestion, dental problem, ear pain, hearing loss, sore throat, tinnitus, trouble swallowing and voice change.   Eyes: Negative for pain, discharge and visual disturbance.  Respiratory: Negative for cough, chest tightness, wheezing and stridor.   Cardiovascular: Positive for leg swelling. Negative for chest pain and palpitations.  Gastrointestinal: Negative for nausea, vomiting, abdominal pain, diarrhea, constipation, blood in stool and abdominal distention.  Genitourinary: Negative for urgency, hematuria, flank pain, discharge, difficulty urinating and genital sores.  Musculoskeletal: Negative for myalgias, back pain, joint swelling, arthralgias, gait problem and neck stiffness.  Skin: Positive for rash.  Neurological: Negative for dizziness, syncope, speech difficulty, weakness, numbness and headaches.  Hematological: Negative for adenopathy. Does not bruise/bleed easily.  Psychiatric/Behavioral: Negative for behavioral problems and dysphoric mood. The patient is not nervous/anxious.        Objective:   Physical Exam  Constitutional: He is oriented to person, place, and time. He appears well-developed.  HENT:  Head: Normocephalic.  Right Ear: External ear normal.  Left Ear: External ear normal.  Eyes: Conjunctivae and EOM are normal.  Neck: Normal range of motion.  Cardiovascular: Normal rate and normal heart sounds.   Pulmonary/Chest: Breath sounds normal.  Abdominal: Bowel sounds are normal.  Musculoskeletal: Normal range of motion. He exhibits edema. He exhibits no  tenderness.  Plus 2 edema distal to knees  Neurological: He is alert and oriented to person, place, and time.  Skin: Rash noted.  Patchy areas of dry, flaky skin, most marked involving the medial lower leg  Psychiatric: He has a normal mood and affect. His behavior is normal.          Assessment & Plan:   Chronic venous insufficiency with lower extremity edema Mild patchy nonspecific dermatitis.  Will treat with triamcinolone Chronic low back pain  Medicines updated  Nyoka Cowden, MD

## 2015-08-24 NOTE — Progress Notes (Signed)
Pre visit review using our clinic review tool, if applicable. No additional management support is needed unless otherwise documented below in the visit note. 

## 2015-12-10 ENCOUNTER — Telehealth: Payer: Self-pay | Admitting: Internal Medicine

## 2015-12-10 NOTE — Telephone Encounter (Signed)
Good morning [name] and [name],  This patient is disputing the coding of the hep c antibody for DOS 06/28/2015 sent to [facility]. Medicare is not covering. I see that there is a preventative Dx code placed on this lab. Medicare does not cover preventative Dx.   Is there another Dx that we can place on this to send back to [facility]? As it stands, the patient is getting a bill in full.   Thanks so much

## 2015-12-10 NOTE — Telephone Encounter (Signed)
Dawn,  Can we correctly use Z72.89? If so, Ill call [facility] to change

## 2015-12-10 NOTE — Telephone Encounter (Signed)
Dustin, please use diagnosis code Z72.89. Thanks

## 2015-12-13 NOTE — Telephone Encounter (Signed)
Called [facility] and had DX code changed to Z72.89 from Z00.00. They are refiling the charge to his insurance. Called left message for him to call us back. Please conveythis information when he calls.

## 2015-12-13 NOTE — Telephone Encounter (Incomplete)
HI [name],   The dx Z72.89 would be the correct one for the screening, however, the reason for the order was not in the note, so I am not sure if this was for a screen or if there was a specific reason for ordering the test.  Hope this helps.

## 2015-12-31 ENCOUNTER — Ambulatory Visit: Payer: Medicare Other | Admitting: Internal Medicine

## 2016-01-21 ENCOUNTER — Other Ambulatory Visit: Payer: Medicare Other

## 2016-01-28 ENCOUNTER — Ambulatory Visit: Payer: Medicare Other | Admitting: Oncology

## 2016-02-29 ENCOUNTER — Other Ambulatory Visit: Payer: Self-pay | Admitting: Orthopaedic Surgery

## 2016-02-29 DIAGNOSIS — M25521 Pain in right elbow: Secondary | ICD-10-CM

## 2016-03-06 ENCOUNTER — Ambulatory Visit (INDEPENDENT_AMBULATORY_CARE_PROVIDER_SITE_OTHER): Payer: Medicare Other | Admitting: Internal Medicine

## 2016-03-06 ENCOUNTER — Encounter: Payer: Self-pay | Admitting: Internal Medicine

## 2016-03-06 VITALS — BP 150/58 | HR 92 | Temp 97.6°F | Ht 73.0 in | Wt 214.5 lb

## 2016-03-06 DIAGNOSIS — E538 Deficiency of other specified B group vitamins: Secondary | ICD-10-CM | POA: Diagnosis not present

## 2016-03-06 DIAGNOSIS — G8929 Other chronic pain: Secondary | ICD-10-CM

## 2016-03-06 DIAGNOSIS — D472 Monoclonal gammopathy: Secondary | ICD-10-CM

## 2016-03-06 DIAGNOSIS — F1190 Opioid use, unspecified, uncomplicated: Secondary | ICD-10-CM

## 2016-03-06 DIAGNOSIS — R5383 Other fatigue: Secondary | ICD-10-CM

## 2016-03-06 DIAGNOSIS — M159 Polyosteoarthritis, unspecified: Secondary | ICD-10-CM

## 2016-03-06 DIAGNOSIS — R531 Weakness: Secondary | ICD-10-CM | POA: Diagnosis not present

## 2016-03-06 DIAGNOSIS — M150 Primary generalized (osteo)arthritis: Secondary | ICD-10-CM

## 2016-03-06 DIAGNOSIS — M545 Low back pain: Secondary | ICD-10-CM

## 2016-03-06 NOTE — Progress Notes (Signed)
Subjective:    Patient ID: [name], male    DOB: 1945/12/05, 70 y.o.   MRN: [id number]  HPI  70 year old patient who is seen today for follow-up. He is also followed at the [facility] system with chronic low back pain and osteoarthritis. He is requesting a refill on his analgesics but was told that this must be prescribed by his physicians at the [facility] His chief complaint today is fatigue.  This has been a intermittent problem in the past.  Wt Readings from Last 3 Encounters:  03/06/16 214 lb 8 oz (97.3 kg)  08/24/15 220 lb (99.8 kg)  06/28/15 224 lb (101.6 kg)   He has been followed by orthopedics recently due to a tendon rupture involving the right arm He states that 70 weeks ago he had some vomiting, which has not reoccurred  Past Medical History:  Diagnosis Date  . ALLERGIC RHINITIS   . Depression   . GERD (gastroesophageal reflux disease)   . GOUT   . LOW BACK PAIN   . OSTEOARTHRITIS   . Pneumonia    Hx of  . PULMONARY NODULE, LEFT LOWER LOBE   . TESTICULAR HYPOFUNCTION   . Urinary frequency      Social History   Social History  . Marital status: Married    Spouse name: N/A  . Number of children: N/A  . Years of education: N/A   Occupational History  . Not on file.   Social History Main Topics  . Smoking status: Never Smoker  . Smokeless tobacco: Never Used  . Alcohol use 5.0 oz/week    10 Standard drinks or equivalent per week  . Drug use: No  . Sexual activity: Not on file   Other Topics Concern  . Not on file   Social History Narrative  . No narrative on file    Past Surgical History:  Procedure Laterality Date  . ANKLE SURGERY     4 times  . COLOSTOMY    . ELBOW SURGERY Right    torn tendon  . INGUINAL HERNIA REPAIR     right  . KNEE SURGERY     3 on each knee  . SHOULDER ARTHROSCOPY Right 06/15/2015   Procedure: RIGHT SHOULDER ARTHROSCOPY, ACROMIOPLASTY, DEBRIDEMENT;  Surgeon: Melrose Nakayama, MD;  Location: [facility];   Service: Orthopedics;  Laterality: Right;  . TONSILLECTOMY AND ADENOIDECTOMY    . TUMOR EXCISION     sebaceous cyst removed from chest    No family history on file.  No Known Allergies  Current Outpatient Prescriptions on File Prior to Visit  Medication Sig Dispense Refill  . allopurinol (ZYLOPRIM) 100 MG tablet Take 5 tablets (500 mg total) by mouth daily. (Patient taking differently: Take 100 mg by mouth 5 (five) times daily. ) 150 tablet 2  . aspirin 325 MG tablet Take 325 mg by mouth daily.      . Cetirizine HCl 10 MG TBDP Take 1 tablet by mouth daily.    . clobetasol ointment (TEMOVATE) [id number] % Apply 1 application topically daily.     . colchicine 0.6 MG tablet Take 0.6 mg by mouth 3 (three) times daily as needed. gout    . finasteride (PROSCAR) 5 MG tablet Take 5 mg by mouth daily.    . fluticasone (FLONASE) 50 MCG/ACT nasal spray Place 2 sprays into both nostrils daily.    . furosemide (LASIX) 40 MG tablet Take 1 tablet (40 mg total) by mouth daily. 90 tablet  3  . indomethacin (INDOCIN) 50 MG capsule Take 1 capsule (50 mg total) by mouth 3 (three) times daily as needed. 90 capsule 1  . magnesium oxide (MAG-OX) 400 MG tablet Take 400 mg by mouth 2 (two) times daily.    . methocarbamol (ROBAXIN) 750 MG tablet Take 750 mg by mouth every 8 (eight) hours as needed.     Marland Kitchen morphine (MS CONTIN) 15 MG 12 hr tablet Take 15 mg by mouth every 12 (twelve) hours.    . Multiple Vitamin (MULTIVITAMIN) tablet Take 1 tablet by mouth daily.    Marland Kitchen omeprazole (PRILOSEC) 20 MG capsule Take 20 mg by mouth daily. Patient states he is taking 40 mg  11  . oxyCODONE (OXY IR/ROXICODONE) 5 MG immediate release tablet Take 1 tablet (5 mg total) by mouth every 4 (four) hours as needed for severe pain. In addition to current chronic pain medication 30 tablet 0  . sildenafil (VIAGRA) 100 MG tablet Take 100 mg by mouth daily as needed for erectile dysfunction.    . tamsulosin (FLOMAX) 0.4 MG CAPS capsule Take 0.4 mg  by mouth at bedtime.    . triamcinolone cream (KENALOG) 0.1 % Apply 1 application topically 2 (two) times daily. 30 g 2  . vitamin B-12 (CYANOCOBALAMIN) 1000 MCG tablet Take 1,000 mcg by mouth daily.     No current facility-administered medications on file prior to visit.     BP (!) 150/58 (BP Location: Left Arm, Patient Position: Sitting, Cuff Size: Normal)   Pulse 92   Temp 97.6 F (36.4 C) (Oral)   Ht 6\' 1"  (1.854 m)   Wt 214 lb 8 oz (97.3 kg)   SpO2 97%   BMI 28.30 kg/m     Review of Systems  Constitutional: Positive for fatigue. Negative for appetite change, chills and fever.  HENT: Negative for congestion, dental problem, ear pain, hearing loss, sore throat, tinnitus, trouble swallowing and voice change.   Eyes: Negative for pain, discharge and visual disturbance.  Respiratory: Negative for cough, chest tightness, wheezing and stridor.   Cardiovascular: Negative for chest pain, palpitations and leg swelling.  Gastrointestinal: Negative for abdominal distention, abdominal pain, blood in stool, constipation, diarrhea, nausea and vomiting.  Genitourinary: Negative for difficulty urinating, discharge, flank pain, genital sores, hematuria and urgency.  Musculoskeletal: Positive for arthralgias and back pain. Negative for gait problem, joint swelling, myalgias and neck stiffness.  Skin: Negative for rash.  Neurological: Positive for weakness. Negative for dizziness, syncope, speech difficulty, numbness and headaches.  Hematological: Negative for adenopathy. Does not bruise/bleed easily.  Psychiatric/Behavioral: Negative for behavioral problems and dysphoric mood. The patient is not nervous/anxious.        Objective:   Physical Exam  Constitutional: He is oriented to person, place, and time. He appears well-developed and well-nourished. No distress.  Blood pressure 140/70  HENT:  Head: Normocephalic.  Right Ear: External ear normal.  Left Ear: External ear normal.  Eyes:  Conjunctivae and EOM are normal.  Neck: Normal range of motion.  Cardiovascular: Normal rate and normal heart sounds.   Pulmonary/Chest: Breath sounds normal.  Abdominal: Soft. Bowel sounds are normal.  Suggestion of enlargement of the left lobe of the liver   Musculoskeletal: Normal range of motion. He exhibits no edema or tenderness.  Neurological: He is alert and oriented to person, place, and time.  Skin:  Telangiectatic lesion over the lower anterior chest wall Status post partial mastectomy  Psychiatric: He has a normal mood and affect.  His behavior is normal.          Assessment & Plan:   Fatigue.  This has been a intermittent and chronic complaint.  Will check screening lab Chronic pain syndrome.  [facility].  Follow-up hematology next month as planned Possible hepatomegaly .we'll check LFTs  CPX in the spring as scheduled  Nyoka Cowden

## 2016-03-06 NOTE — Progress Notes (Signed)
Pre visit review using our clinic review tool, if applicable. No additional management support is needed unless otherwise documented below in the visit note. 

## 2016-03-06 NOTE — Patient Instructions (Signed)
Hematology follow-up as scheduled Orthopedic follow-up as scheduled  Return in the spring for your annual exam

## 2016-03-07 LAB — COMPREHENSIVE METABOLIC PANEL
ALT: 26 U/L (ref 0–53)
AST: 38 U/L — ABNORMAL HIGH (ref 0–37)
Albumin: 3.8 g/dL (ref 3.5–5.2)
Alkaline Phosphatase: 133 U/L — ABNORMAL HIGH (ref 39–117)
BUN: 16 mg/dL (ref 6–23)
CO2: 28 mEq/L (ref 19–32)
Calcium: 9 mg/dL (ref 8.4–10.5)
Chloride: 98 mEq/L (ref 96–112)
Creatinine, Ser: 0.73 mg/dL (ref 0.40–1.50)
GFR: 112.67 mL/min (ref >60.00)
Glucose, Bld: 106 mg/dL — ABNORMAL HIGH (ref 70–99)
Potassium: 4.2 mEq/L (ref 3.5–5.1)
Sodium: 138 mEq/L (ref 135–145)
Total Bilirubin: 0.6 mg/dL (ref 0.2–1.2)
Total Protein: 6.9 g/dL (ref 6.0–8.3)

## 2016-03-07 LAB — CBC WITH DIFFERENTIAL/PLATELET
Basophils Absolute: 0.1 10*3/uL (ref 0.0–0.1)
Basophils Relative: 1.6 % (ref 0.0–3.0)
Eosinophils Absolute: 0.1 10*3/uL (ref 0.0–0.7)
Eosinophils Relative: 1.1 % (ref 0.0–5.0)
HCT: 36.2 % — ABNORMAL LOW (ref 39.0–52.0)
Hemoglobin: 12.5 g/dL — ABNORMAL LOW (ref 13.0–17.0)
Lymphocytes Relative: 30 % (ref 12.0–46.0)
Lymphs Abs: 1.6 10*3/uL (ref 0.7–4.0)
MCHC: 34.6 g/dL (ref 30.0–36.0)
MCV: 100 fl (ref 78.0–100.0)
Monocytes Absolute: 0.5 10*3/uL (ref 0.1–1.0)
Monocytes Relative: 10.2 % (ref 3.0–12.0)
Neutro Abs: 3 10*3/uL (ref 1.4–7.7)
Neutrophils Relative %: 57.1 % (ref 43.0–77.0)
Platelets: 252 10*3/uL (ref 150.0–400.0)
RBC: 3.62 Mil/uL — ABNORMAL LOW (ref 4.22–5.81)
RDW: 13.9 % (ref 11.5–15.5)
WBC: 5.2 10*3/uL (ref 4.0–10.5)

## 2016-03-07 LAB — TSH: TSH: 1.12 u[IU]/mL (ref 0.35–4.50)

## 2016-03-10 ENCOUNTER — Ambulatory Visit
Admission: RE | Admit: 2016-03-10 | Discharge: 2016-03-10 | Disposition: A | Discharge status: Home / Self Care | Payer: Medicare Other | Source: Ambulatory Visit | Attending: Orthopaedic Surgery | Admitting: Orthopaedic Surgery

## 2016-03-10 DIAGNOSIS — M25521 Pain in right elbow: Secondary | ICD-10-CM

## 2016-03-29 NOTE — Telephone Encounter (Signed)
error 

## 2016-04-17 ENCOUNTER — Ambulatory Visit
Admission: RE | Admit: 2016-04-17 | Discharge: 2016-04-17 | Disposition: A | Discharge status: Home / Self Care | Payer: Medicare Other | Source: Ambulatory Visit | Attending: Orthopaedic Surgery | Admitting: Orthopaedic Surgery

## 2016-04-25 ENCOUNTER — Other Ambulatory Visit: Payer: Self-pay | Admitting: Orthopaedic Surgery

## 2016-04-25 DIAGNOSIS — M25511 Pain in right shoulder: Secondary | ICD-10-CM

## 2016-04-26 ENCOUNTER — Other Ambulatory Visit: Payer: Self-pay | Admitting: Otolaryngology

## 2016-04-26 DIAGNOSIS — K225 Diverticulum of esophagus, acquired: Secondary | ICD-10-CM

## 2016-05-01 ENCOUNTER — Ambulatory Visit
Admission: RE | Admit: 2016-05-01 | Discharge: 2016-05-01 | Disposition: A | Discharge status: Home / Self Care | Payer: Medicare Other | Source: Ambulatory Visit | Attending: Orthopaedic Surgery | Admitting: Orthopaedic Surgery

## 2016-05-01 DIAGNOSIS — M25511 Pain in right shoulder: Secondary | ICD-10-CM

## 2016-05-02 ENCOUNTER — Ambulatory Visit
Admission: RE | Admit: 2016-05-02 | Discharge: 2016-05-02 | Disposition: A | Discharge status: Home / Self Care | Payer: Medicare Other | Source: Ambulatory Visit | Attending: Otolaryngology | Admitting: Otolaryngology

## 2016-05-02 DIAGNOSIS — K225 Diverticulum of esophagus, acquired: Secondary | ICD-10-CM

## 2016-05-31 ENCOUNTER — Ambulatory Visit: Payer: Self-pay | Admitting: Otolaryngology

## 2016-05-31 NOTE — H&P (Signed)
  Since I saw him a couple of years ago his swallowing has slowly deteriorated. He has a hard time swallowing meat. This is very difficult for him as he enjoys eating steak. It takes a long time to eat and he has a lot of throat clearing and a lot of debris in his throat. He also [facility]iring [facility].  On exam, he appears healthy. His breathing is clear. Oral cavity and pharynx are unremarkable. Indirect exam inadequate due to excessive gag reflex. No palpable neck masses. Chest is clear. Heart regular.  History of Zenker's, recommend repeat barium swallow. Based on his symptoms, he is probably going to need surgical treatment now. We can do a hearing test today for [facility].     

## 2016-06-07 ENCOUNTER — Other Ambulatory Visit: Payer: Self-pay

## 2016-06-07 ENCOUNTER — Encounter (HOSPITAL_COMMUNITY)
Admission: RE | Admit: 2016-06-07 | Discharge: 2016-06-07 | Disposition: A | Discharge status: Home / Self Care | Payer: Medicare Other | Source: Ambulatory Visit | Attending: Otolaryngology | Admitting: Otolaryngology

## 2016-06-07 ENCOUNTER — Encounter (HOSPITAL_COMMUNITY): Payer: Self-pay

## 2016-06-07 DIAGNOSIS — Z01810 Encounter for preprocedural cardiovascular examination: Secondary | ICD-10-CM | POA: Diagnosis not present

## 2016-06-07 DIAGNOSIS — K225 Diverticulum of esophagus, acquired: Secondary | ICD-10-CM | POA: Insufficient documentation

## 2016-06-07 DIAGNOSIS — Z01812 Encounter for preprocedural laboratory examination: Secondary | ICD-10-CM | POA: Diagnosis not present

## 2016-06-07 HISTORY — DX: Dorsalgia, unspecified: M54.9

## 2016-06-07 HISTORY — DX: Localized edema: R60.0

## 2016-06-07 HISTORY — DX: Other specified postprocedural states: Z98.890

## 2016-06-07 HISTORY — DX: Effusion, unspecified joint: M25.40

## 2016-06-07 HISTORY — DX: Personal history of other infectious and parasitic diseases: Z86.19

## 2016-06-07 HISTORY — DX: Other muscle spasm: M62.838

## 2016-06-07 HISTORY — DX: Nocturia: R35.1

## 2016-06-07 HISTORY — DX: Personal history of other diseases of the respiratory system: Z87.09

## 2016-06-07 HISTORY — DX: Other seasonal allergic rhinitis: J30.2

## 2016-06-07 HISTORY — DX: Edema, unspecified: R60.9

## 2016-06-07 HISTORY — DX: Other chronic pain: G89.29

## 2016-06-07 HISTORY — DX: Pain in unspecified joint: M25.50

## 2016-06-07 LAB — CBC
HCT: 37.2 % — ABNORMAL LOW (ref 39.0–52.0)
Hemoglobin: 12.7 g/dL — ABNORMAL LOW (ref 13.0–17.0)
MCH: 34.1 pg — ABNORMAL HIGH (ref 26.0–34.0)
MCHC: 34.1 g/dL (ref 30.0–36.0)
MCV: 100 fL (ref 78.0–100.0)
Platelets: 181 10*3/uL (ref 150–400)
RBC: 3.72 MIL/uL — ABNORMAL LOW (ref 4.22–5.81)
RDW: 12.4 % (ref 11.5–15.5)
WBC: 5.1 10*3/uL (ref 4.0–10.5)

## 2016-06-07 LAB — BASIC METABOLIC PANEL WITH GFR
Anion gap: 9 (ref 5–15)
BUN: 5 mg/dL — ABNORMAL LOW (ref 6–20)
CO2: 30 mmol/L (ref 22–32)
Calcium: 9.2 mg/dL (ref 8.9–10.3)
Chloride: 99 mmol/L — ABNORMAL LOW (ref 101–111)
Creatinine, Ser: 0.84 mg/dL (ref 0.61–1.24)
GFR calc Af Amer: >60 mL/min
GFR calc non Af Amer: >60 mL/min
Glucose, Bld: 119 mg/dL — ABNORMAL HIGH (ref 65–99)
Potassium: 3.9 mmol/L (ref 3.5–5.1)
Sodium: 138 mmol/L (ref 135–145)

## 2016-06-07 NOTE — Progress Notes (Addendum)
Cardiologist denies  Medical Md is Dr.Peter Burnice Logan  Echo report in epic from 2008  Stress test done in 2008  Heart cath denies  EKG denies in past yr  CXR denies in  Past yr

## 2016-06-07 NOTE — Pre-Procedure Instructions (Signed)
[name]  06/07/2016      [facility], [address] [place] [address] Phone: [phone] Fax: [phone]    Your procedure is scheduled on Wed, Mar 14 @ 12:00 PM  Report to [facility] Admitting at 10:00 AM  Call this number if you have problems the morning of surgery:  [phone]   Remember:  Do not eat food or drink liquids after midnight.  Take these medicines the morning of surgery with A SIP OF WATER Allopurinol(Zyloprim),Zyrtec(Cetirizine-if needed),Colchicine(if needed),Flonase(Fluticasone-if needed),and Pain Pill(if needed)              Stop taking any Vitamins or Herbal Medications. No Goody's,BC's,Aleve,Advil,Motrin,ibuprofen,or Fish Oil.    Do not wear jewelry.  Do not wear lotions, powders,colognes , or deoderant.             Men may shave face and neck.  Do not bring valuables to the hospital.  [facility] is not responsible for any belongings or valuables.  Contacts, dentures or bridgework may not be worn into surgery.  Leave your suitcase in the car.  After surgery it may be brought to your room.  For patients admitted to the hospital, discharge time will be determined by your treatment team.  Patients discharged the day of surgery will not be allowed to drive home.    [facility]  Before surgery, you can play an important role.  Because skin is not sterile, your skin needs to be as free of germs as possible.  You can reduce the number of germs on you skin by washing with CHG (chlorahexidine gluconate) soap before surgery.  CHG is an antiseptic cleaner which kills germs and bonds with the skin to continue killing germs even after washing.  Please DO NOT use if you have an allergy to CHG or antibacterial soaps.  If your skin becomes reddened/irritated stop using the CHG and inform your nurse  when you arrive at Short Stay.  Do not shave (including legs and underarms) for at least 48 hours prior to the first CHG shower.  You may shave your face.  Please follow these instructions carefully:   1.  Shower with CHG Soap the night before surgery and the                                morning of Surgery.  2.  If you choose to wash your hair, wash your hair first as usual with your       normal shampoo.  3.  After you shampoo, rinse your hair and body thoroughly to remove the                      Shampoo.  4.  Use CHG as you would any other liquid soap.  You can apply chg directly       to the skin and wash gently with scrungie or a clean washcloth.  5.  Apply the CHG Soap to your body ONLY FROM THE NECK DOWN.        Do not use on open wounds or open sores.  Avoid contact with your eyes,       ears, mouth and genitals (private parts).  Wash genitals (private parts)  with your normal soap.  6.  Wash thoroughly, paying special attention to the area where your surgery        will be performed.  7.  Thoroughly rinse your body with warm water from the neck down.  8.  DO NOT shower/wash with your normal soap after using and rinsing off       the CHG Soap.  9.  Pat yourself dry with a clean towel.            10.  Wear clean pajamas.            11.  Place clean sheets on your bed the night of your first shower and do not        sleep with pets.  Day of Surgery  Do not apply any lotions/deoderants the morning of surgery.  Please wear clean clothes to the hospital/surgery center.   Please read over the following fact sheets that you were given. Pain Booklet, Coughing and Deep Breathing and Surgical Site Infection Prevention

## 2016-06-14 ENCOUNTER — Encounter (HOSPITAL_COMMUNITY)
Admission: RE | Disposition: A | Discharge status: Home / Self Care | Payer: Self-pay | Source: Ambulatory Visit | Attending: Otolaryngology

## 2016-06-14 ENCOUNTER — Encounter (HOSPITAL_COMMUNITY): Payer: Self-pay | Admitting: Urology

## 2016-06-14 ENCOUNTER — Inpatient Hospital Stay (HOSPITAL_COMMUNITY)
Admission: RE | Admit: 2016-06-14 | Discharge: 2016-06-20 | DRG: 392 | Disposition: A | Discharge status: Home / Self Care | Payer: Medicare Other | Source: Ambulatory Visit | Attending: Otolaryngology | Admitting: Otolaryngology

## 2016-06-14 ENCOUNTER — Ambulatory Visit (HOSPITAL_COMMUNITY): Payer: Medicare Other | Admitting: Anesthesiology

## 2016-06-14 DIAGNOSIS — R490 Dysphonia: Secondary | ICD-10-CM | POA: Diagnosis not present

## 2016-06-14 DIAGNOSIS — Y838 Other surgical procedures as the cause of abnormal reaction of the patient, or of later complication, without mention of misadventure at the time of the procedure: Secondary | ICD-10-CM | POA: Diagnosis not present

## 2016-06-14 DIAGNOSIS — Y92239 Unspecified place in hospital as the place of occurrence of the external cause: Secondary | ICD-10-CM | POA: Diagnosis not present

## 2016-06-14 DIAGNOSIS — K225 Diverticulum of esophagus, acquired: Principal | ICD-10-CM | POA: Diagnosis present

## 2016-06-14 DIAGNOSIS — I739 Peripheral vascular disease, unspecified: Secondary | ICD-10-CM | POA: Diagnosis present

## 2016-06-14 DIAGNOSIS — T8182XA Emphysema (subcutaneous) resulting from a procedure, initial encounter: Secondary | ICD-10-CM

## 2016-06-14 HISTORY — DX: Diverticulum of esophagus, acquired: K22.5

## 2016-06-14 HISTORY — PX: ZENKER'S DIVERTICULECTOMY ENDOSCOPIC: SHX5224

## 2016-06-14 SURGERY — ZENKER'S DIVERTICULECTOMY ENDOSCOPIC
Anesthesia: General | Site: Mouth

## 2016-06-14 MED ORDER — SILDENAFIL CITRATE 100 MG PO TABS: 100.0000 mg | ORAL_TABLET | Freq: Every day | ORAL | Status: DC | PRN

## 2016-06-14 MED ORDER — ONDANSETRON HCL 4 MG/2ML IJ SOLN
INTRAMUSCULAR | Status: AC
  Filled 2016-06-14: qty 2

## 2016-06-14 MED ORDER — DEXAMETHASONE SODIUM PHOSPHATE 10 MG/ML IJ SOLN
INTRAMUSCULAR | Status: DC | PRN
  Administered 2016-06-14: 10 mg via INTRAVENOUS

## 2016-06-14 MED ORDER — ROCURONIUM BROMIDE 100 MG/10ML IV SOLN
INTRAVENOUS | Status: DC | PRN
  Administered 2016-06-14: 20 mg via INTRAVENOUS
  Administered 2016-06-14: 50 mg via INTRAVENOUS
  Administered 2016-06-14: 20 mg via INTRAVENOUS
  Administered 2016-06-14: 10 mg via INTRAVENOUS
  Administered 2016-06-14: 20 mg via INTRAVENOUS

## 2016-06-14 MED ORDER — FENTANYL CITRATE (PF) 100 MCG/2ML IJ SOLN
INTRAMUSCULAR | Status: AC
  Filled 2016-06-14: qty 2

## 2016-06-14 MED ORDER — SCOPOLAMINE 1 MG/3DAYS TD PT72
1.0000 | MEDICATED_PATCH | Freq: Every day | TRANSDERMAL | Status: DC
  Administered 2016-06-14: 1.5 mg via TRANSDERMAL
  Filled 2016-06-14: qty 1

## 2016-06-14 MED ORDER — ONDANSETRON HCL 4 MG/2ML IJ SOLN
INTRAMUSCULAR | Status: DC | PRN
  Administered 2016-06-14: 4 mg via INTRAVENOUS

## 2016-06-14 MED ORDER — PROMETHAZINE HCL 25 MG RE SUPP: 25.0000 mg | Freq: Four times a day (QID) | RECTAL | Status: DC | PRN

## 2016-06-14 MED ORDER — PROPOFOL 10 MG/ML IV BOLUS
INTRAVENOUS | Status: AC
  Filled 2016-06-14: qty 20

## 2016-06-14 MED ORDER — FLUTICASONE PROPIONATE 50 MCG/ACT NA SUSP
2.0000 | Freq: Every day | NASAL | Status: DC | PRN
  Filled 2016-06-14: qty 16

## 2016-06-14 MED ORDER — LIDOCAINE HCL (CARDIAC) 20 MG/ML IV SOLN
INTRAVENOUS | Status: DC | PRN
  Administered 2016-06-14: 100 mg via INTRAVENOUS

## 2016-06-14 MED ORDER — FUROSEMIDE 40 MG PO TABS: 40.0000 mg | ORAL_TABLET | Freq: Every day | ORAL | Status: DC | PRN

## 2016-06-14 MED ORDER — COLCHICINE 0.6 MG PO TABS: 0.6000 mg | ORAL_TABLET | Freq: Three times a day (TID) | ORAL | Status: DC | PRN

## 2016-06-14 MED ORDER — VITAMIN B-12 1000 MCG PO TABS: 2000.0000 ug | ORAL_TABLET | Freq: Every day | ORAL | Status: DC

## 2016-06-14 MED ORDER — ROCURONIUM BROMIDE 50 MG/5ML IV SOSY
PREFILLED_SYRINGE | INTRAVENOUS | Status: AC
  Filled 2016-06-14: qty 15

## 2016-06-14 MED ORDER — PROMETHAZINE HCL 25 MG/ML IJ SOLN: 6.2500 mg | INTRAMUSCULAR | Status: DC | PRN

## 2016-06-14 MED ORDER — IBUPROFEN 600 MG PO TABS
600.0000 mg | ORAL_TABLET | Freq: Three times a day (TID) | ORAL | Status: DC | PRN
  Filled 2016-06-14: qty 1

## 2016-06-14 MED ORDER — ADULT MULTIVITAMIN W/MINERALS CH: ORAL_TABLET | Freq: Every day | ORAL | Status: DC

## 2016-06-14 MED ORDER — FENTANYL CITRATE (PF) 100 MCG/2ML IJ SOLN
INTRAMUSCULAR | Status: DC | PRN
  Administered 2016-06-14 (×5): 50 ug via INTRAVENOUS

## 2016-06-14 MED ORDER — SUGAMMADEX SODIUM 200 MG/2ML IV SOLN
INTRAVENOUS | Status: DC | PRN
  Administered 2016-06-14: 200 mg via INTRAVENOUS

## 2016-06-14 MED ORDER — ASPIRIN 325 MG PO TABS: 325.0000 mg | ORAL_TABLET | Freq: Every day | ORAL | Status: DC

## 2016-06-14 MED ORDER — HYDROMORPHONE HCL 1 MG/ML IJ SOLN
0.2500 mg | INTRAMUSCULAR | Status: DC | PRN
  Administered 2016-06-14 (×2): 0.5 mg via INTRAVENOUS

## 2016-06-14 MED ORDER — LACTATED RINGERS IV SOLN
INTRAVENOUS | Status: DC
  Administered 2016-06-14 (×2): via INTRAVENOUS

## 2016-06-14 MED ORDER — HYDROCODONE-ACETAMINOPHEN 7.5-325 MG/15ML PO SOLN
10.0000 mL | ORAL | Status: DC | PRN
  Administered 2016-06-14: 15 mL via ORAL
  Filled 2016-06-14: qty 15

## 2016-06-14 MED ORDER — OXYCODONE HCL 5 MG PO TABS
10.0000 mg | ORAL_TABLET | ORAL | Status: DC | PRN
  Administered 2016-06-14 – 2016-06-15 (×3): 10 mg via ORAL
  Filled 2016-06-14 (×3): qty 2

## 2016-06-14 MED ORDER — DEXTROSE-NACL 5-0.9 % IV SOLN
INTRAVENOUS | Status: DC
  Administered 2016-06-14 – 2016-06-20 (×9): via INTRAVENOUS

## 2016-06-14 MED ORDER — PROMETHAZINE HCL 25 MG PO TABS
25.0000 mg | ORAL_TABLET | Freq: Four times a day (QID) | ORAL | Status: DC | PRN
  Administered 2016-06-14: 25 mg via ORAL
  Filled 2016-06-14: qty 1

## 2016-06-14 MED ORDER — SUGAMMADEX SODIUM 200 MG/2ML IV SOLN
INTRAVENOUS | Status: AC
  Filled 2016-06-14: qty 2

## 2016-06-14 MED ORDER — HYDROMORPHONE HCL 1 MG/ML IJ SOLN
INTRAMUSCULAR | Status: AC
  Filled 2016-06-14: qty 0.5

## 2016-06-14 MED ORDER — CALCIUM CARBONATE 1500 (600 CA) MG PO TABS
600.0000 mg | ORAL_TABLET | Freq: Two times a day (BID) | ORAL | Status: DC
  Filled 2016-06-14: qty 1

## 2016-06-14 MED ORDER — OXYCODONE HCL 5 MG PO TABS
5.0000 mg | ORAL_TABLET | ORAL | Status: DC | PRN
  Administered 2016-06-14: 5 mg via ORAL

## 2016-06-14 MED ORDER — HYDROMORPHONE HCL 1 MG/ML IJ SOLN
INTRAMUSCULAR | Status: AC
  Administered 2016-06-14: 0.5 mg via INTRAVENOUS
  Filled 2016-06-14: qty 0.5

## 2016-06-14 MED ORDER — 0.9 % SODIUM CHLORIDE (POUR BTL) OPTIME
TOPICAL | Status: DC | PRN
  Administered 2016-06-14: 1000 mL

## 2016-06-14 MED ORDER — MIDAZOLAM HCL 5 MG/5ML IJ SOLN
INTRAMUSCULAR | Status: DC | PRN
  Administered 2016-06-14: 2 mg via INTRAVENOUS

## 2016-06-14 MED ORDER — INDOMETHACIN 25 MG PO CAPS: 50.0000 mg | ORAL_CAPSULE | Freq: Three times a day (TID) | ORAL | Status: DC | PRN

## 2016-06-14 MED ORDER — MAGNESIUM OXIDE 400 (241.3 MG) MG PO TABS
400.0000 mg | ORAL_TABLET | Freq: Two times a day (BID) | ORAL | Status: DC
  Filled 2016-06-14: qty 1

## 2016-06-14 MED ORDER — METHOCARBAMOL 750 MG PO TABS
750.0000 mg | ORAL_TABLET | Freq: Three times a day (TID) | ORAL | Status: DC | PRN
  Administered 2016-06-14: 750 mg via ORAL
  Filled 2016-06-14: qty 1

## 2016-06-14 MED ORDER — PROPOFOL 10 MG/ML IV BOLUS
INTRAVENOUS | Status: DC | PRN
  Administered 2016-06-14: 50 mg via INTRAVENOUS
  Administered 2016-06-14: 200 mg via INTRAVENOUS
  Administered 2016-06-14: 50 mg via INTRAVENOUS

## 2016-06-14 MED ORDER — ESMOLOL HCL 100 MG/10ML IV SOLN
INTRAVENOUS | Status: DC | PRN
  Administered 2016-06-14: 10 mg via INTRAVENOUS

## 2016-06-14 MED ORDER — EPINEPHRINE HCL (NASAL) 0.1 % NA SOLN
NASAL | Status: AC
  Filled 2016-06-14: qty 30

## 2016-06-14 MED ORDER — MIDAZOLAM HCL 2 MG/2ML IJ SOLN
INTRAMUSCULAR | Status: AC
  Filled 2016-06-14: qty 2

## 2016-06-14 MED ORDER — ALLOPURINOL 100 MG PO TABS
300.0000 mg | ORAL_TABLET | Freq: Every day | ORAL | Status: DC
Start: 2016-06-15 — End: 2016-06-15

## 2016-06-14 MED ORDER — OXYCODONE HCL 5 MG PO TABS
ORAL_TABLET | ORAL | Status: AC
  Filled 2016-06-14: qty 1

## 2016-06-14 MED ORDER — EPINEPHRINE HCL (NASAL) 0.1 % NA SOLN
NASAL | Status: DC | PRN
  Administered 2016-06-14: 30 mL via TOPICAL

## 2016-06-14 MED ORDER — MORPHINE SULFATE ER 15 MG PO TBCR
15.0000 mg | EXTENDED_RELEASE_TABLET | Freq: Two times a day (BID) | ORAL | Status: DC
  Administered 2016-06-14: 15 mg via ORAL
  Filled 2016-06-14: qty 1

## 2016-06-14 MED ORDER — DEXAMETHASONE SODIUM PHOSPHATE 10 MG/ML IJ SOLN
INTRAMUSCULAR | Status: AC
  Filled 2016-06-14: qty 1

## 2016-06-14 MED ORDER — PHENOL 1.4 % MT LIQD
1.0000 | OROMUCOSAL | Status: DC | PRN
  Administered 2016-06-14: 1 via OROMUCOSAL
  Filled 2016-06-14: qty 177

## 2016-06-14 SURGICAL SUPPLY — 25 items
CATH ROBINSON RED A/P 18FR (CATHETERS) IMPLANT
COVER BACK TABLE 60X90IN (DRAPES) ×3 IMPLANT
COVER MAYO STAND STRL (DRAPES) ×3 IMPLANT
COVER SURGICAL LIGHT HANDLE (MISCELLANEOUS) ×3 IMPLANT
CUTTER FLEX LINEAR 45M (STAPLE) ×3 IMPLANT
DRAPE PROXIMA HALF (DRAPES) IMPLANT
GAUZE SPONGE 4X4 12PLY STRL (GAUZE/BANDAGES/DRESSINGS) ×3 IMPLANT
GLOVE BIO SURGEON STRL SZ8 (GLOVE) ×3 IMPLANT
GLOVE BIOGEL PI IND STRL 8 (GLOVE) IMPLANT
GLOVE BIOGEL PI INDICATOR 8 (GLOVE)
GLOVE ECLIPSE 7.5 STRL STRAW (GLOVE) ×3 IMPLANT
GOWN STRL REUS W/ TWL XL LVL3 (GOWN DISPOSABLE) ×1 IMPLANT
GOWN STRL REUS W/TWL XL LVL3 (GOWN DISPOSABLE) ×2
GUARD TEETH (MISCELLANEOUS) ×6 IMPLANT
KIT ROOM TURNOVER OR (KITS) ×3 IMPLANT
NS IRRIG 1000ML POUR BTL (IV SOLUTION) ×3 IMPLANT
PAD ARMBOARD 7.5X6 YLW CONV (MISCELLANEOUS) ×6 IMPLANT
PATTIES SURGICAL .5 X3 (DISPOSABLE) ×3 IMPLANT
RELOAD STAPLE TA45 3.5 REG BLU (ENDOMECHANICALS) ×3 IMPLANT
SOLUTION ANTI FOG 6CC (MISCELLANEOUS) ×3 IMPLANT
STAPLE RELOAD 2.5MM WHITE (STAPLE) IMPLANT
SURGILUBE 2OZ TUBE FLIPTOP (MISCELLANEOUS) IMPLANT
TOWEL OR 17X24 6PK STRL BLUE (TOWEL DISPOSABLE) ×3 IMPLANT
TUBE CONNECTING 12'X1/4 (SUCTIONS) ×1
TUBE CONNECTING 12X1/4 (SUCTIONS) ×2 IMPLANT

## 2016-06-14 NOTE — Progress Notes (Signed)
Pt admitted to unit, already requesting pain medicine. Medicated for pain with 5mg  oxycodone  before transferring to unit. Pt says this is nothing and does not help his chronic back pain at all

## 2016-06-14 NOTE — Anesthesia Postprocedure Evaluation (Addendum)
Anesthesia Post Note  Patient: [name]  Procedure(s) Performed: Procedure(s) (LRB): ZENKER'S DIVERTICULECTOMY ENDOSCOPIC (N/A)  Patient location during evaluation: PACU Anesthesia Type: General Level of consciousness: sedated Pain management: pain level controlled Vital Signs Assessment: post-procedure vital signs reviewed and stable Respiratory status: spontaneous breathing and respiratory function stable Cardiovascular status: stable Anesthetic complications: no       Last Vitals:  Vitals:   06/14/16 1445 06/14/16 1500  BP: 122/68   Pulse: 94 90  Resp: 14 13  Temp:      Last Pain:  Vitals:   06/14/16 1500  TempSrc:   PainSc: 7                  Cote Mayabb DANIEL

## 2016-06-14 NOTE — Progress Notes (Signed)
Verbal order given by on call MD  for 10mg  po oxycodone every 4 hrs for pain per pt's home schedule. Medicine administered as ordered as pt is very impatient and anxious to get his medicines instantly

## 2016-06-14 NOTE — Transfer of Care (Signed)
Immediate Anesthesia Transfer of Care Note  Patient: [name]  Procedure(s) Performed: Procedure(s): ZENKER'S DIVERTICULECTOMY ENDOSCOPIC (N/A)  Patient Location: PACU  Anesthesia Type:General  Level of Consciousness: awake, alert  and oriented  Airway & Oxygen Therapy: Patient Spontanous Breathing and Patient connected to nasal cannula oxygen  Post-op Assessment: Report given to RN, Post -op Vital signs reviewed and stable and Patient moving all extremities X 4  Post vital signs: Reviewed and stable  Last Vitals:  Vitals:   06/14/16 1012 06/14/16 1415  BP: (!) 147/73 121/77  Pulse: 98 94  Resp: 18 10  Temp: 36.7 C     Last Pain:  Vitals:   06/14/16 1021  TempSrc:   PainSc: 5          Complications: No apparent anesthesia complications

## 2016-06-14 NOTE — Op Note (Signed)
OPERATIVE REPORT  DATE OF SURGERY: 06/14/2016  PATIENT:  [name],  71 y.o. male  PRE-OPERATIVE DIAGNOSIS:  ZENKER DIVERTICULUM  POST-OPERATIVE DIAGNOSIS:  ZENKER DIVERTICULUM  PROCEDURE:  Procedure(s): ZENKER'S DIVERTICULECTOMY ENDOSCOPIC  SURGEON:  Beckie Salts, MD  ASSISTANTS: none  ANESTHESIA:   General   EBL:  20 ml  DRAINS: None  LOCAL MEDICATIONS USED:  None  SPECIMEN:  none  COUNTS:  Correct  PROCEDURE DETAILS: The patient was taken to the operating room and placed on the operating table in the supine position. Following induction of general endotracheal anesthesia, the table was turned 90 and the patient was draped in a standard fashion for endoscopy. A maxillary and mandibular tooth protector were used throughout the case. Initially an anterior commissure scope was used to orient to the laryngeal anatomy. Then a cervical rigid esophagoscope was used to identify the diverticulum, the esophageal lumen and the cricopharyngeal muscle. Marking the distance was then used to mark on the [facility] scope which was then entered and used to visualize the area of the diverticulum and the cricopharyngeus. The upper blade was passed into the esophagus and the lower blade into the diverticulum. The diverticulum was very shallow. The muscle was very thickened. There were no neoplasms identified. A naso-gastric tube was used to confirm the positioning of the esophagus. The nasogastric tube seemed to stop at the GE junction. The endoscopic GIA stapler was then used with a long blade in the esophagus and the short blade in the pouch. A single cut was created. There is small amount of bleeding that was controlled using topical adrenaline on pledgets. After that I was able to pass the esophagoscope used her into the esophagus. The scope was removed. Patient was awakened extubated and transferred to recovery in stable condition.    PATIENT DISPOSITION:  To PACU, stable

## 2016-06-14 NOTE — Interval H&P Note (Signed)
History and Physical Interval Note:  06/14/2016 11:52 AM  [name]  has presented today for surgery, with the diagnosis of ZENKER DIVERTICULUM  The various methods of treatment have been discussed with the patient and family. After consideration of risks, benefits and other options for treatment, the patient has consented to  Procedure(s): ZENKER'S DIVERTICULECTOMY ENDOSCOPIC (N/A) as a surgical intervention .  The patient's history has been reviewed, patient examined, no change in status, stable for surgery.  I have reviewed the patient's chart and labs.  Questions were answered to the patient's satisfaction.     [name]

## 2016-06-14 NOTE — H&P (View-Only) (Signed)
  Since I saw him a couple of years ago his swallowing has slowly deteriorated. He has a hard time swallowing meat. This is very difficult for him as he enjoys eating steak. It takes a long time to eat and he has a lot of throat clearing and a lot of debris in his throat. He also inquiring about hearing test for the [place].  On exam, he appears healthy. His breathing is clear. Oral cavity and pharynx are unremarkable. Indirect exam inadequate due to excessive gag reflex. No palpable neck masses. Chest is clear. Heart regular.  History of Zenker's, recommend repeat barium swallow. Based on his symptoms, he is probably going to need surgical treatment now. We can do a hearing test today for him to bring to the [place].

## 2016-06-14 NOTE — Progress Notes (Signed)
Pt requested chloraseptic spray for a sore throat.MD called and order for medication given and administered

## 2016-06-14 NOTE — Anesthesia Preprocedure Evaluation (Addendum)
Anesthesia Evaluation  Patient identified by MRN, date of birth, ID band Patient awake    Reviewed: Allergy & Precautions, NPO status , Patient's Chart, lab work & pertinent test results  History of Anesthesia Complications Negative for: history of anesthetic complications  Airway Mallampati: II  TM Distance: >3 FB Neck ROM: Full    Dental no notable dental hx. (+) Teeth Intact, Dental Advisory Given   Pulmonary neg pulmonary ROS,    Pulmonary exam normal breath sounds clear to auscultation       Cardiovascular + Peripheral Vascular Disease  negative cardio ROS Normal cardiovascular exam Rhythm:Regular Rate:Normal     Neuro/Psych PSYCHIATRIC DISORDERS Depression negative neurological ROS     GI/Hepatic Neg liver ROS, GERD  Controlled,  Endo/Other  negative endocrine ROS  Renal/GU negative Renal ROS  negative genitourinary   Musculoskeletal negative musculoskeletal ROS (+)   Abdominal   Peds negative pediatric ROS (+)  Hematology negative hematology ROS (+)   Anesthesia Other Findings   Reproductive/Obstetrics negative OB ROS                            Anesthesia Physical  Anesthesia Plan  ASA: II  Anesthesia Plan: General   Post-op Pain Management:    Induction: Intravenous  Airway Management Planned: Oral ETT  Additional Equipment:   Intra-op Plan:   Post-operative Plan: Extubation in OR  Informed Consent: I have reviewed the patients History and Physical, chart, labs and discussed the procedure including the risks, benefits and alternatives for the proposed anesthesia with the patient or authorized representative who has indicated his/her understanding and acceptance.   Dental advisory given  Plan Discussed with: CRNA, Surgeon and Anesthesiologist  Anesthesia Plan Comments:         Anesthesia Quick Evaluation

## 2016-06-14 NOTE — Anesthesia Procedure Notes (Signed)
Procedure Name: Intubation Date/Time: 06/14/2016 12:21 PM Performed by: Rush Farmer E Pre-anesthesia Checklist: Patient identified, Emergency Drugs available, Suction available and Patient being monitored Patient Re-evaluated:Patient Re-evaluated prior to inductionOxygen Delivery Method: Circle system utilized Preoxygenation: Pre-oxygenation with 100% oxygen Intubation Type: IV induction Ventilation: Mask ventilation without difficulty Laryngoscope Size: Mac and 4 Grade View: Grade II Tube type: Oral Tube size: 7.5 mm Number of attempts: 1 Airway Equipment and Method: Stylet Placement Confirmation: ETT inserted through vocal cords under direct vision,  positive ETCO2 and breath sounds checked- equal and bilateral Secured at: 22 cm Tube secured with: Tape Dental Injury: Teeth and Oropharynx as per pre-operative assessment

## 2016-06-15 ENCOUNTER — Observation Stay (HOSPITAL_COMMUNITY): Payer: Medicare Other

## 2016-06-15 ENCOUNTER — Encounter (HOSPITAL_COMMUNITY): Payer: Self-pay | Admitting: Otolaryngology

## 2016-06-15 DIAGNOSIS — T8182XA Emphysema (subcutaneous) resulting from a procedure, initial encounter: Secondary | ICD-10-CM | POA: Diagnosis not present

## 2016-06-15 DIAGNOSIS — I739 Peripheral vascular disease, unspecified: Secondary | ICD-10-CM | POA: Diagnosis present

## 2016-06-15 DIAGNOSIS — K225 Diverticulum of esophagus, acquired: Secondary | ICD-10-CM | POA: Diagnosis present

## 2016-06-15 DIAGNOSIS — Y838 Other surgical procedures as the cause of abnormal reaction of the patient, or of later complication, without mention of misadventure at the time of the procedure: Secondary | ICD-10-CM | POA: Diagnosis not present

## 2016-06-15 DIAGNOSIS — R490 Dysphonia: Secondary | ICD-10-CM | POA: Diagnosis not present

## 2016-06-15 DIAGNOSIS — Y92239 Unspecified place in hospital as the place of occurrence of the external cause: Secondary | ICD-10-CM | POA: Diagnosis not present

## 2016-06-15 LAB — CBC WITH DIFFERENTIAL/PLATELET
Basophils Absolute: 0 10*3/uL (ref 0.0–0.1)
Basophils Relative: 0 %
Eosinophils Absolute: 0 10*3/uL (ref 0.0–0.7)
Eosinophils Relative: 0 %
HCT: 34.9 % — ABNORMAL LOW (ref 39.0–52.0)
Hemoglobin: 11.9 g/dL — ABNORMAL LOW (ref 13.0–17.0)
Lymphocytes Relative: 7 %
Lymphs Abs: 0.9 10*3/uL (ref 0.7–4.0)
MCH: 34.3 pg — ABNORMAL HIGH (ref 26.0–34.0)
MCHC: 34.1 g/dL (ref 30.0–36.0)
MCV: 100.6 fL — ABNORMAL HIGH (ref 78.0–100.0)
Monocytes Absolute: 0.7 10*3/uL (ref 0.1–1.0)
Monocytes Relative: 6 %
Neutro Abs: 11.2 10*3/uL — ABNORMAL HIGH (ref 1.7–7.7)
Neutrophils Relative %: 87 %
Platelets: 182 10*3/uL (ref 150–400)
RBC: 3.47 MIL/uL — ABNORMAL LOW (ref 4.22–5.81)
RDW: 12.4 % (ref 11.5–15.5)
WBC: 12.7 10*3/uL — ABNORMAL HIGH (ref 4.0–10.5)

## 2016-06-15 MED ORDER — ACETAMINOPHEN 650 MG RE SUPP
650.0000 mg | RECTAL | Status: DC | PRN
  Administered 2016-06-16: 650 mg via RECTAL
  Filled 2016-06-15: qty 1

## 2016-06-15 MED ORDER — FENTANYL 50 MCG/HR TD PT72
50.0000 ug | MEDICATED_PATCH | TRANSDERMAL | Status: DC
  Administered 2016-06-15 – 2016-06-18 (×2): 50 ug via TRANSDERMAL
  Filled 2016-06-15 (×2): qty 1

## 2016-06-15 MED ORDER — CLINDAMYCIN PHOSPHATE 600 MG/50ML IV SOLN
600.0000 mg | Freq: Four times a day (QID) | INTRAVENOUS | Status: DC
  Administered 2016-06-15 – 2016-06-20 (×22): 600 mg via INTRAVENOUS
  Filled 2016-06-15 (×25): qty 50

## 2016-06-15 MED ORDER — WHITE PETROLATUM GEL
Status: AC
  Administered 2016-06-15: 15:00:00
  Filled 2016-06-15: qty 1

## 2016-06-15 MED ORDER — MORPHINE SULFATE (PF) 2 MG/ML IV SOLN
2.0000 mg | INTRAVENOUS | Status: DC | PRN
  Administered 2016-06-15 – 2016-06-16 (×12): 2 mg via INTRAVENOUS
  Filled 2016-06-15 (×12): qty 1

## 2016-06-15 NOTE — Progress Notes (Signed)
Patient ID: [name], male   DOB: 02-26-1946, 71 y.o.   MRN: [id number] Subjective: He's having a hard time swallowing any food. He is drinking liquids without much difficulty. His throat is pretty sore. He is having no trouble breathing. He denies any chest pain. He is very uncomfortable in his bed and is requesting a larger bed. Has really been bothering him as it has been difficult for him to get his regular medications.  Objective: Vital signs in last 24 hours: Temp:  [97.9 F (36.6 C)-101.7 F (38.7 C)] 101.7 F (38.7 C) (03/15 0634) Pulse Rate:  [72-106] 102 (03/15 0634) Resp:  [10-19] 18 (03/15 0634) BP: (112-147)/(52-82) 116/55 (03/15 0634) SpO2:  [95 %-100 %] 95 % (03/15 0634) Weight:  [96.2 kg (212 lb)] 96.2 kg (212 lb) (03/14 1012) Weight change:  Last BM Date: 06/14/16  Intake/Output from previous day: 03/14 0701 - 03/15 0700 In: 2348.8 [I.V.:2348.8] Out: 910 [Urine:900; Blood:10] Intake/Output this shift: No intake/output data recorded.  PHYSICAL EXAM: Overall he appears healthy and relatively uncomfortable. There is crepitance in the left side of the neck, but no erythema or fluctuance. Chest is clear to auscultation. Heart sounds are regular.  Lab Results: No results for input(s): WBC, HGB, HCT, PLT in the last 72 hours. BMET No results for input(s): NA, K, CL, CO2, GLUCOSE, BUN, CREATININE, CALCIUM in the last 72 hours.  Studies/Results: No results found.  Medications: I have reviewed the patient's current medications.  Assessment/Plan: Subcutaneous emphysema. Recommend we do a chest x-ray to evaluate if there is any air in the chest or mediastinum. We will keep him nothing by mouth for now. We will switch him over to not oral medications including fentanyl patch. We will continue to monitor.  LOS: 0 days   Tashiana Lamarca 06/15/2016, 7:37 AM

## 2016-06-15 NOTE — Progress Notes (Signed)
Patient ID: [name], male   DOB: March 07, 1946, 71 y.o.   MRN: [id number] Fever down, WBC mildly elevated this morning. Breathing well, voice clear. Subcut air still present, slightly on the right as well.   Continue NPO, recheck WBC in am. Ba swallow possibly tomorrow.   Pain meds working reasonably well.

## 2016-06-15 NOTE — Progress Notes (Signed)
Notified Dr.Schumaker of patient's temp this am of 101.7 Heart rate up to 102. BP 116/55, O2 sat 95% RA. Dr. Constance Holster will see him on rounds this am.

## 2016-06-16 ENCOUNTER — Inpatient Hospital Stay (HOSPITAL_COMMUNITY): Payer: Medicare Other

## 2016-06-16 LAB — CBC WITH DIFFERENTIAL/PLATELET
Basophils Absolute: 0 10*3/uL (ref 0.0–0.1)
Basophils Relative: 0 %
Eosinophils Absolute: 0 10*3/uL (ref 0.0–0.7)
Eosinophils Relative: 0 %
HCT: 33.2 % — ABNORMAL LOW (ref 39.0–52.0)
Hemoglobin: 11 g/dL — ABNORMAL LOW (ref 13.0–17.0)
Lymphocytes Relative: 16 %
Lymphs Abs: 1.2 10*3/uL (ref 0.7–4.0)
MCH: 34.3 pg — ABNORMAL HIGH (ref 26.0–34.0)
MCHC: 33.1 g/dL (ref 30.0–36.0)
MCV: 103.4 fL — ABNORMAL HIGH (ref 78.0–100.0)
Monocytes Absolute: 0.5 10*3/uL (ref 0.1–1.0)
Monocytes Relative: 6 %
Neutro Abs: 6 10*3/uL (ref 1.7–7.7)
Neutrophils Relative %: 78 %
Platelets: 137 10*3/uL — ABNORMAL LOW (ref 150–400)
RBC: 3.21 MIL/uL — ABNORMAL LOW (ref 4.22–5.81)
RDW: 12.7 % (ref 11.5–15.5)
WBC: 7.8 10*3/uL (ref 4.0–10.5)

## 2016-06-16 MED ORDER — MORPHINE SULFATE (PF) 4 MG/ML IV SOLN
3.0000 mg | INTRAVENOUS | Status: DC | PRN
  Administered 2016-06-16 – 2016-06-17 (×8): 3 mg via INTRAVENOUS
  Filled 2016-06-16 (×9): qty 1

## 2016-06-16 MED ORDER — IOPAMIDOL (ISOVUE-300) INJECTION 61%
INTRAVENOUS | Status: AC
  Administered 2016-06-16: 50 mL via ORAL
  Filled 2016-06-16: qty 150

## 2016-06-16 MED ORDER — IOPAMIDOL (ISOVUE-300) INJECTION 61%
150.0000 mL | Freq: Once | INTRAVENOUS | Status: AC | PRN
  Administered 2016-06-16: 50 mL via ORAL

## 2016-06-16 NOTE — Progress Notes (Signed)
Patient ID: [name], male   DOB: Apr 08, 1945, 71 y.o.   MRN: [id number] Swallowing study reviewed. There is evidence of a leak posteriorly. It communicates into the retropharyngeal space.  Pharmacy was consulted for possible TPN initiation but they recommend avoiding that until he is nothing by mouth for 7 days because of the risk of complications from TPN.  He has remained afebrile and his white blood cell count has normalized. We will continue with the IV antibiotics, continue nothing by mouth and continue to monitor the subcutaneous emphysema.  If he develops any evidence of abscess, we will need to consider surgical drainage.  We will monitor over the weekend.

## 2016-06-16 NOTE — Progress Notes (Signed)
Patient ID: [name], male   DOB: 1945-07-23, 71 y.o.   MRN: [id number] Subjective: Continues to complain of sore throat, he feels that the swelling has diminished. He is much more comfortable on the bariatric bed. Overall back pain control seems to be better.  Objective: Vital signs in last 24 hours: Temp:  [98.6 F (37 C)-100.6 F (38.1 C)] 98.6 F (37 C) (03/16 0528) Pulse Rate:  [90-99] 90 (03/16 0528) Resp:  [17-20] 18 (03/16 0528) BP: (121-128)/(63-70) 121/70 (03/16 0528) SpO2:  [93 %-95 %] 93 % (03/16 0528) Weight change:  Last BM Date: 06/14/16  Intake/Output from previous day: 03/15 0701 - 03/16 0700 In: 857.5 [I.V.:707.5; IV Piggyback:150] Out: 150 [Urine:150] Intake/Output this shift: No intake/output data recorded.  PHYSICAL EXAM: Continue crepitance in the neck on both sides, a little worse on the left still. A little bit of subcutaneous emphysema extending down to the upper sternal/clavicular area. His breathing is fine. His voice is clear. There is no skin erythema or fluctuance.  Lab Results:  Recent Labs  06/15/16 0847  WBC 12.7*  HGB 11.9*  HCT 34.9*  PLT 182   BMET No results for input(s): NA, K, CL, CO2, GLUCOSE, BUN, CREATININE, CALCIUM in the last 72 hours.  Studies/Results: Dg Chest 2 View  Result Date: 06/15/2016 CLINICAL DATA:  Subcutaneous emphysema after procedure EXAM: CHEST  2 VIEW COMPARISON:  04/07/2009 FINDINGS: There is subcutaneous emphysema at at the left neck base. The cardiac silhouette is normal in size. No mediastinal or hilar masses. No evidence of adenopathy. There are minimal bilateral pleural effusions.  Lungs are clear. No pneumothorax.  No pneumomediastinum. Skeletal structures are demineralized but intact. IMPRESSION: 1. No acute cardiopulmonary disease. 2. Left neck base subcutaneous emphysema.  No pneumothorax. Electronically Signed   By: Lajean Manes M.D.   On: 06/15/2016 08:48    Medications: I have reviewed the  patient's current medications.  Assessment/Plan: Recommend a barium swallow today to see if there is evidence of a persistent leak.  LOS: 1 day   Arles Rumbold 06/16/2016, 7:34 AM

## 2016-06-16 NOTE — Progress Notes (Signed)
Initial Nutrition Assessment  DOCUMENTATION CODES:   Not applicable  INTERVENTION:   -RD will follow for diet advancement and supplement as appropriate -Recommend initiation of nutrition support via enteral route:   Initiate Osmolite 1.5 @ 20 ml/hr via NGT and increase by 10 ml every 4 hours to goal rate of 50 ml/hr.   30 ml Prostat BID.    Tube feeding regimen provides 2100 kcal (100% of needs), 120 grams of protein, and 914 ml of H2O.   NUTRITION DIAGNOSIS:   Inadequate oral intake related to inability to eat as evidenced by NPO status.  GOAL:   Patient will meet greater than or equal to 90% of their needs  MONITOR:   Diet advancement, Labs, Weight trends, Skin, I & O's  REASON FOR ASSESSMENT:   Consult New TPN/TNA  ASSESSMENT:   Pt admitted for surgical management of Zenker's diverticulum.   S/p Procedure(s) on 06/14/16: ZENKER'S DIVERTICULECTOMY ENDOSCOPIC  Hx obtained from pt and family at bedside. Pt shares that he has suffered from dysphagia from tougher textures (particularly meats) over the past several years. Pt and family have noticed problem becoming progressively worse. Typical intake is 2 meals per day, with pt consuming a large of amount of non-nutritive fluids (including beer). Pt shares he experiences early satiety due to having to consume meals very slowly;per pt son "he can't even finish a meal of a burger and french fries". He had no difficulty swallowing liquids PTA.   Pt reports that he has lost a lot of weight and muscle loss over the years, which family partially contributes to decreased activity level since retiring (pt did a lot of weight lifting prior to retirement). Pt son estimates pt has lost about 8-10# over the past year. Per wt hx, pt has experienced a 12# (5.3%) wt loss over the past year, which is not significant for time frame.   Nutrition-Focused physical exam completed. Findings are no fat depletion, no muscle depletion, and no edema.    Pt and family are eager to hear results of barium swallow study performed this morning. Pt states, 'I'm hungry; I haven't eaten since Tuesday". Per imaging reports, leak is likely along the superior margin of the diverticulum, due to overlapping structures a measurable wall defect is not confidently identified.  Case discussed with TPN pharmacist, who was unaware of plan to start TPN. She shares that pt will not start TPN today due to notification received prior to cut off time and pt with no PICC line. Due to TPN shortage and pt with functioning GI tract, recommend enteral nutrition support if pt unable to take PO nutrition. Recommend placement of tube via IR service. ADDENDUM: Per pharmacy notes, plan to hold off on TPN until next week; Dr. Constance Holster in agreement.   Labs reviewed.   Diet Order:  Diet NPO time specified  Skin:  Reviewed, no issues  Last BM:  06/14/16  Height:   Ht Readings from Last 1 Encounters:  06/07/16 6\' 2"  (1.88 m)    Weight:   Wt Readings from Last 1 Encounters:  06/14/16 212 lb (96.2 kg)    Ideal Body Weight:  86.4 kg  BMI:  Body mass index is 27.22 kg/m.  Estimated Nutritional Needs:   Kcal:  2100-2300  Protein:  110-125 grams  Fluid:  2.1-2.3 L  EDUCATION NEEDS:   No education needs identified at this time  Jyla Hopf A. Jimmye Norman, RD, LDN, CDE Pager: [phone] After hours Pager: [phone]

## 2016-06-16 NOTE — Progress Notes (Signed)
Spoke to Dr. Constance Holster regarding starting TPN.  MD agrees to delay starting TPN until next week per [facility] recommendation.  At that point, central access for TPN administration will also need to be addressed.  Will discontinue TPN order set orders for now.   Cavan Bearden D. Mina Marble, PharmD, BCPS Pager:  [phone] 06/16/2016, 4:48 PM

## 2016-06-17 LAB — CBC
HCT: 31.4 % — ABNORMAL LOW (ref 39.0–52.0)
Hemoglobin: 10.5 g/dL — ABNORMAL LOW (ref 13.0–17.0)
MCH: 34.4 pg — ABNORMAL HIGH (ref 26.0–34.0)
MCHC: 33.4 g/dL (ref 30.0–36.0)
MCV: 103 fL — ABNORMAL HIGH (ref 78.0–100.0)
Platelets: 150 10*3/uL (ref 150–400)
RBC: 3.05 MIL/uL — ABNORMAL LOW (ref 4.22–5.81)
RDW: 12.7 % (ref 11.5–15.5)
WBC: 6.1 10*3/uL (ref 4.0–10.5)

## 2016-06-17 MED ORDER — MORPHINE SULFATE (PF) 4 MG/ML IV SOLN
4.0000 mg | INTRAVENOUS | Status: DC | PRN
  Administered 2016-06-17 – 2016-06-20 (×32): 4 mg via INTRAVENOUS
  Filled 2016-06-17 (×32): qty 1

## 2016-06-17 NOTE — Progress Notes (Signed)
3 Days Post-Op  Subjective: Patient is still hurting quite significantly. He has a slight hoarseness to his voice. He does not feel like the emphysema is any worse. He says the redness of his skin is better.  Objective: Vital signs in last 24 hours: Temp:  [98.8 F (37.1 C)-100.7 F (38.2 C)] 100.7 F (38.2 C) (03/17 0440) Pulse Rate:  [81-88] 87 (03/17 0440) Resp:  [17-18] 18 (03/17 0440) BP: (110-148)/(58-68) 128/58 (03/17 0440) SpO2:  [93 %-96 %] 94 % (03/17 0440) Last BM Date: 06/14/16  Intake/Output from previous day: 03/16 0701 - 03/17 0700 In: 1462.5 [I.V.:1262.5; IV Piggyback:200] Out: -  Intake/Output this shift: No intake/output data recorded.  Awake and alert. He has a Development worker, international aid. Nose is clear. Oral cavity oropharynx no lesions. Neck the subcutaneous emphysema still present left greater than right. The crepitance is down in his chest and up onto his mandible rim area. The skin does not have any erythema. Palpation is slightly tender.  Lab Results:   Recent Labs  06/15/16 0847 06/16/16 0608  WBC 12.7* 7.8  HGB 11.9* 11.0*  HCT 34.9* 33.2*  PLT 182 137*   BMET No results for input(s): NA, K, CL, CO2, GLUCOSE, BUN, CREATININE, CALCIUM in the last 72 hours. PT/INR No results for input(s): LABPROT, INR in the last 72 hours. ABG No results for input(s): PHART, HCO3 in the last 72 hours.  Invalid input(s): PCO2, PO2  Studies/Results: Dg Esophagus W/water Sol Cm  Result Date: 06/16/2016 CLINICAL DATA:  Subcutaneous emphysema after procedure. EXAM: ESOPHOGRAM/BARIUM SWALLOW TECHNIQUE: Single contrast examination was performed using water soluble contrast. FLUOROSCOPY TIME:  Fluoroscopy Time:  0.7 minutes Radiation Exposure Index (if provided by the fluoroscopic device): 5.9 mGy Number of Acquired Spot Images: 1 COMPARISON:  05/02/2016 FINDINGS: Interval endoscopic division of diverticulum with is visible left of midline. There is also ill-defined  accumulation of contrast in the retropharyngeal space, in an area larger than the diverticulum as seen on preoperative exam, consistent with leak. The leak is likely along the superior margin of the diverticulum, due to overlapping structures a measurable wall defect is not confidently identified. Soft tissue emphysema is extensive in the retropharynx and extends to the skullbase. Pharynx is displaced anteriorly and aspiration noted persistently. Aspiration was symptomatic and resulted in throat clearing. IMPRESSION: 1. Positive for operative site leak, as above. 2. Aspiration. There is pharyngeal distortion and stasis due to retropharyngeal gas and swelling. Electronically Signed   By: Monte Fantasia M.D.   On: 06/16/2016 09:18    Anti-infectives: Anti-infectives    Start     Dose/Rate Route Frequency Ordered Stop   06/15/16 0800  clindamycin (CLEOCIN) IVPB 600 mg     600 mg 100 mL/hr over 30 Minutes Intravenous Every 6 hours 06/15/16 0759        Assessment/Plan: s/p Procedure(s): ZENKER'S DIVERTICULECTOMY ENDOSCOPIC (N/A) Patient is still on intravenous antibiotics. He does have a persisting fever of about 100. He still hurting. He feels like the skin is better but the subcutaneous emphysema is still about the same. He is nothing by mouth. We talked about many issues including nutrition. Apparently TPN is not going to be done. We talked about incision and drainage of the neck if he is not stabilizing or if he worsens. Right now continuing the antibiotic seems appropriate but a repeat barium swallow will be contemplated within the next 24-48 hours. I do think a repeat CBC is needed.  LOS: 2 days  Melissa Montane 06/17/2016

## 2016-06-18 NOTE — Progress Notes (Signed)
4 Days Post-Op  Subjective: He is much better. He is hurting less and the swelling is decreased  Objective: Vital signs in last 24 hours: Temp:  [98.1 F (36.7 C)-98.8 F (37.1 C)] 98.8 F (37.1 C) (03/18 0510) Pulse Rate:  [73-82] 75 (03/18 0510) Resp:  [18-20] 18 (03/18 0510) BP: (129-135)/(65-78) 135/78 (03/18 0510) SpO2:  [95 %-100 %] 100 % (03/18 0510) Last BM Date: 06/16/16  Intake/Output from previous day: 03/17 0701 - 03/18 0700 In: 725 [I.V.:675; IV Piggyback:50] Out: -  Intake/Output this shift: No intake/output data recorded.  awake and alert. He has much less swelling and the subq air has decreased. The tenderness is still in both midlateral necks. no skin erythema. cv- rrr. l- clear ext- no tenderness  Lab Results:   Recent Labs  06/16/16 0608 06/17/16 1030  WBC 7.8 6.1  HGB 11.0* 10.5*  HCT 33.2* 31.4*  PLT 137* 150   BMET No results for input(s): NA, K, CL, CO2, GLUCOSE, BUN, CREATININE, CALCIUM in the last 72 hours. PT/INR No results for input(s): LABPROT, INR in the last 72 hours. ABG No results for input(s): PHART, HCO3 in the last 72 hours.  Invalid input(s): PCO2, PO2  Studies/Results: Dg Esophagus W/water Sol Cm  Result Date: 06/16/2016 CLINICAL DATA:  Subcutaneous emphysema after procedure. EXAM: ESOPHOGRAM/BARIUM SWALLOW TECHNIQUE: Single contrast examination was performed using water soluble contrast. FLUOROSCOPY TIME:  Fluoroscopy Time:  0.7 minutes Radiation Exposure Index (if provided by the fluoroscopic device): 5.9 mGy Number of Acquired Spot Images: 1 COMPARISON:  05/02/2016 FINDINGS: Interval endoscopic division of diverticulum with is visible left of midline. There is also ill-defined accumulation of contrast in the retropharyngeal space, in an area larger than the diverticulum as seen on preoperative exam, consistent with leak. The leak is likely along the superior margin of the diverticulum, due to overlapping structures a measurable  wall defect is not confidently identified. Soft tissue emphysema is extensive in the retropharynx and extends to the skullbase. Pharynx is displaced anteriorly and aspiration noted persistently. Aspiration was symptomatic and resulted in throat clearing. IMPRESSION: 1. Positive for operative site leak, as above. 2. Aspiration. There is pharyngeal distortion and stasis due to retropharyngeal gas and swelling. Electronically Signed   By: Monte Fantasia M.D.   On: 06/16/2016 09:18    Anti-infectives: Anti-infectives    Start     Dose/Rate Route Frequency Ordered Stop   06/15/16 0800  clindamycin (CLEOCIN) IVPB 600 mg     600 mg 100 mL/hr over 30 Minutes Intravenous Every 6 hours 06/15/16 0759        Assessment/Plan: s/p Procedure(s): ZENKER'S DIVERTICULECTOMY ENDOSCOPIC (N/A) he is much imoproved. He has no fever and the WBC is 6.1. He will continue the current treatment and will decide about another barium tomorrow.   LOS: 3 days    Melissa Montane 06/18/2016

## 2016-06-19 ENCOUNTER — Inpatient Hospital Stay (HOSPITAL_COMMUNITY): Payer: Medicare Other

## 2016-06-19 MED ORDER — IOPAMIDOL (ISOVUE-300) INJECTION 61%
150.0000 mL | Freq: Once | INTRAVENOUS | Status: AC | PRN
  Administered 2016-06-19: 135 mL via ORAL

## 2016-06-19 MED ORDER — IOPAMIDOL (ISOVUE-300) INJECTION 61%
INTRAVENOUS | Status: AC
  Administered 2016-06-19: 135 mL via ORAL
  Filled 2016-06-19: qty 150

## 2016-06-19 NOTE — Care Management Important Message (Signed)
Important Message  Patient Details  Name: [name] MRN: [id number] Date of Birth: June 28, 1945   Medicare Important Message Given:  Yes    Orbie Pyo 06/19/2016, 12:29 PM

## 2016-06-19 NOTE — Progress Notes (Signed)
Patient ID: BRAM HOTTEL, male   DOB: [birth year], 71 y.o.   MRN: [id number] Subjective: Feeling much better.  Objective: Vital signs in last 24 hours: Temp:  [98.1 F (36.7 C)-98.9 F (37.2 C)] 98.5 F (36.9 C) (03/19 0440) Pulse Rate:  [69-75] 75 (03/19 0440) Resp:  [17-18] 18 (03/19 0440) BP: (130-143)/(69-88) 135/71 (03/19 0440) SpO2:  [96 %-99 %] 99 % (03/19 0440) Weight change:  Last BM Date: 06/16/16  Intake/Output from previous day: 03/18 0701 - 03/19 0700 In: 2975 [I.V.:2775; IV Piggyback:200] Out: -  Intake/Output this shift: No intake/output data recorded.  PHYSICAL EXAM: No air in neck, slight residual in upper chest. No swelling or erythema.  Lab Results:  Recent Labs  06/17/16 1030  WBC 6.1  HGB 10.5*  HCT 31.4*  PLT 150   BMET No results for input(s): NA, K, CL, CO2, GLUCOSE, BUN, CREATININE, CALCIUM in the last 72 hours.  Studies/Results: No results found.  Medications: I have reviewed the patient's current medications.  Assessment/Plan: Repeat swallow today.Clinically much better.  LOS: 4 days   Ginnette Gates 06/19/2016, 9:54 AM

## 2016-06-19 NOTE — Consult Note (Signed)
[facility] CM Primary Care Navigator  06/19/2016  [name] Nov 27, 1945 [id number]   Met with patient and wife [name]) at the bedside to identify possible discharge needs. Patient reports having inability to eat/ drink and inability to swallow properly that had led to this admission.  Patient endorses [name] with [facility] as the primary care provider.    Patient shared using [facility] to obtain medications without any problem.   Patient reports managing his own medications at home straight out of the containers.   He states being independent with self care and able to drive prior to admission. Wife [name]) or son [name]) can provide transportation to his doctors' appointments if needed.  Wife will be his primary caregiver at home as stated.  Anticipated discharge plan is home with wife per patient.  Patient and wife voiced understanding to call primary care provider's office when he gets home, for a post discharge follow-up appointment within a week or sooner if needs arise. Patient letter (with PCP's contact number) was provided as a reminder.  Both denied any health management needs or concerns at this time.  For additional questions please contact:  [name], BSN, RN-BC [facility] PRIMARY CARE Navigator Cell: [phone]

## 2016-06-20 ENCOUNTER — Encounter (HOSPITAL_COMMUNITY): Payer: Self-pay | Admitting: General Practice

## 2016-06-20 MED ORDER — HYDROCODONE-ACETAMINOPHEN 7.5-325 MG/15ML PO SOLN: 15.0000 mL | Freq: Four times a day (QID) | ORAL | 0 refills | Status: DC | PRN

## 2016-06-20 NOTE — Progress Notes (Signed)
Patient ID: [name], male   DOB: 10/18/1945, 71 y.o.   MRN: [id number] Subjective: He continues to feel better, less pain, less swelling.  Objective: Vital signs in last 24 hours: Temp:  [98.1 F (36.7 C)-98.9 F (37.2 C)] 98.1 F (36.7 C) (03/20 0800) Pulse Rate:  [67-71] 70 (03/20 0800) Resp:  [17-18] 18 (03/20 0623) BP: (140-149)/(70-75) 149/70 (03/20 0800) SpO2:  [94 %-99 %] 94 % (03/20 0800) Weight change:  Last BM Date: 06/19/16  Intake/Output from previous day: 03/19 0701 - 03/20 0700 In: 2120 [I.V.:1820; IV Piggyback:300] Out: 1 [Stool:1] Intake/Output this shift: No intake/output data recorded.  PHYSICAL EXAM: Slight residual crepitance in the neck on the left, really none on the right. No swelling or erythema.  Lab Results:  Recent Labs  06/17/16 1030  WBC 6.1  HGB 10.5*  HCT 31.4*  PLT 150   BMET No results for input(s): NA, K, CL, CO2, GLUCOSE, BUN, CREATININE, CALCIUM in the last 72 hours.  Studies/Results: Dg Esophagus W/water Sol Cm  Result Date: 06/19/2016 CLINICAL DATA:  Postop day 5 Zenker's diverticulum repair. Follow-up leak EXAM: ESOPHOGRAM/BARIUM SWALLOW TECHNIQUE: Single contrast examination was performed using water-soluble Omnipaque contrast. FLUOROSCOPY TIME:  Fluoroscopy Time:  1 minutes 54 second Radiation Exposure Index (if provided by the fluoroscopic device): Number of Acquired Spot Images: 0 COMPARISON:  Esophagram 06/16/2016 FINDINGS: Pharyngeal leak of contrast again noted. This is located posterolaterally on the left. The collection of contrast is irregular in shape and appears larger compared with the prior esophagram study. Remainder of the esophagus is normal. The patient had coughing following swallowing. There is penetration of contrast to the cords. Previously noted aspiration has improved in the interval. Subcutaneous gas in the soft tissues of the neck left greater than right again noted. IMPRESSION: Persistent pharyngeal  leak posterolaterally on the left. The contrast collection appears larger compared to the prior study Penetration of contrast to the cord level, with improvement in aspiration since the prior study. Electronically Signed   By: Franchot Gallo M.D.   On: 06/19/2016 13:39    Medications: I have reviewed the patient's current medications.  Assessment/Plan: I reviewed the swallowing study from yesterday. There is evidence of persistent extravasation although is not clear if it's truly persistent leak or a residual diverticulum. In any case the contrast clears from the space . We discussed last night that we would reevaluate this morning and if not any worse, and if continuing to improve we would start clear liquids. I had him drink some water for me this morning and he seemed to do okay. I will allow him to have clear liquids and will reevaluate in a few hours.     LOS: 5 days   Xitlalic Maslin 06/20/2016, 8:56 AM

## 2016-06-20 NOTE — Discharge Summary (Signed)
Physician Discharge Summary  Patient ID: [name] MRN: [id number] DOB/AGE: July 03, 1945 71 y.o.  Admit date: 06/14/2016 Discharge date: 06/20/2016  Admission Diagnoses:Zenker's diverticulum  Discharge Diagnoses:  Active Problems:   Zenker's diverticulum   Discharged Condition: fair  Hospital Course: complicated by subcutaneous emphysema secondary to a pharyngeal leak. Treated with IV fluids, NPO, and IV Abx.   Consults: none  Significant Diagnostic Studies: barium swallow  Treatments: surgery: endoscopic zenkers  Discharge Exam: Blood pressure (!) 149/70, pulse 70, temperature 98.1 F (36.7 C), temperature source Oral, resp. rate 18, weight 96.2 kg (212 lb), SpO2 94 %. PHYSICAL EXAM: Breathing well, able to drink liquids without difficulty. Minimal residual subcutaneous emphysema left anterior and upper chest skin.  Disposition: 01-Home or Self Care  Discharge Instructions    Call MD for:  difficulty breathing, headache or visual disturbances    Complete by:  As directed    Call MD for:  persistant nausea and vomiting    Complete by:  As directed    Call MD for:  redness, tenderness, or signs of infection (pain, swelling, redness, odor or green/yellow discharge around incision site)    Complete by:  As directed    Call MD for:  severe uncontrolled pain    Complete by:  As directed    Call MD for:  temperature >100.4    Complete by:  As directed    Increase activity slowly    Complete by:  As directed      Allergies as of 06/20/2016      Reactions   No Known Allergies       Medication List    TAKE these medications   allopurinol 100 MG tablet Commonly known as:  ZYLOPRIM Take 5 tablets (500 mg total) by mouth daily. What changed:  how much to take   aspirin 325 MG tablet Take 325 mg by mouth daily.   CALCIUM 600 1500 (600 Ca) MG Tabs tablet Generic drug:  calcium carbonate Take 600 mg of elemental calcium by mouth 2 (two) times daily.   Cetirizine  HCl 10 MG Tbdp Take 10 mg by mouth daily as needed (for allergies).   colchicine 0.6 MG tablet Take 0.6 mg by mouth 3 (three) times daily as needed (for gout flares).   cyanocobalamin 2000 MCG tablet Take 2,000 mcg by mouth daily.   fluticasone 50 MCG/ACT nasal spray Commonly known as:  FLONASE Place 2 sprays into both nostrils daily as needed for allergies.   furosemide 40 MG tablet Commonly known as:  LASIX Take 1 tablet (40 mg total) by mouth daily. What changed:  when to take this  reasons to take this   HYDROcodone-acetaminophen 7.5-325 mg/15 ml solution Commonly known as:  HYCET Take 15 mLs by mouth 4 (four) times daily as needed for moderate pain.   ibuprofen 600 MG tablet Commonly known as:  ADVIL,MOTRIN Take 600 mg by mouth 3 (three) times daily as needed (for pain.).   indomethacin 50 MG capsule Commonly known as:  INDOCIN Take 1 capsule (50 mg total) by mouth 3 (three) times daily as needed. What changed:  reasons to take this   magnesium oxide 400 MG tablet Commonly known as:  MAG-OX Take 400 mg by mouth 2 (two) times daily.   methocarbamol 750 MG tablet Commonly known as:  ROBAXIN Take 750 mg by mouth every 8 (eight) hours as needed (for muscle spasm.).   morphine 15 MG 12 hr tablet Commonly known as:  MS CONTIN  Take 15 mg by mouth every 12 (twelve) hours.   multivitamin tablet Take 1 tablet by mouth daily.   oxyCODONE 5 MG immediate release tablet Commonly known as:  Oxy IR/ROXICODONE Take 1 tablet (5 mg total) by mouth every 4 (four) hours as needed for severe pain. In addition to current chronic pain medication   sildenafil 100 MG tablet Commonly known as:  VIAGRA Take 100 mg by mouth daily as needed for erectile dysfunction.   triamcinolone cream 0.1 % Commonly known as:  KENALOG Apply 1 application topically 2 (two) times daily.      Follow-up Information    Izora Gala, MD. Schedule an appointment as soon as possible for a visit on  06/23/2016.   Specialty:  Otolaryngology Contact information: [address] [place] [address] [phone]           Signed: Izora Gala 06/20/2016, 12:41 PM

## 2016-06-20 NOTE — Progress Notes (Signed)
Patient ID: [name], male   DOB: 02-13-1946, 71 y.o.   MRN: [id number] In the past hour or so, he has drank about 6 cups of liquids and ate jello without any new symptoms. Exam is unchanged, definitely no worsening of the subcutaneous air.  Will discharge home and have him follow up in a couple days.

## 2016-06-20 NOTE — Discharge Instructions (Signed)
Liquids only, no carbonation.  Call immediately if any fever, worsening pain, trouble swallowing, chest pain, shortness of breath, new neck swelling, worsening air under the skin.  Remove the pain med patch in the morning and then resume your regular oxycodone and morphine.

## 2016-06-20 NOTE — Progress Notes (Signed)
Discharge paperwork given to patient. IV removed by student nurse. RN called Dr. Janeice Robinson office due to patient being hesitant about taking Hycet pain medication. Per nurse at Dr. Janeice Robinson office, Dr. Constance Holster will call the patient's pharmacy with a pain medication for him. Patient is ready for discharge.

## 2016-06-21 ENCOUNTER — Telehealth: Payer: Self-pay | Admitting: *Deleted

## 2016-06-21 NOTE — Telephone Encounter (Signed)
Transition Care Management Follow-up Telephone Call  How have you been since you were released from the hospital? "I've been doing well, I'm just glad to be out of the hospital."    Do you understand why you were in the hospital? Yes   Do you understand the discharge instrcutions? Yes  Items Reviewed:  Medications reviewed: Yes, patient reports no changes made.   Allergies reviewed: Yes, no changes made  Dietary changes reviewed: patient unable to tolerate meals d/t diverticulum   Referrals reviewed: Yes, patient scheduled to see surgeon Dr. Constance Holster on Friday 06/23/16   Functional Questionnaire:   Activities of Daily Living (ADLs):   He states they are independent in the following: ambulation, bathing and hygiene, continence, grooming, toileting and dressing States they require assistance with the following: none   Any transportation issues/concerns?: no   Any patient concerns? no   Confirmed importance and date/time of follow-up visits scheduled: yes, patient understands importance of follow up with surgeon on Friday, patient declines hospital follow up visit with Dr. Burnice Logan (PCP) at this time. Patient states he wants to see outcome of surgeon visit on Friday and will follow up with PCP once he is ready   Confirmed with patient if condition begins to worsen call PCP or go to the ER.  Patient was given the Call-a-Nurse line [phone]: yes; understanding voiced.

## 2016-06-23 DIAGNOSIS — J300 Vasomotor rhinitis: Secondary | ICD-10-CM | POA: Insufficient documentation

## 2016-07-21 DIAGNOSIS — J029 Acute pharyngitis, unspecified: Secondary | ICD-10-CM | POA: Insufficient documentation

## 2016-07-27 ENCOUNTER — Telehealth: Payer: Self-pay | Admitting: Internal Medicine

## 2016-07-27 DIAGNOSIS — R200 Anesthesia of skin: Secondary | ICD-10-CM

## 2016-07-27 DIAGNOSIS — R202 Paresthesia of skin: Principal | ICD-10-CM

## 2016-07-27 NOTE — Telephone Encounter (Signed)
Pt would like to see a podiatry for  toes numbness and discoloration. Pt has medicare and tricare for life. Pt was last seen 2017 and does not want an appt. Pt is aware md out of office today

## 2016-07-28 NOTE — Telephone Encounter (Signed)
Okay for podiatry referral

## 2016-07-28 NOTE — Telephone Encounter (Signed)
A Podiatry referral for numbness placed in epic.

## 2016-07-28 NOTE — Telephone Encounter (Signed)
Please see message below, please advise 

## 2016-08-16 ENCOUNTER — Ambulatory Visit (INDEPENDENT_AMBULATORY_CARE_PROVIDER_SITE_OTHER): Payer: Medicare Other

## 2016-08-16 ENCOUNTER — Encounter: Payer: Self-pay | Admitting: Podiatry

## 2016-08-16 ENCOUNTER — Ambulatory Visit (INDEPENDENT_AMBULATORY_CARE_PROVIDER_SITE_OTHER): Payer: Medicare Other | Admitting: Podiatry

## 2016-08-16 VITALS — Resp 16 | Ht 74.0 in | Wt 215.0 lb

## 2016-08-16 DIAGNOSIS — M79605 Pain in left leg: Secondary | ICD-10-CM | POA: Diagnosis not present

## 2016-08-16 DIAGNOSIS — M79672 Pain in left foot: Secondary | ICD-10-CM

## 2016-08-16 DIAGNOSIS — L600 Ingrowing nail: Secondary | ICD-10-CM | POA: Diagnosis not present

## 2016-08-16 DIAGNOSIS — M79604 Pain in right leg: Secondary | ICD-10-CM

## 2016-08-16 DIAGNOSIS — M779 Enthesopathy, unspecified: Secondary | ICD-10-CM

## 2016-08-16 DIAGNOSIS — B351 Tinea unguium: Secondary | ICD-10-CM | POA: Diagnosis not present

## 2016-08-16 DIAGNOSIS — M79671 Pain in right foot: Secondary | ICD-10-CM

## 2016-08-16 NOTE — Patient Instructions (Signed)

## 2016-08-16 NOTE — Progress Notes (Signed)
   Subjective:    Patient ID: [name], male    DOB: 1946/03/27, 71 y.o.   MRN: [id number]  HPI  Chief Complaint  Patient presents with  . Foot Pain    Bilateral; dorsal and plantar; pt stated, "Feet are sore, more pain on the bottom of feet; has had for years"  . Toe Pain    Left foot; 2nd toe; pt stated, "Can't feel toe; feels pressure in toe"; x2+ yrs     Review of Systems  HENT: Positive for hearing loss and tinnitus.   Musculoskeletal: Positive for arthralgias, back pain and gait problem.  All other systems reviewed and are negative.      Objective:   Physical Exam        Assessment & Plan:

## 2016-08-21 NOTE — Progress Notes (Signed)
Subjective:    Patient ID: [name], male   DOB: 71 y.o.   MRN: [id number]   HPI patient presents with ingrown toenail deformity right hallux and nail disease of both feet along with some tingling of both feet    Review of Systems  All other systems reviewed and are negative.       Objective:  Physical Exam  Constitutional: He is oriented to person, place, and time.  Cardiovascular: Intact distal pulses.   Musculoskeletal: Normal range of motion.  Neurological: He is alert and oriented to person, place, and time.  Skin: Skin is warm and dry.  Nursing note and vitals reviewed.  neurovascular status was found to be intact muscle strength was adequate with patient found to have a painful right hallux lateral border that's incurvated sore when pressed thick nailbeds 1-5 both feet with yellow brittle disease and pain and noted to have mild tingling with minimal disruption of sharp dull and vibratory     Assessment:    Ingrown toenail deformity right hallux that's painful along with thick yellow brittle nailbeds 1-5 both feet that are painful and very mild neuropathic like symptomatology     Plan:    H&P and conditions reviewed. I've recommended correction of the ingrown toenail and explained procedure and risk and today I infiltrated the right hallux 60 mg Xylocaine Marcaine mixture remove the border exposed matrix and applied phenol 3 applications 30 seconds followed by alcohol lavaged sterile dressing. Gave instructions on soaks and then debrided nailbeds 1-5 both feet with no iatrogenic bleeding and discussed that if tingling were to get worse we may need to send to a neurologist  X-rays indicate there is no indications of arthritis or osteoporosis with mild depression of the arch

## 2016-09-02 NOTE — Addendum Note (Signed)
Addendum  created 09/02/16 1114 by Duane Boston, MD   Sign clinical note

## 2016-09-26 NOTE — Progress Notes (Addendum)
Subjective:   [name] is a 71 y.o. male who presents for Medicare Annual/Subsequent preventive examination.  Follows eye doctor and primary care at the [place].   He had a failed repair of Zenker's diverticulum in March. He is following w/ Dr. Constance Holster and they are planning to refer him to specialist at [facility].   Review of Systems:  No ROS.  Medicare Wellness Visit. Additional risk factors are reflected in the social history.  Cardiac Risk Factors include: advanced age (>6men, >14 women);dyslipidemia;male gender  Sleep patterns: has frequent nighttime awakenings, gets up 3-4 times nightly to void and sleeps 6 hours nightly.   Home Safety/Smoke Alarms: Feels safe in home. Smoke alarms in place.  Living environment; residence and Firearm Safety: Lives w/ wife. 1-story house/ trailer, firearms stored safely. Uses cane PRN for long distances and also wears knee braces for walking longer distances. He is not wearing knee braces or using his cane today. Seat Belt Safety/Bike Helmet: Wears seat belt.   Counseling:   [facility]  Male:   CCS- last 09/07/10. 4 polyps removed, internal hemorrhoids. 5 year recall.     PSA-  Lab Results  Component Value Date   PSA 0.32 12/28/2009      Objective:    Vitals: BP 124/64   Pulse 69   Ht 6\' 2"  (1.88 m)   Wt 209 lb 8 oz (95 kg)   SpO2 98%   BMI 26.90 kg/m   Body mass index is 26.9 kg/m.  Tobacco History  Smoking Status  . Never Smoker  Smokeless Tobacco  . Never Used     Counseling given: Not Answered   Past Medical History:  Diagnosis Date  . Chronic back pain    buldging disc  . Depression   . GERD (gastroesophageal reflux disease)   . GOUT    takes Allopurinol daily and Colchicine as needed  . History of bronchitis    many yrs ago  . History of colonoscopy    benign  . History of shingles   . Joint pain   . Joint swelling   . Muscle spasm    takes Robaxin as needed  . Nocturia    . OSTEOARTHRITIS   . Peripheral edema    takes Lasix daily as needed  . Pneumonia 1969  . Seasonal allergies    takes Zyrtec and uses Flonase is needed  . TESTICULAR HYPOFUNCTION   . Zenker diverticula    Past Surgical History:  Procedure Laterality Date  . ANKLE SURGERY     4 times  . COLONOSCOPY    . ELBOW SURGERY Right    torn tendon  . INGUINAL HERNIA REPAIR     right  . KNEE SURGERY     3 on each knee  . SHOULDER ARTHROSCOPY Right 06/15/2015   Procedure: RIGHT SHOULDER ARTHROSCOPY, ACROMIOPLASTY, DEBRIDEMENT;  Surgeon: Melrose Nakayama, MD;  Location: [facility];  Service: Orthopedics;  Laterality: Right;  . shoulder arthrosopy Left   . TONSILLECTOMY AND ADENOIDECTOMY    . TUMOR EXCISION     sebaceous cyst removed from chest  . ZENKER'S DIVERTICULECTOMY ENDOSCOPIC N/A 06/14/2016   Procedure: Stanford Breed DIVERTICULECTOMY ENDOSCOPIC;  Surgeon: Izora Gala, MD;  Location: [facility];  Service: ENT;  Laterality: N/A;   History reviewed. No pertinent family history. History  Sexual Activity  . Sexual activity: Not on file    Outpatient Encounter Prescriptions as of 09/27/2016  Medication Sig  .  allopurinol (ZYLOPRIM) 100 MG tablet Take 5 tablets (500 mg total) by mouth daily. (Patient taking differently: Take 300 mg by mouth daily. )  . aspirin 325 MG tablet Take 325 mg by mouth daily.    Marland Kitchen CALCIUM 600 1500 (600 Ca) MG TABS tablet Take 600 mg of elemental calcium by mouth 2 (two) times daily.   . Cetirizine HCl 10 MG TBDP Take 10 mg by mouth daily as needed (for allergies).   . colchicine 0.6 MG tablet Take 0.6 mg by mouth 3 (three) times daily as needed (for gout flares).   . cyanocobalamin 2000 MCG tablet Take 2,000 mcg by mouth daily.  . fluticasone (FLONASE) 50 MCG/ACT nasal spray Place 2 sprays into both nostrils daily as needed for allergies.   . furosemide (LASIX) 40 MG tablet Take 1 tablet (40 mg total) by mouth daily. (Patient taking differently: Take 40 mg by mouth daily as  needed (for swelling in joints.). )  . ibuprofen (ADVIL,MOTRIN) 600 MG tablet Take 600 mg by mouth 3 (three) times daily as needed (for pain.).   Marland Kitchen indomethacin (INDOCIN) 50 MG capsule Take 1 capsule (50 mg total) by mouth 3 (three) times daily as needed. (Patient taking differently: Take 50 mg by mouth 3 (three) times daily as needed (for pain.). )  . ipratropium (ATROVENT) 0.03 % nasal spray Place 1 spray into the nose daily as needed.  . magnesium oxide (MAG-OX) 400 MG tablet Take 400 mg by mouth 2 (two) times daily.  . methocarbamol (ROBAXIN) 750 MG tablet Take 750 mg by mouth every 8 (eight) hours as needed (for muscle spasm.).   Marland Kitchen morphine (MS CONTIN) 15 MG 12 hr tablet Take 15 mg by mouth every 12 (twelve) hours.  . Multiple Vitamin (MULTIVITAMIN) tablet Take 1 tablet by mouth daily.  Marland Kitchen oxyCODONE (OXY IR/ROXICODONE) 5 MG immediate release tablet Take 1 tablet (5 mg total) by mouth every 4 (four) hours as needed for severe pain. In addition to current chronic pain medication  . sildenafil (VIAGRA) 100 MG tablet Take 100 mg by mouth daily as needed for erectile dysfunction.  . triamcinolone cream (KENALOG) 0.1 % Apply 1 application topically 2 (two) times daily.   No facility-administered encounter medications on file as of 09/27/2016.     Activities of Daily Living In your present state of health, do you have any difficulty performing the following activities: 09/27/2016 06/20/2016  Hearing? N -  Vision? N -  Difficulty concentrating or making decisions? N -  Walking or climbing stairs? N -  Dressing or bathing? N -  Doing errands, shopping? N N  Preparing Food and eating ? N -  Using the Toilet? N -  In the past six months, have you accidently leaked urine? N -  Do you have problems with loss of bowel control? N -  Managing your Medications? N -  Managing your Finances? N -  Housekeeping or managing your Housekeeping? N -  Some recent data might be hidden    Patient Care  Team: Marletta Lor, MD as PCP - General Izora Gala, MD as Consulting Physician (Otolaryngology) Melrose Nakayama, MD as Consulting Physician (Orthopedic Surgery)   Assessment:    Physical assessment deferred to PCP.  Exercise Activities and Dietary recommendations Current Exercise Habits: The patient does not participate in regular exercise at present. Likes to lift weights 2-3x weekly, but has been limited recently due to his health issues.  Diet (meal preparation, eat out, water intake, caffeinated  beverages, dairy products, fruits and vegetables): in general, a "healthy" diet  , well balanced, on average, 3 meals per day. Regular diet. Keeps a garden and eats fresh seasonal vegetables. On average, drinks about a quart water daily.  Goals    . Complete successful procedure to fix Zenker's      Fall Risk Fall Risk  09/27/2016 04/20/2015 04/09/2014 03/10/2014  Falls in the past year? Yes No No No  Number falls in past yr: 2 or more - - -  Injury with Fall? No - - -   Depression Screen PHQ 2/9 Scores 09/27/2016 04/20/2015 04/09/2014  PHQ - 2 Score 1 0 0    Cognitive Function       Ad8 score reviewed for issues:  Issues making decisions: No  Less interest in hobbies / activities: No  Repeats questions, stories (family complaining): No  Trouble using ordinary gadgets (microwave, computer, phone): No  Forgets the month or year: No  Mismanaging finances: No  Remembering appts: No  Daily problems with thinking and/or memory: No Ad8 score is= 0  Immunization History  Administered Date(s) Administered  . Influenza Split 12/31/2012  . Influenza Whole 01/01/2010  . Influenza, High Dose Seasonal PF 01/05/2016  . Influenza-Unspecified 01/14/2014, 02/03/2015  . Pneumococcal Conjugate-13 04/23/2014  . Pneumococcal Polysaccharide-23 06/28/2010  . Tdap 02/05/2013  . Zoster 03/17/2009   Screening Tests Health Maintenance  Topic Date Due  . COLONOSCOPY  09/07/2015  .  INFLUENZA VACCINE  11/01/2016  . TETANUS/TDAP  02/06/2023  . Hepatitis C Screening  Completed  . PNA vac Low Risk Adult  Completed      Plan:    Follow-up w/ PCP as scheduled.  Bring a copy of your advance directives to your next office visit.  Consider the new shingles vaccine, Shingrix. This is available at most pharmacies and the [place] may have it as well.  Call Dr. Lynne Leader office at [phone] to discuss setting up a repeat colonoscopy.  I have personally reviewed and noted the following in the patient's chart:   . Medical and social history . Use of alcohol, tobacco or illicit drugs  . Current medications and supplements . Functional ability and status . Nutritional status . Physical activity . Advanced directives . List of other physicians . Vitals . Screenings to include cognitive, depression, and falls . Referrals and appointments  In addition, I have reviewed and discussed with patient certain preventive protocols, quality metrics, and best practice recommendations. A written personalized care plan for preventive services as well as general preventive health recommendations were provided to patient.     Dorrene German, RN  09/27/2016  Agree with assessment as above.  Eulas Post MD [facility]

## 2016-09-27 ENCOUNTER — Ambulatory Visit (INDEPENDENT_AMBULATORY_CARE_PROVIDER_SITE_OTHER): Payer: Medicare Other

## 2016-09-27 VITALS — BP 124/64 | HR 69 | Ht 74.0 in | Wt 209.5 lb

## 2016-09-27 DIAGNOSIS — Z0000 Encounter for general adult medical examination without abnormal findings: Secondary | ICD-10-CM

## 2016-09-27 NOTE — Patient Instructions (Addendum)
[name] , Thank you for taking time to come for your Medicare Wellness Visit. I appreciate your ongoing commitment to your health goals. Please review the following plan we discussed and let me know if I can assist you in the future.   Bring a copy of your advance directives to your next office visit.  Consider the new shingles vaccine, Shingrix. This is available at most pharmacies and the [place] may have it as well.  Call Dr. Lynne Leader office at [phone] to discuss setting up a repeat colonoscopy.  These are the goals we discussed: Goals    . Complete successful procedure to fix Zenker's       This is a list of the screening recommended for you and due dates:  Health Maintenance  Topic Date Due  . Colon Cancer Screening  09/07/2015  . Flu Shot  11/01/2016  . Tetanus Vaccine  02/06/2023  .  Hepatitis C: One time screening is recommended by Center for Disease Control  (CDC) for  adults born from [birth year] through 1965.   Completed  . Pneumonia vaccines  Completed   Preventive Care 71 Years and Older, Male Preventive care refers to lifestyle choices and visits with your health care provider that can promote health and wellness. What does preventive care include?  A yearly physical exam. This is also called an annual well check.  Dental exams once or twice a year.  Routine eye exams. Ask your health care provider how often you should have your eyes checked.  Personal lifestyle choices, including: ? Daily care of your teeth and gums. ? Regular physical activity. ? Eating a healthy diet. ? Avoiding tobacco and drug use. ? Limiting alcohol use. ? Practicing safe sex. ? Taking low doses of aspirin every day. ? Taking vitamin and mineral supplements as recommended by your health care provider. What happens during an annual well check? The services and screenings done by your health care provider during your annual well check will depend on your age, overall health, lifestyle risk  factors, and family history of disease. Counseling Your health care provider may ask you questions about your:  Alcohol use.  Tobacco use.  Drug use.  Emotional well-being.  Home and relationship well-being.  Sexual activity.  Eating habits.  History of falls.  Memory and ability to understand (cognition).  Work and work Statistician.  Screening You may have the following tests or measurements:  Height, weight, and BMI.  Blood pressure.  Lipid and cholesterol levels. These may be checked every 5 years, or more frequently if you are over 71 years old.  Skin check.  Lung cancer screening. You may have this screening every year starting at age 71 if you have a 30-pack-year history of smoking and currently smoke or have quit within the past 15 years.  Fecal occult blood test (FOBT) of the stool. You may have this test every year starting at age 71.  Flexible sigmoidoscopy or colonoscopy. You may have a sigmoidoscopy every 5 years or a colonoscopy every 10 years starting at age 71.  Prostate cancer screening. Recommendations will vary depending on your family history and other risks.  Hepatitis C blood test.  Hepatitis B blood test.  Sexually transmitted disease (STD) testing.  Diabetes screening. This is done by checking your blood sugar (glucose) after you have not eaten for a while (fasting). You may have this done every 1-3 years.  Abdominal aortic aneurysm (AAA) screening. You may need this if you are a current  or former smoker.  Osteoporosis. You may be screened starting at age 71 if you are at high risk.  Talk with your health care provider about your test results, treatment options, and if necessary, the need for more tests. Vaccines Your health care provider may recommend certain vaccines, such as:  Influenza vaccine. This is recommended every year.  Tetanus, diphtheria, and acellular pertussis (Tdap, Td) vaccine. You may need a Td booster every 10  years.  Varicella vaccine. You may need this if you have not been vaccinated.  Zoster vaccine. You may need this after age 71.  Measles, mumps, and rubella (MMR) vaccine. You may need at least one dose of MMR if you were born in 1957 or later. You may also need a second dose.  Pneumococcal 13-valent conjugate (PCV13) vaccine. One dose is recommended after age 71.  Pneumococcal polysaccharide (PPSV23) vaccine. One dose is recommended after age 71.  Meningococcal vaccine. You may need this if you have certain conditions.  Hepatitis A vaccine. You may need this if you have certain conditions or if you travel or work in places where you may be exposed to hepatitis A.  Hepatitis B vaccine. You may need this if you have certain conditions or if you travel or work in places where you may be exposed to hepatitis B.  Haemophilus influenzae type b (Hib) vaccine. You may need this if you have certain risk factors.  Talk to your health care provider about which screenings and vaccines you need and how often you need them. This information is not intended to replace advice given to you by your health care provider. Make sure you discuss any questions you have with your health care provider. Document Released: 04/16/2015 Document Revised: 12/08/2015 Document Reviewed: 01/19/2015 Elsevier Interactive Patient Education  2017 [employer].

## 2016-09-28 ENCOUNTER — Other Ambulatory Visit: Payer: Self-pay | Admitting: Otolaryngology

## 2016-09-28 DIAGNOSIS — K225 Diverticulum of esophagus, acquired: Secondary | ICD-10-CM

## 2016-10-02 ENCOUNTER — Ambulatory Visit
Admission: RE | Admit: 2016-10-02 | Discharge: 2016-10-02 | Disposition: A | Discharge status: Home / Self Care | Payer: Medicare Other | Source: Ambulatory Visit | Attending: Otolaryngology | Admitting: Otolaryngology

## 2016-10-02 DIAGNOSIS — K225 Diverticulum of esophagus, acquired: Secondary | ICD-10-CM

## 2016-11-02 ENCOUNTER — Ambulatory Visit (INDEPENDENT_AMBULATORY_CARE_PROVIDER_SITE_OTHER): Payer: Medicare Other | Admitting: Podiatry

## 2016-11-02 DIAGNOSIS — M79676 Pain in unspecified toe(s): Secondary | ICD-10-CM | POA: Diagnosis not present

## 2016-11-02 DIAGNOSIS — M79605 Pain in left leg: Secondary | ICD-10-CM

## 2016-11-02 DIAGNOSIS — M79604 Pain in right leg: Secondary | ICD-10-CM

## 2016-11-02 DIAGNOSIS — B351 Tinea unguium: Secondary | ICD-10-CM | POA: Diagnosis not present

## 2016-11-03 NOTE — Progress Notes (Signed)
Subjective:    Patient ID: [name], male   DOB: 71 y.o.   MRN: [id number]   HPI patient presents with nail disease 1-5 both feet with thickness incurvation and discomfort and inability for patient to cut    ROS      Objective:  Physical Exam neurovascular status unchanged with thick yellow brittle nailbeds 1-5 both feet that are painful     Assessment:    Mycotic nail infection with pain 1-5 both feet     Plan:    Debride painful nailbeds 1-5 both feet with no iatrogenic bleeding noted

## 2016-11-08 ENCOUNTER — Encounter: Payer: Self-pay | Admitting: Internal Medicine

## 2016-11-08 ENCOUNTER — Ambulatory Visit (INDEPENDENT_AMBULATORY_CARE_PROVIDER_SITE_OTHER): Payer: Medicare Other | Admitting: Internal Medicine

## 2016-11-08 VITALS — BP 118/60 | HR 86 | Temp 97.9°F | Ht 74.0 in | Wt 208.0 lb

## 2016-11-08 DIAGNOSIS — E538 Deficiency of other specified B group vitamins: Secondary | ICD-10-CM | POA: Diagnosis not present

## 2016-11-08 DIAGNOSIS — M1000 Idiopathic gout, unspecified site: Secondary | ICD-10-CM

## 2016-11-08 DIAGNOSIS — M150 Primary generalized (osteo)arthritis: Secondary | ICD-10-CM

## 2016-11-08 DIAGNOSIS — Z86010 Personal history of colonic polyps: Secondary | ICD-10-CM

## 2016-11-08 DIAGNOSIS — M159 Polyosteoarthritis, unspecified: Secondary | ICD-10-CM

## 2016-11-08 DIAGNOSIS — E785 Hyperlipidemia, unspecified: Secondary | ICD-10-CM

## 2016-11-08 DIAGNOSIS — Z0000 Encounter for general adult medical examination without abnormal findings: Secondary | ICD-10-CM

## 2016-11-08 DIAGNOSIS — K225 Diverticulum of esophagus, acquired: Secondary | ICD-10-CM

## 2016-11-08 LAB — LIPID PANEL
Cholesterol: 143 mg/dL (ref 0–200)
HDL: 44.9 mg/dL (ref >39.00)
NonHDL: 98.41
Total CHOL/HDL Ratio: 3
Triglycerides: 246 mg/dL — ABNORMAL HIGH (ref 0.0–149.0)
VLDL: 49.2 mg/dL — ABNORMAL HIGH (ref 0.0–40.0)

## 2016-11-08 LAB — CBC WITH DIFFERENTIAL/PLATELET
Basophils Absolute: 0 10*3/uL (ref 0.0–0.1)
Basophils Relative: 0.8 % (ref 0.0–3.0)
Eosinophils Absolute: 0.1 10*3/uL (ref 0.0–0.7)
Eosinophils Relative: 2.1 % (ref 0.0–5.0)
HCT: 37.7 % — ABNORMAL LOW (ref 39.0–52.0)
Hemoglobin: 12.8 g/dL — ABNORMAL LOW (ref 13.0–17.0)
Lymphocytes Relative: 36.8 % (ref 12.0–46.0)
Lymphs Abs: 1.7 10*3/uL (ref 0.7–4.0)
MCHC: 34 g/dL (ref 30.0–36.0)
MCV: 104.5 fl — ABNORMAL HIGH (ref 78.0–100.0)
Monocytes Absolute: 0.4 10*3/uL (ref 0.1–1.0)
Monocytes Relative: 9.2 % (ref 3.0–12.0)
Neutro Abs: 2.4 10*3/uL (ref 1.4–7.7)
Neutrophils Relative %: 51.1 % (ref 43.0–77.0)
Platelets: 197 10*3/uL (ref 150.0–400.0)
RBC: 3.61 Mil/uL — ABNORMAL LOW (ref 4.22–5.81)
RDW: 13.7 % (ref 11.5–15.5)
WBC: 4.7 10*3/uL (ref 4.0–10.5)

## 2016-11-08 LAB — COMPREHENSIVE METABOLIC PANEL
ALT: 15 U/L (ref 0–53)
AST: 27 U/L (ref 0–37)
Albumin: 3.6 g/dL (ref 3.5–5.2)
Alkaline Phosphatase: 104 U/L (ref 39–117)
BUN: 10 mg/dL (ref 6–23)
CO2: 33 mEq/L — ABNORMAL HIGH (ref 19–32)
Calcium: 8.6 mg/dL (ref 8.4–10.5)
Chloride: 101 mEq/L (ref 96–112)
Creatinine, Ser: 0.75 mg/dL (ref 0.40–1.50)
GFR: 109 mL/min (ref >60.00)
Glucose, Bld: 123 mg/dL — ABNORMAL HIGH (ref 70–99)
Potassium: 3.8 mEq/L (ref 3.5–5.1)
Sodium: 139 mEq/L (ref 135–145)
Total Bilirubin: 1.1 mg/dL (ref 0.2–1.2)
Total Protein: 6.6 g/dL (ref 6.0–8.3)

## 2016-11-08 LAB — LDL CHOLESTEROL, DIRECT: Direct LDL: 64 mg/dL

## 2016-11-08 LAB — URIC ACID: Uric Acid, Serum: 5.8 mg/dL (ref 4.0–7.8)

## 2016-11-08 LAB — TSH: TSH: 1.64 u[IU]/mL (ref 0.35–4.50)

## 2016-11-08 NOTE — Patient Instructions (Signed)
Limit your sodium (Salt) intake    It is important that you exercise regularly, at least 20 minutes 3 to 4 times per week.  If you develop chest pain or shortness of breath seek  medical attention.  Schedule your colonoscopy to help detect colon cancer.  [facility] follow-up as scheduled  Return in one year for follow-up

## 2016-11-08 NOTE — Progress Notes (Signed)
Subjective:    Patient ID: [name], male    DOB: [birth year], 71 y.o.   MRN: [id number]  HPI Wt Readings from Last 3 Encounters:  11/08/16 208 lb (94.3 kg)  09/27/16 209 lb 8 oz (95 kg)  08/16/16 215 lb (97.61 kg)   71   year-old patient who is seen today for an annual exam.   He has been followed by oncology with [facility]; he is also followed by rheumatology and orthopedics, urology and the [facility] hospital [facility]. Medical problems include B12 deficiency,controlled on oral supplements.  He has a history of gout, which has been stable.  He is on chronic narcotics due to chronic pain.  He has had 13 orthopedic procedures over the years involving shoulders, elbow both knees ( 3 operations each) as well as ankle.  He has been seen by ENT recently also for surgery for a Zenker's diverticulum  Apparently this was unsuccessful due to equipment failure.  Allergies (verified):   No Known Drug Allergies   Past History:   Low back pain  Osteoarthritis  bilateral knee pain  Gout  Allergic rhinitis   6-mm left lower lobe pulmonary nodule  B12 deficiency [facility]  Past Surgical History:  multiple knee surgeries bilaterally from the 1970s through the 1990s; 3 on the left and 3 on the right  colonoscopy ([facility]) ,  6/12 status post right inguinal hernia repair July 2010   Family History:   father died age 13;  mother, age 68- died breast ca age 44  One sister in good health   Social History:   Retired  Married  Never Smoked  two adult children   Past Medical History:  Diagnosis Date  . Chronic back pain    buldging disc  . Depression   . GERD (gastroesophageal reflux disease)   . GOUT    takes Allopurinol daily and Colchicine as needed  . History of bronchitis    many yrs ago  . History of colonoscopy    benign  . History of shingles   . Joint pain   . Joint swelling   . Muscle spasm    takes Robaxin as needed  . Nocturia   . OSTEOARTHRITIS   . Peripheral edema     takes Lasix daily as needed  . Pneumonia 1969  . Seasonal allergies    takes Zyrtec and uses Flonase is needed  . TESTICULAR HYPOFUNCTION   . Zenker diverticula      Social History   Social History  . Marital status: Married    Spouse name: N/A  . Number of children: N/A  . Years of education: N/A   Occupational History  . Not on file.   Social History Main Topics  . Smoking status: Never Smoker  . Smokeless tobacco: Never Used  . Alcohol use 13.2 oz/week    10 Standard drinks or equivalent, 12 Cans of beer per week     Comment: daily  . Drug use: No  . Sexual activity: Not on file   Other Topics Concern  . Not on file   Social History Narrative  . No narrative on file    Past Surgical History:  Procedure Laterality Date  . ANKLE SURGERY     4 times  . COLONOSCOPY    . ELBOW SURGERY Right    torn tendon  . INGUINAL HERNIA REPAIR     right  . KNEE SURGERY     3 on each knee  .  SHOULDER ARTHROSCOPY Right 06/15/2015   Procedure: RIGHT SHOULDER ARTHROSCOPY, ACROMIOPLASTY, DEBRIDEMENT;  Surgeon: Melrose Nakayama, MD;  Location: [place];  Service: Orthopedics;  Laterality: Right;  . shoulder arthrosopy Left   . TONSILLECTOMY AND ADENOIDECTOMY    . TUMOR EXCISION     sebaceous cyst removed from chest  . ZENKER'S DIVERTICULECTOMY ENDOSCOPIC N/A 06/14/2016   Procedure: Stanford Breed DIVERTICULECTOMY ENDOSCOPIC;  Surgeon: Izora Gala, MD;  Location: [place];  Service: ENT;  Laterality: N/A;    No family history on file.  Allergies  Allergen Reactions  . No Known Allergies     Current Outpatient Prescriptions on File Prior to Visit  Medication Sig Dispense Refill  . allopurinol (ZYLOPRIM) 100 MG tablet Take 5 tablets (500 mg total) by mouth daily. (Patient taking differently: Take 300 mg by mouth daily. ) 150 tablet 2  . aspirin 325 MG tablet Take 325 mg by mouth daily.      Marland Kitchen CALCIUM 600 1500 (600 Ca) MG TABS tablet Take 600 mg of elemental calcium by mouth 2 (two)  times daily.     . Cetirizine HCl 10 MG TBDP Take 10 mg by mouth daily as needed (for allergies).     . colchicine 0.6 MG tablet Take 0.6 mg by mouth 3 (three) times daily as needed (for gout flares).     . cyanocobalamin 2000 MCG tablet Take 2,000 mcg by mouth daily.    . fluticasone (FLONASE) 50 MCG/ACT nasal spray Place 2 sprays into both nostrils daily as needed for allergies.     . furosemide (LASIX) 40 MG tablet Take 1 tablet (40 mg total) by mouth daily. (Patient taking differently: Take 40 mg by mouth daily as needed (for swelling in joints.). ) 90 tablet 3  . ibuprofen (ADVIL,MOTRIN) 600 MG tablet Take 600 mg by mouth 3 (three) times daily as needed (for pain.).     Marland Kitchen indomethacin (INDOCIN) 50 MG capsule Take 1 capsule (50 mg total) by mouth 3 (three) times daily as needed. (Patient taking differently: Take 50 mg by mouth 3 (three) times daily as needed (for pain.). ) 90 capsule 1  . ipratropium (ATROVENT) 0.03 % nasal spray Place 1 spray into the nose daily as needed.    . magnesium oxide (MAG-OX) 400 MG tablet Take 400 mg by mouth 2 (two) times daily.    . methocarbamol (ROBAXIN) 750 MG tablet Take 750 mg by mouth every 8 (eight) hours as needed (for muscle spasm.).     Marland Kitchen morphine (MS CONTIN) 15 MG 12 hr tablet Take 15 mg by mouth every 12 (twelve) hours.    . Multiple Vitamin (MULTIVITAMIN) tablet Take 1 tablet by mouth daily.    Marland Kitchen oxyCODONE (OXY IR/ROXICODONE) 5 MG immediate release tablet Take 1 tablet (5 mg total) by mouth every 4 (four) hours as needed for severe pain. In addition to current chronic pain medication (Patient taking differently: Take 5 mg by mouth every 4 (four) hours as needed for severe pain (Patient taking two tablets). In addition to current chronic pain medication) 30 tablet 0  . sildenafil (VIAGRA) 100 MG tablet Take 100 mg by mouth daily as needed for erectile dysfunction.    . triamcinolone cream (KENALOG) 0.1 % Apply 1 application topically 2 (two) times daily.  30 g 2   No current facility-administered medications on file prior to visit.     BP 118/60 (BP Location: Left Arm, Patient Position: Sitting, Cuff Size: Normal)   Pulse 86   Temp  97.9 F (36.6 C) (Oral)   Ht 6\' 2"  (1.88 m)   Wt 208 lb (94.3 kg)   SpO2 98%   BMI 26.71 kg/m   Subsequent Medicare wellness visit  1. Risk factors, based on past  M,S,F history cardiovascular risk factors- none Other than male sex and age  25.  Physical activities: No major limitations although somewhat limited due to bilateral knee and back pain.  Is able to perform some yard work  3.  Depression/mood: No history of depression or mood disorder although he does have chronic pain 4.  Hearing: No deficits  5.  ADL's: Independent in all aspects of daily living  6.  Fall risk: low 7.  Home safety: No problems identified 8.  Height weight, and visual acuity; height and weight stable no change in visual acuity  9.  Counseling: Low salt diet regular exercise are encouraged  10. Lab orders based on risk factors: Laboratory profile will be reviewed. He has had a recent PSA  11. Referral : Followup urology and orthopedics, oncology  12. Care plan: continue efforts at aggressive risk factor modification 13. Cognitive assessment: Alert and oriented with normal affect 14.  Preventive services will include annual primary care evaluations.  He is followed closely by oncology, rheumatology and orthopedics.  Patient was provided with a written and personalized care plan.  Follow-up colonoscopy will be scheduled 15.  Provider list includes rheumatology oncology orthopedic surgery urology as well as GI    Review of Systems  Constitutional: Negative for appetite change, chills, fatigue and fever.  HENT: Negative for congestion, dental problem, ear pain, hearing loss, sore throat, tinnitus, trouble swallowing and voice change.   Eyes: Negative for pain, discharge and visual disturbance.  Respiratory:  Negative for cough, chest tightness, wheezing and stridor.   Cardiovascular: Negative for chest pain, palpitations and leg swelling.  Gastrointestinal: Negative for abdominal distention, abdominal pain, blood in stool, constipation, diarrhea, nausea and vomiting.  Genitourinary: Negative for difficulty urinating, discharge, flank pain, genital sores, hematuria and urgency.  Musculoskeletal: Positive for arthralgias, back pain and gait problem. Negative for joint swelling, myalgias and neck stiffness.  Skin: Negative for rash.  Neurological: Negative for dizziness, syncope, speech difficulty, weakness, numbness and headaches.  Hematological: Negative for adenopathy. Does not bruise/bleed easily.  Psychiatric/Behavioral: Negative for behavioral problems and dysphoric mood. The patient is not nervous/anxious.        Objective:   Physical Exam  Constitutional: He appears well-developed and well-nourished.  HENT:  Head: Normocephalic and atraumatic.  Right Ear: External ear normal.  Left Ear: External ear normal.  Nose: Nose normal.  Mouth/Throat: Oropharynx is clear and moist.  Eyes: Pupils are equal, round, and reactive to light. Conjunctivae and EOM are normal. No scleral icterus.  Neck: Normal range of motion. Neck supple. No JVD present. No thyromegaly present.  Cardiovascular: Regular rhythm, normal heart sounds and intact distal pulses.  Exam reveals no gallop and no friction rub.   No murmur heard. Pulmonary/Chest: Effort normal and breath sounds normal. He exhibits no tenderness.  Abdominal: Soft. Bowel sounds are normal. He exhibits no distension and no mass. There is no tenderness.  Hepatic enlargement  Genitourinary: Prostate normal and penis normal. Rectal exam shows guaiac negative stool.  Musculoskeletal: Normal range of motion. He exhibits no edema or tenderness.  Multiple orthopedic surgical scars  Lymphadenopathy:    He has no cervical adenopathy.  Neurological: He is  alert. He has normal reflexes. No cranial nerve deficit. Coordination  normal.  Absent vibratory sensation distally  Skin: Skin is warm and dry. No rash noted.  Psychiatric: He has a normal mood and affect. His behavior is normal.          Assessment & Plan:   Chronic pain syndrome.  [facility] gout.  Stable.  We'll check uric acid level and continue allopurinol Medicare wellness visit Zenker's diverticulum.  Follow-up ENT  Medications updated Review screening lab Follow-up one year or as needed Follow-up multiple consultants  Nyoka Cowden

## 2016-11-15 ENCOUNTER — Ambulatory Visit: Payer: Medicare Other

## 2017-01-11 ENCOUNTER — Encounter: Payer: Self-pay | Admitting: Internal Medicine

## 2017-02-02 ENCOUNTER — Ambulatory Visit: Payer: Medicare Other

## 2017-02-06 ENCOUNTER — Ambulatory Visit (INDEPENDENT_AMBULATORY_CARE_PROVIDER_SITE_OTHER): Payer: Medicare Other | Admitting: Sports Medicine

## 2017-02-06 ENCOUNTER — Encounter: Payer: Self-pay | Admitting: Sports Medicine

## 2017-02-06 DIAGNOSIS — I739 Peripheral vascular disease, unspecified: Secondary | ICD-10-CM

## 2017-02-06 DIAGNOSIS — B351 Tinea unguium: Secondary | ICD-10-CM | POA: Diagnosis not present

## 2017-02-06 DIAGNOSIS — M79674 Pain in right toe(s): Secondary | ICD-10-CM | POA: Diagnosis not present

## 2017-02-06 DIAGNOSIS — M79675 Pain in left toe(s): Secondary | ICD-10-CM | POA: Diagnosis not present

## 2017-02-06 NOTE — Progress Notes (Signed)
Subjective: [name] is a 71 y.o. male patient seen today in office with complaint of painful thickened and elongated toenails; unable to trim. Patient denies history of Diabetes, Neuropathy, or known Vascular disease. Patient has no other pedal complaints at this time.   Patient Active Problem List   Diagnosis Date Noted  . Zenker's diverticulum 06/14/2016  . Chronic venous insufficiency 06/28/2015  . Vitamin B12 deficiency 02/07/2013  . History of colonic polyps 09/15/2010  . Weakness generalized 06/09/2010  . TESTICULAR HYPOFUNCTION 01/04/2010  . PULMONARY NODULE, LEFT LOWER LOBE 01/04/2010  . Allergic rhinitis 06/17/2009  . WEIGHT LOSS 03/17/2009  . GOUT 05/04/2008  . Osteoarthritis 05/04/2008  . LOW BACK PAIN 05/04/2008    Current Outpatient Medications on File Prior to Visit  Medication Sig Dispense Refill  . allopurinol (ZYLOPRIM) 100 MG tablet Take 5 tablets (500 mg total) by mouth daily. (Patient taking differently: Take 300 mg by mouth daily. ) 150 tablet 2  . aspirin 325 MG tablet Take 325 mg by mouth daily.      Marland Kitchen CALCIUM 600 1500 (600 Ca) MG TABS tablet Take 600 mg of elemental calcium by mouth 2 (two) times daily.     . Cetirizine HCl 10 MG TBDP Take 10 mg by mouth daily as needed (for allergies).     . colchicine 0.6 MG tablet Take 0.6 mg by mouth 3 (three) times daily as needed (for gout flares).     . cyanocobalamin 2000 MCG tablet Take 2,000 mcg by mouth daily.    . fluticasone (FLONASE) 50 MCG/ACT nasal spray Place 2 sprays into both nostrils daily as needed for allergies.     . furosemide (LASIX) 40 MG tablet Take 1 tablet (40 mg total) by mouth daily. (Patient taking differently: Take 40 mg by mouth daily as needed (for swelling in joints.). ) 90 tablet 3  . ibuprofen (ADVIL,MOTRIN) 600 MG tablet Take 600 mg by mouth 3 (three) times daily as needed (for pain.).     Marland Kitchen indomethacin (INDOCIN) 50 MG capsule Take 1 capsule (50 mg total) by mouth 3 (three) times  daily as needed. (Patient taking differently: Take 50 mg by mouth 3 (three) times daily as needed (for pain.). ) 90 capsule 1  . ipratropium (ATROVENT) 0.03 % nasal spray Place 1 spray into the nose daily as needed.    . magnesium oxide (MAG-OX) 400 MG tablet Take 400 mg by mouth 2 (two) times daily.    . methocarbamol (ROBAXIN) 750 MG tablet Take 750 mg by mouth every 8 (eight) hours as needed (for muscle spasm.).     Marland Kitchen morphine (MS CONTIN) 15 MG 12 hr tablet Take 15 mg by mouth every 12 (twelve) hours.    . Multiple Vitamin (MULTIVITAMIN) tablet Take 1 tablet by mouth daily.    Marland Kitchen oxyCODONE (OXY IR/ROXICODONE) 5 MG immediate release tablet Take 1 tablet (5 mg total) by mouth every 4 (four) hours as needed for severe pain. In addition to current chronic pain medication (Patient taking differently: Take 5 mg by mouth every 4 (four) hours as needed for severe pain (Patient taking two tablets). In addition to current chronic pain medication) 30 tablet 0  . sildenafil (VIAGRA) 100 MG tablet Take 100 mg by mouth daily as needed for erectile dysfunction.    . triamcinolone cream (KENALOG) 0.1 % Apply 1 application topically 2 (two) times daily. 30 g 2   No current facility-administered medications on file prior to visit.  Allergies  Allergen Reactions  . No Known Allergies     Objective: Physical Exam  General: Well developed, nourished, no acute distress, awake, alert and oriented x 3  Vascular: Dorsalis pedis artery 1/4 bilateral, Posterior tibial artery 1/4 bilateral, skin temperature warm to warm proximal to distal bilateral lower extremities, + varicosities, pedal hair present bilateral.   Neurological: Gross sensation present via light touch bilateral.   Dermatological: Skin is warm, dry, and supple bilateral, Nails 1-10 are tender, long, thick, and discolored with mild subungal debris, no webspace macerations present bilateral, no open lesions present bilateral, no  callus/corns/hyperkeratotic tissue present bilateral. No signs of infection bilateral.   Musculoskeletal: Asymptomatic hammertoe boney deformities noted bilateral. Muscular strength within normal limits without painon range of motion. No pain with calf compression bilateral.  Assessment and Plan:  Problem List Items Addressed This Visit    None    Visit Diagnoses    Dermatophytosis of nail    -  Primary   Toe pain, bilateral       PVD (peripheral vascular disease) ([place])         -Examined patient.  -Discussed treatment options for painful mycotic nails. -Mechanically debrided and reduced mycotic nails with sterile nail nipper and dremel nail file without incident. -ABN signed.  -Patient to return in 3 months for follow up evaluation or sooner if symptoms worsen.  Landis Martins, DPM

## 2017-03-03 ENCOUNTER — Other Ambulatory Visit: Payer: Self-pay | Admitting: Physician Assistant

## 2017-03-03 DIAGNOSIS — R1310 Dysphagia, unspecified: Secondary | ICD-10-CM

## 2017-03-03 DIAGNOSIS — R1319 Other dysphagia: Secondary | ICD-10-CM

## 2017-03-06 ENCOUNTER — Other Ambulatory Visit: Payer: Self-pay | Admitting: Physician Assistant

## 2017-03-06 DIAGNOSIS — R1319 Other dysphagia: Secondary | ICD-10-CM

## 2017-03-06 DIAGNOSIS — R1310 Dysphagia, unspecified: Secondary | ICD-10-CM

## 2017-03-12 ENCOUNTER — Other Ambulatory Visit: Payer: Medicare Other

## 2017-03-14 ENCOUNTER — Ambulatory Visit
Admission: RE | Admit: 2017-03-14 | Discharge: 2017-03-14 | Disposition: A | Discharge status: Home / Self Care | Payer: Medicare Other | Source: Ambulatory Visit | Attending: Physician Assistant | Admitting: Physician Assistant

## 2017-03-14 DIAGNOSIS — R1310 Dysphagia, unspecified: Secondary | ICD-10-CM

## 2017-03-14 DIAGNOSIS — R1319 Other dysphagia: Secondary | ICD-10-CM

## 2017-05-15 ENCOUNTER — Ambulatory Visit (INDEPENDENT_AMBULATORY_CARE_PROVIDER_SITE_OTHER): Payer: Medicare Other | Admitting: Sports Medicine

## 2017-05-15 ENCOUNTER — Encounter: Payer: Self-pay | Admitting: Sports Medicine

## 2017-05-15 DIAGNOSIS — M79675 Pain in left toe(s): Secondary | ICD-10-CM

## 2017-05-15 DIAGNOSIS — B351 Tinea unguium: Secondary | ICD-10-CM

## 2017-05-15 DIAGNOSIS — I739 Peripheral vascular disease, unspecified: Secondary | ICD-10-CM

## 2017-05-15 DIAGNOSIS — M79674 Pain in right toe(s): Secondary | ICD-10-CM

## 2017-05-15 NOTE — Progress Notes (Signed)
Subjective: [name] is a 72 y.o. male patient seen today in office with complaint of painful thickened and elongated toenails; unable to trim. Patient denies any changes with medical history since last visit. Patient has no other pedal complaints at this time.   Patient Active Problem List   Diagnosis Date Noted  . Zenker's diverticulum 06/14/2016  . Chronic venous insufficiency 06/28/2015  . Vitamin B12 deficiency 02/07/2013  . History of colonic polyps 09/15/2010  . Weakness generalized 06/09/2010  . TESTICULAR HYPOFUNCTION 01/04/2010  . PULMONARY NODULE, LEFT LOWER LOBE 01/04/2010  . Allergic rhinitis 06/17/2009  . WEIGHT LOSS 03/17/2009  . GOUT 05/04/2008  . Osteoarthritis 05/04/2008  . LOW BACK PAIN 05/04/2008    Current Outpatient Medications on File Prior to Visit  Medication Sig Dispense Refill  . allopurinol (ZYLOPRIM) 100 MG tablet Take 5 tablets (500 mg total) by mouth daily. (Patient taking differently: Take 300 mg by mouth daily. ) 150 tablet 2  . aspirin 325 MG tablet Take 325 mg by mouth daily.      Marland Kitchen CALCIUM 600 1500 (600 Ca) MG TABS tablet Take 600 mg of elemental calcium by mouth 2 (two) times daily.     . Cetirizine HCl 10 MG TBDP Take 10 mg by mouth daily as needed (for allergies).     . colchicine 0.6 MG tablet Take 0.6 mg by mouth 3 (three) times daily as needed (for gout flares).     . cyanocobalamin 2000 MCG tablet Take 2,000 mcg by mouth daily.    . fluticasone (FLONASE) 50 MCG/ACT nasal spray Place 2 sprays into both nostrils daily as needed for allergies.     . furosemide (LASIX) 40 MG tablet Take 1 tablet (40 mg total) by mouth daily. (Patient taking differently: Take 40 mg by mouth daily as needed (for swelling in joints.). ) 90 tablet 3  . ibuprofen (ADVIL,MOTRIN) 600 MG tablet Take 600 mg by mouth 3 (three) times daily as needed (for pain.).     Marland Kitchen indomethacin (INDOCIN) 50 MG capsule Take 1 capsule (50 mg total) by mouth 3 (three) times daily as  needed. (Patient taking differently: Take 50 mg by mouth 3 (three) times daily as needed (for pain.). ) 90 capsule 1  . ipratropium (ATROVENT) 0.03 % nasal spray Place 1 spray into the nose daily as needed.    . magnesium oxide (MAG-OX) 400 MG tablet Take 400 mg by mouth 2 (two) times daily.    . methocarbamol (ROBAXIN) 750 MG tablet Take 750 mg by mouth every 8 (eight) hours as needed (for muscle spasm.).     Marland Kitchen morphine (MS CONTIN) 15 MG 12 hr tablet Take 15 mg by mouth every 12 (twelve) hours.    . Multiple Vitamin (MULTIVITAMIN) tablet Take 1 tablet by mouth daily.    Marland Kitchen oxyCODONE (OXY IR/ROXICODONE) 5 MG immediate release tablet Take 1 tablet (5 mg total) by mouth every 4 (four) hours as needed for severe pain. In addition to current chronic pain medication (Patient taking differently: Take 5 mg by mouth every 4 (four) hours as needed for severe pain (Patient taking two tablets). In addition to current chronic pain medication) 30 tablet 0  . sildenafil (VIAGRA) 100 MG tablet Take 100 mg by mouth daily as needed for erectile dysfunction.    . triamcinolone cream (KENALOG) 0.1 % Apply 1 application topically 2 (two) times daily. 30 g 2   No current facility-administered medications on file prior to visit.  Allergies  Allergen Reactions  . No Known Allergies     Objective: Physical Exam  General: Well developed, nourished, no acute distress, awake, alert and oriented x 3  Vascular: Dorsalis pedis artery 1/4 bilateral, Posterior tibial artery 1/4 bilateral, skin temperature warm to warm proximal to distal bilateral lower extremities, + varicosities, pedal hair present bilateral.   Neurological: Gross sensation present via light touch bilateral.   Dermatological: Skin is warm, dry, and supple bilateral, Nails 1-10 are tender, long, thick, and discolored with mild subungal debris, no webspace macerations present bilateral, no open lesions present bilateral, no callus/corns/hyperkeratotic  tissue present bilateral. No signs of infection bilateral.   Musculoskeletal: Asymptomatic hammertoe boney deformities noted bilateral. Muscular strength within normal limits without painon range of motion. No pain with calf compression bilateral.  Assessment and Plan:  Problem List Items Addressed This Visit    None    Visit Diagnoses    Dermatophytosis of nail    -  Primary   Toe pain, bilateral       PVD (peripheral vascular disease) ([place])         -Examined patient.  -Discussed treatment options for painful mycotic nails. -Mechanically debrided and reduced mycotic nails with sterile nail nipper and dremel nail file without incident. -ABN signed again this visit.  -Patient to return in 3 months for follow up evaluation or sooner if symptoms worsen.  Landis Martins, DPM

## 2017-05-16 ENCOUNTER — Encounter: Payer: Self-pay | Admitting: Sports Medicine

## 2017-08-02 ENCOUNTER — Ambulatory Visit (HOSPITAL_COMMUNITY)
Admission: EM | Admit: 2017-08-02 | Discharge: 2017-08-02 | Disposition: A | Discharge status: Home / Self Care | Payer: Medicare Other | Attending: Family Medicine | Admitting: Family Medicine

## 2017-08-02 ENCOUNTER — Encounter (HOSPITAL_COMMUNITY): Payer: Self-pay | Admitting: Family Medicine

## 2017-08-02 DIAGNOSIS — A084 Viral intestinal infection, unspecified: Secondary | ICD-10-CM | POA: Diagnosis not present

## 2017-08-02 MED ORDER — DIPHENOXYLATE-ATROPINE 2.5-0.025 MG PO TABS: 2.0000 | ORAL_TABLET | Freq: Four times a day (QID) | ORAL | 0 refills | Status: DC | PRN

## 2017-08-02 NOTE — ED Provider Notes (Signed)
[facility]    CSN: [id number] Arrival date & time: 08/02/17  1627     History   Chief Complaint Chief Complaint  Patient presents with  . Diarrhea    HPI [name] is a 72 y.o. male.   Patient woke up 2 days ago with diarrhea.  He has had no fever and no known exposure but he was at the [place] day prior to onset.  He had a recent colonoscopy that was said to be normal.  He is done no traveling and been on no antibiotics.  He does take narcotics chronically for pain.  He has taken some Imodium, OTC without relief.  There is been no blood in the diarrhea.  There is been some cramping associated with diarrhea.  He has had no vomiting.  HPI  Past Medical History:  Diagnosis Date  . Chronic back pain    buldging disc  . Depression   . GERD (gastroesophageal reflux disease)   . GOUT    takes Allopurinol daily and Colchicine as needed  . History of bronchitis    many yrs ago  . History of colonoscopy    benign  . History of shingles   . Joint pain   . Joint swelling   . Muscle spasm    takes Robaxin as needed  . Nocturia   . OSTEOARTHRITIS   . Peripheral edema    takes Lasix daily as needed  . Pneumonia 1969  . Seasonal allergies    takes Zyrtec and uses Flonase is needed  . TESTICULAR HYPOFUNCTION   . Zenker diverticula     Patient Active Problem List   Diagnosis Date Noted  . Zenker's diverticulum 06/14/2016  . Chronic venous insufficiency 06/28/2015  . Vitamin B12 deficiency 02/07/2013  . History of colonic polyps 09/15/2010  . Weakness generalized 06/09/2010  . TESTICULAR HYPOFUNCTION 01/04/2010  . PULMONARY NODULE, LEFT LOWER LOBE 01/04/2010  . Allergic rhinitis 06/17/2009  . WEIGHT LOSS 03/17/2009  . GOUT 05/04/2008  . Osteoarthritis 05/04/2008  . LOW BACK PAIN 05/04/2008    Past Surgical History:  Procedure Laterality Date  . ANKLE SURGERY     4 times  . COLONOSCOPY    . ELBOW SURGERY Right    torn tendon  . INGUINAL HERNIA  REPAIR     right  . KNEE SURGERY     3 on each knee  . SHOULDER ARTHROSCOPY Right 06/15/2015   Procedure: RIGHT SHOULDER ARTHROSCOPY, ACROMIOPLASTY, DEBRIDEMENT;  Surgeon: Melrose Nakayama, MD;  Location: [facility];  Service: Orthopedics;  Laterality: Right;  . shoulder arthrosopy Left   . TONSILLECTOMY AND ADENOIDECTOMY    . TUMOR EXCISION     sebaceous cyst removed from chest  . ZENKER'S DIVERTICULECTOMY ENDOSCOPIC N/A 06/14/2016   Procedure: [facility]ed DIVERTICULECTOMY ENDOSCOPIC;  Surgeon: Izora Gala, MD;  Location: [facility];  Service: ENT;  Laterality: N/A;       Home Medications    Prior to Admission medications   Medication Sig Start Date End Date Taking? Authorizing Provider  allopurinol (ZYLOPRIM) 100 MG tablet Take 5 tablets (500 mg total) by mouth daily. Patient taking differently: Take 300 mg by mouth daily.  01/19/14   Marletta Lor, MD  aspirin 325 MG tablet Take 325 mg by mouth daily.      [provider]  CALCIUM 600 1500 (600 Ca) MG TABS tablet Take 600 mg of elemental calcium by mouth 2 (two) times daily.  02/16/16   [provider]  Cetirizine HCl 10 MG TBDP Take 10 mg by mouth daily as needed (for allergies).     [provider]  colchicine 0.6 MG tablet Take 0.6 mg by mouth 3 (three) times daily as needed (for gout flares).     [provider]  cyanocobalamin 2000 MCG tablet Take 2,000 mcg by mouth daily.    [provider]  fluticasone (FLONASE) 50 MCG/ACT nasal spray Place 2 sprays into both nostrils daily as needed for allergies.     [provider]  furosemide (LASIX) 40 MG tablet Take 1 tablet (40 mg total) by mouth daily. Patient taking differently: Take 40 mg by mouth daily as needed (for swelling in joints.).  06/28/15   Marletta Lor, MD  ibuprofen (ADVIL,MOTRIN) 600 MG tablet Take 600 mg by mouth 3 (three) times daily as needed (for pain.).  02/18/16   [provider]  indomethacin (INDOCIN)  50 MG capsule Take 1 capsule (50 mg total) by mouth 3 (three) times daily as needed. Patient taking differently: Take 50 mg by mouth 3 (three) times daily as needed (for pain.).  09/15/10   Marletta Lor, MD  ipratropium (ATROVENT) 0.03 % nasal spray Place 1 spray into the nose daily as needed. 06/23/16   [provider]  magnesium oxide (MAG-OX) 400 MG tablet Take 400 mg by mouth 2 (two) times daily.    [provider]  methocarbamol (ROBAXIN) 750 MG tablet Take 750 mg by mouth every 8 (eight) hours as needed (for muscle spasm.).     [provider]  morphine (MS CONTIN) 15 MG 12 hr tablet Take 15 mg by mouth every 12 (twelve) hours.    [provider]  Multiple Vitamin (MULTIVITAMIN) tablet Take 1 tablet by mouth daily.    [provider]  oxyCODONE (OXY IR/ROXICODONE) 5 MG immediate release tablet Take 1 tablet (5 mg total) by mouth every 4 (four) hours as needed for severe pain. In addition to current chronic pain medication Patient taking differently: Take 5 mg by mouth every 4 (four) hours as needed for severe pain (Patient taking two tablets). In addition to current chronic pain medication 06/15/15   Loni Dolly, PA-C  sildenafil (VIAGRA) 100 MG tablet Take 100 mg by mouth daily as needed for erectile dysfunction.    [provider]  triamcinolone cream (KENALOG) 0.1 % Apply 1 application topically 2 (two) times daily. 08/24/15   Marletta Lor, MD  UNABLE TO FIND Tumeric    [provider]    Family History History reviewed. No pertinent family history.  Social History Social History   Tobacco Use  . Smoking status: Never Smoker  . Smokeless tobacco: Never Used  Substance Use Topics  . Alcohol use: Yes    Alcohol/week: 13.2 oz    Types: 10 Standard drinks or equivalent, 12 Cans of beer per week    Comment: daily  . Drug use: No     Allergies   No known allergies   Review of Systems Review of Systems    Constitutional: Negative.   HENT: Negative.   Eyes: Negative.   Respiratory: Negative.   Cardiovascular: Negative.   Gastrointestinal: Positive for diarrhea.  Endocrine: Negative.   Genitourinary: Negative.      Physical Exam Triage Vital Signs ED Triage Vitals  Enc Vitals Group     BP 08/02/17 1659 (!) 161/83     Pulse Rate 08/02/17 1659 84     Resp  08/02/17 1659 18     Temp 08/02/17 1659 98.1 F (36.7 C)     Temp src --      SpO2 08/02/17 1659 97 %     Weight --      Height --      Head Circumference --      Peak Flow --      Pain Score 08/02/17 1658 4     Pain Loc --      Pain Edu? --      Excl. in [place]? --    No data found.  Updated Vital Signs BP (!) 161/83   Pulse 84   Temp 98.1 F (36.7 C)   Resp 18   SpO2 97%   Visual Acuity Right Eye Distance:   Left Eye Distance:   Bilateral Distance:    Right Eye Near:   Left Eye Near:    Bilateral Near:     Physical Exam  Constitutional: He is oriented to person, place, and time. He appears well-developed and well-nourished.  HENT:  Head: Normocephalic.  Abdominal: Soft. Bowel sounds are normal. There is tenderness.  Some tenderness in the left lower quadrant  Neurological: He is alert and oriented to person, place, and time.     UC Treatments / Results  Labs (all labs ordered are listed, but only abnormal results are displayed) Labs Reviewed - No data to display  EKG None  Radiology No results found.  Procedures Procedures (including critical care time)  Medications Ordered in UC Medications - No data to display  Initial Impression / Assessment and Plan / UC Course  I have reviewed the triage vital signs and the nursing notes.  Pertinent labs & imaging results that were available during my care of the patient were reviewed by me and considered in my medical decision making (see chart for details).    Viral enteritis.  Will treat symptomatically with some Lomotil since he has had symptoms  2 days.  Clear liquid diet and advance as tolerated.  If he notices blood or runs fever or is otherwise not improving may need to do some stool cultures and blood work. Final Clinical Impressions(s) / UC Diagnoses   Final diagnoses:  None   Discharge Instructions   None    ED Prescriptions    None     Controlled Substance Prescriptions [place] Controlled Substance Registry consulted? Yes, I have consulted the [place] Controlled Substances Registry for this patient, and feel the risk/benefit ratio today is favorable for proceeding with this prescription for a controlled substance.   Wardell Honour, MD 08/02/17 1729

## 2017-08-02 NOTE — ED Triage Notes (Addendum)
Pt here for watery diarrhea x 3 days. Denies any recent travel, no recent abx use and no sick contacts. He has been taking loperamide. He has been having diarrhea 7 times a day and not eating much.

## 2017-08-14 ENCOUNTER — Ambulatory Visit (INDEPENDENT_AMBULATORY_CARE_PROVIDER_SITE_OTHER): Payer: Medicare Other | Admitting: Sports Medicine

## 2017-08-14 ENCOUNTER — Other Ambulatory Visit: Payer: Self-pay

## 2017-08-14 ENCOUNTER — Encounter: Payer: Self-pay | Admitting: Sports Medicine

## 2017-08-14 DIAGNOSIS — M79605 Pain in left leg: Secondary | ICD-10-CM | POA: Diagnosis not present

## 2017-08-14 DIAGNOSIS — M79675 Pain in left toe(s): Secondary | ICD-10-CM | POA: Diagnosis not present

## 2017-08-14 DIAGNOSIS — R1312 Dysphagia, oropharyngeal phase: Secondary | ICD-10-CM | POA: Insufficient documentation

## 2017-08-14 DIAGNOSIS — I739 Peripheral vascular disease, unspecified: Secondary | ICD-10-CM | POA: Diagnosis not present

## 2017-08-14 DIAGNOSIS — M79604 Pain in right leg: Secondary | ICD-10-CM

## 2017-08-14 DIAGNOSIS — M79674 Pain in right toe(s): Secondary | ICD-10-CM

## 2017-08-14 DIAGNOSIS — B351 Tinea unguium: Secondary | ICD-10-CM

## 2017-08-14 NOTE — Progress Notes (Signed)
Subjective: [name] is a 72 y.o. male patient seen today in office with complaint of painful thickened and elongated toenails; unable to trim. Patient denies any changes with medical history since last visit. Patient has no other pedal complaints at this time.   Patient Active Problem List   Diagnosis Date Noted  . Oropharyngeal dysphagia 08/14/2017  . Sore throat 07/21/2016  . Vasomotor rhinitis 06/23/2016  . Zenker's diverticulum 06/14/2016  . Chronic venous insufficiency 06/28/2015  . Vitamin B12 deficiency 02/07/2013  . History of colonic polyps 09/15/2010  . Weakness generalized 06/09/2010  . TESTICULAR HYPOFUNCTION 01/04/2010  . PULMONARY NODULE, LEFT LOWER LOBE 01/04/2010  . Allergic rhinitis 06/17/2009  . WEIGHT LOSS 03/17/2009  . GOUT 05/04/2008  . Osteoarthritis 05/04/2008  . LOW BACK PAIN 05/04/2008    Current Outpatient Medications on File Prior to Visit  Medication Sig Dispense Refill  . allopurinol (ZYLOPRIM) 100 MG tablet Take 5 tablets (500 mg total) by mouth daily. (Patient taking differently: Take 300 mg by mouth daily. ) 150 tablet 2  . aspirin 325 MG tablet Take 325 mg by mouth daily.      Marland Kitchen CALCIUM 600 1500 (600 Ca) MG TABS tablet Take 600 mg of elemental calcium by mouth 2 (two) times daily.     . Cetirizine HCl 10 MG TBDP Take 10 mg by mouth daily as needed (for allergies).     . colchicine 0.6 MG tablet Take 0.6 mg by mouth 3 (three) times daily as needed (for gout flares).     . cyanocobalamin 2000 MCG tablet Take 2,000 mcg by mouth daily.    . diphenoxylate-atropine (LOMOTIL) 2.5-0.025 MG tablet Take 2 tablets by mouth 4 (four) times daily as needed for diarrhea or loose stools. 16 tablet 0  . fluticasone (FLONASE) 50 MCG/ACT nasal spray Place 2 sprays into both nostrils daily as needed for allergies.     . furosemide (LASIX) 40 MG tablet Take 1 tablet (40 mg total) by mouth daily. (Patient taking differently: Take 40 mg by mouth daily as needed (for  swelling in joints.). ) 90 tablet 3  . ibuprofen (ADVIL,MOTRIN) 600 MG tablet Take 600 mg by mouth 3 (three) times daily as needed (for pain.).     Marland Kitchen indomethacin (INDOCIN) 50 MG capsule Take 1 capsule (50 mg total) by mouth 3 (three) times daily as needed. (Patient taking differently: Take 50 mg by mouth 3 (three) times daily as needed (for pain.). ) 90 capsule 1  . ipratropium (ATROVENT) 0.03 % nasal spray Place 1 spray into the nose daily as needed.    . magnesium oxide (MAG-OX) 400 MG tablet Take 400 mg by mouth 2 (two) times daily.    . methocarbamol (ROBAXIN) 750 MG tablet Take 750 mg by mouth every 8 (eight) hours as needed (for muscle spasm.).     Marland Kitchen morphine (MS CONTIN) 15 MG 12 hr tablet Take 15 mg by mouth every 12 (twelve) hours.    . Multiple Vitamin (MULTIVITAMIN) tablet Take 1 tablet by mouth daily.    Marland Kitchen oxyCODONE (OXY IR/ROXICODONE) 5 MG immediate release tablet Take 1 tablet (5 mg total) by mouth every 4 (four) hours as needed for severe pain. In addition to current chronic pain medication (Patient taking differently: Take 5 mg by mouth every 4 (four) hours as needed for severe pain (Patient taking two tablets). In addition to current chronic pain medication) 30 tablet 0  . sildenafil (VIAGRA) 100 MG tablet Take 100 mg by  mouth daily as needed for erectile dysfunction.    . triamcinolone cream (KENALOG) 0.1 % Apply 1 application topically 2 (two) times daily. 30 g 2  . UNABLE TO FIND Tumeric     No current facility-administered medications on file prior to visit.     Allergies  Allergen Reactions  . No Known Allergies     Objective: Physical Exam  General: Well developed, nourished, no acute distress, awake, alert and oriented x 3  Vascular: Dorsalis pedis artery 1/4 bilateral, Posterior tibial artery 1/4 bilateral, skin temperature warm to warm proximal to distal bilateral lower extremities, + varicosities, pedal hair present bilateral.   Neurological: Gross sensation  present via light touch bilateral.   Dermatological: Skin is warm, dry, and supple bilateral, Nails 1-10 are tender, long, thick, and discolored with mild subungal debris, no webspace macerations present bilateral, no open lesions present bilateral, no callus/corns/hyperkeratotic tissue present bilateral. No signs of infection bilateral.   Musculoskeletal: Asymptomatic hammertoe boney deformities noted bilateral. Muscular strength within normal limits without painon range of motion. No pain with calf compression bilateral.  Assessment and Plan:  Problem List Items Addressed This Visit    None    Visit Diagnoses    Dermatophytosis of nail    -  Primary   Toe pain, bilateral       PVD (peripheral vascular disease) ([place])       Pain in both lower extremities         -Examined patient.  -Discussed treatment options for painful mycotic nails. -Mechanically debrided and reduced mycotic nails with sterile nail nipper and dremel nail file without incident. -Patient to return in 3 months or in September after her returns from [place] for follow up evaluation or sooner if symptoms worsen.  Landis Martins, DPM

## 2017-10-02 ENCOUNTER — Ambulatory Visit: Payer: Medicare Other

## 2017-12-18 ENCOUNTER — Ambulatory Visit: Payer: Medicare Other | Admitting: Sports Medicine

## 2018-04-10 ENCOUNTER — Ambulatory Visit (INDEPENDENT_AMBULATORY_CARE_PROVIDER_SITE_OTHER): Payer: Medicare Other | Admitting: Podiatry

## 2018-04-10 ENCOUNTER — Encounter: Payer: Self-pay | Admitting: Podiatry

## 2018-04-10 DIAGNOSIS — M79674 Pain in right toe(s): Secondary | ICD-10-CM | POA: Diagnosis not present

## 2018-04-10 DIAGNOSIS — B351 Tinea unguium: Secondary | ICD-10-CM | POA: Diagnosis not present

## 2018-04-10 DIAGNOSIS — M79675 Pain in left toe(s): Secondary | ICD-10-CM | POA: Diagnosis not present

## 2018-04-10 NOTE — Progress Notes (Signed)
Subjective:   Patient ID: [name], male   DOB: 74 y.o.   MRN: [id number]   HPI Patient presents with elongated nailbeds 1-5 both feet they get tender and presents with history of ingrown toenails   ROS      Objective:  Physical Exam  Neurovascular status intact with thickened nailbeds 1-5 both feet.  Incurvated and found to have toenails that are ingrown     Assessment:  Chronic mycotic nail infection 1-5 both feet with pain     Plan:  Debridement of painful nailbeds 1-5 both feet with no iatrogenic bleeding with considerations for more aggressive treatment as needed.  Reappoint 3 months routine care

## 2018-04-12 IMAGING — RF DG ESOPHAGUS
4 of 5 series · 15 of 17 positions shown · non-contrast
Comparison: 05/02/2016

CLINICAL DATA: Subcutaneous emphysema after procedure.

EXAM:
ESOPHOGRAM/BARIUM SWALLOW
TECHNIQUE: Single contrast examination was performed using water soluble
contrast.
FLUOROSCOPY TIME:  Fluoroscopy Time:  0.7 minutes
Radiation Exposure Index (if provided by the fluoroscopic device):
5.9 mGy
Number of Acquired Spot Images: 1

[Series 1: cp_standard · 0.51mm/px · 4 of 52 frames shown (1 of 4)]
[frame 8/52]
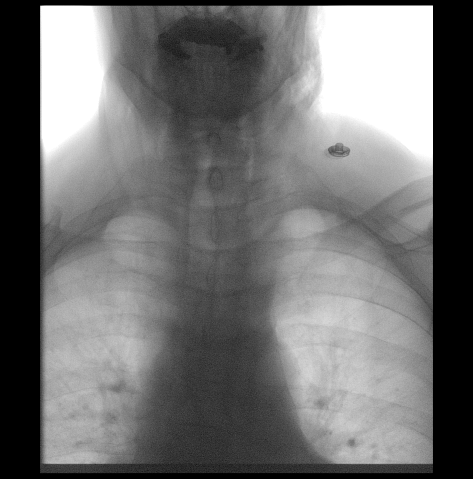
[frame 27/52]
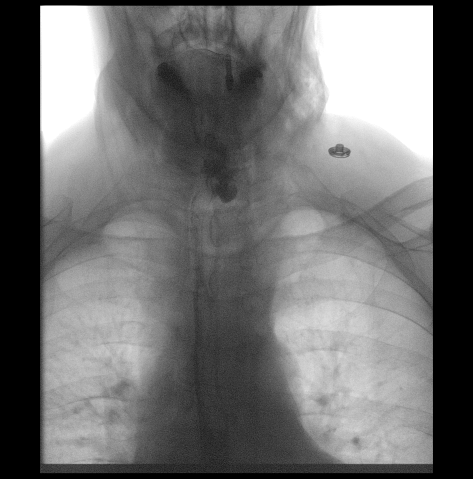
[frame 36/52]
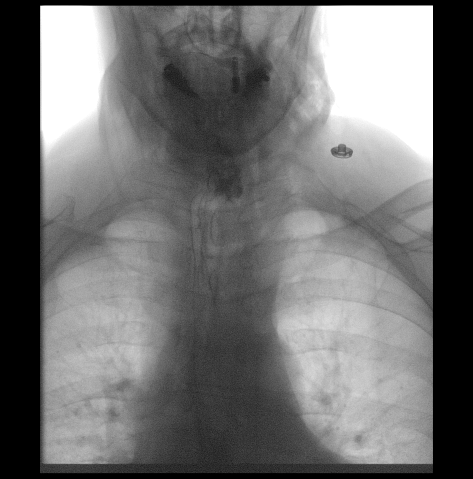
[frame 45/52]
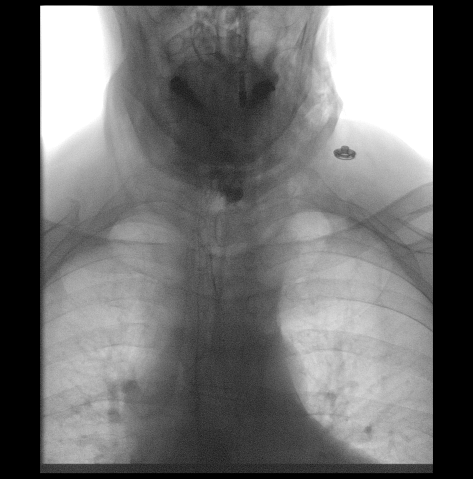

[Series 2: cp_standard · 0.51mm/px · 3 of 79 frames shown (2 of 4)]
[frame 22/79]
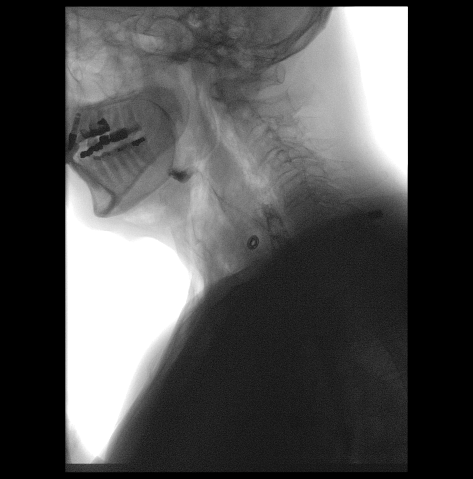
[frame 40/79]
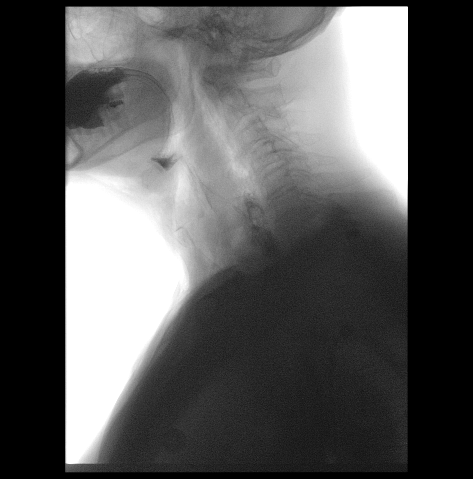
[frame 68/79]
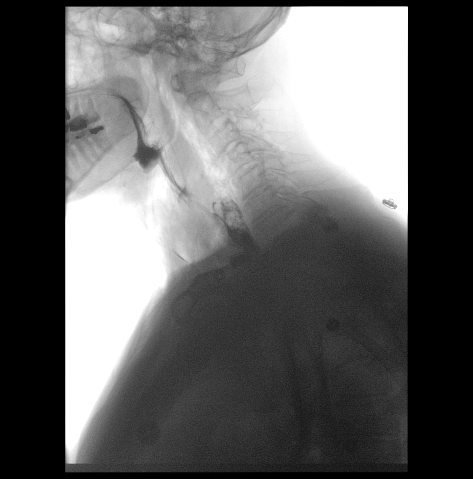

[Series 3: cp_standard · 0.52mm/px · 4 of 29 frames shown (3 of 4)]
[frame 5/29]
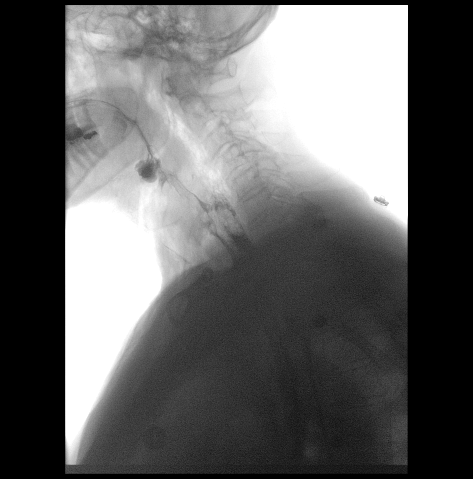
[frame 15/29]
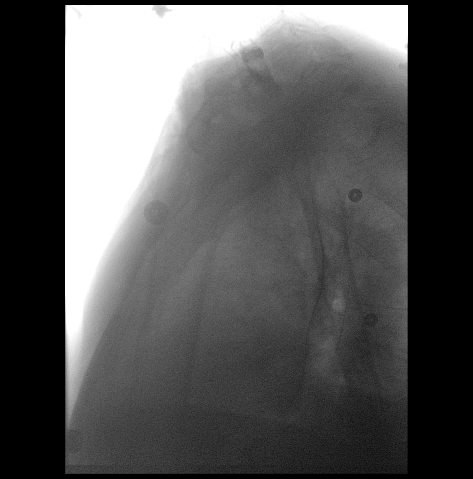
[frame 18/29]
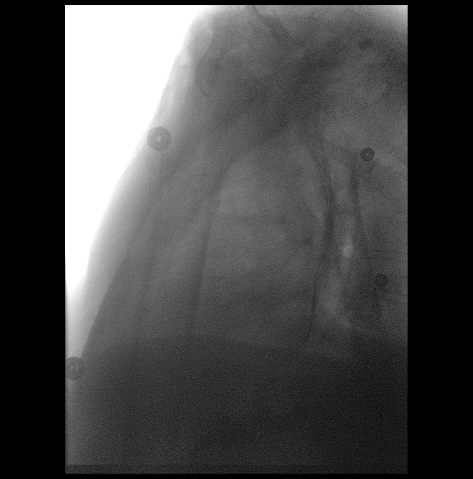
[frame 25/29]
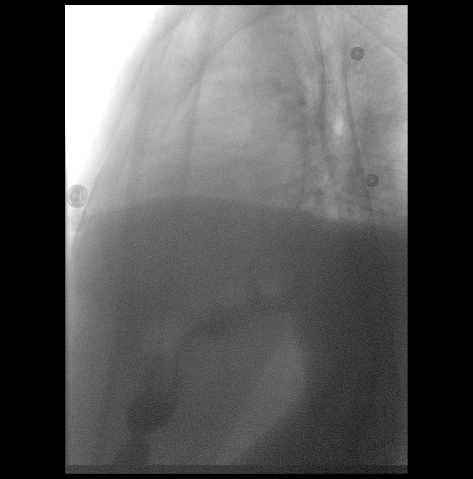

[Series 5: cp_standard · 0.52mm/px · 4 of 55 frames shown (4 of 4)]
[frame 9/55]
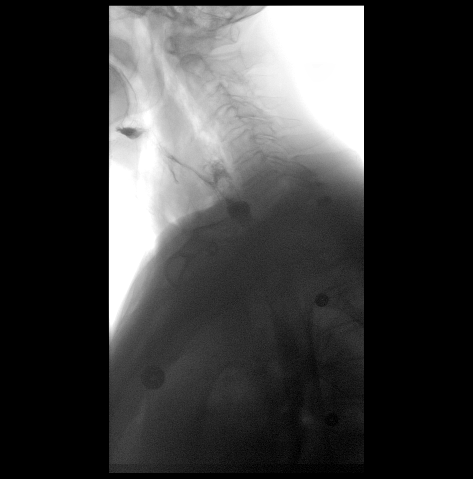
[frame 27/55]
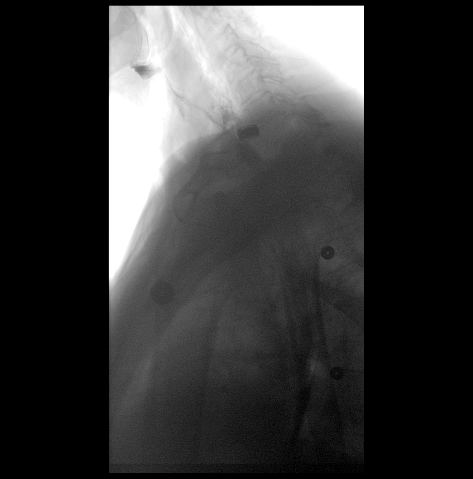
[frame 28/55]
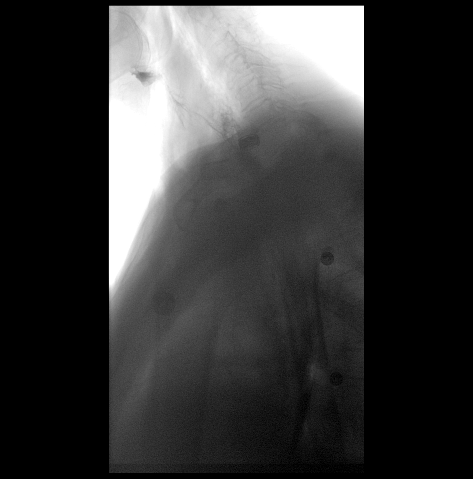
[frame 47/55]
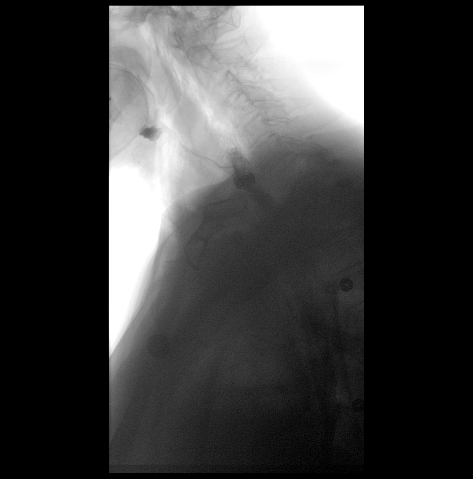

[15 of 17 positions shown; findings below may reference images not displayed]

FINDINGS: Interval endoscopic division of diverticulum with is visible left of
midline. There is also ill-defined accumulation of contrast in the
retropharyngeal space, in an area larger than the diverticulum as
seen on preoperative exam, consistent with leak. The leak is likely
along the superior margin of the diverticulum, due to overlapping
structures a measurable wall defect is not confidently identified.
Soft tissue emphysema is extensive in the retropharynx and extends
to the skullbase. Pharynx is displaced anteriorly and aspiration
noted persistently. Aspiration was symptomatic and resulted in
throat clearing.
IMPRESSION: 1. Positive for operative site leak, as above.
2. Aspiration. There is pharyngeal distortion and stasis due to
retropharyngeal gas and swelling.

## 2018-05-17 ENCOUNTER — Telehealth: Payer: Self-pay

## 2018-05-17 NOTE — Telephone Encounter (Signed)
Author phoned pt. to schedule subsequent awv. No answer; author left detailed VM asking for return call.   Copied from [place] [id number]. Topic: Medicare AWV >> May 17, 2018  3:00 PM Judyann Munson wrote: Reason for CRM:  patient is calling to request a AWV. Please advise

## 2018-05-24 NOTE — Telephone Encounter (Signed)
Author phoned pt. [phone] to schedule AWV, but pt. Now has tricare as primary insurance, so not eligible for awv.

## 2018-06-06 ENCOUNTER — Ambulatory Visit (INDEPENDENT_AMBULATORY_CARE_PROVIDER_SITE_OTHER): Payer: Medicare Other | Admitting: Family Medicine

## 2018-06-06 ENCOUNTER — Encounter: Payer: Self-pay | Admitting: Family Medicine

## 2018-06-06 VITALS — BP 130/74 | HR 78 | Temp 97.9°F | Wt 203.0 lb

## 2018-06-06 DIAGNOSIS — R5383 Other fatigue: Secondary | ICD-10-CM

## 2018-06-06 DIAGNOSIS — J302 Other seasonal allergic rhinitis: Secondary | ICD-10-CM

## 2018-06-06 DIAGNOSIS — Z8739 Personal history of other diseases of the musculoskeletal system and connective tissue: Secondary | ICD-10-CM

## 2018-06-06 DIAGNOSIS — L989 Disorder of the skin and subcutaneous tissue, unspecified: Secondary | ICD-10-CM

## 2018-06-06 DIAGNOSIS — G8929 Other chronic pain: Secondary | ICD-10-CM

## 2018-06-06 NOTE — Progress Notes (Signed)
Subjective:    Patient ID: [name], male    DOB: [birth year], 73 y.o.   MRN: [id number]  No chief complaint on file.   HPI Patient was seen today for follow-up on ongoing concern and TOC, previously seen by Dr. Burnice Logan.  Current concerns: -Patient endorses fatigue times months -Patient was seen at urgent care and requested labs -CMP, CBC, testosterone, TSH, free T4 were drawn -Hemoglobin was 12.4, testosterone low at 124.  Patient states labs were done in the afternoon. -Patient was advised to follow-up -Patient states he is drinking 5-6 6 ounce glasses of water per day -Patient "eating regular stuff" -Patient states he wakes up in the morning tired and often takes naps during the day.  Chronic knee and back pain: -Patient s/p 3 operations per knee -Currently taking MS Contin and oxycodone.  Rx was filled by the [facility] -Patient was in the [employer] for 27 years. -Also taking ibuprofen  History of gout: -Taking allopurinol daily -Last gout attack years ago  Seasonal allergies: -taking flonase and Zyrtec prn  Skin concerns: Patient has several areas on his skin that have been slow to heal -Area on left lateral lower leg and 3 areas on midline back.  Past Medical History:  Diagnosis Date  . Chronic back pain    buldging disc  . Depression   . GERD (gastroesophageal reflux disease)   . GOUT    takes Allopurinol daily and Colchicine as needed  . History of bronchitis    many yrs ago  . History of colonoscopy    benign  . History of shingles   . Joint pain   . Joint swelling   . Muscle spasm    takes Robaxin as needed  . Nocturia   . OSTEOARTHRITIS   . Peripheral edema    takes Lasix daily as needed  . Pneumonia 1969  . Seasonal allergies    takes Zyrtec and uses Flonase is needed  . TESTICULAR HYPOFUNCTION   . Zenker diverticula     Allergies  Allergen Reactions  . No Known Allergies     ROS General: Denies fever, chills, night sweats, changes in  weight, changes in appetite  +fatigue HEENT: Denies headaches, ear pain, changes in vision, rhinorrhea, sore throat CV: Denies CP, palpitations, SOB, orthopnea Pulm: Denies SOB, cough, wheezing GI: Denies abdominal pain, nausea, vomiting, diarrhea, constipation GU: Denies dysuria, hematuria, frequency, vaginal discharge Msk: Denies muscle cramps, joint pains Neuro: Denies weakness, numbness, tingling Skin: Denies rashes, bruising Psych: Denies depression, anxiety, hallucinations    Objective:    Blood pressure 130/74, pulse 78, temperature 97.9 F (36.6 C), temperature source Oral, weight 203 lb (92.1 kg), SpO2 97 %.  Gen. Pleasant, well-nourished, in no distress, normal affect   HEENT: [place]/AT, face symmetric, no scleral icterus, PERRLA, nares patent without drainage, pharynx without erythema or exudate.  TMs normal b/l. Lungs: no accessory muscle use, CTAB, no wheezes or rales Cardiovascular: RRR, no m/r/g, no peripheral edema Neuro:  A&Ox3, CN II-XII intact, normal gait Skin:  Warm, rash.   Healing lesion on L lateral calf 2 cm in diameter.  2 small healing lesions midline thoracic back with central punctum and lower back to R of midline.  No erythema, induration, or drainage noted.  Wt Readings from Last 3 Encounters:  11/08/16 208 lb (94.3 kg)  09/27/16 209 lb 8 oz (95 kg)  08/16/16 215 lb (97.5 kg)    Lab Results  Component Value Date   WBC 4.7  11/08/2016   HGB 12.8 (L) 11/08/2016   HCT 37.7 (L) 11/08/2016   PLT 197.0 11/08/2016   GLUCOSE 123 (H) 11/08/2016   CHOL 143 11/08/2016   TRIG 246.0 (H) 11/08/2016   HDL 44.90 11/08/2016   LDLDIRECT 64.0 11/08/2016   ALT 15 11/08/2016   AST 27 11/08/2016   NA 139 11/08/2016   K 3.8 11/08/2016   CL 101 11/08/2016   CREATININE 0.75 11/08/2016   BUN 10 11/08/2016   CO2 33 (H) 11/08/2016   TSH 1.64 11/08/2016   PSA 0.32 12/28/2009   HGBA1C 5.4 06/25/2014    Assessment/Plan:  Fatigue, unspecified type  -given  handout -advised to have testosterone rechecked first thing in the am.  If low, consider referral to urology - Plan: Testosterone, Vitamin D, 25-hydroxy, CBC (no diff), Hemoglobin A1c  Other chronic pain -[facility] -MS contin and Oxycodone  History of gout -continue allopurinol daily  Skin lesion -Advised lesions are healing and to avoid picking/scratching them -Okay to use Neosporin -Otherwise keep areas clean and dry -We will continue to monitor  Seasonal allergies -Continue Flonase and cetirizine as needed  F/u prn  Grier Mitts, MD

## 2018-06-06 NOTE — Patient Instructions (Signed)

## 2018-07-11 ENCOUNTER — Ambulatory Visit: Payer: Medicare Other | Admitting: Podiatry

## 2018-07-11 ENCOUNTER — Encounter: Payer: Self-pay | Admitting: Podiatry

## 2018-07-11 ENCOUNTER — Other Ambulatory Visit: Payer: Self-pay

## 2018-07-11 ENCOUNTER — Ambulatory Visit (INDEPENDENT_AMBULATORY_CARE_PROVIDER_SITE_OTHER): Payer: Medicare Other | Admitting: Podiatry

## 2018-07-11 VITALS — Temp 98.2°F

## 2018-07-11 DIAGNOSIS — M79674 Pain in right toe(s): Secondary | ICD-10-CM

## 2018-07-11 DIAGNOSIS — L600 Ingrowing nail: Secondary | ICD-10-CM

## 2018-07-11 DIAGNOSIS — M79675 Pain in left toe(s): Secondary | ICD-10-CM

## 2018-07-11 DIAGNOSIS — B351 Tinea unguium: Secondary | ICD-10-CM | POA: Diagnosis not present

## 2018-07-11 MED ORDER — NEOMYCIN-POLYMYXIN-HC 3.5-10000-1 OT SOLN: OTIC | 1 refills | Status: DC

## 2018-07-11 NOTE — Patient Instructions (Signed)

## 2018-07-15 NOTE — Progress Notes (Signed)
Subjective:   Patient ID: [name], male   DOB: 73 y.o.   MRN: [id number]   HPI Patient presents stating he has an ingrown toenail of the right big toe that is impossible for him to get out and he like to have it removed and he has generalized nail thickness 1-5 both feet that can be bothersome at times and he cannot cut   ROS      Objective:  Physical Exam  Neurovascular status unchanged with thick yellow brittle nailbeds 1-5 both feet and incurvation lateral border right hallux.  Gave when he cannot cut out himself and he wants it removed understanding risk.  I did note good digital perfusion well oriented x3     Assessment:  Ingrown toenail deformity right hallux lateral border with mycotic nail infection 1-5 both feet     Plan:  H&P condition reviewed and did discuss ingrown toenail correction explaining the risk of procedure and I allowed patient to read consent form and patient signed after review today I went ahead and I infiltrated the right hallux 60 mg like Marcaine mixture removed the border exposed matrix and applied phenol 3 applications 30 seconds followed by alcohol lavage and sterile dressing.  Gave instructions on soaks and reappoint and went ahead and debrided nailbeds 1-5 both feet with no iatrogenic bleeding

## 2018-10-10 ENCOUNTER — Ambulatory Visit: Payer: Medicare Other | Admitting: Podiatry

## 2018-10-11 ENCOUNTER — Emergency Department (HOSPITAL_COMMUNITY): Payer: Medicare Other

## 2018-10-11 ENCOUNTER — Ambulatory Visit (INDEPENDENT_AMBULATORY_CARE_PROVIDER_SITE_OTHER): Payer: Medicare Other | Admitting: Podiatry

## 2018-10-11 ENCOUNTER — Other Ambulatory Visit: Payer: Self-pay

## 2018-10-11 ENCOUNTER — Encounter (HOSPITAL_COMMUNITY): Payer: Self-pay | Admitting: Emergency Medicine

## 2018-10-11 ENCOUNTER — Encounter (HOSPITAL_COMMUNITY): Payer: Medicare Other

## 2018-10-11 ENCOUNTER — Inpatient Hospital Stay (HOSPITAL_COMMUNITY)
Admission: EM | Admit: 2018-10-11 | Discharge: 2018-10-14 | DRG: 308 | Disposition: A | Discharge status: Home / Self Care | Payer: Medicare Other | Attending: Internal Medicine | Admitting: Internal Medicine

## 2018-10-11 ENCOUNTER — Encounter: Payer: Self-pay | Admitting: Podiatry

## 2018-10-11 VITALS — Temp 97.9°F

## 2018-10-11 DIAGNOSIS — I4891 Unspecified atrial fibrillation: Secondary | ICD-10-CM | POA: Diagnosis not present

## 2018-10-11 DIAGNOSIS — L97819 Non-pressure chronic ulcer of other part of right lower leg with unspecified severity: Secondary | ICD-10-CM | POA: Diagnosis present

## 2018-10-11 DIAGNOSIS — Z1159 Encounter for screening for other viral diseases: Secondary | ICD-10-CM

## 2018-10-11 DIAGNOSIS — M79674 Pain in right toe(s): Secondary | ICD-10-CM

## 2018-10-11 DIAGNOSIS — M7989 Other specified soft tissue disorders: Secondary | ICD-10-CM | POA: Diagnosis not present

## 2018-10-11 DIAGNOSIS — Z7951 Long term (current) use of inhaled steroids: Secondary | ICD-10-CM

## 2018-10-11 DIAGNOSIS — D539 Nutritional anemia, unspecified: Secondary | ICD-10-CM | POA: Diagnosis present

## 2018-10-11 DIAGNOSIS — M79675 Pain in left toe(s): Secondary | ICD-10-CM | POA: Diagnosis not present

## 2018-10-11 DIAGNOSIS — Z7982 Long term (current) use of aspirin: Secondary | ICD-10-CM

## 2018-10-11 DIAGNOSIS — M109 Gout, unspecified: Secondary | ICD-10-CM | POA: Diagnosis present

## 2018-10-11 DIAGNOSIS — R600 Localized edema: Secondary | ICD-10-CM

## 2018-10-11 DIAGNOSIS — F1010 Alcohol abuse, uncomplicated: Secondary | ICD-10-CM | POA: Diagnosis present

## 2018-10-11 DIAGNOSIS — I878 Other specified disorders of veins: Secondary | ICD-10-CM | POA: Diagnosis present

## 2018-10-11 DIAGNOSIS — M549 Dorsalgia, unspecified: Secondary | ICD-10-CM | POA: Diagnosis present

## 2018-10-11 DIAGNOSIS — B351 Tinea unguium: Secondary | ICD-10-CM

## 2018-10-11 DIAGNOSIS — I872 Venous insufficiency (chronic) (peripheral): Secondary | ICD-10-CM | POA: Diagnosis not present

## 2018-10-11 DIAGNOSIS — M79661 Pain in right lower leg: Secondary | ICD-10-CM

## 2018-10-11 DIAGNOSIS — Z79891 Long term (current) use of opiate analgesic: Secondary | ICD-10-CM

## 2018-10-11 DIAGNOSIS — I5031 Acute diastolic (congestive) heart failure: Secondary | ICD-10-CM | POA: Diagnosis present

## 2018-10-11 DIAGNOSIS — Z7952 Long term (current) use of systemic steroids: Secondary | ICD-10-CM

## 2018-10-11 DIAGNOSIS — I4892 Unspecified atrial flutter: Secondary | ICD-10-CM | POA: Diagnosis present

## 2018-10-11 DIAGNOSIS — K219 Gastro-esophageal reflux disease without esophagitis: Secondary | ICD-10-CM | POA: Diagnosis present

## 2018-10-11 DIAGNOSIS — G8929 Other chronic pain: Secondary | ICD-10-CM | POA: Diagnosis present

## 2018-10-11 DIAGNOSIS — Z79899 Other long term (current) drug therapy: Secondary | ICD-10-CM

## 2018-10-11 LAB — CBC WITH DIFFERENTIAL/PLATELET
Abs Immature Granulocytes: 0.02 10*3/uL (ref 0.00–0.07)
Basophils Absolute: 0 10*3/uL (ref 0.0–0.1)
Basophils Relative: 1 %
Eosinophils Absolute: 0 10*3/uL (ref 0.0–0.5)
Eosinophils Relative: 0 %
HCT: 34.3 % — ABNORMAL LOW (ref 39.0–52.0)
Hemoglobin: 11.9 g/dL — ABNORMAL LOW (ref 13.0–17.0)
Immature Granulocytes: 1 %
Lymphocytes Relative: 27 %
Lymphs Abs: 1.1 10*3/uL (ref 0.7–4.0)
MCH: 36.4 pg — ABNORMAL HIGH (ref 26.0–34.0)
MCHC: 34.7 g/dL (ref 30.0–36.0)
MCV: 104.9 fL — ABNORMAL HIGH (ref 80.0–100.0)
Monocytes Absolute: 0.4 10*3/uL (ref 0.1–1.0)
Monocytes Relative: 10 %
Neutro Abs: 2.5 10*3/uL (ref 1.7–7.7)
Neutrophils Relative %: 61 %
Platelets: 161 10*3/uL (ref 150–400)
RBC: 3.27 MIL/uL — ABNORMAL LOW (ref 4.22–5.81)
RDW: 12 % (ref 11.5–15.5)
WBC: 4.1 10*3/uL (ref 4.0–10.5)
nRBC: 0 % (ref 0.0–0.2)

## 2018-10-11 LAB — URINALYSIS, ROUTINE W REFLEX MICROSCOPIC
Bilirubin Urine: NEGATIVE
Glucose, UA: NEGATIVE mg/dL
Hgb urine dipstick: NEGATIVE
Ketones, ur: NEGATIVE mg/dL
Leukocytes,Ua: NEGATIVE
Nitrite: NEGATIVE
Protein, ur: NEGATIVE mg/dL
Specific Gravity, Urine: 1.003 — ABNORMAL LOW (ref 1.005–1.030)
pH: 8 (ref 5.0–8.0)

## 2018-10-11 LAB — MAGNESIUM: Magnesium: 1.6 mg/dL — ABNORMAL LOW (ref 1.7–2.4)

## 2018-10-11 LAB — HEPATIC FUNCTION PANEL
ALT: 35 U/L (ref 0–44)
AST: 68 U/L — ABNORMAL HIGH (ref 15–41)
Albumin: 3.3 g/dL — ABNORMAL LOW (ref 3.5–5.0)
Alkaline Phosphatase: 113 U/L (ref 38–126)
Bilirubin, Direct: 0.3 mg/dL — ABNORMAL HIGH (ref 0.0–0.2)
Indirect Bilirubin: 0.4 mg/dL (ref 0.3–0.9)
Total Bilirubin: 0.7 mg/dL (ref 0.3–1.2)
Total Protein: 7 g/dL (ref 6.5–8.1)

## 2018-10-11 LAB — BASIC METABOLIC PANEL
Anion gap: 10 (ref 5–15)
BUN: 9 mg/dL (ref 8–23)
CO2: 30 mmol/L (ref 22–32)
Calcium: 9.1 mg/dL (ref 8.9–10.3)
Chloride: 100 mmol/L (ref 98–111)
Creatinine, Ser: 0.8 mg/dL (ref 0.61–1.24)
GFR calc Af Amer: >60 mL/min (ref >60)
GFR calc non Af Amer: >60 mL/min (ref >60)
Glucose, Bld: 122 mg/dL — ABNORMAL HIGH (ref 70–99)
Potassium: 4 mmol/L (ref 3.5–5.1)
Sodium: 140 mmol/L (ref 135–145)

## 2018-10-11 LAB — TROPONIN I (HIGH SENSITIVITY): Troponin I (High Sensitivity): 11 ng/L (ref <18)

## 2018-10-11 LAB — BRAIN NATRIURETIC PEPTIDE: B Natriuretic Peptide: 234.9 pg/mL — ABNORMAL HIGH (ref 0.0–100.0)

## 2018-10-11 LAB — D-DIMER, QUANTITATIVE: D-Dimer, Quant: 0.57 ug/mL-FEU — ABNORMAL HIGH (ref 0.00–0.50)

## 2018-10-11 LAB — TSH: TSH: 1.464 u[IU]/mL (ref 0.350–4.500)

## 2018-10-11 LAB — SARS CORONAVIRUS 2 BY RT PCR (HOSPITAL ORDER, PERFORMED IN ~~LOC~~ HOSPITAL LAB): SARS Coronavirus 2: NEGATIVE

## 2018-10-11 MED ORDER — MAGNESIUM SULFATE IN D5W 1-5 GM/100ML-% IV SOLN
1.0000 g | Freq: Once | INTRAVENOUS | Status: AC
  Administered 2018-10-12: 01:00:00 1 g via INTRAVENOUS
  Filled 2018-10-11 (×2): qty 100

## 2018-10-11 MED ORDER — DILTIAZEM HCL-DEXTROSE 100-5 MG/100ML-% IV SOLN (PREMIX)
5.0000 mg/h | INTRAVENOUS | Status: DC
  Administered 2018-10-11: 5 mg/h via INTRAVENOUS
  Filled 2018-10-11: qty 100

## 2018-10-11 MED ORDER — HEPARIN BOLUS VIA INFUSION
5000.0000 [IU] | Freq: Once | INTRAVENOUS | Status: AC
  Administered 2018-10-12: 5000 [IU] via INTRAVENOUS
  Filled 2018-10-11: qty 5000

## 2018-10-11 MED ORDER — DILTIAZEM LOAD VIA INFUSION
10.0000 mg | Freq: Once | INTRAVENOUS | Status: AC
  Administered 2018-10-11: 10 mg via INTRAVENOUS
  Filled 2018-10-11: qty 10

## 2018-10-11 MED ORDER — HEPARIN (PORCINE) 25000 UT/250ML-% IV SOLN
2200.0000 [IU]/h | INTRAVENOUS | Status: DC
  Administered 2018-10-12: 01:00:00 1600 [IU]/h via INTRAVENOUS
  Administered 2018-10-12: 2050 [IU]/h via INTRAVENOUS
  Administered 2018-10-13 – 2018-10-14 (×2): 2200 [IU]/h via INTRAVENOUS
  Filled 2018-10-11 (×5): qty 250

## 2018-10-11 NOTE — Progress Notes (Signed)
Subjective: [name] presents to clinic today with cc of bilateral lower extremity edema and pain  x 1 month. He relates pain moreso in the right lower extremity. Denies any episodes of trauma. He states he has not been on any diuretics since 2017 with [name].  [name] states he is to see new PCP, [name] in August. He does not recall seeing [name] in March. Patient is seeking professional help regarding symptoms.  Past Medical History:  Diagnosis Date  . Chronic back pain    buldging disc  . Depression   . GERD (gastroesophageal reflux disease)   . GOUT    takes Allopurinol daily and Colchicine as needed  . History of bronchitis    many yrs ago  . History of colonoscopy    benign  . History of shingles   . Joint pain   . Joint swelling   . Muscle spasm    takes Robaxin as needed  . Nocturia   . OSTEOARTHRITIS   . Peripheral edema    takes Lasix daily as needed  . Pneumonia 1969  . Seasonal allergies    takes Zyrtec and uses Flonase is needed  . TESTICULAR HYPOFUNCTION   . Zenker diverticula      Patient Active Problem List   Diagnosis Date Noted  . Oropharyngeal dysphagia 08/14/2017  . Sore throat 07/21/2016  . Vasomotor rhinitis 06/23/2016  . Zenker's diverticulum 06/14/2016  . Chronic venous insufficiency 06/28/2015  . Vitamin B12 deficiency 02/07/2013  . History of colonic polyps 09/15/2010  . Weakness generalized 06/09/2010  . TESTICULAR HYPOFUNCTION 01/04/2010  . PULMONARY NODULE, LEFT LOWER LOBE 01/04/2010  . Allergic rhinitis 06/17/2009  . WEIGHT LOSS 03/17/2009  . GOUT 05/04/2008  . Osteoarthritis 05/04/2008  . LOW BACK PAIN 05/04/2008     Past Surgical History:  Procedure Laterality Date  . ANKLE SURGERY     4 times  . COLONOSCOPY    . ELBOW SURGERY Right    torn tendon  . INGUINAL HERNIA REPAIR     right  . KNEE SURGERY     3 on each knee  . SHOULDER ARTHROSCOPY Right 06/15/2015   Procedure: RIGHT SHOULDER  ARTHROSCOPY, ACROMIOPLASTY, DEBRIDEMENT;  Surgeon: David Nakayama, MD;  Location: [facility];  Service: Orthopedics;  Laterality: Right;  . shoulder arthrosopy Left   . TONSILLECTOMY AND ADENOIDECTOMY    . TUMOR EXCISION     [name] cyst removed from chest  . ZENKER'S DIVERTICULECTOMY ENDOSCOPIC N/A 06/14/2016   Procedure: [name] DIVERTICULECTOMY ENDOSCOPIC;  Surgeon: David Gala, MD;  Location: [facility];  Service: ENT;  Laterality: N/A;    Medications reviewed.  Allergies  Allergen Reactions  . No Known Allergies      Social History   Occupational History  . Not on file  Tobacco Use  . Smoking status: Never Smoker  . Smokeless tobacco: Never Used  Substance and Sexual Activity  . Alcohol use: Yes    Alcohol/week: 22.0 standard drinks    Types: 10 Standard drinks or equivalent, 12 Cans of beer per week    Comment: daily  . Drug use: No  . Sexual activity: Not on file     No family history on file.   Immunization History  Administered Date(s) Administered  . Influenza Split 12/31/2012  . Influenza Whole 01/01/2010  . Influenza, High Dose Seasonal PF 01/05/2016  . Influenza-Unspecified 01/14/2014, 02/03/2015  . Pneumococcal Conjugate-13 04/23/2014  . Pneumococcal Polysaccharide-23 06/28/2010  . Tdap  02/05/2013  . Zoster 03/17/2009     Review of systems: Positive Findings in bold print.  Constitutional:  chills, fatigue, fever, sweats, weight change Communication: Optometrist, sign Ecologist, hand writing, iPad/Android device Head: headaches, head injury Eyes: changes in vision, eye pain, glaucoma, cataracts, macular degeneration, diplopia, glare,  light sensitivity, eyeglasses or contacts, blindness Ears nose mouth throat: hearing impaired, hearing aids,  ringing in ears, deaf, sign language,  vertigo,   nosebleeds,  rhinitis,  cold sores, snoring, swollen glands Cardiovascular: HTN, edema, arrhythmia, pacemaker in place, defibrillator in place, chest  pain/tightness, chronic anticoagulation, blood clot, heart failure, MI Peripheral Vascular: venous insufficiency, leg cramps, varicose veins, blood clots, lymphedema, varicosities Respiratory:  difficulty breathing, denies congestion, SOB, wheezing, cough, emphysema Gastrointestinal: change in appetite or weight, abdominal pain, constipation, diarrhea, nausea, vomiting, vomiting blood, change in bowel habits, abdominal pain, jaundice, rectal bleeding, hemorrhoids, GERD Genitourinary:  nocturia,  pain on urination, polyuria,  blood in urine, Foley catheter, urinary urgency, ESRD on hemodialysis Musculoskeletal: amputation, cramping, stiff joints, painful joints, decreased joint motion, fractures, OA, gout, hemiplegia, paraplegia, uses cane, wheelchair bound, uses walker, uses rollator [name]: +changes in toenails, color change, dryness, itching, mole changes,  rash, wound(s) Neurological: headaches, numbness in feet, paresthesias in feet, burning in feet, fainting,  seizures, change in speech. denies headaches, memory problems/poor historian, cerebral palsy, weakness, paralysis, CVA, TIA Endocrine: diabetes, hypothyroidism, hyperthyroidism,  goiter, dry mouth, flushing, heat intolerance,  cold intolerance,  excessive thirst, denies polyuria,  nocturia Hematological:  easy bleeding, excessive bleeding, easy bruising, enlarged lymph nodes, on long term blood thinner, history of past transusions Allergy/immunological:  hives, eczema, frequent infections, multiple drug allergies, seasonal allergies, transplant recipient, multiple food allergies Psychiatric:  anxiety, depression, mood disorder, suicidal ideations, hallucinations, insomnia  Objective: Vitals:   10/11/18 1440  Temp: 97.9 F (36.6 C)    Vascular Examination: Capillary refill time <3 seconds x 10 digits.  Dorsalis pedis faintly palpable, I believe secondary to +2-3 pittiing edema b/l.  Posterior tibial pulses   Digit hair present  b/l.  [name] temperature WNL b/l.  +2-3 edema BLE from knee distally to dorsum of both feet. Weeping  Of clear fluid noted right lower leg.  +Pain with calf compression of RLE.  Dermatological Examination: Pedal [name] thin and atrophic b/l.  Toenails 1-5 b/l are painful, elongated, discolored, dystrophic with subungual debris. Pain with dorsal palpation of nailplates. No erythema, no edema, no drainage noted.   Musculoskeletal: Muscle strength 5/5 to all LE muscle groups.  Neurological: Sensation intact with 10 gram monofilament.  UTA vibratory sensation due to edema.   Assessment: Bilateral lower extremity edema Rule out DVT lower extremities Painful onychomycosis toenails 1-5 b/l  Plan: 1. Discussed edema and need for assessment to rule out DVT. Will send him to ED to have DVT ruled out.  2. He does not remember seeing [name] in March, but insists he will see [name] in August as new PCP. Has seen [name] previously for management of LE edema. 3. Toenails 1-5 b/l debrided in length and girth without iatrogenic laceration. 4. Patient to continue soft, supportive shoe gear daily. 5. Patient to report any pedal injuries to medical professional immediately. 6. Follow up 3 months.  7. Patient/POA to call should there be a concern in the interim.

## 2018-10-11 NOTE — ED Provider Notes (Signed)
[facility] EMERGENCY DEPARTMENT Provider Note   CSN: [id number] Arrival date & time: 10/11/18  1525    History   Chief Complaint Chief Complaint  Patient presents with  . Leg Swelling    HPI [name] is a 73 y.o. male sent in by podiatrist today for DVT rule out.  Patient reports that he has had bilateral leg swelling for many months now and over the past few weeks he has noticed a pain to his right lower leg moderate throbbing constant without clear aggravating or alleviating factors.  Denies injury or trauma.  Small weeping area to anterior right lower leg past few days without injury.  Patient reports that his bilateral leg swelling has been worsening for many months now he reports that it used to go away at night however now it remains constant.  He also endorses intermittent shortness of breath as well as orthopnea.  On evaluation he denies any current shortness of breath, he denies history of chest pain, fever/chills, cough, hemoptysis, nausea/vomiting, abdominal pain or any additional concerns.  He denies diuretic use.  On arrival patient noted to be in atrial fibrillation with RVR up to 160 bpm, he was surprised by this finding he denies palpitations, lightheadedness, dizziness and reports that he was unaware of tachycardia prior to arrival.  He denies any blood thinner use or diagnosis of atrial fibrillation/flutter.    HPI  Past Medical History:  Diagnosis Date  . Chronic back pain    buldging disc  . Depression   . GERD (gastroesophageal reflux disease)   . GOUT    takes Allopurinol daily and Colchicine as needed  . History of bronchitis    many yrs ago  . History of colonoscopy    benign  . History of shingles   . Joint pain   . Joint swelling   . Muscle spasm    takes Robaxin as needed  . Nocturia   . OSTEOARTHRITIS   . Peripheral edema    takes Lasix daily as needed  . Pneumonia 1969  . Seasonal allergies    takes Zyrtec and uses  Flonase is needed  . TESTICULAR HYPOFUNCTION   . Zenker diverticula     Patient Active Problem List   Diagnosis Date Noted  . Oropharyngeal dysphagia 08/14/2017  . Sore throat 07/21/2016  . Vasomotor rhinitis 06/23/2016  . Zenker's diverticulum 06/14/2016  . Chronic venous insufficiency 06/28/2015  . Vitamin B12 deficiency 02/07/2013  . History of colonic polyps 09/15/2010  . Weakness generalized 06/09/2010  . TESTICULAR HYPOFUNCTION 01/04/2010  . PULMONARY NODULE, LEFT LOWER LOBE 01/04/2010  . Allergic rhinitis 06/17/2009  . WEIGHT LOSS 03/17/2009  . GOUT 05/04/2008  . Osteoarthritis 05/04/2008  . LOW BACK PAIN 05/04/2008    Past Surgical History:  Procedure Laterality Date  . ANKLE SURGERY     4 times  . COLONOSCOPY    . ELBOW SURGERY Right    torn tendon  . INGUINAL HERNIA REPAIR     right  . KNEE SURGERY     3 on each knee  . SHOULDER ARTHROSCOPY Right 06/15/2015   Procedure: RIGHT SHOULDER ARTHROSCOPY, ACROMIOPLASTY, DEBRIDEMENT;  Surgeon: Melrose Nakayama, MD;  Location: [facility];  Service: Orthopedics;  Laterality: Right;  . shoulder arthrosopy Left   . TONSILLECTOMY AND ADENOIDECTOMY    . TUMOR EXCISION     sebaceous cyst removed from chest  . ZENKER'S DIVERTICULECTOMY ENDOSCOPIC N/A 06/14/2016   Procedure: [facility] DIVERTICULECTOMY ENDOSCOPIC;  Surgeon:  Izora Gala, MD;  Location: [facility];  Service: ENT;  Laterality: N/A;        Home Medications    Prior to Admission medications   Medication Sig Start Date End Date Taking? Authorizing Provider  allopurinol (ZYLOPRIM) 100 MG tablet Take 5 tablets (500 mg total) by mouth daily. Patient taking differently: Take 300 mg by mouth daily.  01/19/14   Marletta Lor, MD  ARTIFICIAL TEARS 0.2-0.2-1 % SOLN  07/16/18   [provider]  aspirin 325 MG tablet Take 325 mg by mouth daily.      [provider]  CALCIUM 600 1500 (600 Ca) MG TABS tablet Take 600 mg of elemental calcium by mouth 2 (two)  times daily.  02/16/16   [provider]  capsaicin (ZOSTRIX) 0.025 % cream  07/16/18   [provider]  Cetirizine HCl 10 MG TBDP Take 10 mg by mouth daily as needed (for allergies).     [provider]  cloNIDine (CATAPRES) 0.1 MG tablet  07/23/18   [provider]  clotrimazole (LOTRIMIN) 1 % cream  07/16/18   [provider]  colchicine 0.6 MG tablet Take 0.6 mg by mouth 3 (three) times daily as needed (for gout flares).     [provider]  [facility] CALCIUM 600 MG tablet  07/26/17   [provider]  cyanocobalamin 2000 MCG tablet Take 2,000 mcg by mouth daily.    [provider]  diphenoxylate-atropine (LOMOTIL) 2.5-0.025 MG tablet Take 2 tablets by mouth 4 (four) times daily as needed for diarrhea or loose stools. 08/02/17   Wardell Honour, MD  famotidine (PEPCID) 20 MG tablet  09/25/18   [provider]  fluticasone (FLONASE) 50 MCG/ACT nasal spray Place 2 sprays into both nostrils daily as needed for allergies.     [provider]  furosemide (LASIX) 40 MG tablet Take 1 tablet (40 mg total) by mouth daily. Patient taking differently: Take 40 mg by mouth daily as needed (for swelling in joints.).  06/28/15   Marletta Lor, MD  Glycerin-Hypromellose-PEG 400 0.2-0.2-1 % SOLN  02/07/18   [provider]  ibuprofen (ADVIL,MOTRIN) 600 MG tablet Take 600 mg by mouth 3 (three) times daily as needed (for pain.).  02/18/16   [provider]  indomethacin (INDOCIN) 50 MG capsule Take 1 capsule (50 mg total) by mouth 3 (three) times daily as needed. Patient taking differently: Take 50 mg by mouth 3 (three) times daily as needed (for pain.).  09/15/10   Marletta Lor, MD  ipratropium (ATROVENT) 0.03 % nasal spray Place 1 spray into the nose daily as needed. 06/23/16   [provider]  magnesium oxide (MAG-OX) 400 (241.3 Mg) MG tablet  07/19/17   [provider]  magnesium oxide  (MAG-OX) 400 MG tablet Take 400 mg by mouth 2 (two) times daily.    [provider]  methocarbamol (ROBAXIN) 750 MG tablet Take 750 mg by mouth every 8 (eight) hours as needed (for muscle spasm.).     [provider]  morphine (MS CONTIN) 15 MG 12 hr tablet Take 15 mg by mouth every 12 (twelve) hours.    [provider]  Multiple Vitamin (MULTIVITAMIN) tablet Take 1 tablet by mouth daily.    [provider]  neomycin-polymyxin-hydrocortisone (CORTISPORIN) OTIC solution Apply 1-2 drops to toe after soaking BID 07/11/18   Wallene Huh, DPM  oxyCODONE (OXY IR/ROXICODONE) 5 MG immediate release tablet Take 1 tablet (  5 mg total) by mouth every 4 (four) hours as needed for severe pain. In addition to current chronic pain medication Patient taking differently: Take 5 mg by mouth every 4 (four) hours as needed for severe pain (Patient taking two tablets). In addition to current chronic pain medication 06/15/15   Loni Dolly, PA-C  sildenafil (VIAGRA) 100 MG tablet Take 100 mg by mouth daily as needed for erectile dysfunction.    [provider]  tiZANidine (ZANAFLEX) 2 MG tablet 1/4-1 tablet up to 4 times a day 05/15/18   [provider]  triamcinolone cream (KENALOG) 0.1 % Apply 1 application topically 2 (two) times daily. 08/24/15   Marletta Lor, MD  UNABLE TO FIND Tumeric    [provider]    Family History History reviewed. No pertinent family history.  Social History Social History   Tobacco Use  . Smoking status: Never Smoker  . Smokeless tobacco: Never Used  Substance Use Topics  . Alcohol use: Yes    Alcohol/week: 22.0 standard drinks    Types: 10 Standard drinks or equivalent, 12 Cans of beer per week    Comment: daily  . Drug use: No     Allergies   No known allergies   Review of Systems Review of Systems  Constitutional: Negative.  Negative for chills and fever.  Respiratory: Positive for shortness of  breath (Occasional and orthopnea occasional). Negative for cough.   Cardiovascular: Positive for leg swelling. Negative for chest pain and palpitations.  Musculoskeletal: Positive for myalgias (RLE).  All other systems reviewed and are negative.  Physical Exam Updated Vital Signs BP 127/82   Pulse (!) 111   Temp 98.5 F (36.9 C) (Oral)   Resp (!) 24   Ht 6\' 2"  (1.88 m)   Wt 93 kg   SpO2 97%   BMI 26.32 kg/m   Physical Exam Constitutional:      General: He is not in acute distress.    Appearance: Normal appearance. He is well-developed. He is not ill-appearing or diaphoretic.  HENT:     Head: Normocephalic and atraumatic.     Right Ear: External ear normal.     Left Ear: External ear normal.     Nose: Nose normal.  Eyes:     General: Vision grossly intact. Gaze aligned appropriately.     Pupils: Pupils are equal, round, and reactive to light.  Neck:     Musculoskeletal: Normal range of motion.     Trachea: Trachea and phonation normal. No tracheal deviation.  Cardiovascular:     Rate and Rhythm: Tachycardia present. Rhythm irregular.     Pulses:          Dorsalis pedis pulses are detected w/ Doppler on the right side and 1+ on the left side.     Heart sounds: Normal heart sounds.  Pulmonary:     Effort: Pulmonary effort is normal. No respiratory distress.     Breath sounds: Normal breath sounds. No wheezing, rhonchi or rales.  Chest:     Chest wall: No deformity, tenderness or crepitus.  Abdominal:     General: There is no distension.     Palpations: Abdomen is soft.     Tenderness: There is no abdominal tenderness. There is no guarding or rebound.  Musculoskeletal: Normal range of motion.     Right lower leg: He exhibits tenderness. 3+ Edema present.     Left lower leg: He exhibits no tenderness. 3+ Edema present.  Feet:  Right foot:     Protective Sensation: 3 sites tested. 3 sites sensed.     Left foot:     Protective Sensation: 3 sites tested. 3 sites  sensed.  Skin:    General: Skin is warm and dry.          Comments: Pinpoint weeping area to right entire lower leg Color appears equal bilaterally. Tactile temperature equal bilaterally Swelling equal bilaterally  Neurological:     Mental Status: He is alert.     GCS: GCS eye subscore is 4. GCS verbal subscore is 5. GCS motor subscore is 6.     Comments: Speech is clear and goal oriented, follows commands Major Cranial nerves without deficit, no facial droop Moves extremities without ataxia, coordination intact  Psychiatric:        Behavior: Behavior normal.    ED Treatments / Results  Labs (all labs ordered are listed, but only abnormal results are displayed) Labs Reviewed  CBC WITH DIFFERENTIAL/PLATELET - Abnormal; Notable for the following components:      Result Value   RBC 3.27 (*)    Hemoglobin 11.9 (*)    HCT 34.3 (*)    MCV 104.9 (*)    MCH 36.4 (*)    All other components within normal limits  BASIC METABOLIC PANEL - Abnormal; Notable for the following components:   Glucose, Bld 122 (*)    All other components within normal limits  BRAIN NATRIURETIC PEPTIDE - Abnormal; Notable for the following components:   B Natriuretic Peptide 234.9 (*)    All other components within normal limits  MAGNESIUM - Abnormal; Notable for the following components:   Magnesium 1.6 (*)    All other components within normal limits  D-DIMER, QUANTITATIVE (NOT AT [facility]) - Abnormal; Notable for the following components:   D-Dimer, Quant 0.57 (*)    All other components within normal limits  HEPATIC FUNCTION PANEL - Abnormal; Notable for the following components:   Albumin 3.3 (*)    AST 68 (*)    Bilirubin, Direct 0.3 (*)    All other components within normal limits  URINALYSIS, ROUTINE W REFLEX MICROSCOPIC - Abnormal; Notable for the following components:   Color, Urine STRAW (*)    Specific Gravity, Urine 1.003 (*)    All other components within normal limits  SARS CORONAVIRUS 2  (HOSPITAL ORDER, [place] [facility])  TSH  HEPARIN LEVEL (UNFRACTIONATED)  CBC  TROPONIN I (HIGH SENSITIVITY)    EKG EKG Interpretation  Date/Time:  Friday October 11 2018 18:05:38 EDT Ventricular Rate:  146 PR Interval:    QRS Duration: 101 QT Interval:  318 QTC Calculation: 496 R Axis:   44 Text Interpretation:  Atrial fibrillation RSR' in V1 or V2, right VCD or RVH ST depression, probably rate related Borderline prolonged QT interval Confirmed by Lennice Sites [phone]) on 10/11/2018 6:11:48 PM   Radiology Dg Chest Portable 1 View  Result Date: 10/11/2018 CLINICAL DATA:  Atrial fibrillation. Orthopnea. Shortness of breath. EXAM: PORTABLE CHEST 1 VIEW COMPARISON:  June 15, 2016 FINDINGS: The heart size and mediastinal contours are within normal limits. Both lungs are clear. The visualized skeletal structures are unremarkable. IMPRESSION: No active disease. Electronically Signed   By: Dorise Bullion III M.D   On: 10/11/2018 19:47    Procedures .Critical Care Performed by: Deliah Boston, PA-C Authorized by: Deliah Boston, PA-C   Critical care provider statement:    Critical care time (minutes):  40  Critical care was necessary to treat or prevent imminent or life-threatening deterioration of the following conditions:  Circulatory failure (Afib RVR rate 150-160 requiring cardizem drip)   Critical care was time spent personally by me on the following activities:  Discussions with consultants, evaluation of patient's response to treatment, examination of patient, ordering and performing treatments and interventions, ordering and review of laboratory studies, ordering and review of radiographic studies, pulse oximetry, re-evaluation of patient's condition, obtaining history from patient or surrogate, review of old charts and development of treatment plan with patient or surrogate   (including critical care time)  Medications Ordered in ED Medications   diltiazem (CARDIZEM) 1 mg/mL load via infusion 10 mg (10 mg Intravenous Bolus from Bag 10/11/18 1949)    And  diltiazem (CARDIZEM) 100 mg in dextrose 5% 148mL (1 mg/mL) infusion (5 mg/hr Intravenous New Bag/Given 10/11/18 1952)  magnesium sulfate IVPB 1 g 100 mL (has no administration in time range)  heparin bolus via infusion 5,000 Units (has no administration in time range)  heparin ADULT infusion 100 units/mL (25000 units/241mL sodium chloride 0.45%) (has no administration in time range)   Initial Impression / Assessment and Plan / ED Course  I have reviewed the triage vital signs and the nursing notes.  Pertinent labs & imaging results that were available during my care of the patient were reviewed by me and considered in my medical decision making (see chart for details).  Clinical Course as of Oct 11 2315  Fri Oct 11, 2018  2302 Dr. Hal Hope   [BM]    Clinical Course User Index [BM] Nuala Alpha A, PA-C   COVID negative LFTs nonacute Urinalysis nonacute D-dimer age-adjusted within normal limits BNP elevated at 234.9 Magnesium 1.6, will replace here in the ED BMP nonacute CBC nonacute High-sensitivity troponin: 11 TSH within normal limits Chest x-ray:  IMPRESSION:  No active disease.   EKG:  Atrial fibrillation RSR' in V1 or V2, right VCD or RVH ST depression, probably rate related Borderline prolonged QT interval Confirmed by Lennice Sites [phone]) on 10/11/2018 6:11:48 PM --------------- Patient's tachycardia improved with Cardizem drip, and he is now at approximately 110 bpm.  He remains chest pain-free and without shortness of breath here in the ED.  No clear etiology of his atrial fibrillation with RVR at this time.  We will seek hospitalist admission for further work-up.  Additionally with age-adjusted d-dimer normal will defer further work-up and DVT study to hospitalist service. - Discussed case with Dr. Hal Hope from hospitalist service is asked that we begin  patient on heparin here in the ED, hospitalist service to see patient.  Patient seen and evaluated by Dr. Ronnald Nian during this visit.  Note: Portions of this report may have been transcribed using voice recognition software. Every effort was made to ensure accuracy; however, inadvertent computerized transcription errors may still be present. Final Clinical Impressions(s) / ED Diagnoses   Final diagnoses:  Atrial fibrillation with RVR [facility])    ED Discharge Orders    None       Gari Crown 10/11/18 2317    Lennice Sites, DO 10/12/18 0030

## 2018-10-11 NOTE — Progress Notes (Signed)
Lower extremity venous duplex order received. Patient is currently in the waiting room. Attempted to call [phone] and [phone] to room patient three times with no answer. Will try to call again as schedule permits. Patient must be roomed, on a stretcher, with pants/shorts removed in order to complete exam.  10/11/2018 4:56 PM Maudry Mayhew, MHA, RVT, RDCS, RDMS

## 2018-10-11 NOTE — ED Triage Notes (Signed)
Pt reports bilateral lower leg swelling for over 1 month. Pt reports pain to R calf, foot and ankle. Pt sent by MD to rule-out DVT.

## 2018-10-11 NOTE — Patient Instructions (Addendum)
Edema  Edema is an abnormal buildup of fluids in the body tissues and under the skin. Swelling of the legs, feet, and ankles is a common symptom that becomes more likely as you get older. Swelling is also common in looser tissues, like around the eyes. When the affected area is squeezed, the fluid may move out of that spot and leave a dent for a few moments. This dent is called pitting edema. There are many possible causes of edema. Eating too much salt (sodium) and being on your feet or sitting for a long time can cause edema in your legs, feet, and ankles. Hot weather may make edema worse. Common causes of edema include:  Heart failure.  Liver or kidney disease.  Weak leg blood vessels.  Cancer.  An injury.  Pregnancy.  Medicines.  Being obese.  Low protein levels in the blood. Edema is usually painless. Your skin may look swollen or shiny. Follow these instructions at home:  Keep the affected body part raised (elevated) above the level of your heart when you are sitting or lying down.  Do not sit still or stand for long periods of time.  Do not wear tight clothing. Do not wear garters on your upper legs.  Exercise your legs to get your circulation going. This helps to move the fluid back into your blood vessels, and it may help the swelling go down.  Wear elastic bandages or support stockings to reduce swelling as told by your health care provider.  Eat a low-salt (low-sodium) diet to reduce fluid as told by your health care provider.  Depending on the cause of your swelling, you may need to limit how much fluid you drink (fluid restriction).  Take over-the-counter and prescription medicines only as told by your health care provider. Contact a health care provider if:  Your edema does not get better with treatment.  You have heart, liver, or kidney disease and have symptoms of edema.  You have sudden and unexplained weight gain. Get help right away if:  You develop  shortness of breath or chest pain.  You cannot breathe when you lie down.  You develop pain, redness, or warmth in the swollen areas.  You have heart, liver, or kidney disease and suddenly get edema.  You have a fever and your symptoms suddenly get worse. Summary  Edema is an abnormal buildup of fluids in the body tissues and under the skin.  Eating too much salt (sodium) and being on your feet or sitting for a long time can cause edema in your legs, feet, and ankles.  Keep the affected body part raised (elevated) above the level of your heart when you are sitting or lying down. This information is not intended to replace advice given to you by your health care provider. Make sure you discuss any questions you have with your health care provider. Document Released: 03/20/2005 Document Revised: 03/23/2017 Document Reviewed: 04/22/2016 Elsevier Patient Education  [place]? An infection that lies within the keratin of your nail plate that is caused by a fungus.  WHY ME? Fungal infections affect all ages, sexes, races, and creeds.  There may be many factors that predispose you to a fungal infection such as age, coexisting medical conditions such as diabetes, or an autoimmune disease; stress, medications, fatigue, genetics, etc.  Bottom line: fungus thrives in a warm, moist environment and your shoes offer such a location.  IS IT CONTAGIOUS? Theoretically, yes.  You do not want to share shoes, nail clippers or files with someone who has fungal toenails.  Walking around barefoot in the same room or sleeping in the same bed is unlikely to transfer the organism.  It is important to realize, however, that fungus can spread easily from one nail to the next on the same foot.  HOW DO WE TREAT THIS?  There are several ways to treat this condition.  Treatment may depend on many factors such as age, medications, pregnancy, liver and kidney  conditions, etc.  It is best to ask your doctor which options are available to you.  1. No treatment.   Unlike many other medical concerns, you can live with this condition.  However for many people this can be a painful condition and may lead to ingrown toenails or a bacterial infection.  It is recommended that you keep the nails cut short to help reduce the amount of fungal nail. 2. Topical treatment.  These range from herbal remedies to prescription strength nail lacquers.  About 40-50% effective, topicals require twice daily application for approximately 9 to 12 months or until an entirely new nail has grown out.  The most effective topicals are medical grade medications available through physicians offices. 3. Oral antifungal medications.  With an 80-90% cure rate, the most common oral medication requires 3 to 4 months of therapy and stays in your system for a year as the new nail grows out.  Oral antifungal medications do require blood work to make sure it is a safe drug for you.  A liver function panel will be performed prior to starting the medication and after the first month of treatment.  It is important to have the blood work performed to avoid any harmful side effects.  In general, this medication safe but blood work is required. 4. Laser Therapy.  This treatment is performed by applying a specialized laser to the affected nail plate.  This therapy is noninvasive, fast, and non-painful.  It is not covered by insurance and is therefore, out of pocket.  The results have been very good with a 80-95% cure rate.  [facility] is the only practice in the area to offer this therapy. 5. Permanent Nail Avulsion.  Removing the entire nail so that a new nail will not grow back.

## 2018-10-11 NOTE — ED Notes (Signed)
The pt has had   Swollen bi-lateral lower legs for one month  Sent from a foot doctor to be seen.  He is c/o lower back pain and leg pain at present vasular person on the way to do his exam

## 2018-10-11 NOTE — Progress Notes (Signed)
ANTICOAGULATION CONSULT NOTE - Initial Consult  Pharmacy Consult for heparin Indication: r/o VTE  Allergies  Allergen Reactions  . No Known Allergies     Patient Measurements: Height: 6\' 2"  (188 cm) Weight: 205 lb (93 kg) IBW/kg (Calculated) : 82.2  Vital Signs: Temp: 98.5 F (36.9 C) (07/10 1604) Temp Source: Oral (07/10 1604) BP: 127/82 (07/10 2200) Pulse Rate: 111 (07/10 2145)  Labs: Recent Labs    10/11/18 2028  HGB 11.9*  HCT 34.3*  PLT 161  CREATININE 0.80  TROPONINIHS 11    Estimated Creatinine Clearance: 95.6 mL/min (by C-G formula based on SCr of 0.8 mg/dL).   Medical History: Past Medical History:  Diagnosis Date  . Chronic back pain    buldging disc  . Depression   . GERD (gastroesophageal reflux disease)   . GOUT    takes Allopurinol daily and Colchicine as needed  . History of bronchitis    many yrs ago  . History of colonoscopy    benign  . History of shingles   . Joint pain   . Joint swelling   . Muscle spasm    takes Robaxin as needed  . Nocturia   . OSTEOARTHRITIS   . Peripheral edema    takes Lasix daily as needed  . Pneumonia 1969  . Seasonal allergies    takes Zyrtec and uses Flonase is needed  . TESTICULAR HYPOFUNCTION   . Zenker diverticula     Assessment: 73yo male was sent to ED by podiatry for evaluation of possible DVT, pt c/o BLE swelling x67mo, D-dimer found to be elevated, awaiting venous duplex, to begin heparin.  Goal of Therapy:  Heparin level 0.3-0.7 units/ml Monitor platelets by anticoagulation protocol: Yes   Plan:  Will give heparin 5000 units IV bolus x1 followed by gtt at 1600 units/hr and monitor heparin levels and CBC.  Wynona Neat, PharmD, BCPS  10/11/2018,11:01 PM

## 2018-10-12 ENCOUNTER — Observation Stay (HOSPITAL_COMMUNITY): Payer: Medicare Other

## 2018-10-12 ENCOUNTER — Encounter (HOSPITAL_COMMUNITY): Payer: Self-pay | Admitting: Internal Medicine

## 2018-10-12 DIAGNOSIS — G8929 Other chronic pain: Secondary | ICD-10-CM | POA: Diagnosis present

## 2018-10-12 DIAGNOSIS — Z1159 Encounter for screening for other viral diseases: Secondary | ICD-10-CM | POA: Diagnosis not present

## 2018-10-12 DIAGNOSIS — R609 Edema, unspecified: Secondary | ICD-10-CM

## 2018-10-12 DIAGNOSIS — Z7951 Long term (current) use of inhaled steroids: Secondary | ICD-10-CM | POA: Diagnosis not present

## 2018-10-12 DIAGNOSIS — L97819 Non-pressure chronic ulcer of other part of right lower leg with unspecified severity: Secondary | ICD-10-CM | POA: Diagnosis present

## 2018-10-12 DIAGNOSIS — I509 Heart failure, unspecified: Secondary | ICD-10-CM

## 2018-10-12 DIAGNOSIS — M549 Dorsalgia, unspecified: Secondary | ICD-10-CM | POA: Diagnosis present

## 2018-10-12 DIAGNOSIS — F1010 Alcohol abuse, uncomplicated: Secondary | ICD-10-CM | POA: Diagnosis present

## 2018-10-12 DIAGNOSIS — I4892 Unspecified atrial flutter: Secondary | ICD-10-CM | POA: Diagnosis present

## 2018-10-12 DIAGNOSIS — K219 Gastro-esophageal reflux disease without esophagitis: Secondary | ICD-10-CM | POA: Diagnosis present

## 2018-10-12 DIAGNOSIS — D539 Nutritional anemia, unspecified: Secondary | ICD-10-CM | POA: Diagnosis present

## 2018-10-12 DIAGNOSIS — Z79899 Other long term (current) drug therapy: Secondary | ICD-10-CM | POA: Diagnosis not present

## 2018-10-12 DIAGNOSIS — I4891 Unspecified atrial fibrillation: Secondary | ICD-10-CM | POA: Diagnosis present

## 2018-10-12 DIAGNOSIS — Z7982 Long term (current) use of aspirin: Secondary | ICD-10-CM | POA: Diagnosis not present

## 2018-10-12 DIAGNOSIS — Z79891 Long term (current) use of opiate analgesic: Secondary | ICD-10-CM | POA: Diagnosis not present

## 2018-10-12 DIAGNOSIS — I878 Other specified disorders of veins: Secondary | ICD-10-CM | POA: Diagnosis present

## 2018-10-12 DIAGNOSIS — M109 Gout, unspecified: Secondary | ICD-10-CM | POA: Diagnosis present

## 2018-10-12 DIAGNOSIS — Z7952 Long term (current) use of systemic steroids: Secondary | ICD-10-CM | POA: Diagnosis not present

## 2018-10-12 DIAGNOSIS — G894 Chronic pain syndrome: Secondary | ICD-10-CM | POA: Diagnosis not present

## 2018-10-12 DIAGNOSIS — M1000 Idiopathic gout, unspecified site: Secondary | ICD-10-CM | POA: Diagnosis not present

## 2018-10-12 DIAGNOSIS — I5031 Acute diastolic (congestive) heart failure: Secondary | ICD-10-CM | POA: Diagnosis present

## 2018-10-12 LAB — CBC WITH DIFFERENTIAL/PLATELET
Abs Immature Granulocytes: 0.01 10*3/uL (ref 0.00–0.07)
Basophils Absolute: 0 10*3/uL (ref 0.0–0.1)
Basophils Relative: 1 %
Eosinophils Absolute: 0 10*3/uL (ref 0.0–0.5)
Eosinophils Relative: 0 %
HCT: 34.4 % — ABNORMAL LOW (ref 39.0–52.0)
Hemoglobin: 11.8 g/dL — ABNORMAL LOW (ref 13.0–17.0)
Immature Granulocytes: 0 %
Lymphocytes Relative: 29 %
Lymphs Abs: 1.7 10*3/uL (ref 0.7–4.0)
MCH: 36.2 pg — ABNORMAL HIGH (ref 26.0–34.0)
MCHC: 34.3 g/dL (ref 30.0–36.0)
MCV: 105.5 fL — ABNORMAL HIGH (ref 80.0–100.0)
Monocytes Absolute: 0.6 10*3/uL (ref 0.1–1.0)
Monocytes Relative: 11 %
Neutro Abs: 3.4 10*3/uL (ref 1.7–7.7)
Neutrophils Relative %: 59 %
Platelets: 170 10*3/uL (ref 150–400)
RBC: 3.26 MIL/uL — ABNORMAL LOW (ref 4.22–5.81)
RDW: 12 % (ref 11.5–15.5)
WBC: 5.8 10*3/uL (ref 4.0–10.5)
nRBC: 0 % (ref 0.0–0.2)

## 2018-10-12 LAB — RETICULOCYTES
Immature Retic Fract: 20.4 % — ABNORMAL HIGH (ref 2.3–15.9)
RBC.: 3.07 MIL/uL — ABNORMAL LOW (ref 4.22–5.81)
Retic Count, Absolute: 60.5 10*3/uL (ref 19.0–186.0)
Retic Ct Pct: 2 % (ref 0.4–3.1)

## 2018-10-12 LAB — COMPREHENSIVE METABOLIC PANEL
ALT: 32 U/L (ref 0–44)
AST: 59 U/L — ABNORMAL HIGH (ref 15–41)
Albumin: 3.4 g/dL — ABNORMAL LOW (ref 3.5–5.0)
Alkaline Phosphatase: 113 U/L (ref 38–126)
Anion gap: 14 (ref 5–15)
BUN: 6 mg/dL — ABNORMAL LOW (ref 8–23)
CO2: 24 mmol/L (ref 22–32)
Calcium: 8.9 mg/dL (ref 8.9–10.3)
Chloride: 102 mmol/L (ref 98–111)
Creatinine, Ser: 0.75 mg/dL (ref 0.61–1.24)
GFR calc Af Amer: >60 mL/min (ref >60)
GFR calc non Af Amer: >60 mL/min (ref >60)
Glucose, Bld: 108 mg/dL — ABNORMAL HIGH (ref 70–99)
Potassium: 3.7 mmol/L (ref 3.5–5.1)
Sodium: 140 mmol/L (ref 135–145)
Total Bilirubin: 1.2 mg/dL (ref 0.3–1.2)
Total Protein: 6.7 g/dL (ref 6.5–8.1)

## 2018-10-12 LAB — TROPONIN I (HIGH SENSITIVITY)
Troponin I (High Sensitivity): 14 ng/L (ref <18)
Troponin I (High Sensitivity): 14 ng/L (ref <18)

## 2018-10-12 LAB — IRON AND TIBC
Iron: 77 ug/dL (ref 45–182)
Saturation Ratios: 29 % (ref 17.9–39.5)
TIBC: 263 ug/dL (ref 250–450)
UIBC: 186 ug/dL

## 2018-10-12 LAB — TSH: TSH: 2.524 u[IU]/mL (ref 0.350–4.500)

## 2018-10-12 LAB — HEPARIN LEVEL (UNFRACTIONATED)
Heparin Unfractionated: 0.21 IU/mL — ABNORMAL LOW (ref 0.30–0.70)
Heparin Unfractionated: 0.25 IU/mL — ABNORMAL LOW (ref 0.30–0.70)

## 2018-10-12 LAB — ECHOCARDIOGRAM COMPLETE
Height: 73 in
Weight: 3305.14 oz

## 2018-10-12 LAB — FOLATE: Folate: 27.6 ng/mL (ref >5.9)

## 2018-10-12 LAB — VITAMIN B12: Vitamin B-12: 1600 pg/mL — ABNORMAL HIGH (ref 180–914)

## 2018-10-12 LAB — FERRITIN: Ferritin: 233 ng/mL (ref 24–336)

## 2018-10-12 MED ORDER — MORPHINE SULFATE ER 15 MG PO TBCR
15.0000 mg | EXTENDED_RELEASE_TABLET | Freq: Two times a day (BID) | ORAL | Status: DC
  Administered 2018-10-12 – 2018-10-14 (×5): 15 mg via ORAL
  Filled 2018-10-12 (×5): qty 1

## 2018-10-12 MED ORDER — VITAMIN B-12 1000 MCG PO TABS
2000.0000 ug | ORAL_TABLET | Freq: Every day | ORAL | Status: DC
  Administered 2018-10-12 – 2018-10-14 (×3): 2000 ug via ORAL
  Filled 2018-10-12 (×3): qty 2

## 2018-10-12 MED ORDER — HEPARIN BOLUS VIA INFUSION
1400.0000 [IU] | Freq: Once | INTRAVENOUS | Status: AC
  Administered 2018-10-12: 1400 [IU] via INTRAVENOUS
  Filled 2018-10-12: qty 1400

## 2018-10-12 MED ORDER — DILTIAZEM HCL ER COATED BEADS 120 MG PO CP24
120.0000 mg | ORAL_CAPSULE | ORAL | Status: AC
  Administered 2018-10-12: 11:00:00 120 mg via ORAL
  Filled 2018-10-12: qty 1

## 2018-10-12 MED ORDER — ACETAMINOPHEN 650 MG RE SUPP: 650.0000 mg | Freq: Four times a day (QID) | RECTAL | Status: DC | PRN

## 2018-10-12 MED ORDER — OXYCODONE HCL 5 MG PO TABS
10.0000 mg | ORAL_TABLET | ORAL | Status: DC | PRN
  Administered 2018-10-12 – 2018-10-14 (×16): 10 mg via ORAL
  Filled 2018-10-12 (×16): qty 2

## 2018-10-12 MED ORDER — FOLIC ACID 1 MG PO TABS
1.0000 mg | ORAL_TABLET | Freq: Every day | ORAL | Status: DC
  Administered 2018-10-12 – 2018-10-14 (×3): 1 mg via ORAL
  Filled 2018-10-12 (×3): qty 1

## 2018-10-12 MED ORDER — ACETAMINOPHEN 325 MG PO TABS: 650.0000 mg | ORAL_TABLET | Freq: Four times a day (QID) | ORAL | Status: DC | PRN

## 2018-10-12 MED ORDER — ONDANSETRON HCL 4 MG PO TABS: 4.0000 mg | ORAL_TABLET | Freq: Four times a day (QID) | ORAL | Status: DC | PRN

## 2018-10-12 MED ORDER — THIAMINE HCL 100 MG/ML IJ SOLN: 100.0000 mg | Freq: Every day | INTRAMUSCULAR | Status: DC

## 2018-10-12 MED ORDER — ONDANSETRON HCL 4 MG/2ML IJ SOLN: 4.0000 mg | Freq: Four times a day (QID) | INTRAMUSCULAR | Status: DC | PRN

## 2018-10-12 MED ORDER — ALLOPURINOL 300 MG PO TABS
300.0000 mg | ORAL_TABLET | Freq: Every day | ORAL | Status: DC
  Administered 2018-10-12 – 2018-10-14 (×3): 300 mg via ORAL
  Filled 2018-10-12 (×3): qty 1

## 2018-10-12 MED ORDER — POTASSIUM CHLORIDE CRYS ER 20 MEQ PO TBCR
20.0000 meq | EXTENDED_RELEASE_TABLET | Freq: Every day | ORAL | Status: DC
  Administered 2018-10-12 – 2018-10-13 (×2): 20 meq via ORAL
  Filled 2018-10-12 (×2): qty 1

## 2018-10-12 MED ORDER — MAGNESIUM OXIDE 400 (241.3 MG) MG PO TABS
400.0000 mg | ORAL_TABLET | Freq: Every day | ORAL | Status: DC
  Administered 2018-10-12 – 2018-10-14 (×3): 400 mg via ORAL
  Filled 2018-10-12 (×3): qty 1

## 2018-10-12 MED ORDER — ONE-DAILY MULTI VITAMINS PO TABS: 1.0000 | ORAL_TABLET | Freq: Every day | ORAL | Status: DC

## 2018-10-12 MED ORDER — FUROSEMIDE 40 MG PO TABS
40.0000 mg | ORAL_TABLET | Freq: Every day | ORAL | Status: DC
  Administered 2018-10-12 – 2018-10-13 (×2): 40 mg via ORAL
  Filled 2018-10-12 (×2): qty 1

## 2018-10-12 MED ORDER — DILTIAZEM HCL ER COATED BEADS 240 MG PO CP24
240.0000 mg | ORAL_CAPSULE | Freq: Every day | ORAL | Status: DC
  Administered 2018-10-13 – 2018-10-14 (×2): 240 mg via ORAL
  Filled 2018-10-12 (×2): qty 1

## 2018-10-12 MED ORDER — CALCIUM CARBONATE 1250 (500 CA) MG PO TABS
500.0000 mg | ORAL_TABLET | Freq: Two times a day (BID) | ORAL | Status: DC
  Administered 2018-10-12 – 2018-10-14 (×5): 500 mg via ORAL
  Filled 2018-10-12 (×5): qty 1

## 2018-10-12 MED ORDER — DILTIAZEM HCL ER COATED BEADS 120 MG PO CP24
120.0000 mg | ORAL_CAPSULE | Freq: Every day | ORAL | Status: DC
  Administered 2018-10-12: 06:00:00 120 mg via ORAL
  Filled 2018-10-12: qty 1

## 2018-10-12 MED ORDER — METOPROLOL TARTRATE 5 MG/5ML IV SOLN: 5.0000 mg | Freq: Four times a day (QID) | INTRAVENOUS | Status: DC | PRN

## 2018-10-12 MED ORDER — LORAZEPAM 1 MG PO TABS
1.0000 mg | ORAL_TABLET | Freq: Four times a day (QID) | ORAL | Status: DC | PRN
  Administered 2018-10-12: 23:00:00 1 mg via ORAL
  Filled 2018-10-12: qty 1

## 2018-10-12 MED ORDER — MORPHINE SULFATE ER 15 MG PO TBCR
15.0000 mg | EXTENDED_RELEASE_TABLET | Freq: Two times a day (BID) | ORAL | Status: DC
  Administered 2018-10-12: 01:00:00 15 mg via ORAL
  Filled 2018-10-12 (×2): qty 1

## 2018-10-12 MED ORDER — METOPROLOL TARTRATE 5 MG/5ML IV SOLN
5.0000 mg | Freq: Four times a day (QID) | INTRAVENOUS | Status: DC | PRN
  Administered 2018-10-12: 19:00:00 5 mg via INTRAVENOUS
  Filled 2018-10-12: qty 5

## 2018-10-12 MED ORDER — DILTIAZEM HCL ER COATED BEADS 240 MG PO CP24: 240.0000 mg | ORAL_CAPSULE | Freq: Every day | ORAL | Status: DC

## 2018-10-12 MED ORDER — VITAMIN B-1 100 MG PO TABS
100.0000 mg | ORAL_TABLET | Freq: Every day | ORAL | Status: DC
  Administered 2018-10-12 – 2018-10-14 (×3): 100 mg via ORAL
  Filled 2018-10-12 (×3): qty 1

## 2018-10-12 MED ORDER — ADULT MULTIVITAMIN W/MINERALS CH
1.0000 | ORAL_TABLET | Freq: Every day | ORAL | Status: DC
  Administered 2018-10-12 – 2018-10-14 (×3): 1 via ORAL
  Filled 2018-10-12 (×3): qty 1

## 2018-10-12 MED ORDER — LORAZEPAM 2 MG/ML IJ SOLN: 1.0000 mg | Freq: Four times a day (QID) | INTRAMUSCULAR | Status: DC | PRN

## 2018-10-12 NOTE — ED Notes (Signed)
bp  151/82 p114

## 2018-10-12 NOTE — Progress Notes (Signed)
  Late entry:  At Approximately 1430 RN notified of patient's HR 130-140's.  Patient just completed bath and was up moving around room.  Patient upset over needing to call for PRN oxyIR as he takes it scheduled at home.  Patient helped back to bed and HR back to 90's-low 100's.  Patient educated on how to request PRN medications at times they are due.  Time for PRN medication added to white board as visual reminder.  MD did not want to change prn medication schedule.  Patient also educated(again) to use urinal for strict I&O's.    Richardson Dopp, PA notified.  He stated to ambulate patient in hallway but HR already up with movement in room.    Dr.  Earnest Conroy ordered IV lopressor for hypertension.  MD called for clarification.  At this time Patient's HR was up 130-140's again (patient sitting up on side of bed eating).  Parameters received and order modified in Epic.  5 IV lopressor given HR decreased to 90-110's.

## 2018-10-12 NOTE — Progress Notes (Signed)
Please see H&P from this morning.  73 year old male with history of chronic alcohol abuse, chronic pain on chronic opiates at baseline (morphine and oxycodone), gout, chronic venous insufficiency who has been recently experiencing worsening leg swellings and dyspnea and who went to see a podiatrist yesterday for ulceration on anterior shin was referred to the ED for evaluation of leg swellings and to rule out DVT.  Patient in A. fib with RVR on presentation to the ED.  Initiated on Cardizem drip, now transition to oral Cardizem.  He reports drinking 3 cans of beer and wine occasionally with dinner.  Denies any prior history of alcohol withdrawal or intubations or seizures.  He has been evaluated by cardiology for new onset A. fib/flutter.  Telemetry currently showing a flutter at heart rate 100- 120.  P.o. Cardizem dose has been increased to 40 mg by cardiology.  Doppler lower extremity negative for DVT.  Patient on heparin drip and cardiology evaluating stroke risk (Echo pending) and need for long-term anticoagulation. Will have IV metoprolol PRN available for tachycardia episodes overnight.

## 2018-10-12 NOTE — Progress Notes (Signed)
[place] for heparin Indication: r/o VTE; afib  Allergies  Allergen Reactions  . No Known Allergies     Patient Measurements: Height: 6\' 1"  (185.4 cm) Weight: 206 lb 9.1 oz (93.7 kg) IBW/kg (Calculated) : 79.9  Vital Signs: Temp: 97.8 F (36.6 C) (07/11 1416) Temp Source: Oral (07/11 1416) BP: 134/94 (07/11 1416) Pulse Rate: 139 (07/11 1416)  Labs: Recent Labs    10/11/18 2028 10/12/18 0445 10/12/18 0542 10/12/18 0612 10/12/18 1256  HGB 11.9* 11.8*  --   --   --   HCT 34.3* 34.4*  --   --   --   PLT 161 170  --   --   --   HEPARINUNFRC  --   --  0.21*  --  0.25*  CREATININE 0.80 0.75  --   --   --   TROPONINIHS 11 14  --  14  --     Estimated Creatinine Clearance: 92.9 mL/min (by C-G formula based on SCr of 0.75 mg/dL).  Assessment: 73yo male was sent to ED by podiatry for evaluation of possible DVT, pt c/o BLE swelling x44mo, D-dimer found to be elevated. Found to be in Afib RVR. 7/11 prelim US--negative for DVT. CBC stable. Heparin level 0.25. No bleeding or infusion issues per the RN.    Goal of Therapy:  Heparin level 0.3-0.7 units/ml Monitor platelets by anticoagulation protocol: Yes   Plan:  - Heparin IV bolus 1400 units x1 - Rate increase to 2050 units/hr  - F/u heparin level in 8 hours  - Continue heparin for possible cardioversion - Monitor daily CBC and heparin level - Monitor for s/sx of bleeding   Thanks for allowing pharmacy to be a part of this patient's care.  Agnes Lawrence, PharmD PGY1 Pharmacy Resident

## 2018-10-12 NOTE — Progress Notes (Signed)
[place] for heparin Indication: r/o VTE  Allergies  Allergen Reactions  . No Known Allergies     Patient Measurements: Height: 6\' 1"  (185.4 cm) Weight: 206 lb 9.1 oz (93.7 kg) IBW/kg (Calculated) : 79.9  Vital Signs: Temp: 98 F (36.7 C) (07/11 0228) Temp Source: Oral (07/11 0228) BP: 161/84 (07/11 0228) Pulse Rate: 61 (07/11 0228)  Labs: Recent Labs    10/11/18 2028 10/12/18 0445 10/12/18 0542  HGB 11.9* 11.8*  --   HCT 34.3* 34.4*  --   PLT 161 170  --   HEPARINUNFRC  --   --  0.21*  CREATININE 0.80  --   --   TROPONINIHS 11 14  --     Estimated Creatinine Clearance: 92.9 mL/min (by C-G formula based on SCr of 0.8 mg/dL).  Assessment: 73yo male was sent to ED by podiatry for evaluation of possible DVT, pt c/o BLE swelling x38mo, D-dimer found to be elevated, awaiting venous duplex, to begin heparin. Initial heparin level 0.21 units/ml.  No issues noted with infusion  Goal of Therapy:  Heparin level 0.3-0.7 units/ml Monitor platelets by anticoagulation protocol: Yes   Plan:  Increase heparin drip to 1900 units/hr Check heparin level in 6 hours Monitor for bleeding complications  Thanks for allowing pharmacy to be a part of this patient's care.  Excell Seltzer, PharmD Clinical Pharmacist 10/12/2018,6:19 AM

## 2018-10-12 NOTE — Plan of Care (Signed)
  Problem: Coping: Goal: Level of anxiety will decrease Outcome: Progressing   Problem: Pain Managment: Goal: General experience of comfort will improve Outcome: Progressing   Problem: Safety: Goal: Ability to remain free from injury will improve Outcome: Progressing   

## 2018-10-12 NOTE — Consult Note (Addendum)
Cardiology Consultation:   Patient ID: EZIO WIECK MRN: [id number]; DOB: [birth year]  Admit date: 10/11/2018 Date of Consult: 10/12/2018  Primary Care Provider: Patient, No Pcp Per Primary Cardiologist: new to [facility]   Primary Electrophysiologist:  None     Patient Profile:   NICCOLAS LOEPER is a 73 y.o. male with a hx of gout, alcoholism who is being seen today for the evaluation of atrial fibrillation at the request of Dr. Hal Hope.  History of Present Illness:   Mr. Fitchett has been having increasing weight, shortness of breath with exertion and lower extremity swelling for about 2 months.  He was referred to the ED yesterday by podiatry for leg swelling, rule out DVT.  He was found to be in AFib with RVR (HR 140s).  He is currently on Diltiazem 120 mg once daily and IV Heparin.  He notes that he has had shortness of breath off an on.  He would notice it at night mainly but denies orthopnea.  He has not had paroxysmal nocturnal dyspnea, chest pain, syncope, near syncope.  He has had no awareness of atrial fibrillation.  His leg swelling has been fairly severe.  He has been able to work out in his garage in extreme heat without any issues.    CHADS2-VASc=1 (age x 1).   Heart Pathway Score:     Past Medical History:  Diagnosis Date  . Chronic back pain    buldging disc  . Depression   . GERD (gastroesophageal reflux disease)   . GOUT    takes Allopurinol daily and Colchicine as needed  . History of bronchitis    many yrs ago  . History of colonoscopy    benign  . History of shingles   . Joint pain   . Joint swelling   . Muscle spasm    takes Robaxin as needed  . Nocturia   . OSTEOARTHRITIS   . Peripheral edema    takes Lasix daily as needed  . Pneumonia 1969  . Seasonal allergies    takes Zyrtec and uses Flonase is needed  . TESTICULAR HYPOFUNCTION   . Zenker diverticula     Past Surgical History:  Procedure Laterality Date  . ANKLE SURGERY     4  times  . COLONOSCOPY    . ELBOW SURGERY Right    torn tendon  . INGUINAL HERNIA REPAIR     right  . KNEE SURGERY     3 on each knee  . SHOULDER ARTHROSCOPY Right 06/15/2015   Procedure: RIGHT SHOULDER ARTHROSCOPY, ACROMIOPLASTY, DEBRIDEMENT;  Surgeon: Melrose Nakayama, MD;  Location: [facility];  Service: Orthopedics;  Laterality: Right;  . shoulder arthrosopy Left   . TONSILLECTOMY AND ADENOIDECTOMY    . TUMOR EXCISION     sebaceous cyst removed from chest  . ZENKER'S DIVERTICULECTOMY ENDOSCOPIC N/A 06/14/2016   Procedure: Stanford Breed DIVERTICULECTOMY ENDOSCOPIC;  Surgeon: Izora Gala, MD;  Location: [facility];  Service: ENT;  Laterality: N/A;     Home Medications:  Prior to Admission medications   Medication Sig Start Date End Date Taking? Authorizing Provider  allopurinol (ZYLOPRIM) 100 MG tablet Take 5 tablets (500 mg total) by mouth daily. Patient taking differently: Take 300 mg by mouth daily.  01/19/14  Yes Marletta Lor, MD  CALCIUM 600 1500 (600 Ca) MG TABS tablet Take 600 mg of elemental calcium by mouth 2 (two) times daily.  02/16/16  Yes [provider]  Cetirizine HCl 10 MG TBDP Take  10 mg by mouth daily as needed (for allergies).    Yes [provider]  cyanocobalamin 2000 MCG tablet Take 2,000 mcg by mouth daily.   Yes [provider]  diphenoxylate-atropine (LOMOTIL) 2.5-0.025 MG tablet Take 2 tablets by mouth 4 (four) times daily as needed for diarrhea or loose stools. 08/02/17  Yes Wardell Honour, MD  indomethacin (INDOCIN) 50 MG capsule Take 1 capsule (50 mg total) by mouth 3 (three) times daily as needed. Patient taking differently: Take 50 mg by mouth 3 (three) times daily as needed (for pain.).  09/15/10  Yes Marletta Lor, MD  ipratropium (ATROVENT) 0.03 % nasal spray Place 1 spray into the nose daily as needed for rhinitis.  06/23/16  Yes [provider]  magnesium oxide (MAG-OX) 400 (241.3 Mg) MG tablet Take 400 mg by mouth  daily.  07/19/17  Yes [provider]  morphine (MS CONTIN) 15 MG 12 hr tablet Take 15 mg by mouth every 12 (twelve) hours.   Yes [provider]  Multiple Vitamin (MULTIVITAMIN) tablet Take 1 tablet by mouth daily.   Yes [provider]  oxyCODONE (OXY IR/ROXICODONE) 5 MG immediate release tablet Take 1 tablet (5 mg total) by mouth every 4 (four) hours as needed for severe pain. In addition to current chronic pain medication Patient taking differently: Take 5 mg by mouth every 4 (four) hours.  06/15/15  Yes Loni Dolly, PA-C  furosemide (LASIX) 40 MG tablet Take 1 tablet (40 mg total) by mouth daily. Patient not taking: Reported on 10/11/2018 06/28/15   Marletta Lor, MD  neomycin-polymyxin-hydrocortisone (CORTISPORIN) OTIC solution Apply 1-2 drops to toe after soaking BID Patient not taking: Reported on 10/11/2018 07/11/18   Wallene Huh, DPM  triamcinolone cream (KENALOG) 0.1 % Apply 1 application topically 2 (two) times daily. Patient not taking: Reported on 10/11/2018 08/24/15   Marletta Lor, MD    Inpatient Medications: Scheduled Meds: . allopurinol  300 mg Oral Daily  . calcium carbonate  500 mg of elemental calcium Oral BID  . diltiazem  240 mg Oral Daily  . folic acid  1 mg Oral Daily  . furosemide  40 mg Oral Daily  . magnesium oxide  400 mg Oral Daily  . morphine  15 mg Oral Q12H  . multivitamin with minerals  1 tablet Oral Daily  . potassium chloride  20 mEq Oral Daily  . thiamine  100 mg Oral Daily   Or  . thiamine  100 mg Intravenous Daily  . cyanocobalamin  2,000 mcg Oral Daily   Continuous Infusions: . heparin 1,900 Units/hr (10/12/18 5188)   PRN Meds: acetaminophen **OR** acetaminophen, LORazepam **OR** LORazepam, ondansetron **OR** ondansetron (ZOFRAN) IV, oxyCODONE  Allergies:    Allergies  Allergen Reactions  . No Known Allergies     Social History:   Social History   Socioeconomic History  . Marital status: Married     Spouse name: Not on file  . Number of children: Not on file  . Years of education: Not on file  . Highest education level: Not on file  Occupational History  . Not on file  Social Needs  . Financial resource strain: Not on file  . Food insecurity    Worry: Not on file    Inability: Not on file  . Transportation needs    Medical: Not on file    Non-medical: Not on file  Tobacco Use  . Smoking status: Never Smoker  .  Smokeless tobacco: Never Used  Substance and Sexual Activity  . Alcohol use: Yes    Alcohol/week: 22.0 standard drinks    Types: 10 Standard drinks or equivalent, 12 Cans of beer per week    Comment: daily  . Drug use: No  . Sexual activity: Not on file  Lifestyle  . Physical activity    Days per week: Not on file    Minutes per session: Not on file  . Stress: Not on file  Relationships  . Social Herbalist on phone: Not on file    Gets together: Not on file    Attends religious service: Not on file    Active member of club or organization: Not on file    Attends meetings of clubs or organizations: Not on file    Relationship status: Not on file  . Intimate partner violence    Fear of current or ex partner: Not on file    Emotionally abused: Not on file    Physically abused: Not on file    Forced sexual activity: Not on file  Other Topics Concern  . Not on file  Social History Narrative  . Not on file    Family History:    Family History  Family history unknown: Yes     ROS:  Please see the history of present illness.  All other ROS reviewed and negative.     Physical Exam/Data:   Vitals:   10/12/18 0228 10/12/18 0600 10/12/18 0841 10/12/18 0843  BP: (!) 161/84 (!) 151/76 (!) 142/103 131/85  Pulse: 61 92 98   Resp: 16     Temp: 98 F (36.7 C)   99.4 F (37.4 C)  TempSrc: Oral   Oral  SpO2: 100%  100% 100%  Weight: 93.7 kg     Height: 6\' 1"  (1.854 m)       Intake/Output Summary (Last 24 hours) at 10/12/2018 0951 Last  data filed at 10/12/2018 0400 Gross per 24 hour  Intake 416.06 ml  Output -  Net 416.06 ml   Last 3 Weights 10/12/2018 10/11/2018 06/06/2018  Weight (lbs) 206 lb 9.1 oz 205 lb 203 lb  Weight (kg) 93.7 kg 92.987 kg 92.08 kg     Body mass index is 27.25 kg/m.  General:  Well nourished, well developed, in no acute distress  HEENT: normal Lymph: no adenopathy Neck: + HJR Endocrine:  No thryomegaly Cardiac:  Irregularly irregular rhythm, no murmurs Lungs:  clear to auscultation bilaterally, no wheezing, rhonchi or rales  Abd: soft, nontender, no hepatomegaly  Ext: 2+ bilat LE edema Musculoskeletal:  No deformities  Skin: warm and dry  Neuro:  CNs 2-12 intact, no focal abnormalities noted Psych:  Normal affect   EKG:  The EKG done 10/11/2018 was personally reviewed and demonstrates:  AFib, HR 146, normal axis, ?1 mm ST depression leads V3-5, QTc 496  Telemetry:  Telemetry was personally reviewed and demonstrates:  AFib, HR 110-115  Relevant CV Studies: None   Laboratory Data:  High Sensitivity Troponin:   Recent Labs  Lab 10/11/18 2028 10/12/18 0445 10/12/18 0612  TROPONINIHS 11 14 14      Cardiac EnzymesNo results for input(s): TROPONINI in the last 168 hours. No results for input(s): TROPIPOC in the last 168 hours.  Chemistry Recent Labs  Lab 10/11/18 2028 10/12/18 0445  NA 140 140  K 4.0 3.7  CL 100 102  CO2 30 24  GLUCOSE 122* 108*  BUN 9 6*  CREATININE 0.80 0.75  CALCIUM 9.1 8.9  GFRNONAA >60 >60  GFRAA >60 >60  ANIONGAP 10 14    Recent Labs  Lab 10/11/18 2038 10/12/18 0445  PROT 7.0 6.7  ALBUMIN 3.3* 3.4*  AST 68* 59*  ALT 35 32  ALKPHOS 113 113  BILITOT 0.7 1.2   Hematology Recent Labs  Lab 10/11/18 2028 10/12/18 0445 10/12/18 0612  WBC 4.1 5.8  --   RBC 3.27* 3.26* 3.07*  HGB 11.9* 11.8*  --   HCT 34.3* 34.4*  --   MCV 104.9* 105.5*  --   MCH 36.4* 36.2*  --   MCHC 34.7 34.3  --   RDW 12.0 12.0  --   PLT 161 170  --    BNP Recent  Labs  Lab 10/11/18 2028  BNP 234.9*    DDimer  Recent Labs  Lab 10/11/18 2038  DDIMER 0.57*     Radiology/Studies:  Dg Chest Portable 1 View  Result Date: 10/11/2018 CLINICAL DATA:  Atrial fibrillation. Orthopnea. Shortness of breath. EXAM: PORTABLE CHEST 1 VIEW COMPARISON:  June 15, 2016 FINDINGS: The heart size and mediastinal contours are within normal limits. Both lungs are clear. The visualized skeletal structures are unremarkable. IMPRESSION: No active disease. Electronically Signed   By: Dorise Bullion III M.D   On: 10/11/2018 19:47    Assessment and Plan:   1. Atrial Fibrillation with RVR -  HR with marginal control.  His BP can certainly tolerate increasing Diltiazem.  Will increase to Diltiazem 240 once daily.    2. Congestive heart failure -  He has a mildly elevated BNP and lower extremity swelling, + HJR.  His echo is currently pending.  Start Lasix 40 mg once daily, K+ 20 mEq once daily.  Obtain BMET in AM. If EF is down on Echo, he will need long term anticoagulation ([place] would then be 2).      3. Alcohol abuse - He drinks 2-4 beers per day and often wine with dinner. He is on CIWA protocol.   For questions or updates, please contact [facility] Please consult [email or web address] for contact info under    Signed, Richardson Dopp, PA-C  10/12/2018 9:51 AM   Agree with Richardson Dopp PA-C  73 year old fairly active Caucasian male admitted with newly recognized A. fib and lower extremity edema.  He has no cardiac risk factors.  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  He has had swelling in his lower extremities for the last month or so.  He saw podiatrist and referred him to the emergency room where he was found to be in A. fib with RVR.  He is unaware of this.  A 2D echo has been ordered to determine LV function and guide oral anticoagulation therapy if needed.  He is also being treated with low-dose diuretic and rate control.  His exam is  benign.  He can potentially be discharged home tomorrow.  Lorretta Harp, M.D., [place], [facility], [place] [address] [place], [place]  [address]  [phone] 10/12/2018 10:17 AM

## 2018-10-12 NOTE — Progress Notes (Signed)
  Echocardiogram 2D Echocardiogram has been performed.  [name] 10/12/2018, 11:29 AM

## 2018-10-12 NOTE — H&P (Signed)
History and Physical    [name] SNK:[id number] DOB: 09-18-1945 DOA: 10/11/2018  PCP: Patient, No Pcp Per  Patient coming from: Home.  Chief Complaint: Lower extremity edema.  HPI: [name] is a 73 y.o. male with history of gout chronic pain drinks alcohol every day presents to the ER because of increasing lower extremity edema.  Patient states over the last 2 months patient has been experiencing lower extremity edema which has been progressively worsening and also has increased weight.  At times gets short of breath on exertion at times during sleeping.  Few days ago he did yard work and had no difficulties.  He also noted a small ulceration on anterior shin and had gone to the foot doctor yesterday and was referred to the ER to rule out DVT.  ED Course: In the ER patient was found to be in A. fib with RVR was started on Cardizem infusion TSH was normal.  Chest x-ray unremarkable.  On exam patient has bilateral lower extremity pitting edema extending up to the knees.  Small ulceration on the right anterior shin.  With no active discharge.  Labs revealed macrocytic anemia with hemoglobin of 11.9.  Patient's albumin is 3.3.  AST 68.  Urine largely unremarkable.  COVID-19 test negative.  Review of Systems: As per HPI, rest all negative.   Past Medical History:  Diagnosis Date  . Chronic back pain    buldging disc  . Depression   . GERD (gastroesophageal reflux disease)   . GOUT    takes Allopurinol daily and Colchicine as needed  . History of bronchitis    many yrs ago  . History of colonoscopy    benign  . History of shingles   . Joint pain   . Joint swelling   . Muscle spasm    takes Robaxin as needed  . Nocturia   . OSTEOARTHRITIS   . Peripheral edema    takes Lasix daily as needed  . Pneumonia 1969  . Seasonal allergies    takes Zyrtec and uses Flonase is needed  . TESTICULAR HYPOFUNCTION   . Zenker diverticula     Past Surgical History:  Procedure  Laterality Date  . ANKLE SURGERY     4 times  . COLONOSCOPY    . ELBOW SURGERY Right    torn tendon  . INGUINAL HERNIA REPAIR     right  . KNEE SURGERY     3 on each knee  . SHOULDER ARTHROSCOPY Right 06/15/2015   Procedure: RIGHT SHOULDER ARTHROSCOPY, ACROMIOPLASTY, DEBRIDEMENT;  Surgeon: Melrose Nakayama, MD;  Location: [facility];  Service: Orthopedics;  Laterality: Right;  . shoulder arthrosopy Left   . TONSILLECTOMY AND ADENOIDECTOMY    . TUMOR EXCISION     sebaceous cyst removed from chest  . ZENKER'S DIVERTICULECTOMY ENDOSCOPIC N/A 06/14/2016   Procedure: Stanford Breed DIVERTICULECTOMY ENDOSCOPIC;  Surgeon: Izora Gala, MD;  Location: [facility];  Service: ENT;  Laterality: N/A;     reports that he has never smoked. He has never used smokeless tobacco. He reports current alcohol use of about 22.0 standard drinks of alcohol per week. He reports that he does not use drugs.  Allergies  Allergen Reactions  . No Known Allergies     Family History  Family history unknown: Yes    Prior to Admission medications   Medication Sig Start Date End Date Taking? Authorizing Provider  allopurinol (ZYLOPRIM) 100 MG tablet Take 5 tablets (500 mg total) by mouth  daily. Patient taking differently: Take 300 mg by mouth daily.  01/19/14  Yes Marletta Lor, MD  CALCIUM 600 1500 (600 Ca) MG TABS tablet Take 600 mg of elemental calcium by mouth 2 (two) times daily.  02/16/16  Yes [provider]  Cetirizine HCl 10 MG TBDP Take 10 mg by mouth daily as needed (for allergies).    Yes [provider]  cyanocobalamin 2000 MCG tablet Take 2,000 mcg by mouth daily.   Yes [provider]  diphenoxylate-atropine (LOMOTIL) 2.5-0.025 MG tablet Take 2 tablets by mouth 4 (four) times daily as needed for diarrhea or loose stools. 08/02/17  Yes Wardell Honour, MD  indomethacin (INDOCIN) 50 MG capsule Take 1 capsule (50 mg total) by mouth 3 (three) times daily as needed. Patient taking  differently: Take 50 mg by mouth 3 (three) times daily as needed (for pain.).  09/15/10  Yes Marletta Lor, MD  ipratropium (ATROVENT) 0.03 % nasal spray Place 1 spray into the nose daily as needed for rhinitis.  06/23/16  Yes [provider]  magnesium oxide (MAG-OX) 400 (241.3 Mg) MG tablet Take 400 mg by mouth daily.  07/19/17  Yes [provider]  morphine (MS CONTIN) 15 MG 12 hr tablet Take 15 mg by mouth every 12 (twelve) hours.   Yes [provider]  Multiple Vitamin (MULTIVITAMIN) tablet Take 1 tablet by mouth daily.   Yes [provider]  oxyCODONE (OXY IR/ROXICODONE) 5 MG immediate release tablet Take 1 tablet (5 mg total) by mouth every 4 (four) hours as needed for severe pain. In addition to current chronic pain medication Patient taking differently: Take 5 mg by mouth every 4 (four) hours.  06/15/15  Yes Loni Dolly, PA-C  furosemide (LASIX) 40 MG tablet Take 1 tablet (40 mg total) by mouth daily. Patient not taking: Reported on 10/11/2018 06/28/15   Marletta Lor, MD  neomycin-polymyxin-hydrocortisone (CORTISPORIN) OTIC solution Apply 1-2 drops to toe after soaking BID Patient not taking: Reported on 10/11/2018 07/11/18   Wallene Huh, DPM  triamcinolone cream (KENALOG) 0.1 % Apply 1 application topically 2 (two) times daily. Patient not taking: Reported on 10/11/2018 08/24/15   Marletta Lor, MD    Physical Exam: Constitutional: Moderately built and nourished. Vitals:   10/11/18 2145 10/11/18 2200 10/11/18 2215 10/12/18 0113  BP: (!) 141/85 127/82 (!) 147/93 (!) 151/82  Pulse: (!) 111  (!) 112 68  Resp: 12 (!) 24 17 11   Temp:      TempSrc:      SpO2: 97%  96% 96%  Weight:  93 kg    Height:  6\' 2"  (1.88 m)     Eyes: Anicteric no pallor. ENMT: No discharge from the ears eyes nose or mouth. Neck: JVD elevated no mass felt. Respiratory: No rhonchi no crepitations. Cardiovascular: S1-S2 heard. Abdomen: Soft nontender bowel  sounds present. Musculoskeletal: Bilateral lower extremity edema present. Skin: Small ulceration on anterior right shin. Neurologic: Alert awake oriented to time place and person.  Moves all extremities. Psychiatric: Appears normal per normal affect.   Labs on Admission: I have personally reviewed following labs and imaging studies  CBC: Recent Labs  Lab 10/11/18 2028  WBC 4.1  NEUTROABS 2.5  HGB 11.9*  HCT 34.3*  MCV 104.9*  PLT 751   Basic Metabolic Panel: Recent Labs  Lab 10/11/18 2028  NA 140  K 4.0  CL 100  CO2 30  GLUCOSE 122*  BUN 9  CREATININE  0.80  CALCIUM 9.1  MG 1.6*   GFR: Estimated Creatinine Clearance: 95.6 mL/min (by C-G formula based on SCr of 0.8 mg/dL). Liver Function Tests: Recent Labs  Lab 10/11/18 2038  AST 68*  ALT 35  ALKPHOS 113  BILITOT 0.7  PROT 7.0  ALBUMIN 3.3*   No results for input(s): LIPASE, AMYLASE in the last 168 hours. No results for input(s): AMMONIA in the last 168 hours. Coagulation Profile: No results for input(s): INR, PROTIME in the last 168 hours. Cardiac Enzymes: No results for input(s): CKTOTAL, CKMB, CKMBINDEX, TROPONINI in the last 168 hours. BNP (last 3 results) No results for input(s): PROBNP in the last 8760 hours. HbA1C: No results for input(s): HGBA1C in the last 72 hours. CBG: No results for input(s): GLUCAP in the last 168 hours. Lipid Profile: No results for input(s): CHOL, HDL, LDLCALC, TRIG, CHOLHDL, LDLDIRECT in the last 72 hours. Thyroid Function Tests: Recent Labs    10/11/18 2028  TSH 1.464   Anemia Panel: No results for input(s): VITAMINB12, FOLATE, FERRITIN, TIBC, IRON, RETICCTPCT in the last 72 hours. Urine analysis:    Component Value Date/Time   COLORURINE STRAW (A) 10/11/2018 2040   APPEARANCEUR CLEAR 10/11/2018 2040   LABSPEC 1.003 (L) 10/11/2018 2040   PHURINE 8.0 10/11/2018 2040   GLUCOSEU NEGATIVE 10/11/2018 2040   HGBUR NEGATIVE 10/11/2018 2040   HGBUR negative  12/28/2009 1039   BILIRUBINUR NEGATIVE 10/11/2018 2040   BILIRUBINUR Negative 06/03/2012 [place] 10/11/2018 2040   PROTEINUR NEGATIVE 10/11/2018 2040   UROBILINOGEN 0.2 06/03/2012 1157   UROBILINOGEN 0.2 12/28/2009 1039   NITRITE NEGATIVE 10/11/2018 2040   LEUKOCYTESUR NEGATIVE 10/11/2018 2040   Sepsis Labs: @LABRCNTIP (procalcitonin:4,lacticidven:4) ) Recent Results (from the past 240 hour(s))  SARS Coronavirus 2 (CEPHEID- Performed in [place] hospital lab), Hosp Order     Status: None   Collection Time: 10/11/18  8:38 PM   Specimen: Nasopharyngeal Swab  Result Value Ref Range Status   SARS Coronavirus 2 NEGATIVE NEGATIVE Final    Comment: (NOTE) If result is NEGATIVE SARS-CoV-2 target nucleic acids are NOT DETECTED. The SARS-CoV-2 RNA is generally detectable in upper and lower  respiratory specimens during the acute phase of infection. The lowest  concentration of SARS-CoV-2 viral copies this assay can detect is 250  copies / mL. A negative result does not preclude SARS-CoV-2 infection  and should not be used as the sole basis for treatment or other  patient management decisions.  A negative result may occur with  improper specimen collection / handling, submission of specimen other  than nasopharyngeal swab, presence of viral mutation(s) within the  areas targeted by this assay, and inadequate number of viral copies  (<250 copies / mL). A negative result must be combined with clinical  observations, patient history, and epidemiological information. If result is POSITIVE SARS-CoV-2 target nucleic acids are DETECTED. The SARS-CoV-2 RNA is generally detectable in upper and lower  respiratory specimens dur ing the acute phase of infection.  Positive  results are indicative of active infection with SARS-CoV-2.  Clinical  correlation with patient history and other diagnostic information is  necessary to determine patient infection status.  Positive results do   not rule out bacterial infection or co-infection with other viruses. If result is PRESUMPTIVE POSTIVE SARS-CoV-2 nucleic acids MAY BE PRESENT.   A presumptive positive result was obtained on the submitted specimen  and confirmed on repeat testing.  While 2019 novel coronavirus  (SARS-CoV-2) nucleic acids may  be present in the submitted sample  additional confirmatory testing may be necessary for epidemiological  and / or clinical management purposes  to differentiate between  SARS-CoV-2 and other Sarbecovirus currently known to infect humans.  If clinically indicated additional testing with an alternate test  methodology [phone]) is advised. The SARS-CoV-2 RNA is generally  detectable in upper and lower respiratory sp ecimens during the acute  phase of infection. The expected result is Negative. Fact Sheet for Patients:  StrictlyIdeas.no Fact Sheet for Healthcare Providers: BankingDealers.co.za This test is not yet approved or cleared by the [place] FDA and has been authorized for detection and/or diagnosis of SARS-CoV-2 by FDA under an Emergency Use Authorization (EUA).  This EUA will remain in effect (meaning this test can be used) for the duration of the COVID-19 declaration under Section 564(b)(1) of the Act, 21 U.S.C. section 360bbb-3(b)(1), unless the authorization is terminated or revoked sooner. Performed at [facility], [place] [address], [place], [place] [address]      Radiological Exams on Admission: Dg Chest Portable 1 View  Result Date: 10/11/2018 CLINICAL DATA:  Atrial fibrillation. Orthopnea. Shortness of breath. EXAM: PORTABLE CHEST 1 VIEW COMPARISON:  June 15, 2016 FINDINGS: The heart size and mediastinal contours are within normal limits. Both lungs are clear. The visualized skeletal structures are unremarkable. IMPRESSION: No active disease. Electronically Signed   By: Dorise Bullion III M.D   On:  10/11/2018 19:47    EKG: Independently reviewed.  A. fib with RVR.  Assessment/Plan Principal Problem:   Atrial fibrillation with RVR (HCC) Active Problems:   GOUT   Chronic pain   Macrocytic anemia    1. A. fib with RVR -onset not known.  Patient's chads 2 vas score is 0.  For now we will keep patient on heparin and check 2D echo consult cardiology.  Patient is on Cardizem infusion I have added Cardizem CD 120 mg.  We will try to wean off Cardizem. 2. Bilateral lower extremity edema could be from CHF -follow 2D echo.  May need Lasix.  Patient states he has been on Lasix many years ago which he took for few weeks and stopped.  Check Dopplers. 3. History of gout on allopurinol. 4. Macrocytic anemia could be from alcoholism but patient also takes B12 supplements.  Check anemia panel. 5. Drinks alcohol every day for which I have placed patient on CIWA protocol. 6. Small ulceration on the right anterior shin.  Will get wound team.   DVT prophylaxis: Lovenox. Code Status: Full code. Family Communication: Discussed with patient. Disposition Plan: Home. Consults called: Cardiology. Admission status: Up duration.   Rise Patience MD [facility] Pager [phone].  If 7PM-7AM, please contact night-coverage [email or web address] Password [facility]  10/12/2018, 1:37 AM

## 2018-10-12 NOTE — Progress Notes (Signed)
VASCULAR LAB PRELIMINARY  PRELIMINARY  PRELIMINARY  PRELIMINARY  Bilateral lower extremity venous duplex completed.    Preliminary report:  See CV proc for preliminary results.   Clever Geraldo, RVT 10/12/2018, 12:28 PM

## 2018-10-12 NOTE — ED Notes (Signed)
Report given to stephanie on 6e

## 2018-10-13 DIAGNOSIS — D539 Nutritional anemia, unspecified: Secondary | ICD-10-CM

## 2018-10-13 DIAGNOSIS — R600 Localized edema: Secondary | ICD-10-CM

## 2018-10-13 DIAGNOSIS — M1000 Idiopathic gout, unspecified site: Secondary | ICD-10-CM

## 2018-10-13 DIAGNOSIS — I4891 Unspecified atrial fibrillation: Principal | ICD-10-CM

## 2018-10-13 DIAGNOSIS — G894 Chronic pain syndrome: Secondary | ICD-10-CM

## 2018-10-13 LAB — CBC
HCT: 31.2 % — ABNORMAL LOW (ref 39.0–52.0)
Hemoglobin: 10.7 g/dL — ABNORMAL LOW (ref 13.0–17.0)
MCH: 36 pg — ABNORMAL HIGH (ref 26.0–34.0)
MCHC: 34.3 g/dL (ref 30.0–36.0)
MCV: 105.1 fL — ABNORMAL HIGH (ref 80.0–100.0)
Platelets: 134 10*3/uL — ABNORMAL LOW (ref 150–400)
RBC: 2.97 MIL/uL — ABNORMAL LOW (ref 4.22–5.81)
RDW: 12.2 % (ref 11.5–15.5)
WBC: 4.1 10*3/uL (ref 4.0–10.5)
nRBC: 0 % (ref 0.0–0.2)

## 2018-10-13 LAB — BASIC METABOLIC PANEL
Anion gap: 11 (ref 5–15)
BUN: 7 mg/dL — ABNORMAL LOW (ref 8–23)
CO2: 27 mmol/L (ref 22–32)
Calcium: 9 mg/dL (ref 8.9–10.3)
Chloride: 101 mmol/L (ref 98–111)
Creatinine, Ser: 0.81 mg/dL (ref 0.61–1.24)
GFR calc Af Amer: >60 mL/min (ref >60)
GFR calc non Af Amer: >60 mL/min (ref >60)
Glucose, Bld: 95 mg/dL (ref 70–99)
Potassium: 3.5 mmol/L (ref 3.5–5.1)
Sodium: 139 mmol/L (ref 135–145)

## 2018-10-13 LAB — HEPARIN LEVEL (UNFRACTIONATED)
Heparin Unfractionated: 0.27 IU/mL — ABNORMAL LOW (ref 0.30–0.70)
Heparin Unfractionated: 0.37 IU/mL (ref 0.30–0.70)
Heparin Unfractionated: 0.33 IU/mL (ref 0.30–0.70)

## 2018-10-13 MED ORDER — POTASSIUM CHLORIDE CRYS ER 20 MEQ PO TBCR
40.0000 meq | EXTENDED_RELEASE_TABLET | Freq: Once | ORAL | Status: AC
  Administered 2018-10-13: 40 meq via ORAL
  Filled 2018-10-13: qty 2

## 2018-10-13 MED ORDER — MAGNESIUM SULFATE 2 GM/50ML IV SOLN
2.0000 g | Freq: Once | INTRAVENOUS | Status: AC
  Administered 2018-10-13: 11:00:00 2 g via INTRAVENOUS
  Filled 2018-10-13: qty 50

## 2018-10-13 MED ORDER — POTASSIUM CHLORIDE CRYS ER 20 MEQ PO TBCR
20.0000 meq | EXTENDED_RELEASE_TABLET | Freq: Every day | ORAL | Status: DC
  Administered 2018-10-14: 20 meq via ORAL
  Filled 2018-10-13 (×2): qty 1

## 2018-10-13 MED ORDER — FUROSEMIDE 10 MG/ML IJ SOLN
20.0000 mg | Freq: Two times a day (BID) | INTRAMUSCULAR | Status: DC
  Administered 2018-10-13 – 2018-10-14 (×2): 20 mg via INTRAVENOUS
  Filled 2018-10-13 (×2): qty 2

## 2018-10-13 MED ORDER — METOPROLOL TARTRATE 25 MG PO TABS
25.0000 mg | ORAL_TABLET | Freq: Two times a day (BID) | ORAL | Status: DC
  Administered 2018-10-13 – 2018-10-14 (×3): 25 mg via ORAL
  Filled 2018-10-13 (×3): qty 1

## 2018-10-13 NOTE — Progress Notes (Signed)
Progress Note  Patient Name: [name] Date of Encounter: 10/13/2018  Primary Cardiologist: Dr. Quay Burow  Subjective   Asymptomatic this morning.  His heart rate is in the 1 40-1 50 range although he is unaware of this.  He is on IV heparin  Inpatient Medications    Scheduled Meds: . allopurinol  300 mg Oral Daily  . calcium carbonate  500 mg of elemental calcium Oral BID  . diltiazem  240 mg Oral Daily  . folic acid  1 mg Oral Daily  . furosemide  40 mg Oral Daily  . magnesium oxide  400 mg Oral Daily  . morphine  15 mg Oral Q12H  . multivitamin with minerals  1 tablet Oral Daily  . potassium chloride  20 mEq Oral Daily  . thiamine  100 mg Oral Daily   Or  . thiamine  100 mg Intravenous Daily  . cyanocobalamin  2,000 mcg Oral Daily   Continuous Infusions: . heparin 2,200 Units/hr (10/13/18 0046)   PRN Meds: acetaminophen **OR** acetaminophen, LORazepam **OR** LORazepam, metoprolol tartrate, ondansetron **OR** ondansetron (ZOFRAN) IV, oxyCODONE   Vital Signs    Vitals:   10/12/18 1735 10/12/18 2221 10/13/18 0500 10/13/18 0716  BP: 135/85 113/70 122/81 134/89  Pulse:  68    Resp:  18 18   Temp:  97.9 F (36.6 C) 98 F (36.7 C)   TempSrc:  Oral Oral   SpO2:  100% 98%   Weight:   92 kg   Height:        Intake/Output Summary (Last 24 hours) at 10/13/2018 1043 Last data filed at 10/13/2018 0900 Gross per 24 hour  Intake 828.69 ml  Output 2325 ml  Net -1496.31 ml   Last 3 Weights 10/13/2018 10/12/2018 10/11/2018  Weight (lbs) 202 lb 12.8 oz 206 lb 9.1 oz 205 lb  Weight (kg) 91.989 kg 93.7 kg 92.987 kg      Telemetry    Atrial flutter with 2 1 block and a ventricular spots of 1 40-1 50- Personally Reviewed  ECG    Not performed today- Personally Reviewed  Physical Exam   GEN: No acute distress.   Neck: No JVD Cardiac: RRR, no murmurs, rubs, or gallops.  Respiratory: Clear to auscultation bilaterally. GI: Soft, nontender, non-distended   MS: No edema; No deformity. Neuro:  Nonfocal  Psych: Normal affect   Labs    High Sensitivity Troponin:   Recent Labs  Lab 10/11/18 2028 10/12/18 0445 10/12/18 0612  TROPONINIHS 11 14 14       Cardiac EnzymesNo results for input(s): TROPONINI in the last 168 hours. No results for input(s): TROPIPOC in the last 168 hours.   Chemistry Recent Labs  Lab 10/11/18 2028 10/11/18 2038 10/12/18 0445 10/13/18 0413  NA 140  --  140 139  K 4.0  --  3.7 3.5  CL 100  --  102 101  CO2 30  --  24 27  GLUCOSE 122*  --  108* 95  BUN 9  --  6* 7*  CREATININE 0.80  --  0.75 0.81  CALCIUM 9.1  --  8.9 9.0  PROT  --  7.0 6.7  --   ALBUMIN  --  3.3* 3.4*  --   AST  --  68* 59*  --   ALT  --  35 32  --   ALKPHOS  --  113 113  --   BILITOT  --  0.7 1.2  --  GFRNONAA >60  --  >60 >60  GFRAA >60  --  >60 >60  ANIONGAP 10  --  14 11     Hematology Recent Labs  Lab 10/11/18 2028 10/12/18 0445 10/12/18 0612 10/13/18 0413  WBC 4.1 5.8  --  4.1  RBC 3.27* 3.26* 3.07* 2.97*  HGB 11.9* 11.8*  --  10.7*  HCT 34.3* 34.4*  --  31.2*  MCV 104.9* 105.5*  --  105.1*  MCH 36.4* 36.2*  --  36.0*  MCHC 34.7 34.3  --  34.3  RDW 12.0 12.0  --  12.2  PLT 161 170  --  134*    BNP Recent Labs  Lab 10/11/18 2028  BNP 234.9*     DDimer  Recent Labs  Lab 10/11/18 2038  DDIMER 0.57*     Radiology    Dg Chest Portable 1 View  Result Date: 10/11/2018 CLINICAL DATA:  Atrial fibrillation. Orthopnea. Shortness of breath. EXAM: PORTABLE CHEST 1 VIEW COMPARISON:  June 15, 2016 FINDINGS: The heart size and mediastinal contours are within normal limits. Both lungs are clear. The visualized skeletal structures are unremarkable. IMPRESSION: No active disease. Electronically Signed   By: Dorise Bullion III M.D   On: 10/11/2018 19:47   Vas [place] Lower Extremity Venous (dvt)  Result Date: 10/12/2018  Lower Venous Study Indications: Swelling.  Risk Factors: A-Fib, CHF. Limitations: Patient too  ticklish to compress in groin. Comparison Study: No prior study on file for comparison Performing Technologist: Sharion Dove RVS  Examination Guidelines: A complete evaluation includes B-mode imaging, spectral Doppler, color Doppler, and power Doppler as needed of all accessible portions of each vessel. Bilateral testing is considered an integral part of a complete examination. Limited examinations for reoccurring indications may be performed as noted.  +---------+---------------+---------+-----------+----------+-------------------+ RIGHT    CompressibilityPhasicitySpontaneityPropertiesSummary             +---------+---------------+---------+-----------+----------+-------------------+ CFV                     Yes      Yes                  patent by color and                                                       Doppler             +---------+---------------+---------+-----------+----------+-------------------+ FV Prox  Full                                                             +---------+---------------+---------+-----------+----------+-------------------+ FV Mid   Full                                                             +---------+---------------+---------+-----------+----------+-------------------+ FV DistalFull                                                             +---------+---------------+---------+-----------+----------+-------------------+  PFV      Full                                                             +---------+---------------+---------+-----------+----------+-------------------+ POP      Full           Yes      Yes                                      +---------+---------------+---------+-----------+----------+-------------------+ PTV      Full                                                             +---------+---------------+---------+-----------+----------+-------------------+ PERO     Full                                                              +---------+---------------+---------+-----------+----------+-------------------+   +---------+---------------+---------+-----------+----------+-------------------+ LEFT     CompressibilityPhasicitySpontaneityPropertiesSummary             +---------+---------------+---------+-----------+----------+-------------------+ CFV                     Yes      Yes                  patent by color and                                                       Doppler             +---------+---------------+---------+-----------+----------+-------------------+ SFJ                     Yes      Yes                                      +---------+---------------+---------+-----------+----------+-------------------+ FV Prox  Full                                                             +---------+---------------+---------+-----------+----------+-------------------+ FV Mid   Full                                                             +---------+---------------+---------+-----------+----------+-------------------+  FV DistalFull                                                             +---------+---------------+---------+-----------+----------+-------------------+ PFV      Full                                                             +---------+---------------+---------+-----------+----------+-------------------+ POP      Full           Yes      Yes                                      +---------+---------------+---------+-----------+----------+-------------------+ PTV      Full                                                             +---------+---------------+---------+-----------+----------+-------------------+ PERO     Full                                                             +---------+---------------+---------+-----------+----------+-------------------+     Summary: Right: There is no  evidence of deep vein thrombosis in the lower extremity. Left: There is no evidence of deep vein thrombosis in the lower extremity.  *See table(s) above for measurements and observations.    Preliminary     Cardiac Studies   2D echocardiogram (10/12/2018)  IMPRESSIONS    1. The left ventricle has normal systolic function, with an ejection fraction of 55-60%. The cavity size was normal. There is mildly increased left ventricular wall thickness. Left ventricular diastolic Doppler parameters are consistent with impaired  relaxation.  2. The right ventricle has normal systolic function. The cavity was normal. There is no increase in right ventricular wall thickness. Right ventricular systolic pressure is mildly elevated with an estimated pressure of 36.0 mmHg.  3. Left atrial size was mildly dilated.  4. There is mild mitral annular calcification present.  5. The aortic valve is tricuspid. Mild thickening of the aortic valve. Mild calcification of the aortic valve. Aortic valve regurgitation is trivial by color flow Doppler.  6. The inferior vena cava was dilated in size with <50% respiratory variability.   Patient Profile     [name] is a 72 y.o. male with a hx of gout, alcoholism who we saw on 10/12/2018 for the evaluation of atrial fibrillation at the request of Dr. Hal Hope.  Assessment & Plan    1: Atrial fibrillation/atrial flutter with RVR- newly recognized A. fib with controlled ventricular response yesterday.  He was placed on IV heparin when he was admitted to the hospital.  His rate seems  to have increased into the 140 range/a flutter with 2-1 conduction.  He is on p.o. diltiazem for rate control.  2D echo revealed normal LV function with mild left atrial enlargement.  Plan TEE cardioversion on Monday  2: Congestive heart failure- with normal LV function he has heart failure with preserved EF.  He did diurese 900 cc of fluid.  Serum potassium is 3.5 and magnesium is 1.5.   We will replete.     For questions or updates, please contact [place] Please consult [email or web address] for contact info under        Signed, Quay Burow, MD  10/13/2018, 10:43 AM

## 2018-10-13 NOTE — Plan of Care (Signed)
  Problem: Coping: Goal: Level of anxiety will decrease Outcome: Progressing   Problem: Pain Managment: Goal: General experience of comfort will improve Outcome: Progressing   Problem: Safety: Goal: Ability to remain free from injury will improve Outcome: Progressing   

## 2018-10-13 NOTE — Progress Notes (Signed)
ANTICOAGULATION CONSULT NOTE - Follow Up Consult  Pharmacy Consult for heparin Indication: atrial fibrillation  Labs: Recent Labs    10/11/18 2028 10/12/18 0445 10/12/18 0542 10/12/18 0612 10/12/18 1256 10/12/18 2323  HGB 11.9* 11.8*  --   --   --   --   HCT 34.3* 34.4*  --   --   --   --   PLT 161 170  --   --   --   --   HEPARINUNFRC  --   --  0.21*  --  0.25* 0.27*  CREATININE 0.80 0.75  --   --   --   --   TROPONINIHS 11 14  --  14  --   --     Assessment: 73yo male remains subtherapeutic on heparin after rate changes though approaching goal; no gtt issues or signs of bleeding per RN.  Goal of Therapy:  Heparin level 0.3-0.7 units/ml   Plan:  Will increase heparin gtt by 1-2 units/kg/hr to 2200 units/hr and check level in 6 hours.    Wynona Neat, PharmD, BCPS  10/13/2018,12:23 AM

## 2018-10-13 NOTE — Progress Notes (Signed)
PROGRESS NOTE    [name]  HLK:[id number]  DOB: 09-Aug-1945  DOA: 10/11/2018 PCP: Patient, No Pcp Per  Brief Narrative:  73 year old male with history of chronic alcohol abuse, chronic pain on chronic opiates at baseline (morphine and oxycodone), gout, chronic venous insufficiency who has been recently experiencing worsening leg swellings and dyspnea and who went to see a podiatrist yesterday for routine diabetic care and with c/o ulceration on anterior shin (which he believes is from a insect bite)  was referred to the ED for evaluation of leg swellings and to rule out DVT.  Patient in A. fib with RVR on presentation to the ED.  Initiated on Cardizem drip, now transition to oral Cardizem.  He reports drinking 3 cans of beer and wine occasionally with dinner.  Denies any prior history of alcohol withdrawal or intubations or seizures.  He has been evaluated by cardiology for new onset A. fib/flutter. Doppler lower extremity negative for DVT.  Patient on heparin drip and cardiology evaluating stroke risk and need for long-term anticoagulation. Patient states his leg swellings have progressively gotten worse over last few months. He remembers being on lasix in the past for the same.   Subjective: Telemetry currently showing a flutter with heart rate in 140s although he was in 60s overnight. Patient reports noting tachycardia on monitor while eating breakfast this morning.  P.o. Cardizem dose was increased to 240 mg by cardiology yesterday and patient did receive prn metoprolol IV dose for tachycardia yesterday evening.   Objective: Vitals:   10/12/18 1735 10/12/18 2221 10/13/18 0500 10/13/18 0716  BP: 135/85 113/70 122/81 134/89  Pulse:  68    Resp:  18 18   Temp:  97.9 F (36.6 C) 98 F (36.7 C)   TempSrc:  Oral Oral   SpO2:  100% 98%   Weight:   92 kg   Height:        Intake/Output Summary (Last 24 hours) at 10/13/2018 1237 Last data filed at 10/13/2018 0900 Gross per 24 hour   Intake 648.69 ml  Output 2325 ml  Net -1676.31 ml   Filed Weights   10/11/18 2200 10/12/18 0228 10/13/18 0500  Weight: 93 kg 93.7 kg 92 kg    Physical Examination:  General exam: Appears calm and comfortable  Respiratory system: Clear to auscultation. Respiratory effort normal. Cardiovascular system: S1 & S2 heard, tachycardic. No JVD, murmurs, rubs, gallops or clicks. 2+pitting leg/ pedal edema. Gastrointestinal system: Abdomen is nondistended, soft and nontender. No organomegaly or masses felt. Normal bowel sounds heard. Central nervous system: Alert and oriented. No focal neurological deficits. Extremities: Symmetric 5 x 5 power. Small wound with Tegaderm on right anterior shin.  Skin: No rashes, lesions or ulcers Psychiatry: Judgement and insight appear normal. Mood & affect appropriate.     Data Reviewed: I have personally reviewed following labs and imaging studies  CBC: Recent Labs  Lab 10/11/18 2028 10/12/18 0445 10/13/18 0413  WBC 4.1 5.8 4.1  NEUTROABS 2.5 3.4  --   HGB 11.9* 11.8* 10.7*  HCT 34.3* 34.4* 31.2*  MCV 104.9* 105.5* 105.1*  PLT 161 170 734*   Basic Metabolic Panel: Recent Labs  Lab 10/11/18 2028 10/12/18 0445 10/13/18 0413  NA 140 140 139  K 4.0 3.7 3.5  CL 100 102 101  CO2 30 24 27   GLUCOSE 122* 108* 95  BUN 9 6* 7*  CREATININE 0.80 0.75 0.81  CALCIUM 9.1 8.9 9.0  MG 1.6*  --   --  GFR: Estimated Creatinine Clearance: 91.8 mL/min (by C-G formula based on SCr of 0.81 mg/dL). Liver Function Tests: Recent Labs  Lab 10/11/18 2038 10/12/18 0445  AST 68* 59*  ALT 35 32  ALKPHOS 113 113  BILITOT 0.7 1.2  PROT 7.0 6.7  ALBUMIN 3.3* 3.4*   No results for input(s): LIPASE, AMYLASE in the last 168 hours. No results for input(s): AMMONIA in the last 168 hours. Coagulation Profile: No results for input(s): INR, PROTIME in the last 168 hours. Cardiac Enzymes: No results for input(s): CKTOTAL, CKMB, CKMBINDEX, TROPONINI in the last  168 hours. BNP (last 3 results) No results for input(s): PROBNP in the last 8760 hours. HbA1C: No results for input(s): HGBA1C in the last 72 hours. CBG: No results for input(s): GLUCAP in the last 168 hours. Lipid Profile: No results for input(s): CHOL, HDL, LDLCALC, TRIG, CHOLHDL, LDLDIRECT in the last 72 hours. Thyroid Function Tests: Recent Labs    10/12/18 0445  TSH 2.524   Anemia Panel: Recent Labs    10/12/18 0612  VITAMINB12 1,600*  FOLATE 27.6  FERRITIN 233  TIBC 263  IRON 77  RETICCTPCT 2.0   Sepsis Labs: No results for input(s): PROCALCITON, LATICACIDVEN in the last 168 hours.  Recent Results (from the past 240 hour(s))  SARS Coronavirus 2 (CEPHEID- Performed in [facility] hospital lab), Hosp Order     Status: None   Collection Time: 10/11/18  8:38 PM   Specimen: Nasopharyngeal Swab  Result Value Ref Range Status   SARS Coronavirus 2 NEGATIVE NEGATIVE Final    Comment: (NOTE) If result is NEGATIVE SARS-CoV-2 target nucleic acids are NOT DETECTED. The SARS-CoV-2 RNA is generally detectable in upper and lower  respiratory specimens during the acute phase of infection. The lowest  concentration of SARS-CoV-2 viral copies this assay can detect is 250  copies / mL. A negative result does not preclude SARS-CoV-2 infection  and should not be used as the sole basis for treatment or other  patient management decisions.  A negative result may occur with  improper specimen collection / handling, submission of specimen other  than nasopharyngeal swab, presence of viral mutation(s) within the  areas targeted by this assay, and inadequate number of viral copies  (<250 copies / mL). A negative result must be combined with clinical  observations, patient history, and epidemiological information. If result is POSITIVE SARS-CoV-2 target nucleic acids are DETECTED. The SARS-CoV-2 RNA is generally detectable in upper and lower  respiratory specimens dur ing the acute  phase of infection.  Positive  results are indicative of active infection with SARS-CoV-2.  Clinical  correlation with patient history and other diagnostic information is  necessary to determine patient infection status.  Positive results do  not rule out bacterial infection or co-infection with other viruses. If result is PRESUMPTIVE POSTIVE SARS-CoV-2 nucleic acids MAY BE PRESENT.   A presumptive positive result was obtained on the submitted specimen  and confirmed on repeat testing.  While 2019 novel coronavirus  (SARS-CoV-2) nucleic acids may be present in the submitted sample  additional confirmatory testing may be necessary for epidemiological  and / or clinical management purposes  to differentiate between  SARS-CoV-2 and other Sarbecovirus currently known to infect humans.  If clinically indicated additional testing with an alternate test  methodology [phone]) is advised. The SARS-CoV-2 RNA is generally  detectable in upper and lower respiratory sp ecimens during the acute  phase of infection. The expected result is Negative. Fact Sheet for Patients:  StrictlyIdeas.no Fact Sheet for Healthcare Providers: BankingDealers.co.za This test is not yet approved or cleared by the [place] FDA and has been authorized for detection and/or diagnosis of SARS-CoV-2 by FDA under an Emergency Use Authorization (EUA).  This EUA will remain in effect (meaning this test can be used) for the duration of the COVID-19 declaration under Section 564(b)(1) of the Act, 21 U.S.C. section 360bbb-3(b)(1), unless the authorization is terminated or revoked sooner. Performed at [facility], [place] [address], [place], [place] [address]       Radiology Studies: Dg Chest Portable 1 View  Result Date: 10/11/2018 CLINICAL DATA:  Atrial fibrillation. Orthopnea. Shortness of breath. EXAM: PORTABLE CHEST 1 VIEW COMPARISON:  June 15, 2016 FINDINGS: The  heart size and mediastinal contours are within normal limits. Both lungs are clear. The visualized skeletal structures are unremarkable. IMPRESSION: No active disease. Electronically Signed   By: Dorise Bullion III M.D   On: 10/11/2018 19:47   Vas [place] Lower Extremity Venous (dvt)  Result Date: 10/12/2018  Lower Venous Study Indications: Swelling.  Risk Factors: A-Fib, CHF. Limitations: Patient too ticklish to compress in groin. Comparison Study: No prior study on file for comparison Performing Technologist: Sharion Dove RVS  Examination Guidelines: A complete evaluation includes B-mode imaging, spectral Doppler, color Doppler, and power Doppler as needed of all accessible portions of each vessel. Bilateral testing is considered an integral part of a complete examination. Limited examinations for reoccurring indications may be performed as noted.  +---------+---------------+---------+-----------+----------+-------------------+  RIGHT     Compressibility Phasicity Spontaneity Properties Summary              +---------+---------------+---------+-----------+----------+-------------------+  CFV                       Yes       Yes                    patent by color and                                                              Doppler              +---------+---------------+---------+-----------+----------+-------------------+  FV Prox   Full                                                                  +---------+---------------+---------+-----------+----------+-------------------+  FV Mid    Full                                                                  +---------+---------------+---------+-----------+----------+-------------------+  FV Distal Full                                                                  +---------+---------------+---------+-----------+----------+-------------------+  PFV       Full                                                                   +---------+---------------+---------+-----------+----------+-------------------+  POP       Full            Yes       Yes                                         +---------+---------------+---------+-----------+----------+-------------------+  PTV       Full                                                                  +---------+---------------+---------+-----------+----------+-------------------+  PERO      Full                                                                  +---------+---------------+---------+-----------+----------+-------------------+   +---------+---------------+---------+-----------+----------+-------------------+  LEFT      Compressibility Phasicity Spontaneity Properties Summary              +---------+---------------+---------+-----------+----------+-------------------+  CFV                       Yes       Yes                    patent by color and                                                              Doppler              +---------+---------------+---------+-----------+----------+-------------------+  SFJ                       Yes       Yes                                         +---------+---------------+---------+-----------+----------+-------------------+  FV Prox   Full                                                                  +---------+---------------+---------+-----------+----------+-------------------+  FV  Mid    Full                                                                  +---------+---------------+---------+-----------+----------+-------------------+  FV Distal Full                                                                  +---------+---------------+---------+-----------+----------+-------------------+  PFV       Full                                                                  +---------+---------------+---------+-----------+----------+-------------------+  POP       Full            Yes       Yes                                          +---------+---------------+---------+-----------+----------+-------------------+  PTV       Full                                                                  +---------+---------------+---------+-----------+----------+-------------------+  PERO      Full                                                                  +---------+---------------+---------+-----------+----------+-------------------+     Summary: Right: There is no evidence of deep vein thrombosis in the lower extremity. Left: There is no evidence of deep vein thrombosis in the lower extremity.  *See table(s) above for measurements and observations.    Preliminary         Scheduled Meds:  allopurinol  300 mg Oral Daily   calcium carbonate  500 mg of elemental calcium Oral BID   diltiazem  240 mg Oral Daily   folic acid  1 mg Oral Daily   furosemide  40 mg Oral Daily   magnesium oxide  400 mg Oral Daily   morphine  15 mg Oral Q12H   multivitamin with minerals  1 tablet Oral Daily   potassium chloride  20 mEq Oral Daily   thiamine  100 mg Oral Daily   Or   thiamine  100 mg Intravenous Daily   cyanocobalamin  2,000 mcg Oral Daily   Continuous Infusions:  heparin 2,200 Units/hr (10/13/18 0046)    Assessment & Plan:    1.  A. fib/flutter with RVR: Patient evaluated by cardiology.  Off Cardizem drip since yesterday morning.  Heart rate controlled with increased dose of Cardizem yesterday but tachycardic again today.  On heparin drip and plan for TEE/cardioversion in a.m.  Chads vas score is 1-2.  Echo shows preserved EF with diastolic dysfunction.  Will add low-dose metoprolol twice daily.  Watch for bradycardia.  Replace potassium/magnesium.  TSH within normal limits  2.  Bilateral leg edema: Secondary to diastolic CHF/problem #1 versus venous stasis.  He reports prior history of leg edema which had resolved with transient use of diuretics.  Has had worsening leg edema over the last couple of months.  Will  change Lasix to IV while here.  Doppler of bilateral lower extremities negative for DVT.  Small puncture wound on the anterior shin appears to be healing and patient believes it developed secondary to insect bite/scratching.  3.  Chronic anemia: Stable.  On B12 supplements.  Monitor for now  4.  Chronic back pain: Patient on MS Contin/oxycodone IR at home which has been resumed here.  5.  Alcohol abuse: Drinks 4 cans of beer per day and wine sometimes with dinner.  Denies prior history of alcohol withdrawals.  Does not have other clinical stigmata of alcohol withdrawal.  Continue CIWA for now.  Advised risk of worsening problem #1 with ongoing alcohol abuse.  He is optimistic that he can quit  6.  History of gout: On allopurinol  DVT prophylaxis: On heparin drip Code Status: Full code Family / Patient Communication: Discussed with patient and all questions answered Disposition Plan: Home when medically cleared by cardiology.     LOS: 1 day    Time spent:     Guilford Shi, MD [facility] Pager [phone]  If 7PM-7AM, please contact night-coverage [email or web address] Password [email or web address] 10/13/2018, 12:37 PM

## 2018-10-13 NOTE — Progress Notes (Signed)
ANTICOAGULATION CONSULT NOTE - Follow Up Consult  Pharmacy Consult for heparin Indication: r/o VTE; afib  Allergies  Allergen Reactions  . No Known Allergies     Patient Measurements: Height: 6\' 1"  (185.4 cm) Weight: 202 lb 12.8 oz (92 kg) IBW/kg (Calculated) : 79.9 Heparin Dosing Weight: 92 kg  Vital Signs: Temp: 97.5 F (36.4 C) (07/12 1344) Temp Source: Oral (07/12 1344) BP: 106/69 (07/12 1344) Pulse Rate: 70 (07/12 1344)  Labs: Recent Labs    10/11/18 2028 10/12/18 0445  10/12/18 0612  10/12/18 2323 10/13/18 0413 10/13/18 0550 10/13/18 1551  HGB 11.9* 11.8*  --   --   --   --  10.7*  --   --   HCT 34.3* 34.4*  --   --   --   --  31.2*  --   --   PLT 161 170  --   --   --   --  134*  --   --   HEPARINUNFRC  --   --    < >  --    < > 0.27*  --  0.37 0.33  CREATININE 0.80 0.75  --   --   --   --  0.81  --   --   TROPONINIHS 11 14  --  14  --   --   --   --   --    < > = values in this interval not displayed.    Estimated Creatinine Clearance: 91.8 mL/min (by C-G formula based on SCr of 0.81 mg/dL).  Assessment: 73yo male was sent to ED by podiatry for evaluation of possible DVT, pt c/o BLE swelling x61mo, D-dimer found to be elevated, and patient was in Afib RVR. Preliminary results from venous duplex on 7/11 negative for DVT.   Heparin level therapeutic at 0.33 (goal 0.3-0.7) on drip rate of 2200 units/hr. Hgb trended down from 11.8 to 10.8, and pltc decreased from 170 to 134 (baseline pltc appears to be ~150s). No issues with heparin infusion or s/sx of bleeding noted.  Goal of Therapy:  Heparin level 0.3-0.7 units/ml Monitor platelets by anticoagulation protocol: Yes   Plan:  Continue heparin infusion at 2200 units/hr Check daily heparin level and CBC Monitor for s/sx of bleeding  Richardine Service, PharmD PGY1 Pharmacy Resident Direct Line: [phone] 10/13/2018 4:28 PM

## 2018-10-13 NOTE — Progress Notes (Signed)
Patient in aflutter, confirmed with EKG

## 2018-10-13 NOTE — Progress Notes (Addendum)
ANTICOAGULATION CONSULT NOTE - Follow Up Consult  Pharmacy Consult for heparin Indication: r/o VTE; afib  Allergies  Allergen Reactions  . No Known Allergies     Patient Measurements: Height: 6\' 1"  (185.4 cm) Weight: 202 lb 12.8 oz (92 kg) IBW/kg (Calculated) : 79.9 Heparin Dosing Weight: 92 kg  Vital Signs: Temp: 98 F (36.7 C) (07/12 0500) Temp Source: Oral (07/12 0500) BP: 134/89 (07/12 0716) Pulse Rate: 68 (07/11 2221)  Labs: Recent Labs    10/11/18 2028 10/12/18 0445  10/12/18 0612 10/12/18 1256 10/12/18 2323 10/13/18 0413 10/13/18 0550  HGB 11.9* 11.8*  --   --   --   --  10.7*  --   HCT 34.3* 34.4*  --   --   --   --  31.2*  --   PLT 161 170  --   --   --   --  134*  --   HEPARINUNFRC  --   --    < >  --  0.25* 0.27*  --  0.37  CREATININE 0.80 0.75  --   --   --   --  0.81  --   TROPONINIHS 11 14  --  14  --   --   --   --    < > = values in this interval not displayed.    Estimated Creatinine Clearance: 91.8 mL/min (by C-G formula based on SCr of 0.81 mg/dL).  Assessment: 73yo male was sent to ED by podiatry for evaluation of possible DVT, pt c/o BLE swelling x23mo, D-dimer found to be elevated, and patient was in Afib RVR. Preliminary results from venous duplex on 7/11 negative for DVT.   Heparin level therapeutic this AM at 0.37 (goal 0.3-0.7). Hgb trended down from 11.8 to 10.8, and pltc decreased from 170 to 134 (baseline pltc appears to be ~150s). No issues with heparin infusion or s/sx of bleeding per RN this AM.   Goal of Therapy:  Heparin level 0.3-0.7 units/ml Monitor platelets by anticoagulation protocol: Yes   Plan:  Continue heparin infusion at 2200 units/hr Check confirmatory heparin level in 6 hours Check daily heparin level and CBC Monitor for s/sx of bleeding  Thank you for allowing pharmacy to be a part of this patient's care.  Leron Croak, PharmD PGY2 Oncology/Hematology Pharmacy Resident 10/13/2018 9:46 AM

## 2018-10-14 ENCOUNTER — Inpatient Hospital Stay (HOSPITAL_COMMUNITY): Payer: Medicare Other | Admitting: Anesthesiology

## 2018-10-14 ENCOUNTER — Encounter (HOSPITAL_COMMUNITY)
Admission: EM | Disposition: A | Discharge status: Home / Self Care | Payer: Self-pay | Source: Home / Self Care | Attending: Internal Medicine

## 2018-10-14 ENCOUNTER — Encounter (HOSPITAL_COMMUNITY): Payer: Self-pay | Admitting: *Deleted

## 2018-10-14 DIAGNOSIS — I5031 Acute diastolic (congestive) heart failure: Secondary | ICD-10-CM

## 2018-10-14 LAB — BASIC METABOLIC PANEL
Anion gap: 9 (ref 5–15)
BUN: 11 mg/dL (ref 8–23)
CO2: 27 mmol/L (ref 22–32)
Calcium: 9 mg/dL (ref 8.9–10.3)
Chloride: 100 mmol/L (ref 98–111)
Creatinine, Ser: 0.91 mg/dL (ref 0.61–1.24)
GFR calc Af Amer: >60 mL/min (ref >60)
GFR calc non Af Amer: >60 mL/min (ref >60)
Glucose, Bld: 100 mg/dL — ABNORMAL HIGH (ref 70–99)
Potassium: 4.1 mmol/L (ref 3.5–5.1)
Sodium: 136 mmol/L (ref 135–145)

## 2018-10-14 LAB — CBC
HCT: 34.5 % — ABNORMAL LOW (ref 39.0–52.0)
Hemoglobin: 11.7 g/dL — ABNORMAL LOW (ref 13.0–17.0)
MCH: 36 pg — ABNORMAL HIGH (ref 26.0–34.0)
MCHC: 33.9 g/dL (ref 30.0–36.0)
MCV: 106.2 fL — ABNORMAL HIGH (ref 80.0–100.0)
Platelets: 163 10*3/uL (ref 150–400)
RBC: 3.25 MIL/uL — ABNORMAL LOW (ref 4.22–5.81)
RDW: 12.2 % (ref 11.5–15.5)
WBC: 5 10*3/uL (ref 4.0–10.5)
nRBC: 0 % (ref 0.0–0.2)

## 2018-10-14 LAB — MAGNESIUM: Magnesium: 1.6 mg/dL — ABNORMAL LOW (ref 1.7–2.4)

## 2018-10-14 LAB — HEPARIN LEVEL (UNFRACTIONATED): Heparin Unfractionated: 0.64 IU/mL (ref 0.30–0.70)

## 2018-10-14 SURGERY — CANCELLED PROCEDURE

## 2018-10-14 MED ORDER — SODIUM CHLORIDE 0.9 % IV SOLN: INTRAVENOUS | Status: DC

## 2018-10-14 MED ORDER — LACTATED RINGERS IV SOLN: INTRAVENOUS | Status: DC | PRN

## 2018-10-14 MED ORDER — POLYETHYLENE GLYCOL 3350 17 G PO PACK: 17.0000 g | PACK | Freq: Every day | ORAL | Status: DC | PRN

## 2018-10-14 MED ORDER — FOLIC ACID 1 MG PO TABS: 1.0000 mg | ORAL_TABLET | Freq: Every day | ORAL | 1 refills | Status: DC

## 2018-10-14 MED ORDER — THIAMINE HCL 100 MG PO TABS: 100.0000 mg | ORAL_TABLET | Freq: Every day | ORAL | 1 refills | Status: DC

## 2018-10-14 MED ORDER — FUROSEMIDE 40 MG PO TABS: 40.0000 mg | ORAL_TABLET | Freq: Every day | ORAL | 3 refills | Status: DC

## 2018-10-14 MED ORDER — SODIUM CHLORIDE 0.9% FLUSH
3.0000 mL | Freq: Two times a day (BID) | INTRAVENOUS | Status: DC
  Administered 2018-10-14: 11:00:00 3 mL via INTRAVENOUS

## 2018-10-14 MED ORDER — SODIUM CHLORIDE 0.9% FLUSH: 3.0000 mL | INTRAVENOUS | Status: DC | PRN

## 2018-10-14 MED ORDER — DILTIAZEM HCL ER COATED BEADS 240 MG PO CP24: 240.0000 mg | ORAL_CAPSULE | Freq: Every day | ORAL | 2 refills | Status: DC

## 2018-10-14 MED ORDER — APIXABAN 5 MG PO TABS: 5.0000 mg | ORAL_TABLET | Freq: Two times a day (BID) | ORAL | 1 refills | Status: DC

## 2018-10-14 MED ORDER — METOPROLOL TARTRATE 25 MG PO TABS: 25.0000 mg | ORAL_TABLET | Freq: Two times a day (BID) | ORAL | 2 refills | Status: DC

## 2018-10-14 MED ORDER — SODIUM CHLORIDE 0.9 % IV SOLN: INTRAVENOUS | Status: DC | PRN

## 2018-10-14 MED ORDER — DOCUSATE SODIUM 100 MG PO CAPS
100.0000 mg | ORAL_CAPSULE | Freq: Two times a day (BID) | ORAL | Status: DC
  Administered 2018-10-14: 100 mg via ORAL
  Filled 2018-10-14: qty 1

## 2018-10-14 MED ORDER — DOCUSATE SODIUM 100 MG PO CAPS: 100.0000 mg | ORAL_CAPSULE | Freq: Two times a day (BID) | ORAL | 0 refills | Status: DC

## 2018-10-14 MED ORDER — APIXABAN 5 MG PO TABS
5.0000 mg | ORAL_TABLET | Freq: Two times a day (BID) | ORAL | Status: DC
  Administered 2018-10-14: 17:00:00 5 mg via ORAL
  Filled 2018-10-14: qty 1

## 2018-10-14 MED ORDER — SODIUM CHLORIDE 0.9 % IV SOLN: 250.0000 mL | INTRAVENOUS | Status: DC

## 2018-10-14 MED ORDER — POTASSIUM CHLORIDE CRYS ER 10 MEQ PO TBCR: 10.0000 meq | EXTENDED_RELEASE_TABLET | Freq: Every day | ORAL | 1 refills | Status: DC

## 2018-10-14 MED FILL — POTASSIUM CHL ER M10 TABLET: 10 | 30 days supply | Qty: 30 | Fill #0

## 2018-10-14 MED FILL — FUROSEMIDE 40 MG TABLET: 40 | 30 days supply | Qty: 30 | Fill #0

## 2018-10-14 MED FILL — DOK 100 MG CAPS: 100 | 5 days supply | Qty: 10 | Fill #0

## 2018-10-14 MED FILL — THIAMINE HCL 100 MG TABS: 100 | 30 days supply | Qty: 30 | Fill #0

## 2018-10-14 MED FILL — FOLIC ACID 1 MG TABS: 1 | 30 days supply | Qty: 30 | Fill #0

## 2018-10-14 MED FILL — CARTIA XT 240 MG CAPSULE: 240 | 30 days supply | Qty: 30 | Fill #0

## 2018-10-14 MED FILL — ELIQUIS 5 MG TABLET: 5 | 30 days supply | Qty: 60 | Fill #0

## 2018-10-14 MED FILL — METOPROLOL TARTRATE 25 MG T: 25 | 30 days supply | Qty: 60 | Fill #0

## 2018-10-14 NOTE — Discharge Summary (Addendum)
Physician Discharge Summary  [name] VPX:[id number] DOB: 1946-02-27 DOA: 10/11/2018  PCP: Patient, No Pcp Per  Admit date: 10/11/2018 Discharge date: 10/14/2018 Consultations: Cardiology Admitted From: Home Disposition: Home  Discharge Diagnoses:  Principal Problem:   Atrial fibrillation with RVR ([place])  Acute diastolic CHF in the setting of RVR Active Problems:   GOUT   Chronic pain   Macrocytic anemia   Atrial fibrillation (HCC) Chronic Leg edema secondary to venous stasis    Hospital Course Summary:  73 year old male with history of chronic alcohol abuse, chronic pain on chronic opiates at baseline (morphine and oxycodone), gout, chronic venous insufficiency who has been recently experiencing worsening leg swellings and dyspnea and who went to see a podiatrist yesterday for routine diabetic care and with c/o ulceration on anterior shin (which he believes is from a insect bite)  was referred to the ED for evaluation of leg swellings and to rule out DVT. Patient in A. fib with RVR on presentation to the ED. Initiated on Cardizem drip, now transition to oral Cardizem.Patient states his leg swellings have progressively gotten worse over last few months. He remembers being on lasix in the past for the same.  He reports drinking 3 cans of beer and wine occasionally with dinner. Denies any prior history of alcohol withdrawal or intubations or seizures.  Patient seen and evaluated by cardiology for new onset A. fib/flutter. Doppler lower extremity negative for DVT. Patient initiated on heparin drip while cardiology was evaluating stroke risk and need for long-term anticoagulation.  1.  A. fib/flutter with RVR: Patient evaluated by cardiology and was weaned off Cardizem drip within 24 hours of admission.  He was transitioned to oral Cardizem CD which was titrated to 240 mg daily and metoprolol was added at 25 mg twice daily for optimal heart rate control.  Echo shows preserved EF with  diastolic dysfunction. TSH within normal limits. Patient did have occasions of tachycardia with minimal exertion and cardiology planned for TEE cardioversion today and maintain him on heparin drip. Chads vas score is 1-2. Cardiology however felt today that TEE would not be safe given history of esophageal disease and recommended 3 to 4 weeks of uninterrupted anticoagulation with Eliquis followed by outpatient cardioversion.  They recommended discharging patient on Cardizem CD/metoprolol at above-mentioned doses and continue Lasix at 40 mg daily with potassium supplementations for leg swellings.  He is advised to follow-up cardiology clinic in 7 to 10 days.  2.  Bilateral leg edema: Secondary to diastolic CHF/problem #1 with underlying chronic venous stasis.  He reports prior history of leg edema which had resolved with transient use of diuretics.  Has had worsening leg edema over the last couple of months.Echo from 7/11 shows normal systolic function, with an ejection fraction of 55-60% with mildly increased left ventricular wall thickness. Left ventricular diastolic Doppler parameters are consistent with impaired relaxation. Patient received Lasix IV while here and reports improvement in leg swellings although still edematous..  Doppler of bilateral lower extremities negative for DVT.  Small puncture wound on the anterior shin appears to be healing and patient believes it developed secondary to insect bite/scratching.  Patient may follow-up with his primary podiatrist upon discharge.  Meanwhile will discharge on Lasix at 40 mg daily with potassium supplementation.  He was also advised regarding holding parameters given hypotension (asymptomatic) in the setting of multiple AV blockers.  Patient should have repeat labs including BMP in 5 days through PCP.  3.  Chronic anemia: Stable.  Resume outpatient vitamin/B12 supplements.    4.  Chronic back pain: Patient on MS Contin/oxycodone IR at home which has  been resumed here.  5.  Alcohol abuse: Drinks 4 cans of beer per day and wine sometimes with dinner.  Denies prior history of alcohol withdrawals.  Does not have other clinical stigmata of alcohol withdrawal.   Advised risk of worsening problem #1 with ongoing alcohol abuse.  He is optimistic that he can quit  6.  History of gout: On allopurinol   Discharge Exam:  Vitals:   10/14/18 1200 10/14/18 1345  BP: (!) 84/60 98/63  Pulse: 70 67  Resp:  18  Temp:  98.1 F (36.7 C)  SpO2:  98%   Vitals:   10/14/18 0904 10/14/18 1030 10/14/18 1200 10/14/18 1345  BP: 139/74 126/78 (!) 84/60 98/63  Pulse: 98 93 70 67  Resp: 16   18  Temp: 98.4 F (36.9 C)   98.1 F (36.7 C)  TempSrc: Temporal   Oral  SpO2:    98%  Weight:      Height:        General: Pt is alert, awake, not in acute distress Cardiovascular: RRR, S1/S2 +, no rubs, no gallops Respiratory: CTA bilaterally, no wheezing, no rhonchi Abdominal: Soft, NT, ND, bowel sounds + Extremities: 2+ pitting lower extremity edema, no cyanosis  Discharge Condition:Stable CODE STATUS: Full code Diet recommendation: 2 g sodium Recommendations for Outpatient Follow-up:  1. Follow up with PCP: 5 days with repeat labs including BMP 2. Follow up with consultants: Cardiology in 7 to 10 days  [place] services upon discharge: None Equipment/Devices upon discharge: None   Discharge Instructions:  Discharge Instructions    ([place]) Call MD:  Anytime you have any of the following symptoms: 1) 3 pound weight gain in 24 hours or 5 pounds in 1 week 2) shortness of breath, with or without a dry hacking cough 3) swelling in the hands, feet or stomach 4) if you have to sleep on extra pillows at night in order to breathe.   Complete by: As directed    Call MD for:  difficulty breathing, headache or visual disturbances   Complete by: As directed    Call MD for:  persistant dizziness or light-headedness   Complete by: As  directed    Call MD for:  persistant nausea and vomiting   Complete by: As directed    Diet - low sodium heart healthy   Complete by: As directed    Increase activity slowly   Complete by: As directed      Allergies as of 10/14/2018      Reactions   No Known Allergies       Medication List    STOP taking these medications   diphenoxylate-atropine 2.5-0.025 MG tablet Commonly known as: Lomotil   indomethacin 50 MG capsule Commonly known as: INDOCIN   neomycin-polymyxin-hydrocortisone OTIC solution Commonly known as: CORTISPORIN   triamcinolone cream 0.1 % Commonly known as: KENALOG     TAKE these medications   allopurinol 100 MG tablet Commonly known as: ZYLOPRIM Take 5 tablets (500 mg total) by mouth daily. What changed: how much to take   apixaban 5 MG Tabs tablet Commonly known as: ELIQUIS Take 1 tablet (5 mg total) by mouth 2 (two) times daily.   Calcium 600 1500 (600 Ca) MG Tabs tablet Generic drug: calcium carbonate Take 600 mg of elemental calcium by mouth 2 (two) times daily.  Cetirizine HCl 10 MG Tbdp Take 10 mg by mouth daily as needed (for allergies).   cyanocobalamin 2000 MCG tablet Take 2,000 mcg by mouth daily.   diltiazem 240 MG 24 hr capsule Commonly known as: CARDIZEM CD Take 1 capsule (240 mg total) by mouth daily. Start taking on: October 15, 2018   docusate sodium 100 MG capsule Commonly known as: COLACE Take 1 capsule (100 mg total) by mouth 2 (two) times daily.   folic acid 1 MG tablet Commonly known as: FOLVITE Take 1 tablet (1 mg total) by mouth daily. Start taking on: October 15, 2018   furosemide 40 MG tablet Commonly known as: LASIX Take 1 tablet (40 mg total) by mouth daily. Hold foe SBP <100 What changed: additional instructions   ipratropium 0.03 % nasal spray Commonly known as: ATROVENT Place 1 spray into the nose daily as needed for rhinitis.   magnesium oxide 400 (241.3 Mg) MG tablet Commonly known as: MAG-OX Take  400 mg by mouth daily.   metoprolol tartrate 25 MG tablet Commonly known as: LOPRESSOR Take 1 tablet (25 mg total) by mouth 2 (two) times daily. Hold SYSTOLIC BP <696, HR <78   morphine 15 MG 12 hr tablet Commonly known as: MS CONTIN Take 15 mg by mouth every 12 (twelve) hours.   multivitamin tablet Take 1 tablet by mouth daily.   oxyCODONE 5 MG immediate release tablet Commonly known as: Oxy IR/ROXICODONE Take 1 tablet (5 mg total) by mouth every 4 (four) hours as needed for severe pain. In addition to current chronic pain medication What changed:   when to take this  additional instructions   potassium chloride 10 MEQ tablet Commonly known as: K-DUR Take 1 tablet (10 mEq total) by mouth daily. Start taking on: October 15, 2018   thiamine 100 MG tablet Take 1 tablet (100 mg total) by mouth daily. Start taking on: October 15, 2018      Follow-up Information    Erlene Quan, [place] Follow up on 10/25/2018.   Specialties: Cardiology, Radiology Why: at 10:45am for your follow up appt.  Contact information: [address] [place] [address] [phone]          Allergies  Allergen Reactions  . No Known Allergies       The results of significant diagnostics from this hospitalization (including imaging, microbiology, ancillary and laboratory) are listed below for reference.    Labs: BNP (last 3 results) Recent Labs    10/11/18 2028  BNP 778.2*   Basic Metabolic Panel: Recent Labs  Lab 10/11/18 2028 10/12/18 0445 10/13/18 0413 10/14/18 0615  NA 140 140 139 136  K 4.0 3.7 3.5 4.1  CL 100 102 101 100  CO2 30 24 27 27   GLUCOSE 122* 108* 95 100*  BUN 9 6* 7* 11  CREATININE 0.80 0.75 0.81 0.91  CALCIUM 9.1 8.9 9.0 9.0  MG 1.6*  --   --  1.6*   Liver Function Tests: Recent Labs  Lab 10/11/18 2038 10/12/18 0445  AST 68* 59*  ALT 35 32  ALKPHOS 113 113  BILITOT 0.7 1.2  PROT 7.0 6.7  ALBUMIN 3.3* 3.4*   No results for input(s):  LIPASE, AMYLASE in the last 168 hours. No results for input(s): AMMONIA in the last 168 hours. CBC: Recent Labs  Lab 10/11/18 2028 10/12/18 0445 10/13/18 0413 10/14/18 0615  WBC 4.1 5.8 4.1 5.0  NEUTROABS 2.5 3.4  --   --   HGB  11.9* 11.8* 10.7* 11.7*  HCT 34.3* 34.4* 31.2* 34.5*  MCV 104.9* 105.5* 105.1* 106.2*  PLT 161 170 134* 163   Cardiac Enzymes: No results for input(s): CKTOTAL, CKMB, CKMBINDEX, TROPONINI in the last 168 hours. BNP: Invalid input(s): POCBNP CBG: No results for input(s): GLUCAP in the last 168 hours. D-Dimer Recent Labs    10/11/18 2038  DDIMER 0.57*   Hgb A1c No results for input(s): HGBA1C in the last 72 hours. Lipid Profile No results for input(s): CHOL, HDL, LDLCALC, TRIG, CHOLHDL, LDLDIRECT in the last 72 hours. Thyroid function studies Recent Labs    10/12/18 0445  TSH 2.524   Anemia work up Recent Labs    10/12/18 0612  VITAMINB12 1,600*  FOLATE 27.6  FERRITIN 233  TIBC 263  IRON 77  RETICCTPCT 2.0   Urinalysis    Component Value Date/Time   COLORURINE STRAW (A) 10/11/2018 2040   APPEARANCEUR CLEAR 10/11/2018 2040   LABSPEC 1.003 (L) 10/11/2018 2040   PHURINE 8.0 10/11/2018 2040   GLUCOSEU NEGATIVE 10/11/2018 2040   HGBUR NEGATIVE 10/11/2018 2040   HGBUR negative 12/28/2009 1039   BILIRUBINUR NEGATIVE 10/11/2018 2040   BILIRUBINUR Negative 06/03/2012 1157   KETONESUR NEGATIVE 10/11/2018 2040   PROTEINUR NEGATIVE 10/11/2018 2040   UROBILINOGEN 0.2 06/03/2012 1157   UROBILINOGEN 0.2 12/28/2009 1039   NITRITE NEGATIVE 10/11/2018 2040   LEUKOCYTESUR NEGATIVE 10/11/2018 2040   Sepsis Labs Invalid input(s): PROCALCITONIN,  WBC,  LACTICIDVEN Microbiology Recent Results (from the past 240 hour(s))  SARS Coronavirus 2 (CEPHEID- Performed in [facility] hospital lab), Hosp Order     Status: None   Collection Time: 10/11/18  8:38 PM   Specimen: Nasopharyngeal Swab  Result Value Ref Range Status   SARS Coronavirus 2  NEGATIVE NEGATIVE Final    Comment: (NOTE) If result is NEGATIVE SARS-CoV-2 target nucleic acids are NOT DETECTED. The SARS-CoV-2 RNA is generally detectable in upper and lower  respiratory specimens during the acute phase of infection. The lowest  concentration of SARS-CoV-2 viral copies this assay can detect is 250  copies / mL. A negative result does not preclude SARS-CoV-2 infection  and should not be used as the sole basis for treatment or other  patient management decisions.  A negative result may occur with  improper specimen collection / handling, submission of specimen other  than nasopharyngeal swab, presence of viral mutation(s) within the  areas targeted by this assay, and inadequate number of viral copies  (<250 copies / mL). A negative result must be combined with clinical  observations, patient history, and epidemiological information. If result is POSITIVE SARS-CoV-2 target nucleic acids are DETECTED. The SARS-CoV-2 RNA is generally detectable in upper and lower  respiratory specimens dur ing the acute phase of infection.  Positive  results are indicative of active infection with SARS-CoV-2.  Clinical  correlation with patient history and other diagnostic information is  necessary to determine patient infection status.  Positive results do  not rule out bacterial infection or co-infection with other viruses. If result is PRESUMPTIVE POSTIVE SARS-CoV-2 nucleic acids MAY BE PRESENT.   A presumptive positive result was obtained on the submitted specimen  and confirmed on repeat testing.  While 2019 novel coronavirus  (SARS-CoV-2) nucleic acids may be present in the submitted sample  additional confirmatory testing may be necessary for epidemiological  and / or clinical management purposes  to differentiate between  SARS-CoV-2 and other Sarbecovirus currently known to infect humans.  If clinically indicated additional testing  with an alternate test  methodology [phone])  is advised. The SARS-CoV-2 RNA is generally  detectable in upper and lower respiratory sp ecimens during the acute  phase of infection. The expected result is Negative. Fact Sheet for Patients:  StrictlyIdeas.no Fact Sheet for Healthcare Providers: BankingDealers.co.za This test is not yet approved or cleared by the [place] FDA and has been authorized for detection and/or diagnosis of SARS-CoV-2 by FDA under an Emergency Use Authorization (EUA).  This EUA will remain in effect (meaning this test can be used) for the duration of the COVID-19 declaration under Section 564(b)(1) of the Act, 21 U.S.C. section 360bbb-3(b)(1), unless the authorization is terminated or revoked sooner. Performed at [facility], [place] [address] [place], [place] [address]     Procedures/Studies: Dg Chest Portable 1 View  Result Date: 10/11/2018 CLINICAL DATA:  Atrial fibrillation. Orthopnea. Shortness of breath. EXAM: PORTABLE CHEST 1 VIEW COMPARISON:  June 15, 2016 FINDINGS: The heart size and mediastinal contours are within normal limits. Both lungs are clear. The visualized skeletal structures are unremarkable. IMPRESSION: No active disease. Electronically Signed   By: Dorise Bullion III M.D   On: 10/11/2018 19:47   Vas [place] Lower Extremity Venous (dvt)  Result Date: 10/14/2018  Lower Venous Study Indications: Edema.  Comparison Study: No prior study on file for comparison Performing Technologist: Sharion Dove RVS  Examination Guidelines: A complete evaluation includes B-mode imaging, spectral Doppler, color Doppler, and power Doppler as needed of all accessible portions of each vessel. Bilateral testing is considered an integral part of a complete examination. Limited examinations for reoccurring indications may be performed as noted.  +---------+---------------+---------+-----------+----------+-------+ RIGHT     CompressibilityPhasicitySpontaneityPropertiesSummary +---------+---------------+---------+-----------+----------+-------+ CFV      Full           Yes      Yes                          +---------+---------------+---------+-----------+----------+-------+ SFJ      Full                                                 +---------+---------------+---------+-----------+----------+-------+ FV Prox  Full                                                 +---------+---------------+---------+-----------+----------+-------+ FV Mid   Full                                                 +---------+---------------+---------+-----------+----------+-------+ FV DistalFull                                                 +---------+---------------+---------+-----------+----------+-------+ PFV      Full                                                 +---------+---------------+---------+-----------+----------+-------+  POP      Full           Yes      Yes                          +---------+---------------+---------+-----------+----------+-------+ PTV      Full                                                 +---------+---------------+---------+-----------+----------+-------+ PERO     Full                                                 +---------+---------------+---------+-----------+----------+-------+   +---------+---------------+---------+-----------+----------+-------+ LEFT     CompressibilityPhasicitySpontaneityPropertiesSummary +---------+---------------+---------+-----------+----------+-------+ CFV      Full           Yes      Yes                          +---------+---------------+---------+-----------+----------+-------+ SFJ      Full                                                 +---------+---------------+---------+-----------+----------+-------+ FV Prox  Full                                                  +---------+---------------+---------+-----------+----------+-------+ FV Mid   Full                                                 +---------+---------------+---------+-----------+----------+-------+ FV DistalFull                                                 +---------+---------------+---------+-----------+----------+-------+ PFV      Full                                                 +---------+---------------+---------+-----------+----------+-------+ POP      Full           Yes      Yes                          +---------+---------------+---------+-----------+----------+-------+ PTV      Full                                                 +---------+---------------+---------+-----------+----------+-------+ PERO  Full                                                 +---------+---------------+---------+-----------+----------+-------+     Summary: Right: There is no evidence of deep vein thrombosis in the lower extremity. Left: There is no evidence of deep vein thrombosis in the lower extremity.  *See table(s) above for measurements and observations.    Preliminary      Time coordinating discharge: Over 30 minutes  SIGNED:   Guilford Shi, MD  [facility] 10/14/2018, 4:52 PM Pager : [phone]

## 2018-10-14 NOTE — Progress Notes (Signed)
Patient added on to TEE-CV schedule. On interview, he endorses a history of esophageal diverticulum with surgery and prior dilation, states "there is still a staple in place."  Given this, he has a contraindication to TEE. My recommendation is at least 3 weeks of uninterrupted anticoagulation and return for outpatient CV to avoid TEE.  Recommendations communicated to primary team.  Buford Dresser, MD, PhD [facility]  [address] [place], [place] [address] [phone]

## 2018-10-14 NOTE — Consult Note (Signed)
[facility] Nurse wound consult note Reason for Consult: Right shin wound.  Believed to be insect bite, per patient.  Edema present to bilateral lower legs. Wound type: inflammtory Pressure Injury POA: NA Measurement:0.3 cm lesion to right lower leg, just above dorsal foot.  Wound YVG:CYOY Drainage (amount, consistency, odor) none Periwound:edema and erythema to bilateral lower legs.  Dressing procedure/placement/frequency:Dry dressing to wound on right lower leg.  Will not follow at this time.  Please re-consult if needed.  Domenic Moras MSN, RN, FNP-BC [facility] Wound, Ostomy, Continence Nurse Pager [phone]

## 2018-10-14 NOTE — TOC Benefit Eligibility Note (Signed)
Transition of Care [facility]) Benefit Eligibility Note    Patient Details  Name: [name] MRN: [id number] Date of Birth: 04/10/1945               Spoke with Person/Company/Phone Number:: Larene Beach           Additional Notes: Patient has not coverag he is with [employer]  Patient has no coverage he is with the [employer] Phone Number: 10/14/2018, 3:11 PM

## 2018-10-14 NOTE — Progress Notes (Addendum)
ANTICOAGULATION CONSULT NOTE - Follow Up Consult  Pharmacy Consult for heparin Indication: atrial fibrillation  Allergies  Allergen Reactions  . No Known Allergies     Patient Measurements: Height: 6\' 1"  (185.4 cm) Weight: 207 lb 4.8 oz (94 kg) IBW/kg (Calculated) : 79.9 Heparin Dosing Weight: 92 kg  Vital Signs: Temp: 98.4 F (36.9 C) (07/13 0904) Temp Source: Temporal (07/13 0904) BP: 126/78 (07/13 1030) Pulse Rate: 93 (07/13 1030)  Labs: Recent Labs    10/11/18 2028 10/12/18 0445  10/12/18 0612  10/13/18 0413 10/13/18 0550 10/13/18 1551 10/14/18 0615  HGB 11.9* 11.8*  --   --   --  10.7*  --   --  11.7*  HCT 34.3* 34.4*  --   --   --  31.2*  --   --  34.5*  PLT 161 170  --   --   --  134*  --   --  163  HEPARINUNFRC  --   --    < >  --    < >  --  0.37 0.33 0.64  CREATININE 0.80 0.75  --   --   --  0.81  --   --  0.91  TROPONINIHS 11 14  --  14  --   --   --   --   --    < > = values in this interval not displayed.    Estimated Creatinine Clearance: 81.7 mL/min (by C-G formula based on SCr of 0.91 mg/dL).  Assessment: 73yo male was sent to ED by podiatry for evaluation of possible DVT, pt c/o BLE swelling x37mo, D-dimer found to be elevated, and patient was in Afib RVR. Preliminary results from venous duplex on 7/11 negative for DVT.   Heparin level therapeutic at 0.64 on drip rate of 2200 units/hr. No bleeding noted, Hgb stable in 11s, platelets have trended up to normal.  Goal of Therapy:  Heparin level 0.3-0.7 units/ml Monitor platelets by anticoagulation protocol: Yes   Plan:  Continue heparin infusion at 2200 units/hr Check daily heparin level and CBC Monitor for s/sx of bleeding F/U change to DOAC   Thank you for involving pharmacy in this patient's care.  Renold Genta, PharmD, BCPS Clinical Pharmacist Clinical phone for 10/14/2018 until 3p is [phone] 10/14/2018 11:21 AM  **Pharmacist phone directory can be found on [email or web address] listed under [place]**  Addendum: Transitioning to apixaban 5 mg PO bid. Full dose appropriate for age <80 and weight >60 kg. Education prior to d/c Pharmacy signing off but will follow peripherally  Renold Genta, PharmD, BCPS 1:54 PM

## 2018-10-14 NOTE — Progress Notes (Addendum)
The patient has been seen in conjunction with Reino Bellis, NP. All aspects of care have been considered and discussed. The patient has been personally interviewed, examined, and all clinical data has been reviewed.   Because of the esophageal disease, TEE guided cardioversion was not possible/safe.  Plan 3 to 4 weeks of uninterrupted anticoagulation with apixaban followed by outpatient cardioversion.  He still appears volume overloaded.  Blood pressure is relatively low, therefore more aggressive diuresis is not possible.  Would discharge on 40 mg of furosemide daily, metoprolol tartrate 25 mg twice daily, and diltiazem CD 240 mg/day.  Will also need potassium supplement.  Will need early cardiology follow-up 7 to 10 days to reassess rate control and to arrange outpatient cardioversion 3 to 4 weeks hence.  On the upcoming office visit in 7 to 10 days will need to have basic metabolic panel.  Stop alcohol    Progress Note  Patient Name: [name] Date of Encounter: 10/14/2018  Primary Cardiologist: No primary care provider on file.   Subjective   Anxious to be discharged today.   Inpatient Medications    Scheduled Meds:  allopurinol  300 mg Oral Daily   calcium carbonate  500 mg of elemental calcium Oral BID   diltiazem  240 mg Oral Daily   docusate sodium  100 mg Oral BID   folic acid  1 mg Oral Daily   furosemide  20 mg Intravenous BID   magnesium oxide  400 mg Oral Daily   metoprolol tartrate  25 mg Oral BID   morphine  15 mg Oral Q12H   multivitamin with minerals  1 tablet Oral Daily   potassium chloride SA  20 mEq Oral Daily   thiamine  100 mg Oral Daily   Or   thiamine  100 mg Intravenous Daily   cyanocobalamin  2,000 mcg Oral Daily   Continuous Infusions:  heparin 2,200 Units/hr (10/13/18 2216)   PRN Meds: acetaminophen **OR** acetaminophen, LORazepam **OR** LORazepam, metoprolol tartrate, ondansetron **OR** ondansetron (ZOFRAN) IV,  oxyCODONE, polyethylene glycol   Vital Signs    Vitals:   10/13/18 0716 10/13/18 1344 10/13/18 2020 10/14/18 0611  BP: 134/89 106/69 (!) 94/50 110/70  Pulse:  70 70 68  Resp:   16 18  Temp:  (!) 97.5 F (36.4 C) 97.7 F (36.5 C) 97.9 F (36.6 C)  TempSrc:  Oral Oral Oral  SpO2:  100% 100% 97%  Weight:    94 kg  Height:        Intake/Output Summary (Last 24 hours) at 10/14/2018 0812 Last data filed at 10/13/2018 2216 Gross per 24 hour  Intake 480 ml  Output 1850 ml  Net -1370 ml   Last 3 Weights 10/14/2018 10/13/2018 10/12/2018  Weight (lbs) 207 lb 4.8 oz 202 lb 12.8 oz 206 lb 9.1 oz  Weight (kg) 94.031 kg 91.989 kg 93.7 kg      Telemetry    Atrial flutter, reading out SR this morning but significant artifact, appear flutter is still present - Personally Reviewed  ECG    Atrial Flutter rate 67 - Personally Reviewed  Physical Exam   GEN: No acute distress.   Neck: No JVD Cardiac: Reg, no murmurs, rubs, or gallops.  Respiratory: Clear to auscultation bilaterally. GI: Soft, nontender, non-distended  MS: No edema; No deformity. Neuro:  Nonfocal  Psych: Normal affect   Labs    High Sensitivity Troponin:   Recent Labs  Lab 10/11/18 2028 10/12/18 0445 10/12/18 7902  TROPONINIHS 11 14 14       Cardiac EnzymesNo results for input(s): TROPONINI in the last 168 hours. No results for input(s): TROPIPOC in the last 168 hours.   Chemistry Recent Labs  Lab 10/11/18 2038 10/12/18 0445 10/13/18 0413 10/14/18 0615  NA  --  140 139 136  K  --  3.7 3.5 4.1  CL  --  102 101 100  CO2  --  24 27 27   GLUCOSE  --  108* 95 100*  BUN  --  6* 7* 11  CREATININE  --  0.75 0.81 0.91  CALCIUM  --  8.9 9.0 9.0  PROT 7.0 6.7  --   --   ALBUMIN 3.3* 3.4*  --   --   AST 68* 59*  --   --   ALT 35 32  --   --   ALKPHOS 113 113  --   --   BILITOT 0.7 1.2  --   --   GFRNONAA  --  >60 >60 >60  GFRAA  --  >60 >60 >60  ANIONGAP  --  14 11 9      Hematology Recent Labs  Lab  10/12/18 0445 10/12/18 0612 10/13/18 0413 10/14/18 0615  WBC 5.8  --  4.1 5.0  RBC 3.26* 3.07* 2.97* 3.25*  HGB 11.8*  --  10.7* 11.7*  HCT 34.4*  --  31.2* 34.5*  MCV 105.5*  --  105.1* 106.2*  MCH 36.2*  --  36.0* 36.0*  MCHC 34.3  --  34.3 33.9  RDW 12.0  --  12.2 12.2  PLT 170  --  134* 163    BNP Recent Labs  Lab 10/11/18 2028  BNP 234.9*     DDimer  Recent Labs  Lab 10/11/18 2038  DDIMER 0.57*     Radiology    Vas [place] Lower Extremity Venous (dvt)  Result Date: 10/12/2018  Lower Venous Study Indications: Swelling.  Risk Factors: A-Fib, CHF. Limitations: Patient too ticklish to compress in groin. Comparison Study: No prior study on file for comparison Performing Technologist: Sharion Dove RVS  Examination Guidelines: A complete evaluation includes B-mode imaging, spectral Doppler, color Doppler, and power Doppler as needed of all accessible portions of each vessel. Bilateral testing is considered an integral part of a complete examination. Limited examinations for reoccurring indications may be performed as noted.  +---------+---------------+---------+-----------+----------+-------------------+  RIGHT     Compressibility Phasicity Spontaneity Properties Summary              +---------+---------------+---------+-----------+----------+-------------------+  CFV                       Yes       Yes                    patent by color and                                                              Doppler              +---------+---------------+---------+-----------+----------+-------------------+  FV Prox   Full                                                                  +---------+---------------+---------+-----------+----------+-------------------+  FV Mid    Full                                                                  +---------+---------------+---------+-----------+----------+-------------------+  FV Distal Full                                                                   +---------+---------------+---------+-----------+----------+-------------------+  PFV       Full                                                                  +---------+---------------+---------+-----------+----------+-------------------+  POP       Full            Yes       Yes                                         +---------+---------------+---------+-----------+----------+-------------------+  PTV       Full                                                                  +---------+---------------+---------+-----------+----------+-------------------+  PERO      Full                                                                  +---------+---------------+---------+-----------+----------+-------------------+   +---------+---------------+---------+-----------+----------+-------------------+  LEFT      Compressibility Phasicity Spontaneity Properties Summary              +---------+---------------+---------+-----------+----------+-------------------+  CFV                       Yes       Yes                    patent by color and                                                              Doppler              +---------+---------------+---------+-----------+----------+-------------------+  SFJ  Yes       Yes                                         +---------+---------------+---------+-----------+----------+-------------------+  FV Prox   Full                                                                  +---------+---------------+---------+-----------+----------+-------------------+  FV Mid    Full                                                                  +---------+---------------+---------+-----------+----------+-------------------+  FV Distal Full                                                                  +---------+---------------+---------+-----------+----------+-------------------+  PFV       Full                                                                   +---------+---------------+---------+-----------+----------+-------------------+  POP       Full            Yes       Yes                                         +---------+---------------+---------+-----------+----------+-------------------+  PTV       Full                                                                  +---------+---------------+---------+-----------+----------+-------------------+  PERO      Full                                                                  +---------+---------------+---------+-----------+----------+-------------------+     Summary: Right: There is no evidence of deep vein thrombosis in the lower extremity. Left: There is no evidence of deep vein thrombosis in the lower extremity.  *See table(s) above for measurements and observations.  Preliminary     Cardiac Studies   TTE: 10/12/18  IMPRESSIONS    1. The left ventricle has normal systolic function, with an ejection fraction of 55-60%. The cavity size was normal. There is mildly increased left ventricular wall thickness. Left ventricular diastolic Doppler parameters are consistent with impaired  relaxation.  2. The right ventricle has normal systolic function. The cavity was normal. There is no increase in right ventricular wall thickness. Right ventricular systolic pressure is mildly elevated with an estimated pressure of 36.0 mmHg.  3. Left atrial size was mildly dilated.  4. There is mild mitral annular calcification present.  5. The aortic valve is tricuspid. Mild thickening of the aortic valve. Mild calcification of the aortic valve. Aortic valve regurgitation is trivial by color flow Doppler.  6. The inferior vena cava was dilated in size with <50% respiratory variability.  Patient Profile     73 y.o. male with a hx of gout, alcoholismwho was seen for the evaluation of atrial fibrillation.   Assessment & Plan    1. Atrial Fib with RVR: noted as new on admission. Remains on IV heparin.  Planned for TEE/DCCV per Dr. Gwenlyn Found today. Telemetry reading SR but significant artifact. Will recheck EKG, suspect still in atrial flutter as rate increased with standing at the side of the bed. Has been NPO.   2. Diastolic HF: preserved EF on echo, with impaired diastolic function. Has been diuresing with IV lasix. Weight does not appear to be accurate. Net - 2.2L.   3. ETOH abuse: currently on CIWA protocol.   4. Hypomag: 1.6 this morning. Supplement due at 10am.   For questions or updates, please contact [place] Please consult [email or web address] for contact info under    Signed, Reino Bellis, NP  10/14/2018, 8:12 AM    Addendum: Patient unable to undergo TEE 2/2 to hx of Zenker Diverticulum and reporting having a staple in his esophagus from prior procedure. Discussed with the patient the need for [facility] for 3-4 weeks with outpatient DCCV at that time. Reviewed NOACs with the patient. Recommend starting Eliquis 5mg  BID. Will need CM to review cost prior to discharge.   SignedReino Bellis, NP-C 10/14/2018, 11:41 AM Pager: [phone]

## 2018-10-14 NOTE — Anesthesia Preprocedure Evaluation (Signed)
Anesthesia Evaluation    Reviewed: Allergy & Precautions, Patient's Chart, lab work & pertinent test results  History of Anesthesia Complications Negative for: history of anesthetic complications  Airway        Dental   Pulmonary neg pulmonary ROS,           Cardiovascular + dysrhythmias Atrial Fibrillation      Neuro/Psych negative neurological ROS  negative psych ROS   GI/Hepatic GERD  ,(+)     substance abuse  alcohol use,   Endo/Other  negative endocrine ROS  Renal/GU negative Renal ROS  negative genitourinary   Musculoskeletal  (+) Arthritis  (gout), narcotic dependent  Abdominal   Peds  Hematology negative hematology ROS (+)   Anesthesia Other Findings Echo 10/12/18: EF 55-60%, RVSP 36, no significant valve findings  Reproductive/Obstetrics                             Anesthesia Physical Anesthesia Plan  ASA: III  Anesthesia Plan: MAC   Post-op Pain Management:    Induction: Intravenous  PONV Risk Score and Plan: 1 and Propofol infusion and Treatment may vary due to age or medical condition  Airway Management Planned: Nasal Cannula, Natural Airway and Simple Face Mask  Additional Equipment: None  Intra-op Plan:   Post-operative Plan:   Informed Consent:   Plan Discussed with:   Anesthesia Plan Comments:         Anesthesia Quick Evaluation

## 2018-10-24 ENCOUNTER — Telehealth: Payer: Self-pay | Admitting: Cardiology

## 2018-10-24 NOTE — Telephone Encounter (Signed)

## 2018-10-25 ENCOUNTER — Ambulatory Visit (INDEPENDENT_AMBULATORY_CARE_PROVIDER_SITE_OTHER): Payer: Medicare Other | Admitting: Cardiology

## 2018-10-25 ENCOUNTER — Other Ambulatory Visit: Payer: Self-pay

## 2018-10-25 ENCOUNTER — Encounter: Payer: Self-pay | Admitting: Cardiology

## 2018-10-25 VITALS — BP 125/71 | HR 62 | Temp 96.8°F | Ht 73.0 in | Wt 203.0 lb

## 2018-10-25 DIAGNOSIS — Z01810 Encounter for preprocedural cardiovascular examination: Secondary | ICD-10-CM

## 2018-10-25 DIAGNOSIS — I4891 Unspecified atrial fibrillation: Secondary | ICD-10-CM

## 2018-10-25 DIAGNOSIS — I5043 Acute on chronic combined systolic (congestive) and diastolic (congestive) heart failure: Secondary | ICD-10-CM | POA: Diagnosis not present

## 2018-10-25 DIAGNOSIS — Z7901 Long term (current) use of anticoagulants: Secondary | ICD-10-CM | POA: Insufficient documentation

## 2018-10-25 HISTORY — DX: Unspecified atrial fibrillation: I48.91

## 2018-10-25 NOTE — Progress Notes (Addendum)
Cardiology Office Note:    Date:  10/25/2018   ID:  [name], DOB 1945-09-17, MRN [id number]  PCP:  Patient, No Pcp Per  Cardiologist:  Dr Gwenlyn Found Electrophysiologist:  None   Referring MD: No ref. provider found   No chief complaint on file.   History of Present Illness:    [name] is a 73 y.o. male followed at the [facility] with a hx of chronic gout pain who presented to the ED 10/11/2018 with LE edema, SOB, and fatigue.  He was noted to be in AF with RVR. He had normal TSH.  Diltiazem was added for rate control.  He was started on IV lasix and diuresed 4-5 lbs.  TEE was arranged but the patient has a history of Zenker's diverticulum with prior stapling and there was still a staple in place.  He is seen in the office today for follow up.  He has done well since discharge, no SOB or edema.   Past Medical History:  Diagnosis Date  . Chronic back pain    buldging disc  . Depression   . GERD (gastroesophageal reflux disease)   . GOUT    takes Allopurinol daily and Colchicine as needed  . History of bronchitis    many yrs ago  . History of colonoscopy    benign  . History of shingles   . Joint pain   . Joint swelling   . Muscle spasm    takes Robaxin as needed  . Nocturia   . OSTEOARTHRITIS   . Peripheral edema    takes Lasix daily as needed  . Pneumonia 1969  . Seasonal allergies    takes Zyrtec and uses Flonase is needed  . TESTICULAR HYPOFUNCTION   . Zenker diverticula     Past Surgical History:  Procedure Laterality Date  . ANKLE SURGERY     4 times  . COLONOSCOPY    . ELBOW SURGERY Right    torn tendon  . INGUINAL HERNIA REPAIR     right  . KNEE SURGERY     3 on each knee  . SHOULDER ARTHROSCOPY Right 06/15/2015   Procedure: RIGHT SHOULDER ARTHROSCOPY, ACROMIOPLASTY, DEBRIDEMENT;  Surgeon: Melrose Nakayama, MD;  Location: [facility];  Service: Orthopedics;  Laterality: Right;  . shoulder arthrosopy Left   . TONSILLECTOMY AND ADENOIDECTOMY    . TUMOR  EXCISION     sebaceous cyst removed from chest  . ZENKER'S DIVERTICULECTOMY ENDOSCOPIC N/A 06/14/2016   Procedure: Stanford Breed DIVERTICULECTOMY ENDOSCOPIC;  Surgeon: Izora Gala, MD;  Location: [facility] OR;  Service: ENT;  Laterality: N/A;    Current Medications: Current Meds  Medication Sig  . allopurinol (ZYLOPRIM) 100 MG tablet Take 5 tablets (500 mg total) by mouth daily. (Patient taking differently: Take 300 mg by mouth daily. )  . apixaban (ELIQUIS) 5 MG TABS tablet Take 1 tablet (5 mg total) by mouth 2 (two) times daily.  Marland Kitchen CALCIUM 600 1500 (600 Ca) MG TABS tablet Take 600 mg of elemental calcium by mouth 2 (two) times daily.   . Cetirizine HCl 10 MG TBDP Take 10 mg by mouth daily as needed (for allergies).   . cyanocobalamin 2000 MCG tablet Take 2,000 mcg by mouth daily.  Marland Kitchen diltiazem (CARDIZEM CD) 240 MG 24 hr capsule Take 1 capsule (240 mg total) by mouth daily.  Marland Kitchen docusate sodium (COLACE) 100 MG capsule Take 1 capsule (100 mg total) by mouth 2 (two) times daily. (Patient taking differently: Take 100  mg by mouth 2 (two) times daily. )  . folic acid (FOLVITE) 1 MG tablet Take 1 tablet (1 mg total) by mouth daily.  . furosemide (LASIX) 40 MG tablet Take 1 tablet (40 mg total) by mouth daily. Hold foe SBP <100  . ipratropium (ATROVENT) 0.03 % nasal spray Place 1 spray into the nose daily as needed for rhinitis.   . magnesium oxide (MAG-OX) 400 (241.3 Mg) MG tablet Take 400 mg by mouth daily.   . metoprolol tartrate (LOPRESSOR) 25 MG tablet Take 1 tablet (25 mg total) by mouth 2 (two) times daily. Hold SYSTOLIC BP <147, HR <82  . morphine (MS CONTIN) 15 MG 12 hr tablet Take 15 mg by mouth every 12 (twelve) hours.  . Multiple Vitamin (MULTIVITAMIN) tablet Take 1 tablet by mouth daily.  Marland Kitchen oxyCODONE (OXY IR/ROXICODONE) 5 MG immediate release tablet Take 1 tablet (5 mg total) by mouth every 4 (four) hours as needed for severe pain. In addition to current chronic pain medication (Patient taking  differently: Take 5 mg by mouth every 4 (four) hours. )  . potassium chloride SA (K-DUR) 10 MEQ tablet Take 1 tablet (10 mEq total) by mouth daily.  Marland Kitchen thiamine 100 MG tablet Take 1 tablet (100 mg total) by mouth daily.     Allergies:   No known allergies   Social History   Socioeconomic History  . Marital status: Married    Spouse name: Not on file  . Number of children: Not on file  . Years of education: Not on file  . Highest education level: Not on file  Occupational History  . Not on file  Social Needs  . Financial resource strain: Not on file  . Food insecurity    Worry: Not on file    Inability: Not on file  . Transportation needs    Medical: Not on file    Non-medical: Not on file  Tobacco Use  . Smoking status: Never Smoker  . Smokeless tobacco: Never Used  Substance and Sexual Activity  . Alcohol use: Yes    Alcohol/week: 22.0 standard drinks    Types: 10 Standard drinks or equivalent, 12 Cans of beer per week    Comment: daily  . Drug use: No  . Sexual activity: Not on file  Lifestyle  . Physical activity    Days per week: Not on file    Minutes per session: Not on file  . Stress: Not on file  Relationships  . Social Herbalist on phone: Not on file    Gets together: Not on file    Attends religious service: Not on file    Active member of club or organization: Not on file    Attends meetings of clubs or organizations: Not on file    Relationship status: Not on file  Other Topics Concern  . Not on file  Social History Narrative  . Not on file     Family History: The patient's Family history is unknown by patient.  ROS:   Please see the history of present illness.    Some constipation attributed to Diltiazem.  All other systems reviewed and are negative.  EKGs/Labs/Other Studies Reviewed:    The following studies were reviewed today:   EKG:  EKG is ordered today.  The ekg ordered today demonstrates Atrial flutter with variable VR-HR  62  Recent Labs: 10/11/2018: B Natriuretic Peptide 234.9 10/12/2018: ALT 32; TSH 2.524 10/14/2018: BUN 11; Creatinine, Ser 0.91;  Hemoglobin 11.7; Magnesium 1.6; Platelets 163; Potassium 4.1; Sodium 136  Recent Lipid Panel    Component Value Date/Time   CHOL 143 11/08/2016 1116   TRIG 246.0 (H) 11/08/2016 1116   HDL 44.90 11/08/2016 1116   CHOLHDL 3 11/08/2016 1116   VLDL 49.2 (H) 11/08/2016 1116   LDLDIRECT 64.0 11/08/2016 1116    Physical Exam:    VS:  BP 125/71   Pulse 62   Temp (!) 96.8 F (36 C)   Ht 6\' 1"  (1.854 m)   Wt 203 lb (92.1 kg)   SpO2 98%   BMI 26.78 kg/m     Wt Readings from Last 3 Encounters:  10/25/18 203 lb (92.1 kg)  10/14/18 207 lb 4.8 oz (94 kg)  06/06/18 203 lb (92.1 kg)     GEN:  Well nourished, well developed in no acute distress HEENT: Normal NECK: No JVD; No carotid bruits LYMPHATICS: No lymphadenopathy CARDIAC: irregularly irregular, no murmurs, rubs, gallops RESPIRATORY:  Clear to auscultation without rales, wheezing or rhonchi  ABDOMEN: Soft, non-tender, non-distended MUSCULOSKELETAL:  No edema; No deformity  SKIN: Warm and dry NEUROLOGIC:  Alert and oriented x 3 PSYCHIATRIC:  Normal affect   ASSESSMENT:    Atrial fibrillation ([place]) New onset 10/11/2018- unable to do TEE secondary to h/o Zenker's diverticulum with staple in place.  Plan Op DCCV after 3 weeks of OP anticoagulation.  Anticoagulated CHADS VASC=3  PLAN:    OP DCCV. Risks and benefits explained to the patient and he is willing to proceed. Orders have been written. His HR is controlled-62.   I'll have him hold his medications except Eliquis the morning of his procedure, we may be able to stop the Diltiazem or Metoprolol post cardioversion.  He is supposed to have a procedure on his eye in August and I told him he would not be able to stop Eliquis for several weeks post cardioversion.   Medication Adjustments/Labs and Tests Ordered: Current medicines are reviewed at  length with the patient today.  Concerns regarding medicines are outlined above.  No orders of the defined types were placed in this encounter.  No orders of the defined types were placed in this encounter.   There are no Patient Instructions on file for this visit.   Angelena Form, PA-C  10/25/2018 11:25 AM    [facility]

## 2018-10-25 NOTE — Assessment & Plan Note (Addendum)
CHADS VASC=2

## 2018-10-25 NOTE — Assessment & Plan Note (Signed)
New onset 10/11/2018- unable to do TEE secondary to h/o Zenker's diverticulum with staple in place.  Plan Op DCCV after 3 weeks of OP anticoagulation.

## 2018-10-25 NOTE — H&P (View-Only) (Signed)
Cardiology Office Note:    Date:  10/25/2018   ID:  [name], DOB 05-01-1945, MRN [id number]  PCP:  Patient, No Pcp Per  Cardiologist:  Dr Gwenlyn Found Electrophysiologist:  None   Referring MD: No ref. provider found   No chief complaint on file.   History of Present Illness:    [name] is a 73 y.o. male followed at the [facility] with a hx of chronic gout pain who presented to the ED 10/11/2018 with LE edema, SOB, and fatigue.  He was noted to be in AF with RVR. He had normal TSH.  Diltiazem was added for rate control.  He was started on IV lasix and diuresed 4-5 lbs.  TEE was arranged but the patient has a history of Zenker's diverticulum with prior stapling and there was still a staple in place.  He is seen in the office today for follow up.  He has done well since discharge, no SOB or edema.   Past Medical History:  Diagnosis Date  . Chronic back pain    buldging disc  . Depression   . GERD (gastroesophageal reflux disease)   . GOUT    takes Allopurinol daily and Colchicine as needed  . History of bronchitis    many yrs ago  . History of colonoscopy    benign  . History of shingles   . Joint pain   . Joint swelling   . Muscle spasm    takes Robaxin as needed  . Nocturia   . OSTEOARTHRITIS   . Peripheral edema    takes Lasix daily as needed  . Pneumonia 1969  . Seasonal allergies    takes Zyrtec and uses Flonase is needed  . TESTICULAR HYPOFUNCTION   . Zenker diverticula     Past Surgical History:  Procedure Laterality Date  . ANKLE SURGERY     4 times  . COLONOSCOPY    . ELBOW SURGERY Right    torn tendon  . INGUINAL HERNIA REPAIR     right  . KNEE SURGERY     3 on each knee  . SHOULDER ARTHROSCOPY Right 06/15/2015   Procedure: RIGHT SHOULDER ARTHROSCOPY, ACROMIOPLASTY, DEBRIDEMENT;  Surgeon: Melrose Nakayama, MD;  Location: [place];  Service: Orthopedics;  Laterality: Right;  . shoulder arthrosopy Left   . TONSILLECTOMY AND ADENOIDECTOMY    . TUMOR  EXCISION     sebaceous cyst removed from chest  . ZENKER'S DIVERTICULECTOMY ENDOSCOPIC N/A 06/14/2016   Procedure: Stanford Breed DIVERTICULECTOMY ENDOSCOPIC;  Surgeon: Izora Gala, MD;  Location: [facility] OR;  Service: ENT;  Laterality: N/A;    Current Medications: Current Meds  Medication Sig  . allopurinol (ZYLOPRIM) 100 MG tablet Take 5 tablets (500 mg total) by mouth daily. (Patient taking differently: Take 300 mg by mouth daily. )  . apixaban (ELIQUIS) 5 MG TABS tablet Take 1 tablet (5 mg total) by mouth 2 (two) times daily.  Marland Kitchen CALCIUM 600 1500 (600 Ca) MG TABS tablet Take 600 mg of elemental calcium by mouth 2 (two) times daily.   . Cetirizine HCl 10 MG TBDP Take 10 mg by mouth daily as needed (for allergies).   . cyanocobalamin 2000 MCG tablet Take 2,000 mcg by mouth daily.  Marland Kitchen diltiazem (CARDIZEM CD) 240 MG 24 hr capsule Take 1 capsule (240 mg total) by mouth daily.  Marland Kitchen docusate sodium (COLACE) 100 MG capsule Take 1 capsule (100 mg total) by mouth 2 (two) times daily. (Patient taking differently: Take 100  mg by mouth 2 (two) times daily. )  . folic acid (FOLVITE) 1 MG tablet Take 1 tablet (1 mg total) by mouth daily.  . furosemide (LASIX) 40 MG tablet Take 1 tablet (40 mg total) by mouth daily. Hold foe SBP <100  . ipratropium (ATROVENT) 0.03 % nasal spray Place 1 spray into the nose daily as needed for rhinitis.   . magnesium oxide (MAG-OX) 400 (241.3 Mg) MG tablet Take 400 mg by mouth daily.   . metoprolol tartrate (LOPRESSOR) 25 MG tablet Take 1 tablet (25 mg total) by mouth 2 (two) times daily. Hold SYSTOLIC BP <010, HR <27  . morphine (MS CONTIN) 15 MG 12 hr tablet Take 15 mg by mouth every 12 (twelve) hours.  . Multiple Vitamin (MULTIVITAMIN) tablet Take 1 tablet by mouth daily.  Marland Kitchen oxyCODONE (OXY IR/ROXICODONE) 5 MG immediate release tablet Take 1 tablet (5 mg total) by mouth every 4 (four) hours as needed for severe pain. In addition to current chronic pain medication (Patient taking  differently: Take 5 mg by mouth every 4 (four) hours. )  . potassium chloride SA (K-DUR) 10 MEQ tablet Take 1 tablet (10 mEq total) by mouth daily.  Marland Kitchen thiamine 100 MG tablet Take 1 tablet (100 mg total) by mouth daily.     Allergies:   No known allergies   Social History   Socioeconomic History  . Marital status: Married    Spouse name: Not on file  . Number of children: Not on file  . Years of education: Not on file  . Highest education level: Not on file  Occupational History  . Not on file  Social Needs  . Financial resource strain: Not on file  . Food insecurity    Worry: Not on file    Inability: Not on file  . Transportation needs    Medical: Not on file    Non-medical: Not on file  Tobacco Use  . Smoking status: Never Smoker  . Smokeless tobacco: Never Used  Substance and Sexual Activity  . Alcohol use: Yes    Alcohol/week: 22.0 standard drinks    Types: 10 Standard drinks or equivalent, 12 Cans of beer per week    Comment: daily  . Drug use: No  . Sexual activity: Not on file  Lifestyle  . Physical activity    Days per week: Not on file    Minutes per session: Not on file  . Stress: Not on file  Relationships  . Social Herbalist on phone: Not on file    Gets together: Not on file    Attends religious service: Not on file    Active member of club or organization: Not on file    Attends meetings of clubs or organizations: Not on file    Relationship status: Not on file  Other Topics Concern  . Not on file  Social History Narrative  . Not on file     Family History: The patient's Family history is unknown by patient.  ROS:   Please see the history of present illness.    Some constipation attributed to Diltiazem.  All other systems reviewed and are negative.  EKGs/Labs/Other Studies Reviewed:    The following studies were reviewed today:   EKG:  EKG is ordered today.  The ekg ordered today demonstrates Atrial flutter with variable VR-HR  62  Recent Labs: 10/11/2018: B Natriuretic Peptide 234.9 10/12/2018: ALT 32; TSH 2.524 10/14/2018: BUN 11; Creatinine, Ser 0.91;  Hemoglobin 11.7; Magnesium 1.6; Platelets 163; Potassium 4.1; Sodium 136  Recent Lipid Panel    Component Value Date/Time   CHOL 143 11/08/2016 1116   TRIG 246.0 (H) 11/08/2016 1116   HDL 44.90 11/08/2016 1116   CHOLHDL 3 11/08/2016 1116   VLDL 49.2 (H) 11/08/2016 1116   LDLDIRECT 64.0 11/08/2016 1116    Physical Exam:    VS:  BP 125/71   Pulse 62   Temp (!) 96.8 F (36 C)   Ht 6\' 1"  (1.854 m)   Wt 203 lb (92.1 kg)   SpO2 98%   BMI 26.78 kg/m     Wt Readings from Last 3 Encounters:  10/25/18 203 lb (92.1 kg)  10/14/18 207 lb 4.8 oz (94 kg)  06/06/18 203 lb (92.1 kg)     GEN:  Well nourished, well developed in no acute distress HEENT: Normal NECK: No JVD; No carotid bruits LYMPHATICS: No lymphadenopathy CARDIAC: irregularly irregular, no murmurs, rubs, gallops RESPIRATORY:  Clear to auscultation without rales, wheezing or rhonchi  ABDOMEN: Soft, non-tender, non-distended MUSCULOSKELETAL:  No edema; No deformity  SKIN: Warm and dry NEUROLOGIC:  Alert and oriented x 3 PSYCHIATRIC:  Normal affect   ASSESSMENT:    Atrial fibrillation ([place]) New onset 10/11/2018- unable to do TEE secondary to h/o Zenker's diverticulum with staple in place.  Plan Op DCCV after 3 weeks of OP anticoagulation.  Anticoagulated CHADS VASC=3  PLAN:    OP DCCV. Risks and benefits explained to the patient and he is willing to proceed. Orders have been written. His HR is controlled-62.   I'll have him hold his medications except Eliquis the morning of his procedure, we may be able to stop the Diltiazem or Metoprolol post cardioversion.  He is supposed to have a procedure on his eye in August and I told him he would not be able to stop Eliquis for several weeks post cardioversion.   Medication Adjustments/Labs and Tests Ordered: Current medicines are reviewed at  length with the patient today.  Concerns regarding medicines are outlined above.  No orders of the defined types were placed in this encounter.  No orders of the defined types were placed in this encounter.   There are no Patient Instructions on file for this visit.   Angelena Form, PA-C  10/25/2018 11:25 AM    [facility]

## 2018-10-25 NOTE — Addendum Note (Signed)
Addended by: Newt Minion on: 10/25/2018 12:38 PM   Modules accepted: Orders, SmartSet

## 2018-10-25 NOTE — Patient Instructions (Addendum)
Medication Instructions:  Your physician recommends that you continue on your current medications as directed. Please refer to the Current Medication list given to you today. If you need a refill on your cardiac medications before your next appointment, please call your pharmacy.   Lab work: Your physician recommends that you return for lab work  October 31, 2018 prior to your CARDIOVERSION If you have labs (blood work) drawn today and your tests are completely normal, you will receive your results only by: Marland Kitchen MyChart Message (if you have MyChart) OR . A paper copy in the mail If you have any lab test that is abnormal or we need to change your treatment, we will call you to review the results.  Testing/Procedures: Your physician has recommended that you have a Cardioversion (DCCV). Electrical Cardioversion uses a jolt of electricity to your heart either through paddles or wired patches attached to your chest. This is a controlled, usually prescheduled, procedure. Defibrillation is done under light anesthesia in the hospital, and you usually go home the day of the procedure. This is done to get your heart back into a normal rhythm. You are not awake for the procedure. Please see the instruction sheet given to you today. SEE INSTRUCTIONS BELOW  Follow-Up: At [facility], you and your health needs are our priority.  As part of our continuing mission to provide you with exceptional heart care, we have created designated Provider Care Teams.  These Care Teams include your primary Cardiologist (physician) and Advanced Practice Providers (APPs -  Physician Assistants and Nurse Practitioners) who all work together to provide you with the care you need, when you need it. You will need a follow up appointment in 4 months (November).  Please call our office 2 months in advance to schedule this appointment.  You may see DR Quay Burow or one of the following Advanced Practice Providers on your designated Care  Team:   Kerin Ransom, PA-C Roby Lofts, [place] . Sande Rives, PA-C  Your physician recommends that you schedule a follow-up appointment in: 1 week after Cardioversion with Kerin Ransom, PA-C  Any Other Special Instructions Will Be Listed Below (If Applicable).    Dear [name],  You are scheduled for a Cardioversion. Cardioversion on 11/04/2018 with Dr. Cherlynn Kaiser.  Please arrive at the [facility] (Main Entrance A) at [facility]: [address] [place], [place] [address] at 10:30 am. (1 hour prior to procedure unless lab work is needed; if lab work is needed arrive 1.5 hours ahead)  DIET: Nothing to eat or drink after midnight except a sip of water with medications (see medication instructions below)  Medication Instructions: Continue your anticoagulant: [place] will need to continue your anticoagulant after your procedure until you are told by your  Provider that it is safe to stop.  COVID-19 Test needs to be done on Thursday 10-31-2018   Labs: Come to the lab at [facility] between the hours of 8:00 am and 4:30 pm. You do not have to be fasting.  You must have a responsible person to drive you home and stay in the waiting area during your procedure. Failure to do so could result in cancellation.  Bring your insurance cards.  *Special Note: Every effort is made to have your procedure done on time. Occasionally there are emergencies that occur at the hospital that may cause delays. Please be patient if a  delay does occur.

## 2018-10-31 ENCOUNTER — Other Ambulatory Visit (HOSPITAL_COMMUNITY)
Admission: RE | Admit: 2018-10-31 | Discharge: 2018-10-31 | Disposition: A | Discharge status: Home / Self Care | Payer: Medicare Other | Source: Ambulatory Visit | Attending: Internal Medicine | Admitting: Internal Medicine

## 2018-10-31 DIAGNOSIS — Z20828 Contact with and (suspected) exposure to other viral communicable diseases: Secondary | ICD-10-CM | POA: Diagnosis present

## 2018-11-01 LAB — BASIC METABOLIC PANEL
BUN/Creatinine Ratio: 26 — ABNORMAL HIGH (ref 10–24)
BUN: 25 mg/dL (ref 8–27)
CO2: 25 mmol/L (ref 20–29)
Calcium: 9.5 mg/dL (ref 8.6–10.2)
Chloride: 98 mmol/L (ref 96–106)
Creatinine, Ser: 0.98 mg/dL (ref 0.76–1.27)
GFR calc Af Amer: 88 mL/min/{1.73_m2} (ref >59)
GFR calc non Af Amer: 76 mL/min/{1.73_m2} (ref >59)
Glucose: 132 mg/dL — ABNORMAL HIGH (ref 65–99)
Potassium: 4.1 mmol/L (ref 3.5–5.2)
Sodium: 140 mmol/L (ref 134–144)

## 2018-11-01 LAB — SARS CORONAVIRUS 2 (TAT 6-24 HRS): SARS Coronavirus 2: NEGATIVE

## 2018-11-01 NOTE — Progress Notes (Signed)
Called patient reminded to quarantine through weekend. If have any symptoms or concerns about an exposure, call us Monday am prior to coming.

## 2018-11-04 ENCOUNTER — Other Ambulatory Visit: Payer: Self-pay

## 2018-11-04 ENCOUNTER — Encounter (HOSPITAL_COMMUNITY)
Admission: RE | Disposition: A | Discharge status: Home / Self Care | Payer: Self-pay | Source: Home / Self Care | Attending: Cardiovascular Disease

## 2018-11-04 ENCOUNTER — Ambulatory Visit (HOSPITAL_COMMUNITY): Payer: Medicare Other | Admitting: Certified Registered Nurse Anesthetist

## 2018-11-04 ENCOUNTER — Encounter (HOSPITAL_COMMUNITY): Payer: Self-pay

## 2018-11-04 ENCOUNTER — Ambulatory Visit (HOSPITAL_COMMUNITY)
Admission: RE | Admit: 2018-11-04 | Discharge: 2018-11-04 | Disposition: A | Discharge status: Home / Self Care | Payer: Medicare Other | Attending: Cardiovascular Disease | Admitting: Cardiovascular Disease

## 2018-11-04 DIAGNOSIS — K219 Gastro-esophageal reflux disease without esophagitis: Secondary | ICD-10-CM | POA: Diagnosis not present

## 2018-11-04 DIAGNOSIS — K225 Diverticulum of esophagus, acquired: Secondary | ICD-10-CM | POA: Diagnosis not present

## 2018-11-04 DIAGNOSIS — R600 Localized edema: Secondary | ICD-10-CM | POA: Insufficient documentation

## 2018-11-04 DIAGNOSIS — M109 Gout, unspecified: Secondary | ICD-10-CM | POA: Diagnosis not present

## 2018-11-04 DIAGNOSIS — Z7901 Long term (current) use of anticoagulants: Secondary | ICD-10-CM | POA: Diagnosis not present

## 2018-11-04 DIAGNOSIS — I4819 Other persistent atrial fibrillation: Secondary | ICD-10-CM | POA: Diagnosis not present

## 2018-11-04 DIAGNOSIS — I4891 Unspecified atrial fibrillation: Secondary | ICD-10-CM | POA: Insufficient documentation

## 2018-11-04 DIAGNOSIS — M1990 Unspecified osteoarthritis, unspecified site: Secondary | ICD-10-CM | POA: Diagnosis not present

## 2018-11-04 DIAGNOSIS — Z79899 Other long term (current) drug therapy: Secondary | ICD-10-CM | POA: Insufficient documentation

## 2018-11-04 HISTORY — PX: CARDIOVERSION: SHX1299

## 2018-11-04 LAB — POCT I-STAT, CHEM 8
BUN: 29 mg/dL — ABNORMAL HIGH (ref 8–23)
Calcium, Ion: 1.17 mmol/L (ref 1.15–1.40)
Chloride: 100 mmol/L (ref 98–111)
Creatinine, Ser: 0.8 mg/dL (ref 0.61–1.24)
Glucose, Bld: 107 mg/dL — ABNORMAL HIGH (ref 70–99)
HCT: 36 % — ABNORMAL LOW (ref 39.0–52.0)
Hemoglobin: 12.2 g/dL — ABNORMAL LOW (ref 13.0–17.0)
Potassium: 4.1 mmol/L (ref 3.5–5.1)
Sodium: 139 mmol/L (ref 135–145)
TCO2: 30 mmol/L (ref 22–32)

## 2018-11-04 SURGERY — CARDIOVERSION
Anesthesia: General

## 2018-11-04 MED ORDER — SODIUM CHLORIDE 0.9% FLUSH: 3.0000 mL | INTRAVENOUS | Status: DC | PRN

## 2018-11-04 MED ORDER — SODIUM CHLORIDE 0.9 % IV SOLN
INTRAVENOUS | Status: DC | PRN
  Administered 2018-11-04: 11:00:00 via INTRAVENOUS

## 2018-11-04 MED ORDER — PROPOFOL 10 MG/ML IV BOLUS
INTRAVENOUS | Status: DC | PRN
  Administered 2018-11-04: 20 mg via INTRAVENOUS
  Administered 2018-11-04: 50 mg via INTRAVENOUS
  Administered 2018-11-04: 20 mg via INTRAVENOUS
  Administered 2018-11-04: 30 mg via INTRAVENOUS
  Administered 2018-11-04: 10 mg via INTRAVENOUS

## 2018-11-04 NOTE — Transfer of Care (Signed)
Immediate Anesthesia Transfer of Care Note  Patient: [name]  Procedure(s) Performed: CARDIOVERSION (N/A )  Patient Location: Endoscopy Unit  Anesthesia Type:General  Level of Consciousness: awake, alert  and oriented  Airway & Oxygen Therapy: Patient Spontanous Breathing  Post-op Assessment: Report given to RN, Post -op Vital signs reviewed and stable and Patient moving all extremities X 4  Post vital signs: Reviewed and stable  Last Vitals:  Vitals Value Taken Time  BP    Temp    Pulse    Resp    SpO2      Last Pain:  Vitals:   11/04/18 1059  TempSrc: Oral  PainSc: 4          Complications: No apparent anesthesia complications

## 2018-11-04 NOTE — Interval H&P Note (Signed)
History and Physical Interval Note:  11/04/2018 10:43 AM  [name]  has presented today for surgery, with the diagnosis of A-FLUTTER.  The various methods of treatment have been discussed with the patient and family. After consideration of risks, benefits and other options for treatment, the patient has consented to  Procedure(s): CARDIOVERSION (N/A) as a surgical intervention.  The patient's history has been reviewed, patient examined, no change in status, stable for surgery.  I have reviewed the patient's chart and labs.  Questions were answered to the patient's satisfaction.     Mertie Moores

## 2018-11-04 NOTE — CV Procedure (Signed)
    Cardioversion Note  [name] [id number] Sep 07, 1945  Procedure: DC Cardioversion Indications: Atrial fib   Procedure Details Consent: Obtained Time Out: Verified patient identification, verified procedure, site/side was marked, verified correct patient position, special equipment/implants available, Radiology Safety Procedures followed,  medications/allergies/relevent history reviewed, required imaging and test results available.  Performed  The patient has been on adequate anticoagulation.  The patient received IV  Propofol 130 mg IV  for sedation.  Synchronous cardioversion was performed at 200  joules.  The cardioversion was successful    Complications: No apparent complications Patient did tolerate procedure well.   Thayer Headings, Brooke Bonito., MD, [facility] 11/04/2018, 11:44 AM

## 2018-11-04 NOTE — Anesthesia Procedure Notes (Signed)
Procedure Name: General with mask airway Date/Time: 11/04/2018 11:29 AM Performed by: Harden Mo, CRNA Pre-anesthesia Checklist: Patient identified, Emergency Drugs available, Suction available and Patient being monitored Patient Re-evaluated:Patient Re-evaluated prior to induction Oxygen Delivery Method: Ambu bag Preoxygenation: Pre-oxygenation with 100% oxygen Induction Type: IV induction Placement Confirmation: positive ETCO2 and breath sounds checked- equal and bilateral Dental Injury: Teeth and Oropharynx as per pre-operative assessment

## 2018-11-05 ENCOUNTER — Encounter (HOSPITAL_COMMUNITY): Payer: Self-pay | Admitting: Cardiovascular Disease

## 2018-11-05 NOTE — Anesthesia Postprocedure Evaluation (Signed)
Anesthesia Post Note  Patient: [name]  Procedure(s) Performed: CARDIOVERSION (N/A )     Patient location during evaluation: Endoscopy Anesthesia Type: General Level of consciousness: awake and alert Pain management: pain level controlled Vital Signs Assessment: post-procedure vital signs reviewed and stable Respiratory status: spontaneous breathing, nonlabored ventilation, respiratory function stable and patient connected to nasal cannula oxygen Cardiovascular status: stable Postop Assessment: no apparent nausea or vomiting Anesthetic complications: no    Last Vitals:  Vitals:   11/04/18 1141 11/04/18 1150  BP: 110/64 102/63  Pulse: 63 62  Resp: 18 13  Temp: 36.4 C   SpO2: 98% 100%    Last Pain:  Vitals:   11/04/18 1141  TempSrc:   PainSc: 0-No pain                 Marlow Hendrie

## 2018-11-05 NOTE — Anesthesia Preprocedure Evaluation (Signed)
Anesthesia Evaluation  Patient identified by MRN, date of birth, ID band Patient awake    Reviewed: Allergy & Precautions, NPO status , Patient's Chart, lab work & pertinent test results  History of Anesthesia Complications Negative for: history of anesthetic complications  Airway Mallampati: II  TM Distance: >3 FB Neck ROM: Full    Dental  (+) Dental Advisory Given   Pulmonary neg pulmonary ROS,    breath sounds clear to auscultation       Cardiovascular + dysrhythmias Atrial Fibrillation  Rhythm:Irregular     Neuro/Psych PSYCHIATRIC DISORDERS Depression negative neurological ROS     GI/Hepatic GERD  ,(+)     substance abuse  alcohol use,   Endo/Other  negative endocrine ROS  Renal/GU negative Renal ROS  negative genitourinary   Musculoskeletal  (+) Arthritis  (gout), narcotic dependent  Abdominal   Peds  Hematology  (+) Blood dyscrasia, anemia ,   Anesthesia Other Findings Echo 10/12/18: EF 55-60%, RVSP 36, no significant valve findings  Reproductive/Obstetrics                             Anesthesia Physical Anesthesia Plan  ASA: III  Anesthesia Plan: General   Post-op Pain Management:    Induction: Intravenous  PONV Risk Score and Plan: 2 and Treatment may vary due to age or medical condition  Airway Management Planned: Mask  Additional Equipment: None  Intra-op Plan:   Post-operative Plan: Extubation in OR  Informed Consent: I have reviewed the patients History and Physical, chart, labs and discussed the procedure including the risks, benefits and alternatives for the proposed anesthesia with the patient or authorized representative who has indicated his/her understanding and acceptance.     Dental advisory given  Plan Discussed with: CRNA and Surgeon  Anesthesia Plan Comments:         Anesthesia Quick Evaluation

## 2018-11-14 ENCOUNTER — Other Ambulatory Visit: Payer: Self-pay

## 2018-11-14 ENCOUNTER — Ambulatory Visit (INDEPENDENT_AMBULATORY_CARE_PROVIDER_SITE_OTHER): Payer: Medicare Other | Admitting: General Practice

## 2018-11-14 ENCOUNTER — Encounter: Payer: Self-pay | Admitting: Cardiology

## 2018-11-14 VITALS — BP 112/62 | HR 67 | Temp 97.4°F | Ht 73.0 in | Wt 211.2 lb

## 2018-11-14 DIAGNOSIS — I4891 Unspecified atrial fibrillation: Secondary | ICD-10-CM | POA: Diagnosis not present

## 2018-11-14 DIAGNOSIS — I5043 Acute on chronic combined systolic (congestive) and diastolic (congestive) heart failure: Secondary | ICD-10-CM

## 2018-11-14 DIAGNOSIS — Z862 Personal history of diseases of the blood and blood-forming organs and certain disorders involving the immune mechanism: Secondary | ICD-10-CM

## 2018-11-14 MED ORDER — METOPROLOL TARTRATE 25 MG PO TABS: 25.0000 mg | ORAL_TABLET | Freq: Two times a day (BID) | ORAL | 0 refills | Status: DC

## 2018-11-14 MED ORDER — POTASSIUM CHLORIDE CRYS ER 10 MEQ PO TBCR: 10.0000 meq | EXTENDED_RELEASE_TABLET | Freq: Every day | ORAL | 1 refills | Status: DC

## 2018-11-14 MED ORDER — DILTIAZEM HCL ER COATED BEADS 120 MG PO CP24: 240.0000 mg | ORAL_CAPSULE | Freq: Every day | ORAL | 1 refills | Status: DC

## 2018-11-14 MED ORDER — METOPROLOL TARTRATE 25 MG PO TABS: 25.0000 mg | ORAL_TABLET | Freq: Two times a day (BID) | ORAL | 2 refills | Status: DC

## 2018-11-14 MED ORDER — APIXABAN 5 MG PO TABS: 5.0000 mg | ORAL_TABLET | Freq: Two times a day (BID) | ORAL | 0 refills | Status: DC

## 2018-11-14 MED ORDER — POTASSIUM CHLORIDE CRYS ER 10 MEQ PO TBCR: 10.0000 meq | EXTENDED_RELEASE_TABLET | Freq: Every day | ORAL | 0 refills | Status: DC

## 2018-11-14 MED ORDER — FUROSEMIDE 40 MG PO TABS: 40.0000 mg | ORAL_TABLET | Freq: Every day | ORAL | 3 refills | Status: DC

## 2018-11-14 MED ORDER — APIXABAN 5 MG PO TABS: 5.0000 mg | ORAL_TABLET | Freq: Two times a day (BID) | ORAL | 3 refills | Status: DC

## 2018-11-14 MED ORDER — FUROSEMIDE 40 MG PO TABS: 40.0000 mg | ORAL_TABLET | Freq: Every day | ORAL | 0 refills | Status: DC

## 2018-11-14 NOTE — Progress Notes (Signed)
Cardiology Clinic Note   Patient Name: [name] Date of Encounter: 11/14/2018  Primary Care Provider:  Patient, No Pcp Per Primary Cardiologist:  Quay Burow, MD  Patient Profile    [name]. [name] 73 year old male presents today for follow-up status post successful DCCV on 11/04/2018.  Past Medical History    Past Medical History:  Diagnosis Date  . Atrial fibrillation with rapid ventricular response ([place]) 10/25/2018   Successful  DCCV shock x1 200 J 11/04/2018  . Chronic back pain    buldging disc  . Depression   . GERD (gastroesophageal reflux disease)   . GOUT    takes Allopurinol daily and Colchicine as needed  . History of bronchitis    many yrs ago  . History of colonoscopy    benign  . History of shingles   . Joint pain   . Joint swelling   . Muscle spasm    takes Robaxin as needed  . Nocturia   . OSTEOARTHRITIS   . Peripheral edema    takes Lasix daily as needed  . Pneumonia 1969  . Seasonal allergies    takes Zyrtec and uses Flonase is needed  . TESTICULAR HYPOFUNCTION   . Zenker diverticula    Past Surgical History:  Procedure Laterality Date  . ANKLE SURGERY     4 times  . CARDIOVERSION N/A 11/04/2018   Procedure: CARDIOVERSION;  Surgeon: Acie Fredrickson Wonda Cheng, MD;  Location: [facility];  Service: Cardiovascular;  Laterality: N/A;  . COLONOSCOPY    . ELBOW SURGERY Right    torn tendon  . INGUINAL HERNIA REPAIR     right  . KNEE SURGERY     3 on each knee  . SHOULDER ARTHROSCOPY Right 06/15/2015   Procedure: RIGHT SHOULDER ARTHROSCOPY, ACROMIOPLASTY, DEBRIDEMENT;  Surgeon: Melrose Nakayama, MD;  Location: [facility];  Service: Orthopedics;  Laterality: Right;  . shoulder arthrosopy Left   . TONSILLECTOMY AND ADENOIDECTOMY    . TUMOR EXCISION     sebaceous cyst removed from chest  . ZENKER'S DIVERTICULECTOMY ENDOSCOPIC N/A 06/14/2016   Procedure: Stanford Breed DIVERTICULECTOMY ENDOSCOPIC;  Surgeon: Izora Gala, MD;  Location: [facility];  Service: ENT;   Laterality: N/A;    Allergies  No Known Allergies  History of Present Illness    Mr. [name] was last seen by David Ransom, PA-C on 10/25/2018.  [facility] for lower extremity edema, fatigue, and shortness of breath.  He was found to be in A. fib with RVR and had a normal TSH.  He was given diltiazem to help manage his heart rate, and IV Lasix for his edema.  He diuresed between 4 and 5 pounds.  He was also scheduled for TEE but has a history of Zenker's diverticulum with prior stapling and there was still a staple in place.  He was doing well at that time and did not complain of edema or shortness of breath.  A DCCV was scheduled, he presented to the hospital on 11/04/2018 received shock x1 with 200 J and was successfully cardioverted.  His PMH also includes chronic venous insufficiency, acute on chronic combined systolic and diastolic heart failure, allergic rhinitis, pulmonary nodule left lower lobe, Zenker's diverticulum, osteoarthritis, gout, low back pain, generalized weakness,  vitamin B12 deficiency, and macrocytic anemia.  He presents to the clinic today and states he is feeling better.  He feels like he has more energy and is ready to start exercising again.  He states he is typically very physically active walking and lifting weights.  He goes on to say that his exercise intolerance is why he felt he needed to be seen.  He does not feel palpitations when he is in atrial fibrillation. He continues to have LEE and wants to know why it is there.   He denies chest pain, shortness of breath, fatigue, palpitations, melena, hematuria, hemoptysis, diaphoresis, weakness, presyncope, syncope, orthopnea, and PND.   Home Medications    Prior to Admission medications   Medication Sig Start Date End Date Taking? Authorizing Provider  allopurinol (ZYLOPRIM) 100 MG tablet Take 5 tablets (500 mg total) by mouth daily. Patient taking differently: Take 300 mg by mouth  daily.  01/19/14   Marletta Lor, MD  apixaban (ELIQUIS) 5 MG TABS tablet Take 1 tablet (5 mg total) by mouth 2 (two) times daily. 10/14/18 11/13/18  Guilford Shi, MD  CALCIUM 600 1500 (600 Ca) MG TABS tablet Take 600 mg of elemental calcium by mouth 2 (two) times daily.  02/16/16   [provider]  capsaicin (ZOSTRIX) 0.025 % cream Apply 1 application topically 2 (two) times daily as needed (pain.).    [provider]  cetirizine (ZYRTEC) 10 MG tablet Take 10 mg by mouth daily as needed for allergies.    [provider]  cyanocobalamin 2000 MCG tablet Take 2,000 mcg by mouth daily.    [provider]  diltiazem (CARDIZEM CD) 240 MG 24 hr capsule Take 1 capsule (240 mg total) by mouth daily. 10/15/18   Guilford Shi, MD  famotidine (PEPCID) 20 MG tablet Take 20 mg by mouth 2 (two) times a day.    [provider]  folic acid (FOLVITE) 1 MG tablet Take 1 tablet (1 mg total) by mouth daily. 10/15/18   Guilford Shi, MD  furosemide (LASIX) 40 MG tablet Take 1 tablet (40 mg total) by mouth daily. Hold foe SBP <100 10/14/18   Guilford Shi, MD  hydroxypropyl methylcellulose / hypromellose (ISOPTO TEARS / GONIOVISC) 2.5 % ophthalmic solution Place 1-2 drops into both eyes 3 (three) times daily as needed (dry/irritated eyes.).    [provider]  ipratropium (ATROVENT) 0.03 % nasal spray Place 1 spray into the nose daily as needed for rhinitis.  06/23/16   [provider]  magnesium oxide (MAG-OX) 400 (241.3 Mg) MG tablet Take 400 mg by mouth daily.  07/19/17   [provider]  metoprolol tartrate (LOPRESSOR) 25 MG tablet Take 1 tablet (25 mg total) by mouth 2 (two) times daily. Hold SYSTOLIC BP <097, HR <35 06/30/90   Guilford Shi, MD  morphine (MS CONTIN) 15 MG 12 hr tablet Take 15 mg by mouth every 12 (twelve) hours.    [provider]  Multiple Vitamin (MULTIVITAMIN) tablet Take 1 tablet by mouth daily.     [provider]  oxyCODONE (OXY IR/ROXICODONE) 5 MG immediate release tablet Take 1 tablet (5 mg total) by mouth every 4 (four) hours as needed for severe pain. In addition to current chronic pain medication Patient taking differently: Take 5 mg by mouth every 4 (four) hours.  06/15/15   Loni Dolly, PA-C  potassium chloride SA (K-DUR) 10 MEQ tablet Take 1 tablet (10 mEq total) by mouth daily. 10/15/18   Guilford Shi, MD  sildenafil (VIAGRA) 100 MG tablet Take 100 mg by mouth daily as needed for erectile dysfunction.    [provider]  thiamine 100 MG tablet Take 1 tablet (100  mg total) by mouth daily. 10/15/18   Guilford Shi, MD  TURMERIC PO Take 1 capsule by mouth daily.    [provider]    Family History    Family History  Family history unknown: Yes   He indicated that his mother is deceased. He indicated that his father is deceased.  Social History    Social History   Socioeconomic History  . Marital status: Married    Spouse name: Not on file  . Number of children: Not on file  . Years of education: Not on file  . Highest education level: Not on file  Occupational History  . Not on file  Social Needs  . Financial resource strain: Not on file  . Food insecurity    Worry: Not on file    Inability: Not on file  . Transportation needs    Medical: Not on file    Non-medical: Not on file  Tobacco Use  . Smoking status: Never Smoker  . Smokeless tobacco: Never Used  Substance and Sexual Activity  . Alcohol use: Yes    Alcohol/week: 22.0 standard drinks    Types: 10 Standard drinks or equivalent, 12 Cans of beer per week    Comment: daily  . Drug use: No  . Sexual activity: Not on file  Lifestyle  . Physical activity    Days per week: Not on file    Minutes per session: Not on file  . Stress: Not on file  Relationships  . Social Herbalist on phone: Not on file    Gets together: Not on file    Attends religious  service: Not on file    Active member of club or organization: Not on file    Attends meetings of clubs or organizations: Not on file    Relationship status: Not on file  . Intimate partner violence    Fear of current or ex partner: Not on file    Emotionally abused: Not on file    Physically abused: Not on file    Forced sexual activity: Not on file  Other Topics Concern  . Not on file  Social History Narrative  . Not on file     Review of Systems    General:  No chills, fever, night sweats or weight changes.  Cardiovascular:  No chest pain, dyspnea on exertion, edema, orthopnea, palpitations, paroxysmal nocturnal dyspnea. Dermatological: No rash, lesions/masses Respiratory: No cough, dyspnea Urologic: No hematuria, dysuria Abdominal:   No nausea, vomiting, diarrhea, bright red blood per rectum, melena, or hematemesis Neurologic:  No visual changes, wkns, changes in mental status. All other systems reviewed and are otherwise negative except as noted above.  Physical Exam    VS:  BP 112/62   Pulse 67   Temp (!) 97.4 F (36.3 C) (Temporal)   Ht 6\' 1"  (1.854 m)   Wt 221 lb 3.2 oz (100.3 kg)   BMI 29.18 kg/m  , BMI Body mass index is 29.18 kg/m. GEN: Well nourished, well developed, in no acute distress. HEENT: normal. Neck: Supple, no JVD, carotid bruits, or masses. Cardiac: RRR, no murmurs, rubs, or gallops. No clubbing, cyanosis, +2 bilateral lower extremity pitting edema.  Radials/DP/PT 2+ and equal bilaterally.   Respiratory:  Respirations regular and unlabored, clear to auscultation bilaterally. GI: Soft, nontender, nondistended, BS + x 4. MS: no deformity or atrophy. Skin: warm and dry, no rash. Neuro:  Strength and sensation are intact. Psych: Normal affect.  Accessory Clinical Findings    ECG personally reviewed by me today-normal sinus rhythm 63 bpm- No acute changes  EKG 11/04/2018: Sinus bradycardia 56 bpm  EKG 10/25/2018 Atrial flutter 4:1 AV conduction  with incomplete right bundle branch block 62 bpm  EKG 10/15/2018 Atrial fibrillation 146 bpm  EKG 10/14/2018 Atrial flutter 4: 1 AV conduction 67 bpm  Echocardiogram 10/12/2018 1. The left ventricle has normal systolic function, with an ejection fraction of 55-60%. The cavity size was normal. There is mildly increased left ventricular wall thickness. Left ventricular diastolic Doppler parameters are consistent with impaired  relaxation.  2. The right ventricle has normal systolic function. The cavity was normal. There is no increase in right ventricular wall thickness. Right ventricular systolic pressure is mildly elevated with an estimated pressure of 36.0 mmHg.  3. Left atrial size was mildly dilated.  4. There is mild mitral annular calcification present.  5. The aortic valve is tricuspid. Mild thickening of the aortic valve. Mild calcification of the aortic valve. Aortic valve regurgitation is trivial by color flow Doppler.  6. The inferior vena cava was dilated in size with <50% respiratory variability. Assessment & Plan   1.  Atrial fibrillation with rapid ventricular response- normal sinus rhythm today 63 bpm Continue apixaban 5 mg tablet twice daily Continue diltiazem120 mg tablet daily-lower extremity edema Continue Lasix 40 mg tablet daily hold for SBP less than 100 Continue metoprolol tartrate 25 mg tablet Twice daily Continue potassium chloride 10 mEq tablet daily Order BMP CHA2DS2/VAS Stroke Risk Points 2     2.  Acute on chronic combined systolic and diastolic CHF- echocardiogram LVEF 55 to 96%, grade 1 diastolic dysfunction, Right ventricular systolic pressure is mildly elevated with an estimated pressure of 36.0 mmHg. Left atrial size was mildly dilated.  Still bilateral +2 pitting edema, denies SOB and orthopnea. Continue diltiazem 120 mg tablet daily Continue Lasix 40 mg tablet daily hold for SBP less than 100 Continue metoprolol tartrate 25 mg tablet Twice daily  Continue potassium chloride 10 mEq tablet daily Daily weights Low-sodium heart healthy diet  3.  History of anemia- hemoglobin 10/14/2018 11.7, 11/14/2018 12.2 Order CBC  Disposition follow-up in 2 weeks with APP.  Deberah Pelton, NP 11/14/2018, 2:09 PM

## 2018-11-14 NOTE — Patient Instructions (Signed)
Medication Instructions:  STOP Dilitazem 320mg  START Diltiazem 120mg  Take 1 tablet once a day  CONTINUE TAKING METOPROLOL, POTASSIUM, LASIX AND ELIQUIS BECAUSE THIS HELPS YOU STAY IN SINUS RHYTHM If you need a refill on your cardiac medications before your next appointment, please call your pharmacy.   Lab work: Your physician recommends that you return for lab work in: TODAY-BMET If you have labs (blood work) drawn today and your tests are completely normal, you will receive your results only by:  [place] (if you have MyChart) OR  A paper copy in the mail If you have any lab test that is abnormal or we need to change your treatment, we will call you to review the results.  Testing/Procedures: NONE   Follow-Up: At [facility], you and your health needs are our priority.  As part of our continuing mission to provide you with exceptional heart care, we have created designated Provider Care Teams.  These Care Teams include your primary Cardiologist (physician) and Advanced Practice Providers (APPs -  Physician Assistants and Nurse Practitioners) who all work together to provide you with the care you need, when you need it.   Your physician recommends that you schedule a follow-up appointment in: 2 [place], NP  Any Other Special Instructions Will Be Listed Below (If Applicable). CHECK YOUR WEIGHT DAILY EAT A LOW SODIUM DIET      Low-Sodium Eating Plan Sodium, which is an element that makes up salt, helps you maintain a healthy balance of fluids in your body. Too much sodium can increase your blood pressure and cause fluid and waste to be held in your body. Your health care provider or dietitian may recommend following this plan if you have high blood pressure (hypertension), kidney disease, liver disease, or heart failure. Eating less sodium can help lower your blood pressure, reduce swelling, and protect your heart, liver, and kidneys. What are tips for  following this plan? General guidelines  Most people on this plan should limit their sodium intake to 1,500-2,000 mg (milligrams) of sodium each day. Reading food labels   The Nutrition Facts label lists the amount of sodium in one serving of the food. If you eat more than one serving, you must multiply the listed amount of sodium by the number of servings.  Choose foods with less than 140 mg of sodium per serving.  Avoid foods with 300 mg of sodium or more per serving. Shopping  Look for lower-sodium products, often labeled as "low-sodium" or "no salt added."  Always check the sodium content even if foods are labeled as "unsalted" or "no salt added".  Buy fresh foods. ? Avoid canned foods and premade or frozen meals. ? Avoid canned, cured, or processed meats  Buy breads that have less than 80 mg of sodium per slice. Cooking  Eat more home-cooked food and less restaurant, buffet, and fast food.  Avoid adding salt when cooking. Use salt-free seasonings or herbs instead of table salt or sea salt. Check with your health care provider or pharmacist before using salt substitutes.  Cook with plant-based oils, such as canola, sunflower, or olive oil. Meal planning  When eating at a restaurant, ask that your food be prepared with less salt or no salt, if possible.  Avoid foods that contain MSG (monosodium glutamate). MSG is sometimes added to [place] food, bouillon, and some canned foods. What foods are recommended? The items listed may not be a complete list. Talk with your dietitian about what dietary  choices are best for you. Grains Low-sodium cereals, including oats, puffed wheat and rice, and shredded wheat. Low-sodium crackers. Unsalted rice. Unsalted pasta. Low-sodium bread. Whole-grain breads and whole-grain pasta. Vegetables Fresh or frozen vegetables. "No salt added" canned vegetables. "No salt added" tomato sauce and paste. Low-sodium or reduced-sodium tomato and vegetable  juice. Fruits Fresh, frozen, or canned fruit. Fruit juice. Meats and other protein foods Fresh or frozen (no salt added) meat, poultry, seafood, and fish. Low-sodium canned tuna and salmon. Unsalted nuts. Dried peas, beans, and lentils without added salt. Unsalted canned beans. Eggs. Unsalted nut butters. Dairy Milk. Soy milk. Cheese that is naturally low in sodium, such as ricotta cheese, fresh mozzarella, or Swiss cheese Low-sodium or reduced-sodium cheese. Cream cheese. Yogurt. Fats and oils Unsalted butter. Unsalted margarine with no trans fat. Vegetable oils such as canola or olive oils. Seasonings and other foods Fresh and dried herbs and spices. Salt-free seasonings. Low-sodium mustard and ketchup. Sodium-free salad dressing. Sodium-free light mayonnaise. Fresh or refrigerated horseradish. Lemon juice. Vinegar. Homemade, reduced-sodium, or low-sodium soups. Unsalted popcorn and pretzels. Low-salt or salt-free chips. What foods are not recommended? The items listed may not be a complete list. Talk with your dietitian about what dietary choices are best for you. Grains Instant hot cereals. Bread stuffing, pancake, and biscuit mixes. Croutons. Seasoned rice or pasta mixes. Noodle soup cups. Boxed or frozen macaroni and cheese. Regular salted crackers. Self-rising flour. Vegetables Sauerkraut, pickled vegetables, and relishes. Olives. [place] fries. Onion rings. Regular canned vegetables (not low-sodium or reduced-sodium). Regular canned tomato sauce and paste (not low-sodium or reduced-sodium). Regular tomato and vegetable juice (not low-sodium or reduced-sodium). Frozen vegetables in sauces. Meats and other protein foods Meat or fish that is salted, canned, smoked, spiced, or pickled. Bacon, ham, sausage, hotdogs, corned beef, chipped beef, packaged lunch meats, salt pork, jerky, pickled herring, anchovies, regular canned tuna, sardines, salted nuts. Dairy Processed cheese and cheese spreads.  Cheese curds. Blue cheese. Feta cheese. String cheese. Regular cottage cheese. Buttermilk. Canned milk. Fats and oils Salted butter. Regular margarine. Ghee. Bacon fat. Seasonings and other foods Onion salt, garlic salt, seasoned salt, table salt, and sea salt. Canned and packaged gravies. Worcestershire sauce. Tartar sauce. Barbecue sauce. Teriyaki sauce. Soy sauce, including reduced-sodium. Steak sauce. Fish sauce. Oyster sauce. Cocktail sauce. Horseradish that you find on the shelf. Regular ketchup and mustard. Meat flavorings and tenderizers. Bouillon cubes. Hot sauce and Tabasco sauce. Premade or packaged marinades. Premade or packaged taco seasonings. Relishes. Regular salad dressings. Salsa. Potato and tortilla chips. Corn chips and puffs. Salted popcorn and pretzels. Canned or dried soups. Pizza. Frozen entrees and pot pies. Summary  Eating less sodium can help lower your blood pressure, reduce swelling, and protect your heart, liver, and kidneys.  Most people on this plan should limit their sodium intake to 1,500-2,000 mg (milligrams) of sodium each day.  Canned, boxed, and frozen foods are high in sodium. Restaurant foods, fast foods, and pizza are also very high in sodium. You also get sodium by adding salt to food.  Try to cook at home, eat more fresh fruits and vegetables, and eat less fast food, canned, processed, or prepared foods. This information is not intended to replace advice given to you by your health care provider. Make sure you discuss any questions you have with your health care provider. Document Released: 09/09/2001 Document Revised: 03/02/2017 Document Reviewed: 03/13/2016 Elsevier Patient Education  2020 [employer].

## 2018-11-14 NOTE — Addendum Note (Signed)
Addended by: Ulice Brilliant T on: 11/14/2018 02:41 PM   Modules accepted: Orders

## 2018-11-15 LAB — BASIC METABOLIC PANEL
BUN/Creatinine Ratio: 30 — ABNORMAL HIGH (ref 10–24)
BUN: 25 mg/dL (ref 8–27)
CO2: 27 mmol/L (ref 20–29)
Calcium: 8.9 mg/dL (ref 8.6–10.2)
Chloride: 100 mmol/L (ref 96–106)
Creatinine, Ser: 0.83 mg/dL (ref 0.76–1.27)
GFR calc Af Amer: 101 mL/min/{1.73_m2} (ref >59)
GFR calc non Af Amer: 87 mL/min/{1.73_m2} (ref >59)
Glucose: 97 mg/dL (ref 65–99)
Potassium: 4.6 mmol/L (ref 3.5–5.2)
Sodium: 141 mmol/L (ref 134–144)

## 2018-11-28 ENCOUNTER — Telehealth: Payer: Self-pay | Admitting: General Practice

## 2018-11-28 ENCOUNTER — Encounter: Payer: Self-pay | Admitting: General Practice

## 2018-11-28 ENCOUNTER — Ambulatory Visit (INDEPENDENT_AMBULATORY_CARE_PROVIDER_SITE_OTHER): Payer: Medicare Other | Admitting: General Practice

## 2018-11-28 ENCOUNTER — Other Ambulatory Visit: Payer: Self-pay

## 2018-11-28 VITALS — BP 138/78 | HR 66 | Temp 97.0°F | Ht 73.0 in | Wt 204.0 lb

## 2018-11-28 DIAGNOSIS — I5043 Acute on chronic combined systolic (congestive) and diastolic (congestive) heart failure: Secondary | ICD-10-CM

## 2018-11-28 DIAGNOSIS — I480 Paroxysmal atrial fibrillation: Secondary | ICD-10-CM | POA: Diagnosis not present

## 2018-11-28 MED ORDER — APIXABAN 5 MG PO TABS: 5.0000 mg | ORAL_TABLET | Freq: Two times a day (BID) | ORAL | 3 refills | Status: DC

## 2018-11-28 MED ORDER — METOPROLOL TARTRATE 25 MG PO TABS: 25.0000 mg | ORAL_TABLET | Freq: Two times a day (BID) | ORAL | 0 refills | Status: AC

## 2018-11-28 MED ORDER — DILTIAZEM HCL ER COATED BEADS 120 MG PO CP24: 120.0000 mg | ORAL_CAPSULE | Freq: Every day | ORAL | 1 refills | Status: DC | PRN

## 2018-11-28 MED ORDER — FUROSEMIDE 20 MG PO TABS: 20.0000 mg | ORAL_TABLET | Freq: Every day | ORAL | 1 refills | Status: DC

## 2018-11-28 NOTE — Telephone Encounter (Signed)
Will forward message to Coletta Memos NP for review ./cy

## 2018-11-28 NOTE — Progress Notes (Signed)
Cardiology Clinic Note   Patient Name: [name] Date of Encounter: 11/28/2018  Primary Care Provider:  Patient, No Pcp Per Primary Cardiologist:  David Burow, MD  Patient Profile    [name] 73 year old male presents today for follow-up of his chronic systolic and diastolic CHF s/p DCCV on 123XX123.  Past Medical History    Past Medical History:  Diagnosis Date   Atrial fibrillation with rapid ventricular response ([place]) 10/25/2018   Successful  DCCV shock x1 200 J 11/04/2018   Chronic back pain    buldging disc   Depression    GERD (gastroesophageal reflux disease)    GOUT    takes Allopurinol daily and Colchicine as needed   History of bronchitis    many yrs ago   History of colonoscopy    benign   History of shingles    Joint pain    Joint swelling    Muscle spasm    takes Robaxin as needed   Nocturia    OSTEOARTHRITIS    Peripheral edema    takes Lasix daily as needed   Pneumonia 1969   Seasonal allergies    takes Zyrtec and uses Flonase is needed   TESTICULAR HYPOFUNCTION    Zenker diverticula    Past Surgical History:  Procedure Laterality Date   ANKLE SURGERY     4 times   CARDIOVERSION N/A 11/04/2018   Procedure: CARDIOVERSION;  Surgeon: Nahser, Wonda Cheng, MD;  Location: [facility];  Service: Cardiovascular;  Laterality: N/A;   COLONOSCOPY     ELBOW SURGERY Right    torn tendon   INGUINAL HERNIA REPAIR     right   KNEE SURGERY     3 on each knee   SHOULDER ARTHROSCOPY Right 06/15/2015   Procedure: RIGHT SHOULDER ARTHROSCOPY, ACROMIOPLASTY, DEBRIDEMENT;  Surgeon: David Nakayama, MD;  Location: [place];  Service: Orthopedics;  Laterality: Right;   shoulder arthrosopy Left    TONSILLECTOMY AND ADENOIDECTOMY     TUMOR EXCISION     sebaceous cyst removed from chest   ZENKER'S DIVERTICULECTOMY ENDOSCOPIC N/A 06/14/2016   Procedure: ZENKER'S DIVERTICULECTOMY ENDOSCOPIC;  Surgeon: David Gala, MD;  Location:  [place];  Service: ENT;  Laterality: N/A;    Allergies  No Known Allergies  History of Present Illness    [name] was last seen on 11/14/2018.  He underwent successful DCCV on 11/04/2018.  He received 1 shock with 200 J.  During his 11/14/2018 clinic visit he was still in sinus rhythm however, he maintained his +2 bilateral pitting lower extremity edema and was kept on diuresis with potassium supplementation.  His PMH also includes chronic venous insufficiency, acute on chronic combined systolic and diastolic heart failure, allergic rhinitis, pulmonary nodule left lower lobe, Zenker's diverticulum, osteoarthritis, gout, low back pain, generalized weakness,  vitamin B12 deficiency, and macrocytic anemia.  He presents to the clinic today and states that he feels well and he has started lifting light weights again daily.  He feels like he has more energy and has noticed that his legs are less swollen.  He states he is not able to walk for long distances due to his right and left knee pain.  He also indicates that he has stopped taking his diltiazem because he thought he did not need to take it any longer.  He denies chest pain, shortness of breath, lower extremity edema, fatigue, palpitations, melena, hematuria, hemoptysis, diaphoresis, weakness, presyncope, syncope, orthopnea, and PND.   Home Medications  Prior to Admission medications   Medication Sig Start Date End Date Taking? Authorizing Provider  allopurinol (ZYLOPRIM) 100 MG tablet Take 5 tablets (500 mg total) by mouth daily. Patient taking differently: Take 300 mg by mouth daily.  01/19/14   Marletta Lor, MD  apixaban (ELIQUIS) 5 MG TABS tablet Take 1 tablet (5 mg total) by mouth 2 (two) times daily. 11/14/18 11/09/19  Deberah Pelton, NP  CALCIUM 600 1500 (600 Ca) MG TABS tablet Take 600 mg of elemental calcium by mouth 2 (two) times daily.  02/16/16   [provider]  capsaicin (ZOSTRIX) 0.025 % cream Apply 1  application topically 2 (two) times daily as needed (pain.).    [provider]  cyanocobalamin 2000 MCG tablet Take 2,000 mcg by mouth daily.    [provider]  diltiazem (CARDIZEM CD) 120 MG 24 hr capsule Take 2 capsules (240 mg total) by mouth daily. 11/14/18   Deberah Pelton, NP  famotidine (PEPCID) 20 MG tablet Take 20 mg by mouth 2 (two) times a day.    [provider]  furosemide (LASIX) 40 MG tablet Take 1 tablet (40 mg total) by mouth daily. Hold foe SBP <100 11/14/18   Deberah Pelton, NP  hydroxypropyl methylcellulose / hypromellose (ISOPTO TEARS / GONIOVISC) 2.5 % ophthalmic solution Place 1-2 drops into both eyes 3 (three) times daily as needed (dry/irritated eyes.).    [provider]  ipratropium (ATROVENT) 0.03 % nasal spray Place 1 spray into the nose daily as needed for rhinitis.  06/23/16   [provider]  magnesium oxide (MAG-OX) 400 (241.3 Mg) MG tablet Take 400 mg by mouth daily.  07/19/17   [provider]  metoprolol tartrate (LOPRESSOR) 25 MG tablet Take 1 tablet (25 mg total) by mouth 2 (two) times daily. Hold SYSTOLIC BP 99991111, HR [id number]   Deberah Pelton, NP  morphine (MS CONTIN) 15 MG 12 hr tablet Take 15 mg by mouth every 12 (twelve) hours.    [provider]  Multiple Vitamin (MULTIVITAMIN) tablet Take 1 tablet by mouth daily.    [provider]  oxyCODONE (OXY IR/ROXICODONE) 5 MG immediate release tablet Take 1 tablet (5 mg total) by mouth every 4 (four) hours as needed for severe pain. In addition to current chronic pain medication Patient taking differently: Take 5 mg by mouth every 4 (four) hours.  06/15/15   Loni Dolly, PA-C  potassium chloride (K-DUR) 10 MEQ tablet Take 1 tablet (10 mEq total) by mouth daily. 11/14/18   Deberah Pelton, NP  sildenafil (VIAGRA) 100 MG tablet Take 100 mg by mouth daily as needed for erectile dysfunction.    [provider]  TURMERIC PO Take 1  capsule by mouth daily.    [provider]    Family History    Family History  Family history unknown: Yes   He indicated that his mother is deceased. He indicated that his father is deceased.  Social History    Social History   Socioeconomic History   Marital status: Married    Spouse name: Not on file   Number of children: Not on file   Years of education: Not on file   Highest education level: Not on file  Occupational History   Not on file  Social Needs   Financial resource strain: Not on file   Food insecurity    Worry: Not on file    Inability: Not on file  Transportation needs    Medical: Not on file    Non-medical: Not on file  Tobacco Use   Smoking status: Never Smoker   Smokeless tobacco: Never Used  Substance and Sexual Activity   Alcohol use: Yes    Alcohol/week: 22.0 standard drinks    Types: 10 Standard drinks or equivalent, 12 Cans of beer per week    Comment: daily   Drug use: No   Sexual activity: Not on file  Lifestyle   Physical activity    Days per week: Not on file    Minutes per session: Not on file   Stress: Not on file  Relationships   Social connections    Talks on phone: Not on file    Gets together: Not on file    Attends religious service: Not on file    Active member of club or organization: Not on file    Attends meetings of clubs or organizations: Not on file    Relationship status: Not on file   Intimate partner violence    Fear of current or ex partner: Not on file    Emotionally abused: Not on file    Physically abused: Not on file    Forced sexual activity: Not on file  Other Topics Concern   Not on file  Social History Narrative   Not on file     Review of Systems    General:  No chills, fever, night sweats or weight changes.  Cardiovascular:  No chest pain, dyspnea on exertion, edema, orthopnea, palpitations, paroxysmal nocturnal dyspnea. Dermatological: No rash,  lesions/masses Respiratory: No cough, dyspnea Urologic: No hematuria, dysuria Abdominal:   No nausea, vomiting, diarrhea, bright red blood per rectum, melena, or hematemesis Neurologic:  No visual changes, wkns, changes in mental status. All other systems reviewed and are otherwise negative except as noted above.  Physical Exam    VS:  BP 138/78    Pulse 66    Temp (!) 97 F (36.1 C) (Temporal)    Ht 6\' 1"  (1.854 m)    Wt 204 lb (92.5 kg)    SpO2 97%    BMI 26.91 kg/m  , BMI Body mass index is 26.91 kg/m. GEN: Well nourished, well developed, in no acute distress. HEENT: normal. Neck: Supple, no JVD, carotid bruits, or masses. Cardiac: RRR, no murmurs, rubs, or gallops. No clubbing, cyanosis, generalized bilateral lower extremity nonpitting edema.  Radials/DP/PT 2+ and equal bilaterally.  Respiratory:  Respirations regular and unlabored, clear to auscultation bilaterally. GI: Soft, nontender, nondistended, BS + x 4. MS: no deformity or atrophy. Skin: warm and dry, no rash. Neuro:  Strength and sensation are intact. Psych: Normal affect.  Accessory Clinical Findings    ECG personally reviewed by me today- atrial flutter with variable AV block 71 bpm  EKG 11/14/2018 Normal sinus rhythm 63 bpm  EKG 11/04/2018: Sinus bradycardia 56 bpm  EKG 10/25/2018 Atrial flutter 4:1 AV conduction with incomplete right bundle branch block 62 bpm  EKG 10/15/2018 Atrial fibrillation 146 bpm  EKG 10/14/2018 Atrial flutter 4: 1 AV conduction 67 bpm  Echocardiogram 10/12/2018 1. The left ventricle has normal systolic function, with an ejection fraction of 55-60%. The cavity size was normal. There is mildly increased left ventricular wall thickness. Left ventricular diastolic Doppler parameters are consistent with impaired  relaxation. 2. The right ventricle has normal systolic function. The cavity was normal. There is no increase in right ventricular wall thickness. Right ventricular systolic  pressure is mildly  elevated with an estimated pressure of 36.0 mmHg. 3. Left atrial size was mildly dilated. 4. There is mild mitral annular calcification present. 5. The aortic valve is tricuspid. Mild thickening of the aortic valve. Mild calcification of the aortic valve. Aortic valve regurgitation is trivial by color flow Doppler. 6. The inferior vena cava was dilated in size with <50% respiratory variability.  Assessment & Plan   1.   Acute on chronic combined systolic and diastolic CHF- echocardiogram LVEF 55 to 123456, grade 1 diastolic dysfunction, Right ventricular systolic pressure is mildly elevated with an estimated pressure of 36.0 mmHg. Left atrial size was mildly dilated.    Generalized bilateral nonpitting edema, denies SOB and orthopnea. Use diltiazem  120 mg tablet for heart rate greater than 100 Decrease Lasix to 20 mg tablet daily  Continue metoprolol tartrate 25 mg tablet Twice daily Stop potassium chloride 10 mEq tablet daily Daily weights Low-sodium heart healthy diet  2.Atrial fibrillation with rapid ventricular response- EKG today atrial flutter with variable AV block 71 bpm  successful DCCV on 11/04/2018- patient misunderstood medication instructions and stopped taking diltiazem Continue apixaban 5 mg tablet twice daily Use diltiazem  120 mg tablet for heart rate greater than 100 Decrease Lasix 20 mg tablet daily  Continue metoprolol tartrate 25 mg tablet Twice daily Order BMP CHA2DS2/VAS Stroke Risk Points 2   All medications reviewed with patient.  He expressed understanding had no questions during the time of the visit.  Disposition: Follow-up with Dr. Gwenlyn Found 2 months.    Deberah Pelton, NP-C 11/28/2018, 4:46 PM

## 2018-11-28 NOTE — Telephone Encounter (Signed)
Patient called stating he had appt today at 1:15pm, only gave me first name of name of [name]. He stated he was at [employer], and is running late.  I advised him, he might have to reschedule his appt if he is more than 15 mins late. He stated he was come no matter how late he was, he can't tell the mechanic to rush and he hung up the phone. Had to look up patient by phone number.

## 2018-11-28 NOTE — Patient Instructions (Signed)
Medication Instructions:  TAKE Diltiazem 120mg  once a day as needed if your heart rate is over 100 STOP Potassium  DECREASE Lasix to 20mg  Take 1 tablet once a day  If you need a refill on your cardiac medications before your next appointment, please call your pharmacy.   Lab work: Your physician recommends that you return for lab work in: TODAY-BMET If you have labs (blood work) drawn today and your tests are completely normal, you will receive your results only by: Marland Kitchen MyChart Message (if you have MyChart) OR . A paper copy in the mail If you have any lab test that is abnormal or we need to change your treatment, we will call you to review the results.  Testing/Procedures: NONE  Follow-Up: At [facility], you and your health needs are our priority.  As part of our continuing mission to provide you with exceptional heart care, we have created designated Provider Care Teams.  These Care Teams include your primary Cardiologist (physician) and Advanced Practice Providers (APPs -  Physician Assistants and Nurse Practitioners) who all work together to provide you with the care you need, when you need it. . FOLLOW UP WITH DR BERRY AS SCHEDULED   Any Other Special Instructions Will Be Listed Below (If Applicable).

## 2018-11-29 LAB — BASIC METABOLIC PANEL
BUN/Creatinine Ratio: 31 — ABNORMAL HIGH (ref 10–24)
BUN: 26 mg/dL (ref 8–27)
CO2: 28 mmol/L (ref 20–29)
Calcium: 9.3 mg/dL (ref 8.6–10.2)
Chloride: 99 mmol/L (ref 96–106)
Creatinine, Ser: 0.84 mg/dL (ref 0.76–1.27)
GFR calc Af Amer: 100 mL/min/{1.73_m2} (ref >59)
GFR calc non Af Amer: 87 mL/min/{1.73_m2} (ref >59)
Glucose: 113 mg/dL — ABNORMAL HIGH (ref 65–99)
Potassium: 4.8 mmol/L (ref 3.5–5.2)
Sodium: 144 mmol/L (ref 134–144)

## 2018-12-25 ENCOUNTER — Other Ambulatory Visit: Payer: Self-pay

## 2019-01-10 ENCOUNTER — Ambulatory Visit (INDEPENDENT_AMBULATORY_CARE_PROVIDER_SITE_OTHER): Payer: Medicare Other | Admitting: Podiatry

## 2019-01-10 ENCOUNTER — Other Ambulatory Visit: Payer: Self-pay

## 2019-01-10 DIAGNOSIS — M79672 Pain in left foot: Secondary | ICD-10-CM

## 2019-01-10 DIAGNOSIS — M79671 Pain in right foot: Secondary | ICD-10-CM | POA: Diagnosis not present

## 2019-01-10 DIAGNOSIS — L600 Ingrowing nail: Secondary | ICD-10-CM

## 2019-01-10 NOTE — Patient Instructions (Signed)

## 2019-01-13 ENCOUNTER — Encounter: Payer: Self-pay | Admitting: Podiatry

## 2019-01-13 NOTE — Progress Notes (Signed)
Subjective:  Patient ID: [name], male    DOB: [birth year],  MRN: [id number]  Chief Complaint  Patient presents with  . Nail Problem    pt is here for an ingrown toenail of the right great toenail medial side of the foot    73 y.o. male presents with the above complaint.  He states that he has a painful right lateral nail ingrown and left medial border ingrown.  It has been going for about a year.  There is occasional redness that happens if he has been ambulating for too long with the shoes on.  Pain is elevated with pressure.   Review of Systems: Negative except as noted in the HPI. Denies N/V/F/Ch.  Past Medical History:  Diagnosis Date  . Atrial fibrillation with rapid ventricular response ([place]) 10/25/2018   Successful  DCCV shock x1 200 J 11/04/2018  . Chronic back pain    buldging disc  . Depression   . GERD (gastroesophageal reflux disease)   . GOUT    takes Allopurinol daily and Colchicine as needed  . History of bronchitis    many yrs ago  . History of colonoscopy    benign  . History of shingles   . Joint pain   . Joint swelling   . Muscle spasm    takes Robaxin as needed  . Nocturia   . OSTEOARTHRITIS   . Peripheral edema    takes Lasix daily as needed  . Pneumonia 1969  . Seasonal allergies    takes Zyrtec and uses Flonase is needed  . TESTICULAR HYPOFUNCTION   . Zenker diverticula     Current Outpatient Medications:  .  allopurinol (ZYLOPRIM) 100 MG tablet, Take 300 mg by mouth daily., Disp: , Rfl:  .  apixaban (ELIQUIS) 5 MG TABS tablet, Take 1 tablet (5 mg total) by mouth 2 (two) times daily., Disp: 60 tablet, Rfl: 3 .  ARTIFICIAL TEARS 0.2-0.2-1 % SOLN, , Disp: , Rfl:  .  CALCIUM 600 1500 (600 Ca) MG TABS tablet, Take 600 mg of elemental calcium by mouth 2 (two) times daily. , Disp: , Rfl:  .  capsaicin (ZOSTRIX) 0.025 % cream, Apply 1 application topically 2 (two) times daily as needed (pain.)., Disp: , Rfl:  .  cyanocobalamin 2000 MCG tablet,  Take 2,000 mcg by mouth daily., Disp: , Rfl:  .  diltiazem (CARDIZEM CD) 120 MG 24 hr capsule, Take 1 capsule (120 mg total) by mouth daily as needed (IF YOUR HEART RATE GOES OVER 100)., Disp: 90 capsule, Rfl: 1 .  famotidine (PEPCID) 20 MG tablet, Take 20 mg by mouth 2 (two) times a day., Disp: , Rfl:  .  furosemide (LASIX) 20 MG tablet, Take 1 tablet (20 mg total) by mouth daily. Hold foe SBP <100, Disp: 90 tablet, Rfl: 1 .  ibuprofen (ADVIL) 600 MG tablet, , Disp: , Rfl:  .  indomethacin (INDOCIN) 50 MG capsule, , Disp: , Rfl:  .  magnesium oxide (MAG-OX) 400 (241.3 Mg) MG tablet, Take 400 mg by mouth 2 (two) times daily. , Disp: , Rfl:  .  magnesium oxide (MAG-OX) 400 MG tablet, , Disp: , Rfl:  .  metoprolol tartrate (LOPRESSOR) 25 MG tablet, Take 1 tablet (25 mg total) by mouth 2 (two) times daily. Hold SYSTOLIC BP 99991111, HR 123XX123, Disp: 30 tablet, Rfl: 0 .  morphine (MS CONTIN) 15 MG 12 hr tablet, Take 15 mg by mouth every 12 (twelve) hours., Disp: , Rfl:  .  Multiple Vitamin (MULTIVITAMIN) tablet, Take 1 tablet by mouth daily., Disp: , Rfl:  .  oxyCODONE (OXY IR/ROXICODONE) 5 MG immediate release tablet, Take 1 tablet (5 mg total) by mouth every 4 (four) hours as needed for severe pain. In addition to current chronic pain medication (Patient taking differently: Take 5 mg by mouth every 4 (four) hours. ), Disp: 30 tablet, Rfl: 0 .  sildenafil (VIAGRA) 100 MG tablet, Take 100 mg by mouth daily as needed for erectile dysfunction., Disp: , Rfl:  .  TIADYLT ER 240 MG 24 hr capsule, , Disp: , Rfl:  .  TURMERIC PO, Take 1 capsule by mouth daily., Disp: , Rfl:  .  vitamin B-12 (CYANOCOBALAMIN) 1000 MCG tablet, , Disp: , Rfl:  .  hydroxypropyl methylcellulose / hypromellose (ISOPTO TEARS / GONIOVISC) 2.5 % ophthalmic solution, Place 1-2 drops into both eyes 3 (three) times daily as needed (dry/irritated eyes.)., Disp: , Rfl:  .  ipratropium (ATROVENT) 0.03 % nasal spray, Place 1 spray into the nose daily  as needed for rhinitis. , Disp: , Rfl:   Social History   Tobacco Use  Smoking Status Never Smoker  Smokeless Tobacco Never Used    No Known Allergies Objective:  There were no vitals filed for this visit. There is no height or weight on file to calculate BMI. Constitutional Well developed. Well nourished.  Vascular Dorsalis pedis pulses palpable bilaterally. Posterior tibial pulses palpable bilaterally. Capillary refill normal to all digits.  No cyanosis or clubbing noted. Pedal hair growth normal.  Neurologic Normal speech. Oriented to person, place, and time. Epicritic sensation to light touch grossly present bilaterally.  Dermatologic Painful ingrowing nail at Right hallux lateral border  and left hallux medial border nail borders of the hallux nail bilaterally. No other open wounds. No skin lesions.  Orthopedic: Normal joint ROM without pain or crepitus bilaterally. No visible deformities. No bony tenderness.   Radiographs: None Assessment:   1. Pain in both feet   2. Ingrown toenail of right foot   3. Ingrown toenail of left foot    Plan:  Patient was evaluated and treated and all questions answered.  Ingrown Nail, right hallux lateral border, left hallux medial border -Patient elects to proceed with minor surgery to remove ingrown toenail removal today. Consent reviewed and signed by patient. -Ingrown nail excised. See procedure note. -Educated on post-procedure care including soaking. Written instructions provided and reviewed. -Patient to follow up in 2 weeks for nail check.  Procedure: Excision of Ingrown Toenail Location: Right hallux lateral border and left hallux medial border Anesthesia: Lidocaine 1% plain; 1.5 mL and Marcaine 0.5% plain; 1.5 mL, digital block. Skin Prep: Betadine. Dressing: Silvadene; telfa; dry, sterile, compression dressing. Technique: Following skin prep, the toe was exsanguinated and a tourniquet was secured at the base of the  toe. The affected nail border was freed, split with a nail splitter, and excised. Chemical matrixectomy was then performed with phenol and irrigated out with alcohol. The tourniquet was then removed and sterile dressing applied. Disposition: Patient tolerated procedure well. Patient to return in 2 weeks for follow-up.   Return in about 2 weeks (around 01/24/2019).

## 2019-01-24 ENCOUNTER — Ambulatory Visit: Payer: Medicare Other | Admitting: Podiatry

## 2019-02-03 ENCOUNTER — Other Ambulatory Visit: Payer: Self-pay | Admitting: Orthopaedic Surgery

## 2019-02-03 DIAGNOSIS — M25512 Pain in left shoulder: Secondary | ICD-10-CM

## 2019-02-04 ENCOUNTER — Other Ambulatory Visit: Payer: Self-pay

## 2019-02-04 ENCOUNTER — Ambulatory Visit (INDEPENDENT_AMBULATORY_CARE_PROVIDER_SITE_OTHER): Payer: Medicare Other | Admitting: Cardiovascular Disease

## 2019-02-04 ENCOUNTER — Encounter: Payer: Self-pay | Admitting: Cardiovascular Disease

## 2019-02-04 VITALS — BP 116/69 | HR 64 | Temp 97.0°F | Ht 73.0 in | Wt 210.0 lb

## 2019-02-04 DIAGNOSIS — I480 Paroxysmal atrial fibrillation: Secondary | ICD-10-CM

## 2019-02-04 DIAGNOSIS — I5043 Acute on chronic combined systolic (congestive) and diastolic (congestive) heart failure: Secondary | ICD-10-CM

## 2019-02-04 NOTE — Patient Instructions (Addendum)
Medication Instructions:  Stop taking Eliquis. Stop taking Furosemide  If you need a refill on your cardiac medications before your next appointment, please call your pharmacy.   Lab work: NONE  Testing/Procedures: NONE  Follow-Up: At [facility], you and your health needs are our priority.  As part of our continuing mission to provide you with exceptional heart care, we have created designated Provider Care Teams.  These Care Teams include your primary Cardiologist (physician) and Advanced Practice Providers (APPs -  Physician Assistants and Nurse Practitioners) who all work together to provide you with the care you need, when you need it. You may see Quay Burow, MD or one of the following Advanced Practice Providers on your designated Care Team:    Kerin Ransom, PA-C  [place], [place]  Coletta Memos, [place] Your physician wants you to follow-up in: 1 month with Coletta Memos NP and Dr Gwenlyn Found in 6 months. You will receive a reminder letter in the mail two months in advance. If you don't receive a letter, please call our office to schedule the follow-up appointment.

## 2019-02-04 NOTE — Assessment & Plan Note (Signed)
Acute on chronic diastolic heart failure with echo performed 10/12/2018 revealing normal LV systolic function as well as diastolic dysfunction.  He was diuresed and placed on oral furosemide.  At that time he was also found to be in A. fib with RVR and was rate controlled.  At this point, he denies shortness of breath.  Going to discontinue his furosemide and monitor for swelling and weight increase.

## 2019-02-04 NOTE — Assessment & Plan Note (Signed)
History of A. fib atrial fibrillation status post outpatient DC cardioversion by Dr. Acie Fredrickson successfully to sinus rhythm point 11/04/2018 after being anticoagulated for 4 weeks.  When he came back in to see Coletta Memos, NP on 11/28/2018 he was back in a flutter with with 4:1 conduction.  He is completely unaware that he is in this rhythm.  At this point, I do not see the benefit of trying to cardiovert him again.  His CHA2DS2-VASc score is 1 and therefore I am going to discontinue his Eliquis oral anticoagulation.  I am also going to discontinue his furosemide he will monitor his weight and lower extremities.  Should he start to swell again he should go back on his furosemide.

## 2019-02-04 NOTE — Progress Notes (Signed)
02/04/2019 [name]   1946/03/11  [id number]  Primary Physician Billie Ruddy, MD Primary Cardiologist: Lorretta Harp MD FACP, [place], [place], [place]  HPI:  [name] is a 73 y.o. mildly overweight married Caucasian male father of 2, grandfather of 1 grandchild who I initially saw in consultation 10/12/2018 when he was admitted with diastolic heart failure and A. fib with RVR.  He is retired from the [employer].  He does not smoke.  He drinks daily but only 2 beers a night and occasional wine with dinner.  He has no history of diabetes, hypertension or hyperlipidemia.  Never had a heart attack or stroke.  There is no family's for heart disease.  He is fairly active.  He did play division 3 football and hockey in his younger years.  He is unaware that he is in A. fib at this point.  He did see Coletta Memos, NP back in the office on 11/28/2018 and was in a flutter with 41 conduction.   Current Meds  Medication Sig  . allopurinol (ZYLOPRIM) 100 MG tablet Take 300 mg by mouth daily.  Marland Kitchen apixaban (ELIQUIS) 5 MG TABS tablet Take 1 tablet (5 mg total) by mouth 2 (two) times daily.  . ARTIFICIAL TEARS 0.2-0.2-1 % SOLN   . CALCIUM 600 1500 (600 Ca) MG TABS tablet Take 600 mg of elemental calcium by mouth 2 (two) times daily.   . capsaicin (ZOSTRIX) 0.025 % cream Apply 1 application topically 2 (two) times daily as needed (pain.).  Marland Kitchen cyanocobalamin 2000 MCG tablet Take 2,000 mcg by mouth daily.  Marland Kitchen diltiazem (CARDIZEM CD) 120 MG 24 hr capsule Take 1 capsule (120 mg total) by mouth daily as needed (IF YOUR HEART RATE GOES OVER 100).  . famotidine (PEPCID) 20 MG tablet Take 20 mg by mouth 2 (two) times a day.  . furosemide (LASIX) 20 MG tablet Take 1 tablet (20 mg total) by mouth daily. Hold foe SBP <100  . hydroxypropyl methylcellulose / hypromellose (ISOPTO TEARS / GONIOVISC) 2.5 % ophthalmic solution Place 1-2 drops into both eyes 3 (three) times daily as needed (dry/irritated eyes.).  Marland Kitchen  ibuprofen (ADVIL) 600 MG tablet   . indomethacin (INDOCIN) 50 MG capsule   . ipratropium (ATROVENT) 0.03 % nasal spray Place 1 spray into the nose daily as needed for rhinitis.   . magnesium oxide (MAG-OX) 400 (241.3 Mg) MG tablet Take 400 mg by mouth 2 (two) times daily.   . magnesium oxide (MAG-OX) 400 MG tablet   . metoprolol tartrate (LOPRESSOR) 25 MG tablet Take 1 tablet (25 mg total) by mouth 2 (two) times daily. Hold SYSTOLIC BP 99991111, HR 123XX123  . morphine (MS CONTIN) 15 MG 12 hr tablet Take 15 mg by mouth every 12 (twelve) hours.  . Multiple Vitamin (MULTIVITAMIN) tablet Take 1 tablet by mouth daily.  Marland Kitchen oxyCODONE (OXY IR/ROXICODONE) 5 MG immediate release tablet Take 1 tablet (5 mg total) by mouth every 4 (four) hours as needed for severe pain. In addition to current chronic pain medication (Patient taking differently: Take 5 mg by mouth every 4 (four) hours. )  . sildenafil (VIAGRA) 100 MG tablet Take 100 mg by mouth daily as needed for erectile dysfunction.  Deborah Chalk ER 240 MG 24 hr capsule   . TURMERIC PO Take 1 capsule by mouth daily.  . vitamin B-12 (CYANOCOBALAMIN) 1000 MCG tablet      No Known Allergies  Social History  Socioeconomic History  . Marital status: Married    Spouse name: Not on file  . Number of children: Not on file  . Years of education: Not on file  . Highest education level: Not on file  Occupational History  . Not on file  Social Needs  . Financial resource strain: Not on file  . Food insecurity    Worry: Not on file    Inability: Not on file  . Transportation needs    Medical: Not on file    Non-medical: Not on file  Tobacco Use  . Smoking status: Never Smoker  . Smokeless tobacco: Never Used  Substance and Sexual Activity  . Alcohol use: Yes    Alcohol/week: 22.0 standard drinks    Types: 10 Standard drinks or equivalent, 12 Cans of beer per week    Comment: daily  . Drug use: No  . Sexual activity: Not on file  Lifestyle  . Physical  activity    Days per week: Not on file    Minutes per session: Not on file  . Stress: Not on file  Relationships  . Social Herbalist on phone: Not on file    Gets together: Not on file    Attends religious service: Not on file    Active member of club or organization: Not on file    Attends meetings of clubs or organizations: Not on file    Relationship status: Not on file  . Intimate partner violence    Fear of current or ex partner: Not on file    Emotionally abused: Not on file    Physically abused: Not on file    Forced sexual activity: Not on file  Other Topics Concern  . Not on file  Social History Narrative  . Not on file     Review of Systems: General: negative for chills, fever, night sweats or weight changes.  Cardiovascular: negative for chest pain, dyspnea on exertion, edema, orthopnea, palpitations, paroxysmal nocturnal dyspnea or shortness of breath Dermatological: negative for rash Respiratory: negative for cough or wheezing Urologic: negative for hematuria Abdominal: negative for nausea, vomiting, diarrhea, bright red blood per rectum, melena, or hematemesis Neurologic: negative for visual changes, syncope, or dizziness All other systems reviewed and are otherwise negative except as noted above.    Blood pressure 116/69, pulse 64, temperature (!) 97 F (36.1 C), height 6\' 1"  (1.854 m), weight 210 lb (95.3 kg), SpO2 98 %.  General appearance: alert and no distress Neck: no adenopathy, no carotid bruit, no JVD, supple, symmetrical, trachea midline and thyroid not enlarged, symmetric, no tenderness/mass/nodules Lungs: clear to auscultation bilaterally Heart: regular rate and rhythm, S1, S2 normal, no murmur, click, rub or gallop Extremities: extremities normal, atraumatic, no cyanosis or edema Pulses: 2+ and symmetric Skin: Skin color, texture, turgor normal. No rashes or lesions Neurologic: Alert and oriented X 3, normal strength and tone. Normal  symmetric reflexes. Normal coordination and gait  EKG atrial flutter with variable block and ventricular response of 63.  I personally reviewed this EKG.  ASSESSMENT AND PLAN:   Atrial fibrillation (HCC) History of A. fib atrial fibrillation status post outpatient DC cardioversion by Dr. Acie Fredrickson successfully to sinus rhythm point 11/04/2018 after being anticoagulated for 4 weeks.  When he came back in to see Coletta Memos, NP on 11/28/2018 he was back in a flutter with with 4:1 conduction.  He is completely unaware that he is in this rhythm.  At this point,  I do not see the benefit of trying to cardiovert him again.  His CHA2DS2-VASc score is 1 and therefore I am going to discontinue his Eliquis oral anticoagulation.  I am also going to discontinue his furosemide he will monitor his weight and lower extremities.  Should he start to swell again he should go back on his furosemide.  Acute on chronic combined systolic and diastolic CHF (congestive heart failure) (HCC) Acute on chronic diastolic heart failure with echo performed 10/12/2018 revealing normal LV systolic function as well as diastolic dysfunction.  He was diuresed and placed on oral furosemide.  At that time he was also found to be in A. fib with RVR and was rate controlled.  At this point, he denies shortness of breath.  Going to discontinue his furosemide and monitor for swelling and weight increase.      Lorretta Harp MD FACP,FACC,FAHA, [place] [facility] 02/04/2019 1:58 PM

## 2019-02-24 ENCOUNTER — Ambulatory Visit
Admission: RE | Admit: 2019-02-24 | Discharge: 2019-02-24 | Disposition: A | Discharge status: Home / Self Care | Payer: Medicare Other | Source: Ambulatory Visit | Attending: Orthopaedic Surgery | Admitting: Orthopaedic Surgery

## 2019-02-24 ENCOUNTER — Other Ambulatory Visit: Payer: Self-pay

## 2019-02-24 DIAGNOSIS — M25512 Pain in left shoulder: Secondary | ICD-10-CM

## 2019-03-06 ENCOUNTER — Encounter: Payer: Self-pay | Admitting: General Practice

## 2019-03-06 ENCOUNTER — Other Ambulatory Visit: Payer: Self-pay

## 2019-03-06 ENCOUNTER — Ambulatory Visit (INDEPENDENT_AMBULATORY_CARE_PROVIDER_SITE_OTHER): Payer: Medicare Other | Admitting: General Practice

## 2019-03-06 VITALS — BP 115/62 | HR 63 | Ht 73.0 in | Wt 220.8 lb

## 2019-03-06 DIAGNOSIS — I5043 Acute on chronic combined systolic (congestive) and diastolic (congestive) heart failure: Secondary | ICD-10-CM

## 2019-03-06 DIAGNOSIS — I483 Typical atrial flutter: Secondary | ICD-10-CM

## 2019-03-06 NOTE — Patient Instructions (Addendum)
Special Instructions: PLEASE FOLLOW SALTY 6 ATTACHED  PLEASE FOLLOW HEART FAILURE-ATTACHED  Follow-Up: IN MAY 2021 Please call our office 2 months in advance, [facility] 2021 to schedule this MAY 2021 appointment. In Person You may see Quay Burow, MD or one of the following Advanced Practice Providers on your designated Care Team:  Kerin Ransom, PA-C  [place], [place]  Coletta Memos, [place].    At [facility], you and your health needs are our priority.  As part of our continuing mission to provide you with exceptional heart care, we have created designated Provider Care Teams.  These Care Teams include your primary Cardiologist (physician) and Advanced Practice Providers (APPs -  Physician Assistants and Nurse Practitioners) who all work together to provide you with the care you need, when you need it.  Thank you for choosing [facility] at [facility]!!             Happy Holidays!!  Heart Failure Action Plan A heart failure action plan helps you understand what to do when you have symptoms of heart failure. Follow the plan that was created by you and your health care provider. Review your plan each time you visit your health care provider. Red zone These signs and symptoms mean you should get medical help right away:  You have trouble breathing when resting.  You have a dry cough that is getting worse.  You have swelling or pain in your legs or abdomen that is getting worse.  You suddenly gain more than 2-3 lb (0.9-1.4 kg) in a day, or more than 5 lb (2.3 kg) in one week. This amount may be more or less depending on your condition.  You have trouble staying awake or you feel confused.  You have chest pain.  You do not have an appetite.  You pass out. If you experience any of these symptoms:  Call your local emergency services (911 in the [place] right away or seek help at the emergency department of the nearest hospital. Yellow zone These signs and symptoms mean your condition  may be getting worse and you should make some changes:  You have trouble breathing when you are active or you need to sleep with extra pillows.  You have swelling in your legs or abdomen.  You gain 2-3 lb (0.9-1.4 kg) in one day, or 5 lb (2.3 kg) in one week. This amount may be more or less depending on your condition.  You get tired easily.  You have trouble sleeping.  You have a dry cough. If you experience any of these symptoms:  Contact your health care provider within the next day.  Your health care provider may adjust your medicines. Green zone These signs mean you are doing well and can continue what you are doing:  You do not have shortness of breath.  You have very little swelling or no new swelling.  Your weight is stable (no gain or loss).  You have a normal activity level.  You do not have chest pain or any other new symptoms. Follow these instructions at home:  Take over-the-counter and prescription medicines only as told by your health care provider.  Weigh yourself daily. Your target weight is 210 lb. ? Call your health care provider if you gain more than 3 lb in a day, or more than 5 lb  in one week.  Eat a heart-healthy diet. Work with a diet and nutrition specialist (dietitian) to create an eating plan that is best for you.  Keep all follow-up visits as told by your health care provider. This is important. Where to find more information  American Heart Association: www.heart.org Summary  Follow the action plan that was created by you and your health care provider.  Get help right away if you have any symptoms in the Red zone. This information is not intended to replace advice given to you by your health care provider. Make sure you discuss any questions you have with your health care provider. Document Released: 04/29/2016 Document Revised: 03/02/2017 Document Reviewed: 04/29/2016 Elsevier Patient Education  2020 [employer].

## 2019-03-06 NOTE — Progress Notes (Signed)
Cardiology Clinic Note   Patient Name: [name] Date of Encounter: 03/06/2019  Primary Care Provider:  Billie Ruddy, MD Primary Cardiologist:  David Burow, MD  Patient Profile    [name]. [age][place]41 year old male presents today for follow-up of his chronic systolic and diastolic CHF, and atrial flutter.  Past Medical History    Past Medical History:  Diagnosis Date  . Atrial fibrillation with rapid ventricular response ([place]) 10/25/2018   Successful  DCCV shock x1 200 J 11/04/2018  . Chronic back pain    buldging disc  . Depression   . GERD (gastroesophageal reflux disease)   . GOUT    takes Allopurinol daily and Colchicine as needed  . History of bronchitis    many yrs ago  . History of colonoscopy    benign  . History of shingles   . Joint pain   . Joint swelling   . Muscle spasm    takes Robaxin as needed  . Nocturia   . OSTEOARTHRITIS   . Peripheral edema    takes Lasix daily as needed  . Pneumonia 1969  . Seasonal allergies    takes Zyrtec and uses Flonase is needed  . TESTICULAR HYPOFUNCTION   . Zenker diverticula    Past Surgical History:  Procedure Laterality Date  . ANKLE SURGERY     4 times  . CARDIOVERSION N/A 11/04/2018   Procedure: CARDIOVERSION;  Surgeon: Acie Fredrickson Wonda Cheng, MD;  Location: [place];  Service: Cardiovascular;  Laterality: N/A;  . COLONOSCOPY    . ELBOW SURGERY Right    torn tendon  . INGUINAL HERNIA REPAIR     right  . KNEE SURGERY     3 on each knee  . SHOULDER ARTHROSCOPY Right 06/15/2015   Procedure: RIGHT SHOULDER ARTHROSCOPY, ACROMIOPLASTY, DEBRIDEMENT;  Surgeon: Melrose Nakayama, MD;  Location: [place];  Service: Orthopedics;  Laterality: Right;  . shoulder arthrosopy Left   . TONSILLECTOMY AND ADENOIDECTOMY    . TUMOR EXCISION     sebaceous cyst removed from chest  . ZENKER'S DIVERTICULECTOMY ENDOSCOPIC N/A 06/14/2016   Procedure: Stanford Breed DIVERTICULECTOMY ENDOSCOPIC;  Surgeon: Izora Gala, MD;  Location:  [place];  Service: ENT;  Laterality: N/A;    Allergies  No Known Allergies  History of Present Illness    Mr. [name] was last seen on 11/14/2018.  He underwent successful DCCV on 11/04/2018.  He received 1 shock with 200 J.  During his 11/14/2018 clinic visit he was still in sinus rhythm however, he maintained his +2 bilateral pitting lower extremity edema and was kept on diuresis with potassium supplementation.  His PMH also includes chronic venous insufficiency, acute on chronic combined systolic and diastolic heart failure, allergic rhinitis, pulmonary nodule left lower lobe, Zenker's diverticulum, osteoarthritis, gout, low back pain, generalized weakness, vitamin B12 deficiency, and macrocytic anemia.  He saw me on 11/28/2018  and stated that he felt well and he had started lifting light weights again daily.  He felt like he had more energy and had noticed that his legs were less swollen.  He stated he was not able to walk for long distances due to his right and left knee pain.  He also indicates that he had stopped taking his diltiazem because he thought he did not need to take it any longer.  He saw Dr. Gwenlyn [name] on 02/04/2019.  During that time he was in atrial flutter.  Felt well.  And his CHA2DS2-VASc score was calculated at 1.  His  Eliquis was stopped and his furosemide was changed to as needed for lower extremity edema.  He presents the clinic today and states he continues to feel well.  He is currently taking care of his son that has injured his knee.  He describes the knee injury as extensive needing an MRI.  He continues to be in atrial flutter with 4 1 AV conduction with an incomplete right bundle branch block.  He has not noticed any irregular beats or fast heart rates.  He feels the swelling is well controlled and he is happy that he has not gone back into atrial fibrillation.  He denies chest pain, shortness of breath, lower extremity edema, fatigue, palpitations, melena, hematuria,  hemoptysis, diaphoresis, weakness, presyncope, syncope, orthopnea, and PND.   Home Medications    Prior to Admission medications   Medication Sig Start Date End Date Taking? Authorizing Provider  allopurinol (ZYLOPRIM) 100 MG tablet Take 300 mg by mouth daily.    [provider]  ARTIFICIAL TEARS 0.2-0.2-1 % SOLN  12/31/18   [provider]  CALCIUM 600 1500 (600 Ca) MG TABS tablet Take 600 mg of elemental calcium by mouth 2 (two) times daily.  02/16/16   [provider]  capsaicin (ZOSTRIX) 0.025 % cream Apply 1 application topically 2 (two) times daily as needed (pain.).    [provider]  cyanocobalamin 2000 MCG tablet Take 2,000 mcg by mouth daily.    [provider]  diltiazem (CARDIZEM CD) 120 MG 24 hr capsule Take 1 capsule (120 mg total) by mouth daily as needed (IF YOUR HEART RATE GOES OVER 100). 11/28/18   David Pelton, NP  famotidine (PEPCID) 20 MG tablet Take 20 mg by mouth 2 (two) times a day.    [provider]  hydroxypropyl methylcellulose / hypromellose (ISOPTO TEARS / GONIOVISC) 2.5 % ophthalmic solution Place 1-2 drops into both eyes 3 (three) times daily as needed (dry/irritated eyes.).    [provider]  ibuprofen (ADVIL) 600 MG tablet  12/31/18   [provider]  indomethacin (INDOCIN) 50 MG capsule  12/31/18   [provider]  ipratropium (ATROVENT) 0.03 % nasal spray Place 1 spray into the nose daily as needed for rhinitis.  06/23/16   [provider]  magnesium oxide (MAG-OX) 400 (241.3 Mg) MG tablet Take 400 mg by mouth 2 (two) times daily.  07/19/17   [provider]  magnesium oxide (MAG-OX) 400 MG tablet  12/31/18   [provider]  metoprolol tartrate (LOPRESSOR) 25 MG tablet Take 1 tablet (25 mg total) by mouth 2 (two) times daily. Hold SYSTOLIC BP 99991111, HR 123XX123 A999333   David Pelton, NP  morphine (MS CONTIN) 15 MG 12 hr tablet Take 15 mg by mouth every 12  (twelve) hours.    [provider]  Multiple Vitamin (MULTIVITAMIN) tablet Take 1 tablet by mouth daily.    [provider]  oxyCODONE (OXY IR/ROXICODONE) 5 MG immediate release tablet Take 1 tablet (5 mg total) by mouth every 4 (four) hours as needed for severe pain. In addition to current chronic pain medication Patient taking differently: Take 5 mg by mouth every 4 (four) hours.  06/15/15   Loni Dolly, PA-C  sildenafil (VIAGRA) 100 MG tablet Take 100 mg by mouth daily as needed for erectile dysfunction.    [provider]  TIADYLT ER 240 MG 24 hr capsule  11/29/18   [provider]  TURMERIC PO Take 1 capsule  by mouth daily.    [provider]  vitamin B-12 (CYANOCOBALAMIN) 1000 MCG tablet  12/31/18   [provider]    Family History    Family History  Family history unknown: Yes   He indicated that his mother is deceased. He indicated that his father is deceased.  Social History    Social History   Socioeconomic History  . Marital status: Married    Spouse name: Not on file  . Number of children: Not on file  . Years of education: Not on file  . Highest education level: Not on file  Occupational History  . Not on file  Social Needs  . Financial resource strain: Not on file  . Food insecurity    Worry: Not on file    Inability: Not on file  . Transportation needs    Medical: Not on file    Non-medical: Not on file  Tobacco Use  . Smoking status: Never Smoker  . Smokeless tobacco: Never Used  Substance and Sexual Activity  . Alcohol use: Yes    Alcohol/week: 22.0 standard drinks    Types: 10 Standard drinks or equivalent, 12 Cans of beer per week    Comment: daily  . Drug use: No  . Sexual activity: Not on file  Lifestyle  . Physical activity    Days per week: Not on file    Minutes per session: Not on file  . Stress: Not on file  Relationships  . Social Herbalist on phone: Not on file    Gets  together: Not on file    Attends religious service: Not on file    Active member of club or organization: Not on file    Attends meetings of clubs or organizations: Not on file    Relationship status: Not on file  . Intimate partner violence    Fear of current or ex partner: Not on file    Emotionally abused: Not on file    Physically abused: Not on file    Forced sexual activity: Not on file  Other Topics Concern  . Not on file  Social History Narrative  . Not on file     Review of Systems    General:  No chills, fever, night sweats or weight changes.  Cardiovascular:  No chest pain, dyspnea on exertion, edema, orthopnea, palpitations, paroxysmal nocturnal dyspnea. Dermatological: No rash, lesions/masses Respiratory: No cough, dyspnea Urologic: No hematuria, dysuria Abdominal:   No nausea, vomiting, diarrhea, bright red blood per rectum, melena, or hematemesis Neurologic:  No visual changes, wkns, changes in mental status. All other systems reviewed and are otherwise negative except as noted above.  Physical Exam    VS:  BP 115/62 (BP Location: Left Arm, Patient Position: Sitting, Cuff Size: Normal)   Pulse 63   Ht 6\' 1"  (1.854 m)   Wt 220 lb 12.8 oz (100.2 kg)   SpO2 96%   BMI 29.13 kg/m  , BMI Body mass index is 29.13 kg/m. GEN: Well nourished, well developed, in no acute distress. HEENT: normal. Neck: Supple, no JVD, carotid bruits, or masses. Cardiac: RRR, no murmurs, rubs, or gallops. No clubbing, cyanosis, bilateral lower extremity nonpitting edema.  Radials/DP/PT 2+ and equal bilaterally.  Respiratory:  Respirations regular and unlabored, clear to auscultation bilaterally. GI: Soft, nontender, nondistended, BS + x 4. MS: no deformity or atrophy. Skin: warm and dry, no rash. Neuro:  Strength and sensation are intact. Psych: Normal affect.  Accessory Clinical Findings    ECG personally reviewed by me today-atrial flutter with 4:1 AV conduction incomplete right  bundle branch block- No acute changes  EKG 02/04/2019 Atrial flutter 4: 1 conduction  EKG 11/28/2018 atrial flutter with variable AV block 71 bpm  EKG 11/14/2018 Normal sinus rhythm 63 bpm  EKG 11/04/2018: Sinus bradycardia 56 bpm  EKG 10/25/2018 Atrial flutter 4:1 AV conduction with incomplete right bundle branch block 62 bpm  EKG 10/15/2018 Atrial fibrillation 146 bpm  EKG 10/14/2018 Atrial flutter 4: 1 AV conduction 67 bpm  Echocardiogram 10/12/2018 1. The left ventricle has normal systolic function, with an ejection fraction of 55-60%. The cavity size was normal. There is mildly increased left ventricular wall thickness. Left ventricular diastolic Doppler parameters are consistent with impaired  relaxation. 2. The right ventricle has normal systolic function. The cavity was normal. There is no increase in right ventricular wall thickness. Right ventricular systolic pressure is mildly elevated with an estimated pressure of 36.0 mmHg. 3. Left atrial size was mildly dilated. 4. There is mild mitral annular calcification present. 5. The aortic valve is tricuspid. Mild thickening of the aortic valve. Mild calcification of the aortic valve. Aortic valve regurgitation is trivial by color flow Doppler. 6. The inferior vena cava was dilated in size with <50% respiratory variability.  Assessment & Plan   1.  Atrial flutter -EKG today  shows atrial flutter with 4: 1 AV conduction.  A successful DCCV on 11/04/2018- patient misunderstood medication instructions and stopped taking diltiazem.  Apixaban stopped on 02/04/2019 due to [place] vas score of  Use diltiazem 120mg  tablet for heart rate greater than 100 Continue metoprolol tartrate 25 mg tabletTwice daily CHA2DS2/VAS Stroke Risk Points 1   Acute on chronic combined systolic and diastolic CHF-echocardiogram LVEF 55 to 0000000 1 diastolic dysfunction,Right ventricular systolic pressure is mildly elevated with an estimated  pressure of 36.0 mmHg. Left atrial size was mildly dilated.  Generalized bilateral nonpitting edema, denies SOB and orthopnea. Use diltiazem 120mg  tablet for heart rate greater than 100 Use Lasix to 20 mg tablet daily as needed Continue metoprolol tartrate 25 mg tabletTwice daily Continue Daily weights Low-sodium heart healthy diet-salty 6 given Increase physical activity as tolerated  Keep follow-up appointment Dr. Gwenlyn [name] in 6 months.  Jossie Ng. [facility] [place] [address] Office [phone] Fax [phone]

## 2019-05-30 ENCOUNTER — Telehealth: Payer: Self-pay | Admitting: Cardiovascular Disease

## 2019-05-30 NOTE — Telephone Encounter (Signed)
Left voice mail to call back 

## 2019-05-30 NOTE — Telephone Encounter (Signed)
   [facility] Pre-operative Risk Assessment    Request for surgical clearance:  1. What type of surgery is being performed? Left shoulder arthroscopy and rotator cuff repair   2. When is this surgery scheduled? 06/26/2019   3. What type of clearance is required (medical clearance vs. Pharmacy clearance to hold med vs. Both)? Medical   4. Are there any medications that need to be held prior to surgery and how long? None specified    5. Practice name and name of physician performing surgery? Dr. Melrose Nakayama with [facility]   6. What is your office phone number [phone]    7.   What is your office fax number [phone]  8.   Anesthesia type (None, local, MAC, general) ? General   * request for notes, labs, EKG, special studies to be sent as well    [name] 05/30/2019, 7:43 AM  _________________________________________________________________   (provider comments below)

## 2019-06-02 NOTE — Telephone Encounter (Signed)
Changed address in EPIC

## 2019-06-02 NOTE — Telephone Encounter (Signed)
Callback pool, I will forward this clearance to requesting provider. Please mail a copy to the patient as well at the request of Mr. [name]. Also note, the address in our system is incorrect.   His address is  [address] [place] [address]   Street number is not [phone], I verified with the patient multiple times.

## 2019-06-02 NOTE — Telephone Encounter (Signed)
   Primary Cardiologist: Quay Burow, MD  Chart reviewed as part of pre-operative protocol coverage. Patient was contacted 06/02/2019 in reference to pre-operative risk assessment for pending surgery as outlined below.  [name] was last seen on 03/06/2019 by Coletta Memos NP.  Since that day, [name] has done well without chest pain or shortness of breath. He has been compliant with metoprolol rate control therapy. He is able to complete at least 4 METS of activity without any issue.   Therefore, based on ACC/AHA guidelines, the patient would be at acceptable risk for the planned procedure without further cardiovascular testing.   I will route this recommendation to the requesting party via Epic fax function and remove from pre-op pool.  Please call with questions.  [place], [place] 06/02/2019, 4:15 PM

## 2019-06-02 NOTE — Telephone Encounter (Signed)
Left message for the patient to call back and speak to the on-call preop APP of the day 

## 2019-06-02 NOTE — Telephone Encounter (Signed)
I have sent him a message via [place].

## 2019-06-16 ENCOUNTER — Telehealth: Payer: Self-pay | Admitting: Plastic Surgery

## 2019-06-16 NOTE — Telephone Encounter (Signed)

## 2019-06-17 ENCOUNTER — Ambulatory Visit (INDEPENDENT_AMBULATORY_CARE_PROVIDER_SITE_OTHER): Payer: Medicare Other | Admitting: Plastic Surgery

## 2019-06-17 ENCOUNTER — Encounter: Payer: Self-pay | Admitting: Plastic Surgery

## 2019-06-17 ENCOUNTER — Other Ambulatory Visit: Payer: Self-pay

## 2019-06-17 DIAGNOSIS — Z9011 Acquired absence of right breast and nipple: Secondary | ICD-10-CM

## 2019-06-17 DIAGNOSIS — N644 Mastodynia: Secondary | ICD-10-CM

## 2019-06-17 NOTE — Progress Notes (Signed)
Patient ID: [name], male    DOB: 20-Sep-1945, 74 y.o.   MRN: [id number]   Chief Complaint  Patient presents with  . Advice Only    collection of scar tissue and possible nerve damage of the chest  . Skin Problem    The patient is a 74 year old white male here for evaluation of his right breast.  In 2014 he underwent an excision of the right breast.  The pathology from the [place] shows it was a sebaceous cyst.  His scar is 5 cm across his right breast.  He does not have a nipple or areola on this side.  This has been very upsetting to him.  He is a big outdoors person.  He was asked to leave a public swimming pool due to fellow swimmer being upset at his lack of an areola.  He also complains of pain due to what appears to be scar contracture and capsule contracture from the excision.  It is tender to palpation.  There may be nerve that gets easily irritated with the palpation.  There is no sign of infection, swelling or redness.  Nothing makes it better.  The patient has multiple medical conditions including atrial fibrillation and gout.  He is 6 feet 1 inch tall and weighs 213 pounds.  According to his chart, he takes the following medications: morphine, metoprolol, diazepam and allopurinol.   Review of Systems  Constitutional: Negative.  Negative for activity change.  HENT: Negative.   Eyes: Negative.   Respiratory: Positive for chest tightness. Negative for shortness of breath.   Cardiovascular: Negative.   Gastrointestinal: Negative.   Genitourinary: Negative.   Musculoskeletal: Negative.   Hematological: Negative.     Past Medical History:  Diagnosis Date  . Atrial fibrillation with rapid ventricular response ([place]) 10/25/2018   Successful  DCCV shock x1 200 J 11/04/2018  . Chronic back pain    buldging disc  . Depression   . GERD (gastroesophageal reflux disease)   . GOUT    takes Allopurinol daily and Colchicine as needed  . History of bronchitis    many yrs ago  .  History of colonoscopy    benign  . History of shingles   . Joint pain   . Joint swelling   . Muscle spasm    takes Robaxin as needed  . Nocturia   . OSTEOARTHRITIS   . Peripheral edema    takes Lasix daily as needed  . Pneumonia 1969  . Seasonal allergies    takes Zyrtec and uses Flonase is needed  . TESTICULAR HYPOFUNCTION   . Zenker diverticula     Past Surgical History:  Procedure Laterality Date  . ANKLE SURGERY     4 times  . CARDIOVERSION N/A 11/04/2018   Procedure: CARDIOVERSION;  Surgeon: Acie Fredrickson Wonda Cheng, MD;  Location: [facility];  Service: Cardiovascular;  Laterality: N/A;  . COLONOSCOPY    . ELBOW SURGERY Right    torn tendon  . INGUINAL HERNIA REPAIR     right  . KNEE SURGERY     3 on each knee  . SHOULDER ARTHROSCOPY Right 06/15/2015   Procedure: RIGHT SHOULDER ARTHROSCOPY, ACROMIOPLASTY, DEBRIDEMENT;  Surgeon: Melrose Nakayama, MD;  Location: [facility];  Service: Orthopedics;  Laterality: Right;  . shoulder arthrosopy Left   . TONSILLECTOMY AND ADENOIDECTOMY    . TUMOR EXCISION     sebaceous cyst removed from chest  . ZENKER'S DIVERTICULECTOMY ENDOSCOPIC N/A 06/14/2016   Procedure:  ZENKER'S DIVERTICULECTOMY ENDOSCOPIC;  Surgeon: Izora Gala, MD;  [facility];  Service: ENT;  Laterality: N/A;      Current Outpatient Medications:  .  allopurinol (ZYLOPRIM) 100 MG tablet, Take 300 mg by mouth daily., Disp: , Rfl:  .  ARTIFICIAL TEARS 0.2-0.2-1 % SOLN, , Disp: , Rfl:  .  CALCIUM 600 1500 (600 Ca) MG TABS tablet, Take 600 mg of elemental calcium by mouth 2 (two) times daily. , Disp: , Rfl:  .  capsaicin (ZOSTRIX) 0.025 % cream, Apply 1 application topically 2 (two) times daily as needed (pain.)., Disp: , Rfl:  .  cyanocobalamin 2000 MCG tablet, Take 2,000 mcg by mouth daily., Disp: , Rfl:  .  diltiazem (CARDIZEM CD) 120 MG 24 hr capsule, Take 1 capsule (120 mg total) by mouth daily as needed (IF YOUR HEART RATE GOES OVER 100)., Disp: 90 capsule, Rfl: 1 .   famotidine (PEPCID) 20 MG tablet, Take 20 mg by mouth 2 (two) times a day., Disp: , Rfl:  .  hydroxypropyl methylcellulose / hypromellose (ISOPTO TEARS / GONIOVISC) 2.5 % ophthalmic solution, Place 1-2 drops into both eyes 3 (three) times daily as needed (dry/irritated eyes.)., Disp: , Rfl:  .  ibuprofen (ADVIL) 600 MG tablet, , Disp: , Rfl:  .  indomethacin (INDOCIN) 50 MG capsule, , Disp: , Rfl:  .  ipratropium (ATROVENT) 0.03 % nasal spray, Place 1 spray into the nose daily as needed for rhinitis. , Disp: , Rfl:  .  magnesium oxide (MAG-OX) 400 (241.3 Mg) MG tablet, Take 400 mg by mouth 2 (two) times daily. , Disp: , Rfl:  .  metoprolol tartrate (LOPRESSOR) 25 MG tablet, Take 1 tablet (25 mg total) by mouth 2 (two) times daily. Hold SYSTOLIC BP 99991111, HR 123XX123, Disp: 30 tablet, Rfl: 0 .  morphine (MS CONTIN) 15 MG 12 hr tablet, Take 15 mg by mouth every 12 (twelve) hours., Disp: , Rfl:  .  Multiple Vitamin (MULTIVITAMIN) tablet, Take 1 tablet by mouth daily., Disp: , Rfl:  .  oxyCODONE (OXY IR/ROXICODONE) 5 MG immediate release tablet, Take 1 tablet (5 mg total) by mouth every 4 (four) hours as needed for severe pain. In addition to current chronic pain medication (Patient taking differently: Take 5 mg by mouth every 4 (four) hours. ), Disp: 30 tablet, Rfl: 0 .  sildenafil (VIAGRA) 100 MG tablet, Take 100 mg by mouth daily as needed for erectile dysfunction., Disp: , Rfl:  .  TIADYLT ER 240 MG 24 hr capsule, , Disp: , Rfl:  .  vitamin B-12 (CYANOCOBALAMIN) 1000 MCG tablet, , Disp: , Rfl:    Objective:   Vitals:   06/17/19 1309  BP: 127/75  Pulse: 68  Temp: 97.7 F (36.5 C)  SpO2: 99%    Physical Exam Vitals and nursing note reviewed.  Constitutional:      Appearance: Normal appearance.  HENT:     Head: Normocephalic and atraumatic.  Cardiovascular:     Rate and Rhythm: Normal rate.     Pulses: Normal pulses.  Pulmonary:     Effort: Pulmonary effort is normal. No respiratory  distress.  Chest:    Abdominal:     General: Abdomen is flat. There is no distension.  Skin:    General: Skin is warm.     Capillary Refill: Capillary refill takes less than 2 seconds.  Neurological:     General: No focal deficit present.     Mental Status: He is alert and  oriented to person, place, and time.  Psychiatric:        Mood and Affect: Mood normal.        Behavior: Behavior normal.        Thought Content: Thought content normal.     Assessment & Plan:  Breast pain  Acquired absence of nipple, right  I am recommending a staged approach.  First stage release of scar contracture and capsule contracture with release of the nerve on the right breast.  If that improves then we could move towards areola tattooing and nipple creation.  Complete tattooing is also an option.  The patient seems pleased with this idea. Pictures were obtained of the patient and placed in the chart with the patient's or guardian's permission.  Wallace Going, DO  The 21st Century Cures Act was signed into law in 2016 which includes the topic of electronic health records.  This provides immediate access to information in MyChart.  This includes consultation notes, operative notes, office notes, lab results and pathology reports.  If you have any questions about what you read please let us know at your next visit or call us at the office.  We are right here with you.

## 2019-07-11 ENCOUNTER — Ambulatory Visit (INDEPENDENT_AMBULATORY_CARE_PROVIDER_SITE_OTHER): Payer: Medicare Other | Admitting: Podiatry

## 2019-07-11 ENCOUNTER — Other Ambulatory Visit: Payer: Self-pay

## 2019-07-11 DIAGNOSIS — L600 Ingrowing nail: Secondary | ICD-10-CM | POA: Diagnosis not present

## 2019-07-11 DIAGNOSIS — L603 Nail dystrophy: Secondary | ICD-10-CM | POA: Diagnosis not present

## 2019-07-11 NOTE — Patient Instructions (Signed)

## 2019-07-12 ENCOUNTER — Other Ambulatory Visit: Payer: Self-pay | Admitting: General Practice

## 2019-07-15 ENCOUNTER — Encounter: Payer: Self-pay | Admitting: Podiatry

## 2019-07-15 NOTE — Progress Notes (Signed)
Subjective:  Patient ID: [name], male    DOB: 31-Jul-1945,  MRN: [id number]  Chief Complaint  Patient presents with  . Ingrown Toenail    pt is here for bil ingrown toenails, medial to lateral side, pt states there has been no pus or drainage, but states that it is still painful.    74 y.o. male presents with the above complaint.  Patient presents with bilateral hallux lateral border ingrown that has been going on for multiple months.  Pain is worsened when ambulating.  There has been some oozing.  Patient states is painful to touch.  Patient has tried self debridement which has not helped.  no pain on palpation to the lateral border of the nail bilaterally  He denies trying anything else.  He has a history of previous ingrown so were removed but not this border.  He denies any other acute complaints and would like to have it removed.   Review of Systems: Negative except as noted in the HPI. Denies N/V/F/Ch.  Past Medical History:  Diagnosis Date  . Atrial fibrillation with rapid ventricular response ([facility]) 10/25/2018   Successful  DCCV shock x1 200 J 11/04/2018  . Chronic back pain    buldging disc  . Depression   . GERD (gastroesophageal reflux disease)   . GOUT    takes Allopurinol daily and Colchicine as needed  . History of bronchitis    many yrs ago  . History of colonoscopy    benign  . History of shingles   . Joint pain   . Joint swelling   . Muscle spasm    takes Robaxin as needed  . Nocturia   . OSTEOARTHRITIS   . Peripheral edema    takes Lasix daily as needed  . Pneumonia 1969  . Seasonal allergies    takes Zyrtec and uses Flonase is needed  . TESTICULAR HYPOFUNCTION   . Zenker diverticula     Current Outpatient Medications:  .  allopurinol (ZYLOPRIM) 100 MG tablet, Take 300 mg by mouth daily., Disp: , Rfl:  .  ARTIFICIAL TEARS 0.2-0.2-1 % SOLN, , Disp: , Rfl:  .  CALCIUM 600 1500 (600 Ca) MG TABS tablet, Take 600 mg of elemental calcium by mouth 2  (two) times daily. , Disp: , Rfl:  .  capsaicin (ZOSTRIX) 0.025 % cream, Apply 1 application topically 2 (two) times daily as needed (pain.)., Disp: , Rfl:  .  cyanocobalamin 2000 MCG tablet, Take 2,000 mcg by mouth daily., Disp: , Rfl:  .  diltiazem (CARDIZEM CD) 120 MG 24 hr capsule, Take 1 capsule (120 mg total) by mouth daily as needed (IF YOUR HEART RATE GOES OVER 100)., Disp: 90 capsule, Rfl: 1 .  famotidine (PEPCID) 20 MG tablet, Take 20 mg by mouth 2 (two) times a day., Disp: , Rfl:  .  hydroxypropyl methylcellulose / hypromellose (ISOPTO TEARS / GONIOVISC) 2.5 % ophthalmic solution, Place 1-2 drops into both eyes 3 (three) times daily as needed (dry/irritated eyes.)., Disp: , Rfl:  .  ibuprofen (ADVIL) 600 MG tablet, , Disp: , Rfl:  .  indomethacin (INDOCIN) 50 MG capsule, , Disp: , Rfl:  .  ipratropium (ATROVENT) 0.03 % nasal spray, Place 1 spray into the nose daily as needed for rhinitis. , Disp: , Rfl:  .  magnesium oxide (MAG-OX) 400 (241.3 Mg) MG tablet, Take 400 mg by mouth 2 (two) times daily. , Disp: , Rfl:  .  metoprolol tartrate (LOPRESSOR) 25 MG tablet,  Take 1 tablet (25 mg total) by mouth 2 (two) times daily. Hold SYSTOLIC BP 99991111, HR 123XX123, Disp: 30 tablet, Rfl: 0 .  morphine (MS CONTIN) 15 MG 12 hr tablet, Take 15 mg by mouth every 12 (twelve) hours., Disp: , Rfl:  .  Multiple Vitamin (MULTIVITAMIN) tablet, Take 1 tablet by mouth daily., Disp: , Rfl:  .  oxyCODONE (OXY IR/ROXICODONE) 5 MG immediate release tablet, Take 1 tablet (5 mg total) by mouth every 4 (four) hours as needed for severe pain. In addition to current chronic pain medication (Patient taking differently: Take 5 mg by mouth every 4 (four) hours. ), Disp: 30 tablet, Rfl: 0 .  sildenafil (VIAGRA) 100 MG tablet, Take 100 mg by mouth daily as needed for erectile dysfunction., Disp: , Rfl:  .  TIADYLT ER 240 MG 24 hr capsule, , Disp: , Rfl:  .  vitamin B-12 (CYANOCOBALAMIN) 1000 MCG tablet, , Disp: , Rfl:   Social  History   Tobacco Use  Smoking Status Never Smoker  Smokeless Tobacco Never Used    No Known Allergies Objective:  There were no vitals filed for this visit. There is no height or weight on file to calculate BMI. Constitutional Well developed. Well nourished.  Vascular Dorsalis pedis pulses palpable bilaterally. Posterior tibial pulses palpable bilaterally. Capillary refill normal to all digits.  No cyanosis or clubbing noted. Pedal hair growth normal.  Neurologic Normal speech. Oriented to person, place, and time. Epicritic sensation to light touch grossly present bilaterally.  Dermatologic Painful ingrowing nail at lateral nail borders of the hallux nail bilaterally. No other open wounds. No skin lesions.  Orthopedic: Normal joint ROM without pain or crepitus bilaterally. No visible deformities. No bony tenderness.   Radiographs: None Assessment:   1. Nail dystrophy   2. Ingrown toenail of right foot   3. Ingrown toenail of left foot    Plan:  Patient was evaluated and treated and all questions answered.  Ingrown Nail, bilaterally/nail dystrophy -Patient elects to proceed with minor surgery to remove ingrown toenail removal today. Consent reviewed and signed by patient. -Ingrown nail excised. See procedure note. -Educated on post-procedure care including soaking. Written instructions provided and reviewed. -Patient to follow up in 2 weeks for nail check.  Procedure: Excision of Ingrown Toenail Location: Bilateral 1st toe lateral nail borders. Anesthesia: Lidocaine 1% plain; 1.5 mL and Marcaine 0.5% plain; 1.5 mL, digital block. Skin Prep: Betadine. Dressing: Silvadene; telfa; dry, sterile, compression dressing. Technique: Following skin prep, the toe was exsanguinated and a tourniquet was secured at the base of the toe. The affected nail border was freed, split with a nail splitter, and excised. Chemical matrixectomy was then performed with phenol and irrigated out  with alcohol. The tourniquet was then removed and sterile dressing applied. Disposition: Patient tolerated procedure well. Patient to return in 2 weeks for follow-up.   No follow-ups on file.

## 2019-07-18 ENCOUNTER — Telehealth: Payer: Self-pay

## 2019-07-18 ENCOUNTER — Telehealth: Payer: Self-pay | Admitting: Cardiovascular Disease

## 2019-07-18 NOTE — Telephone Encounter (Signed)
New Message   Pt is calling and says recently had left shoulder surgery. He is wondering if he is able to use a  Tens unit -Electronic stimulation because he has had Afib     Please advise

## 2019-07-18 NOTE — Telephone Encounter (Signed)
Patient sent mychart message:  Dr. Gwenlyn Found I have just had surgery to my left shoulder on June 26, 2019.  Dr. Rhona Raider, the surgeon and the therapy specialists want to use a Tens unit to help healing.  Because of my previous atrial fibrillation condition they want to know if the use of the Tens unit is safe? Dr. Rhona [id number] Mine:  [phone] Thank you   Will route to Dr.Berry to advise.  Thanks!

## 2019-07-18 NOTE — Telephone Encounter (Signed)
Sent patient a message back via mychart regarding the message from MD.

## 2019-07-18 NOTE — Telephone Encounter (Signed)
I see no reason why he can't use a TENS unit  JJB

## 2019-07-23 ENCOUNTER — Telehealth: Payer: Self-pay | Admitting: Cardiovascular Disease

## 2019-07-23 NOTE — Telephone Encounter (Signed)
Dora PT at [facility] calling stating the patient was interested in using electrical stimulation for pain management of his left shoulder. She states they need clearance in order to use it since the patient has afib.

## 2019-07-23 NOTE — Telephone Encounter (Signed)
Left message to call office

## 2019-07-24 NOTE — Telephone Encounter (Signed)
Called PT Dora, left message advising that per previous messaging it was okay to use the TENS unit.

## 2019-07-25 ENCOUNTER — Encounter: Payer: Self-pay | Admitting: Podiatry

## 2019-07-25 ENCOUNTER — Ambulatory Visit (INDEPENDENT_AMBULATORY_CARE_PROVIDER_SITE_OTHER): Payer: Medicare Other | Admitting: Podiatry

## 2019-07-25 ENCOUNTER — Other Ambulatory Visit: Payer: Self-pay

## 2019-07-25 DIAGNOSIS — L600 Ingrowing nail: Secondary | ICD-10-CM | POA: Diagnosis not present

## 2019-07-25 DIAGNOSIS — L03031 Cellulitis of right toe: Secondary | ICD-10-CM

## 2019-07-25 MED ORDER — DOXYCYCLINE HYCLATE 100 MG PO TABS: 100.0000 mg | ORAL_TABLET | Freq: Two times a day (BID) | ORAL | 0 refills | Status: DC

## 2019-07-25 NOTE — Progress Notes (Signed)
Subjective: [name] is a 74 y.o.  male returns to office today for follow up evaluation after having right Hallux Lateral border nail avulsion performed. Patient has been soaking using epsom salt and applying topical antibiotic covered with bandaid daily. Patient denies fevers, chills, nausea, vomiting. Denies any calf pain, chest pain, SOB.   Objective:  Vitals: Reviewed  General: Well developed, nourished, in no acute distress, alert and oriented x3   Dermatology: Skin is warm, dry and supple bilateral. Lateral hallux nail border appears to be clean, dry, with mild granular tissue and surrounding scab. There is no surrounding erythema, edema, drainage/purulence. The remaining nails appear unremarkable at this time. There are no other lesions or other signs of infection present.  Neurovascular status: Intact. No lower extremity swelling; No pain with calf compression bilateral.  Musculoskeletal: Decreased tenderness to palpation of the Lateral hallux nail fold(s). Muscular strength within normal limits bilateral.   Assesement and Plan: S/p partial nail avulsion, doing well.   -Doxycycline was dispensed for paronychia the ingrown.  No recurrence of the nail noted. -Continue soaking in epsom salts twice a day followed by antibiotic ointment and a band-aid. Can leave uncovered at night. Continue this until completely healed.  -If the area has not healed in 2 weeks, call the office for follow-up appointment, or sooner if any problems arise.  -Monitor for any signs/symptoms of infection. Call the office immediately if any occur or go directly to the emergency room. Call with any questions/concerns.  Boneta Lucks, DPM

## 2019-07-29 ENCOUNTER — Other Ambulatory Visit: Payer: Self-pay

## 2019-07-29 ENCOUNTER — Encounter: Payer: Self-pay | Admitting: Surgical

## 2019-07-29 ENCOUNTER — Ambulatory Visit (INDEPENDENT_AMBULATORY_CARE_PROVIDER_SITE_OTHER): Payer: Medicare Other | Admitting: Surgical

## 2019-07-29 VITALS — BP 121/66 | HR 82 | Temp 78.5°F | Ht 73.0 in | Wt 214.2 lb

## 2019-07-29 DIAGNOSIS — N644 Mastodynia: Secondary | ICD-10-CM

## 2019-07-29 DIAGNOSIS — Z9011 Acquired absence of right breast and nipple: Secondary | ICD-10-CM

## 2019-07-29 MED ORDER — OXYCODONE HCL 5 MG PO TABS: 5.0000 mg | ORAL_TABLET | Freq: Four times a day (QID) | ORAL | 0 refills | Status: AC | PRN

## 2019-07-29 MED ORDER — CEPHALEXIN 500 MG PO CAPS: 500.0000 mg | ORAL_CAPSULE | Freq: Four times a day (QID) | ORAL | 0 refills | Status: AC

## 2019-07-29 MED ORDER — ONDANSETRON HCL 4 MG PO TABS: 4.0000 mg | ORAL_TABLET | Freq: Three times a day (TID) | ORAL | 0 refills | Status: DC | PRN

## 2019-07-29 NOTE — Progress Notes (Signed)
Patient ID: [name], male    DOB: 23-Jan-1946, 74 y.o.   MRN: [id number]  Chief Complaint  Patient presents with  . Pre-op Exam    scar and capsule release of (R) breast tissue     ICD-10-CM   1. Breast pain  N64.4   2. Acquired absence of nipple, right  Z90.11     History of Present Illness: [name] is a 74 y.o.  male  with a history of excision of right breast sebaceous cyst in 2014. He had removal of NAC on this right side.  He presents for preoperative evaluation for upcoming procedure, scar contracture and capsule release of right breast tissue, scheduled for 08/20/19 with Dr. Marla Roe.  The patient has not had problems with anesthesia.  No history or family history of DVT/PE.  No family or personal history of bleeding or clotting disorders.  Patient is not currently taking any blood thinners.  He is a non-smoker.  He does not have any current shortness of breath at rest or with exertion, no chest pain, no dizziness, no weakness no fatigue no vision changes.  Patient currently undergoing physical therapy for post surgical protocol after rotator cuff repair approximately 1 month ago.  He reports he is doing well.  He is going to PT today after his appointment with me.  Patient has a past medical history of chronic venous insufficiency, acute on chronic combined systolic and diastolic heart failure, atrial fibrillation with RVR, osteoarthritis, gout, low back pain, generalized weakness.  Patient is currently managed by [facility] [place] [facility] for his atrial fibrillation and aflutter.  Patient underwent successful cardioversion on 11/21/2018.  Patient was then seen in the office again on 02/21/2019 and was noted to be in atrial flutter, asymptomatic.  Patient had a CHA2DS2-VASc score of 1 during that time and it was decided to stop Eliquis.   Patient recently cleared by cardiology in March 2021 for left shoulder arthroscopy and rotator cuff repair.  Past Medical  History: Allergies: No Known Allergies  Current Medications:  Current Outpatient Medications:  .  allopurinol (ZYLOPRIM) 100 MG tablet, Take 300 mg by mouth daily., Disp: , Rfl:  .  ARTIFICIAL TEARS 0.2-0.2-1 % SOLN, , Disp: , Rfl:  .  CALCIUM 600 1500 (600 Ca) MG TABS tablet, Take 600 mg of elemental calcium by mouth 2 (two) times daily. , Disp: , Rfl:  .  capsaicin (ZOSTRIX) 0.025 % cream, Apply 1 application topically 2 (two) times daily as needed (pain.)., Disp: , Rfl:  .  cyanocobalamin 2000 MCG tablet, Take 2,000 mcg by mouth daily., Disp: , Rfl:  .  diltiazem (CARDIZEM CD) 120 MG 24 hr capsule, Take 1 capsule (120 mg total) by mouth daily as needed (IF YOUR HEART RATE GOES OVER 100)., Disp: 90 capsule, Rfl: 1 .  doxycycline (VIBRA-TABS) 100 MG tablet, Take 1 tablet (100 mg total) by mouth 2 (two) times daily., Disp: 20 tablet, Rfl: 0 .  famotidine (PEPCID) 20 MG tablet, Take 20 mg by mouth 2 (two) times a day., Disp: , Rfl:  .  furosemide (LASIX) 20 MG tablet, Take 20 mg by mouth daily., Disp: , Rfl:  .  hydroxypropyl methylcellulose / hypromellose (ISOPTO TEARS / GONIOVISC) 2.5 % ophthalmic solution, Place 1-2 drops into both eyes 3 (three) times daily as needed (dry/irritated eyes.)., Disp: , Rfl:  .  ibuprofen (ADVIL) 600 MG tablet, , Disp: , Rfl:  .  indomethacin (INDOCIN) 50 MG capsule, ,  Disp: , Rfl:  .  ipratropium (ATROVENT) 0.03 % nasal spray, Place 1 spray into the nose daily as needed for rhinitis. , Disp: , Rfl:  .  magnesium oxide (MAG-OX) 400 (241.3 Mg) MG tablet, Take 400 mg by mouth 2 (two) times daily. , Disp: , Rfl:  .  magnesium oxide (MAG-OX) 400 MG tablet, Take 1 tablet by mouth 2 (two) times daily., Disp: , Rfl:  .  metoprolol tartrate (LOPRESSOR) 25 MG tablet, Take 1 tablet (25 mg total) by mouth 2 (two) times daily. Hold SYSTOLIC BP 99991111, HR 123XX123, Disp: 30 tablet, Rfl: 0 .  morphine (MS CONTIN) 15 MG 12 hr tablet, Take 15 mg by mouth every 12 (twelve) hours.,  Disp: , Rfl:  .  Multiple Vitamin (MULTIVITAMIN) tablet, Take 1 tablet by mouth daily., Disp: , Rfl:  .  oxyCODONE (OXY IR/ROXICODONE) 5 MG immediate release tablet, Take 1 tablet (5 mg total) by mouth every 4 (four) hours as needed for severe pain. In addition to current chronic pain medication (Patient taking differently: Take 5 mg by mouth every 4 (four) hours. ), Disp: 30 tablet, Rfl: 0 .  potassium chloride SA (KLOR-CON) 20 MEQ tablet, Take 20 mEq by mouth daily., Disp: , Rfl:  .  sildenafil (VIAGRA) 100 MG tablet, Take 100 mg by mouth daily as needed for erectile dysfunction., Disp: , Rfl:  .  TIADYLT ER 240 MG 24 hr capsule, , Disp: , Rfl:  .  vitamin B-12 (CYANOCOBALAMIN) 1000 MCG tablet, , Disp: , Rfl:   Past Medical Problems: Past Medical History:  Diagnosis Date  . Atrial fibrillation with rapid ventricular response ([place]) 10/25/2018   Successful  DCCV shock x1 200 J 11/04/2018  . Chronic back pain    buldging disc  . Depression   . GERD (gastroesophageal reflux disease)   . GOUT    takes Allopurinol daily and Colchicine as needed  . History of bronchitis    many yrs ago  . History of colonoscopy    benign  . History of shingles   . Joint pain   . Joint swelling   . Muscle spasm    takes Robaxin as needed  . Nocturia   . OSTEOARTHRITIS   . Peripheral edema    takes Lasix daily as needed  . Pneumonia 1969  . Seasonal allergies    takes Zyrtec and uses Flonase is needed  . TESTICULAR HYPOFUNCTION   . Zenker diverticula     Past Surgical History: Past Surgical History:  Procedure Laterality Date  . ANKLE SURGERY     4 times  . CARDIOVERSION N/A 11/04/2018   Procedure: CARDIOVERSION;  Surgeon: Acie Fredrickson Wonda Cheng, MD;  Location: [place];  Service: Cardiovascular;  Laterality: N/A;  . COLONOSCOPY    . ELBOW SURGERY Right    torn tendon  . INGUINAL HERNIA REPAIR     right  . KNEE SURGERY     3 on each knee  . SHOULDER ARTHROSCOPY Right 06/15/2015   Procedure:  RIGHT SHOULDER ARTHROSCOPY, ACROMIOPLASTY, DEBRIDEMENT;  Surgeon: Melrose Nakayama, MD;  Location: [place];  Service: Orthopedics;  Laterality: Right;  . shoulder arthrosopy Left   . TONSILLECTOMY AND ADENOIDECTOMY    . TUMOR EXCISION     sebaceous cyst removed from chest  . ZENKER'S DIVERTICULECTOMY ENDOSCOPIC N/A 06/14/2016   Procedure: Stanford Breed DIVERTICULECTOMY ENDOSCOPIC;  Surgeon: Izora Gala, MD;  Location[facility];  Service: ENT;  Laterality: N/A;    Social History: Social History  Socioeconomic History  . Marital status: Married    Spouse name: Not on file  . Number of children: Not on file  . Years of education: Not on file  . Highest education level: Not on file  Occupational History  . Not on file  Tobacco Use  . Smoking status: Never Smoker  . Smokeless tobacco: Never Used  Substance and Sexual Activity  . Alcohol use: Yes    Alcohol/week: 22.0 standard drinks    Types: 10 Standard drinks or equivalent, 12 Cans of beer per week    Comment: daily  . Drug use: No  . Sexual activity: Not on file  Other Topics Concern  . Not on file  Social History Narrative  . Not on file   Social Determinants of Health   Financial Resource Strain:   . Difficulty of Paying Living Expenses:   Food Insecurity:   . Worried About Charity fundraiser in the Last Year:   . Arboriculturist in the Last Year:   Transportation Needs:   . Film/video editor (Medical):   Marland Kitchen Lack of Transportation (Non-Medical):   Physical Activity:   . Days of Exercise per Week:   . Minutes of Exercise per Session:   Stress:   . Feeling of Stress :   Social Connections:   . Frequency of Communication with Friends and Family:   . Frequency of Social Gatherings with Friends and Family:   . Attends Religious Services:   . Active Member of Clubs or Organizations:   . Attends Archivist Meetings:   Marland Kitchen Marital Status:   Intimate Partner Violence:   . Fear of Current or Ex-Partner:   .  Emotionally Abused:   Marland Kitchen Physically Abused:   . Sexually Abused:     Family History: Family History  Family history unknown: Yes    Review of Systems: Review of Systems  Constitutional: Negative for chills, diaphoresis, fever and malaise/fatigue.  Respiratory: Negative.   Cardiovascular: Negative for chest pain, palpitations and leg swelling.  Gastrointestinal: Negative.   Neurological: Negative for dizziness, focal weakness and weakness.    Physical Exam: Vital Signs BP 121/66 (BP Location: Right Arm, Patient Position: Sitting, Cuff Size: Large)   Pulse 82   Temp (!) 78.5 F (25.8 C) (Temporal)   Ht 6\' 1"  (1.854 m)   Wt 214 lb 3.2 oz (97.2 kg)   SpO2 98%   BMI 28.26 kg/m   Physical Exam Constitutional:      General: He is not in acute distress.    Appearance: Normal appearance. He is not ill-appearing.  HENT:     Head: Normocephalic and atraumatic.  Eyes:     Conjunctiva/sclera: Conjunctivae normal.  Cardiovascular:     Rate and Rhythm: Normal rate. Rhythm irregular.     Pulses: Normal pulses.     Heart sounds: Normal heart sounds. No murmur.  Pulmonary:     Effort: Pulmonary effort is normal. No respiratory distress.     Breath sounds: Normal breath sounds. No wheezing.  Chest:    Abdominal:     General: Abdomen is flat.  Neurological:     General: No focal deficit present.     Mental Status: He is alert and oriented to person, place, and time.  Psychiatric:        Mood and Affect: Mood normal.        Behavior: Behavior normal.      Assessment/Plan: The patient is scheduled for scar  contracture and capsule release of right breast tissue with Dr. Marla Roe on 08/20/19.  Risks, benefits, and alternatives of procedure discussed, questions answered and consent obtained.    Patient provided with general consent form covering surgical risks associated with general surgical interventions. Patient was allocated adequate time prior to appointment to read  through, initial and sign that they agree that they are aware of the risks associated. We also covered the risks in person during this pre-op appointment. I answered all of the patient's questions to their content and informed them to call with any further questions or concerns prior to surgery and I would be happy to discuss with them further.  We discussed expected postop course, discussed the risks associated with possible need for additional procedures due to scar tissue formation, discussed risks include but not limited to: Bleeding, infection, damage to surrounding structures, need for additional procedures, poor wound healing, scarring, risk of surgical anesthesia, risk of blood clots (DVT/PE), cardiac and pulmonary complications, allergic reactions.  Smoking status: non smoker Caprini score: 4, moderate risk, BMI greater than 103, 74 years old, minor surgery.  Recommend mechanical prophylaxis and early ambulation.  Patient is able to perform greater than 4 METS without shortness of breath or any symptoms.  Prior to orthopedic surgery of left shoulder he was lifting weights.  He is able to climb stairs without shortness of breath. Patient recently cleared by cardiology for rotator cuff repair.  Patient is scheduled to see his cardiologist on May 5 for evaluation.  Recommend patient discuss surgery with cardiology.  Rx sent to pharmacy for post-op pain, PONV, ppx abx coverage. Patient has chronic pain and takes oxycodone 5 mg and morphine p.o. daily.  We had a discussion about taking postop pain meds for postop pain and not taking his chronic pain medications in conjunction with postop pain medications.  Patient in agreement with current plan.    Call with questions or concerns.   Electronically signed by: Carola Rhine Marvie Calender, PA-C 07/29/2019 3:04 PM

## 2019-07-29 NOTE — H&P (View-Only) (Signed)
Patient ID: [name], male    DOB: 1945-08-29, 74 y.o.   MRN: [id number]  Chief Complaint  Patient presents with  . Pre-op Exam    scar and capsule release of (R) breast tissue     ICD-10-CM   1. Breast pain  N64.4   2. Acquired absence of nipple, right  Z90.11     History of Present Illness: [name] is a 74 y.o.  male  with a history of excision of right breast sebaceous cyst in 2014. He had removal of NAC on this right side.  He presents for preoperative evaluation for upcoming procedure, scar contracture and capsule release of right breast tissue, scheduled for 08/20/19 with Dr. Marla Roe.  The patient has not had problems with anesthesia.  No history or family history of DVT/PE.  No family or personal history of bleeding or clotting disorders.  Patient is not currently taking any blood thinners.  He is a non-smoker.  He does not have any current shortness of breath at rest or with exertion, no chest pain, no dizziness, no weakness no fatigue no vision changes.  Patient currently undergoing physical therapy for post surgical protocol after rotator cuff repair approximately 1 month ago.  He reports he is doing well.  He is going to PT today after his appointment with me.  Patient has a past medical history of chronic venous insufficiency, acute on chronic combined systolic and diastolic heart failure, atrial fibrillation with RVR, osteoarthritis, gout, low back pain, generalized weakness.  Patient is currently managed by [place] [facility] for his atrial fibrillation and aflutter.  Patient underwent successful cardioversion on 11/21/2018.  Patient was then seen in the office again on 02/21/2019 and was noted to be in atrial flutter, asymptomatic.  Patient had a CHA2DS2-VASc score of 1 during that time and it was decided to stop Eliquis.   Patient recently cleared by cardiology in March 2021 for left shoulder arthroscopy and rotator cuff repair.  Past Medical  History: Allergies: No Known Allergies  Current Medications:  Current Outpatient Medications:  .  allopurinol (ZYLOPRIM) 100 MG tablet, Take 300 mg by mouth daily., Disp: , Rfl:  .  ARTIFICIAL TEARS 0.2-0.2-1 % SOLN, , Disp: , Rfl:  .  CALCIUM 600 1500 (600 Ca) MG TABS tablet, Take 600 mg of elemental calcium by mouth 2 (two) times daily. , Disp: , Rfl:  .  capsaicin (ZOSTRIX) 0.025 % cream, Apply 1 application topically 2 (two) times daily as needed (pain.)., Disp: , Rfl:  .  cyanocobalamin 2000 MCG tablet, Take 2,000 mcg by mouth daily., Disp: , Rfl:  .  diltiazem (CARDIZEM CD) 120 MG 24 hr capsule, Take 1 capsule (120 mg total) by mouth daily as needed (IF YOUR HEART RATE GOES OVER 100)., Disp: 90 capsule, Rfl: 1 .  doxycycline (VIBRA-TABS) 100 MG tablet, Take 1 tablet (100 mg total) by mouth 2 (two) times daily., Disp: 20 tablet, Rfl: 0 .  famotidine (PEPCID) 20 MG tablet, Take 20 mg by mouth 2 (two) times a day., Disp: , Rfl:  .  furosemide (LASIX) 20 MG tablet, Take 20 mg by mouth daily., Disp: , Rfl:  .  hydroxypropyl methylcellulose / hypromellose (ISOPTO TEARS / GONIOVISC) 2.5 % ophthalmic solution, Place 1-2 drops into both eyes 3 (three) times daily as needed (dry/irritated eyes.)., Disp: , Rfl:  .  ibuprofen (ADVIL) 600 MG tablet, , Disp: , Rfl:  .  indomethacin (INDOCIN) 50 MG capsule, ,  Disp: , Rfl:  .  ipratropium (ATROVENT) 0.03 % nasal spray, Place 1 spray into the nose daily as needed for rhinitis. , Disp: , Rfl:  .  magnesium oxide (MAG-OX) 400 (241.3 Mg) MG tablet, Take 400 mg by mouth 2 (two) times daily. , Disp: , Rfl:  .  magnesium oxide (MAG-OX) 400 MG tablet, Take 1 tablet by mouth 2 (two) times daily., Disp: , Rfl:  .  metoprolol tartrate (LOPRESSOR) 25 MG tablet, Take 1 tablet (25 mg total) by mouth 2 (two) times daily. Hold SYSTOLIC BP 99991111, HR 123XX123, Disp: 30 tablet, Rfl: 0 .  morphine (MS CONTIN) 15 MG 12 hr tablet, Take 15 mg by mouth every 12 (twelve) hours.,  Disp: , Rfl:  .  Multiple Vitamin (MULTIVITAMIN) tablet, Take 1 tablet by mouth daily., Disp: , Rfl:  .  oxyCODONE (OXY IR/ROXICODONE) 5 MG immediate release tablet, Take 1 tablet (5 mg total) by mouth every 4 (four) hours as needed for severe pain. In addition to current chronic pain medication (Patient taking differently: Take 5 mg by mouth every 4 (four) hours. ), Disp: 30 tablet, Rfl: 0 .  potassium chloride SA (KLOR-CON) 20 MEQ tablet, Take 20 mEq by mouth daily., Disp: , Rfl:  .  sildenafil (VIAGRA) 100 MG tablet, Take 100 mg by mouth daily as needed for erectile dysfunction., Disp: , Rfl:  .  TIADYLT ER 240 MG 24 hr capsule, , Disp: , Rfl:  .  vitamin B-12 (CYANOCOBALAMIN) 1000 MCG tablet, , Disp: , Rfl:   Past Medical Problems: Past Medical History:  Diagnosis Date  . Atrial fibrillation with rapid ventricular response ([place]) 10/25/2018   Successful  DCCV shock x1 200 J 11/04/2018  . Chronic back pain    buldging disc  . Depression   . GERD (gastroesophageal reflux disease)   . GOUT    takes Allopurinol daily and Colchicine as needed  . History of bronchitis    many yrs ago  . History of colonoscopy    benign  . History of shingles   . Joint pain   . Joint swelling   . Muscle spasm    takes Robaxin as needed  . Nocturia   . OSTEOARTHRITIS   . Peripheral edema    takes Lasix daily as needed  . Pneumonia 1969  . Seasonal allergies    takes Zyrtec and uses Flonase is needed  . TESTICULAR HYPOFUNCTION   . Zenker diverticula     Past Surgical History: Past Surgical History:  Procedure Laterality Date  . ANKLE SURGERY     4 times  . CARDIOVERSION N/A 11/04/2018   Procedure: CARDIOVERSION;  Surgeon: Acie Fredrickson Wonda Cheng, MD;  Location: [place];  Service: Cardiovascular;  Laterality: N/A;  . COLONOSCOPY    . ELBOW SURGERY Right    torn tendon  . INGUINAL HERNIA REPAIR     right  . KNEE SURGERY     3 on each knee  . SHOULDER ARTHROSCOPY Right 06/15/2015   Procedure:  RIGHT SHOULDER ARTHROSCOPY, ACROMIOPLASTY, DEBRIDEMENT;  Surgeon: Melrose Nakayama, MD;  Location: [place];  Service: Orthopedics;  Laterality: Right;  . shoulder arthrosopy Left   . TONSILLECTOMY AND ADENOIDECTOMY    . TUMOR EXCISION     sebaceous cyst removed from chest  . ZENKER'S DIVERTICULECTOMY ENDOSCOPIC N/A 06/14/2016   Procedure: Stanford Breed DIVERTICULECTOMY ENDOSCOPIC;  Surgeon: Izora Gala, MD;  [facility];  Service: ENT;  Laterality: N/A;    Social History: Social History  Socioeconomic History  . Marital status: Married    Spouse name: Not on file  . Number of children: Not on file  . Years of education: Not on file  . Highest education level: Not on file  Occupational History  . Not on file  Tobacco Use  . Smoking status: Never Smoker  . Smokeless tobacco: Never Used  Substance and Sexual Activity  . Alcohol use: Yes    Alcohol/week: 22.0 standard drinks    Types: 10 Standard drinks or equivalent, 12 Cans of beer per week    Comment: daily  . Drug use: No  . Sexual activity: Not on file  Other Topics Concern  . Not on file  Social History Narrative  . Not on file   Social Determinants of Health   Financial Resource Strain:   . Difficulty of Paying Living Expenses:   Food Insecurity:   . Worried About Charity fundraiser in the Last Year:   . Arboriculturist in the Last Year:   Transportation Needs:   . Film/video editor (Medical):   Marland Kitchen Lack of Transportation (Non-Medical):   Physical Activity:   . Days of Exercise per Week:   . Minutes of Exercise per Session:   Stress:   . Feeling of Stress :   Social Connections:   . Frequency of Communication with Friends and Family:   . Frequency of Social Gatherings with Friends and Family:   . Attends Religious Services:   . Active Member of Clubs or Organizations:   . Attends Archivist Meetings:   Marland Kitchen Marital Status:   Intimate Partner Violence:   . Fear of Current or Ex-Partner:   .  Emotionally Abused:   Marland Kitchen Physically Abused:   . Sexually Abused:     Family History: Family History  Family history unknown: Yes    Review of Systems: Review of Systems  Constitutional: Negative for chills, diaphoresis, fever and malaise/fatigue.  Respiratory: Negative.   Cardiovascular: Negative for chest pain, palpitations and leg swelling.  Gastrointestinal: Negative.   Neurological: Negative for dizziness, focal weakness and weakness.    Physical Exam: Vital Signs BP 121/66 (BP Location: Right Arm, Patient Position: Sitting, Cuff Size: Large)   Pulse 82   Temp (!) 78.5 F (25.8 C) (Temporal)   Ht 6\' 1"  (1.854 m)   Wt 214 lb 3.2 oz (97.2 kg)   SpO2 98%   BMI 28.26 kg/m   Physical Exam Constitutional:      General: He is not in acute distress.    Appearance: Normal appearance. He is not ill-appearing.  HENT:     Head: Normocephalic and atraumatic.  Eyes:     Conjunctiva/sclera: Conjunctivae normal.  Cardiovascular:     Rate and Rhythm: Normal rate. Rhythm irregular.     Pulses: Normal pulses.     Heart sounds: Normal heart sounds. No murmur.  Pulmonary:     Effort: Pulmonary effort is normal. No respiratory distress.     Breath sounds: Normal breath sounds. No wheezing.  Chest:    Abdominal:     General: Abdomen is flat.  Neurological:     General: No focal deficit present.     Mental Status: He is alert and oriented to person, place, and time.  Psychiatric:        Mood and Affect: Mood normal.        Behavior: Behavior normal.      Assessment/Plan: The patient is scheduled for scar  contracture and capsule release of right breast tissue with Dr. Marla Roe on 08/20/19.  Risks, benefits, and alternatives of procedure discussed, questions answered and consent obtained.    Patient provided with general consent form covering surgical risks associated with general surgical interventions. Patient was allocated adequate time prior to appointment to read  through, initial and sign that they agree that they are aware of the risks associated. We also covered the risks in person during this pre-op appointment. I answered all of the patient's questions to their content and informed them to call with any further questions or concerns prior to surgery and I would be happy to discuss with them further.  We discussed expected postop course, discussed the risks associated with possible need for additional procedures due to scar tissue formation, discussed risks include but not limited to: Bleeding, infection, damage to surrounding structures, need for additional procedures, poor wound healing, scarring, risk of surgical anesthesia, risk of blood clots (DVT/PE), cardiac and pulmonary complications, allergic reactions.  Smoking status: non smoker Caprini score: 4, moderate risk, BMI greater than 85, 74 years old, minor surgery.  Recommend mechanical prophylaxis and early ambulation.  Patient is able to perform greater than 4 METS without shortness of breath or any symptoms.  Prior to orthopedic surgery of left shoulder he was lifting weights.  He is able to climb stairs without shortness of breath. Patient recently cleared by cardiology for rotator cuff repair.  Patient is scheduled to see his cardiologist on May 5 for evaluation.  Recommend patient discuss surgery with cardiology.  Rx sent to pharmacy for post-op pain, PONV, ppx abx coverage. Patient has chronic pain and takes oxycodone 5 mg and morphine p.o. daily.  We had a discussion about taking postop pain meds for postop pain and not taking his chronic pain medications in conjunction with postop pain medications.  Patient in agreement with current plan.    Call with questions or concerns.   Electronically signed by: Carola Rhine Timm Bonenberger, PA-C 07/29/2019 3:04 PM

## 2019-07-30 ENCOUNTER — Telehealth: Payer: Self-pay

## 2019-07-30 NOTE — Telephone Encounter (Signed)
Faxed cardiac/surgical clearance to [facility]

## 2019-07-30 NOTE — Telephone Encounter (Signed)
Pt has appt with Dr. Gwenlyn Found 08/05/19. I will forward clearance notes to MD for upcoming appt. I will fax notes as FYI to surgeon office. I will remove from the pre op call back pool.

## 2019-07-30 NOTE — Telephone Encounter (Signed)
   Primary Cardiologist:Jonathan Gwenlyn Found, MD  Chart reviewed as part of pre-operative protocol coverage. Patient has appt scheduled with Dr. Gwenlyn Found on 08/05/19 at which time his pre op clearance can be addressed and finalized. Pre op clearance note added to appt notes.   Will route to surgeon to make them aware.   Callback please call the patient to inform to discuss pre op clearance at appt next week with Dr. Gwenlyn Found.    Reino Bellis, NP 07/30/2019, 2:49 PM

## 2019-07-30 NOTE — Telephone Encounter (Signed)
   [facility] Pre-operative Risk Assessment    Request for surgical clearance:  1. What type of surgery is being performed? Scar contracture and capsule release of right breast tissue  2. When is this surgery scheduled? 08/20/19  3. What type of clearance is required (medical clearance vs. Pharmacy clearance to hold med vs. Both)? both  4. Are there any medications that need to be held prior to surgery and how long? unknown  5. Practice name and name of physician performing surgery? [facility]  6. What is your office phone number [phone]   7.   What is your office fax number [phone]  8.   Anesthesia type (None, local, MAC, general) ? general   [name] [name] 07/30/2019, 12:26 PM  _________________________________________________________________   (provider comments below)

## 2019-08-05 ENCOUNTER — Ambulatory Visit (INDEPENDENT_AMBULATORY_CARE_PROVIDER_SITE_OTHER): Payer: Medicare Other | Admitting: Cardiovascular Disease

## 2019-08-05 ENCOUNTER — Encounter: Payer: Self-pay | Admitting: Cardiovascular Disease

## 2019-08-05 ENCOUNTER — Other Ambulatory Visit: Payer: Self-pay

## 2019-08-05 VITALS — BP 148/88 | HR 81 | Ht 73.0 in | Wt 207.2 lb

## 2019-08-05 DIAGNOSIS — I4891 Unspecified atrial fibrillation: Secondary | ICD-10-CM | POA: Diagnosis not present

## 2019-08-05 DIAGNOSIS — I483 Typical atrial flutter: Secondary | ICD-10-CM | POA: Diagnosis not present

## 2019-08-05 DIAGNOSIS — I5043 Acute on chronic combined systolic (congestive) and diastolic (congestive) heart failure: Secondary | ICD-10-CM

## 2019-08-05 NOTE — Progress Notes (Signed)
08/05/2019 [name]   September 21, 1945  [id number]  Primary Physician Administration, [facility] Primary Cardiologist: Lorretta Harp MD Garret Reddish, [place], [place]  HPI:  [name] is a 74 y.o.  mildly overweight married Caucasian male father of 2, grandfather of 1 grandchild who I initially saw in consultation 02/04/2019 when he was admitted with diastolic heart failure and A. fib with RVR.  He is retired from the [employer].  He does not smoke.  He drinks daily but only 2 beers a night and occasional wine with dinner.  He has no history of diabetes, hypertension or hyperlipidemia.  Never had a heart attack or stroke.  There is no family's for heart disease.  He is fairly active.  He did play division 3 football and hockey in his younger years.  He is unaware that he is in A. fib at this point.    Since I saw him 6 months ago he continues to do well.  Is completely asymptomatic from the point of view of his heart.  He did have left shoulder surgery on 3/25 which she is recuperating from.  He denies chest pain or shortness of breath.   Current Meds  Medication Sig  . allopurinol (ZYLOPRIM) 100 MG tablet Take 300 mg by mouth daily.  . ARTIFICIAL TEARS 0.2-0.2-1 % SOLN   . CALCIUM 600 1500 (600 Ca) MG TABS tablet Take 600 mg of elemental calcium by mouth 2 (two) times daily.   . capsaicin (ZOSTRIX) 0.025 % cream Apply 1 application topically 2 (two) times daily as needed (pain.).  Marland Kitchen cyanocobalamin 2000 MCG tablet Take 2,000 mcg by mouth daily.  Marland Kitchen diltiazem (CARDIZEM CD) 120 MG 24 hr capsule Take 1 capsule (120 mg total) by mouth daily as needed (IF YOUR HEART RATE GOES OVER 100).  Marland Kitchen doxycycline (VIBRA-TABS) 100 MG tablet Take 1 tablet (100 mg total) by mouth 2 (two) times daily.  . famotidine (PEPCID) 20 MG tablet Take 20 mg by mouth 2 (two) times a day.  . furosemide (LASIX) 20 MG tablet Take 20 mg by mouth daily.  . hydroxypropyl methylcellulose / hypromellose (ISOPTO TEARS / GONIOVISC)  2.5 % ophthalmic solution Place 1-2 drops into both eyes 3 (three) times daily as needed (dry/irritated eyes.).  Marland Kitchen ibuprofen (ADVIL) 600 MG tablet   . indomethacin (INDOCIN) 50 MG capsule   . ipratropium (ATROVENT) 0.03 % nasal spray Place 1 spray into the nose daily as needed for rhinitis.   . magnesium oxide (MAG-OX) 400 (241.3 Mg) MG tablet Take 400 mg by mouth 2 (two) times daily.   . magnesium oxide (MAG-OX) 400 MG tablet Take 1 tablet by mouth 2 (two) times daily.  . metoprolol tartrate (LOPRESSOR) 25 MG tablet Take 1 tablet (25 mg total) by mouth 2 (two) times daily. Hold SYSTOLIC BP 99991111, HR [id number]  . morphine (MS CONTIN) 15 MG 12 hr tablet Take 15 mg by mouth every 12 (twelve) hours.  . Multiple Vitamin (MULTIVITAMIN) tablet Take 1 tablet by mouth daily.  Marland Kitchen oxyCODONE (OXY IR/ROXICODONE) 5 MG immediate release tablet Take 1 tablet (5 mg total) by mouth every 4 (four) hours as needed for severe pain. In addition to current chronic pain medication (Patient taking differently: Take 5 mg by mouth every 4 (four) hours. )  . potassium chloride SA (KLOR-CON) 20 MEQ tablet Take 20 mEq by mouth daily.  . sildenafil (VIAGRA) 100 MG tablet Take 100 mg by mouth daily as needed for  erectile dysfunction.  Deborah Chalk ER 240 MG 24 hr capsule   . vitamin B-12 (CYANOCOBALAMIN) 1000 MCG tablet      No Known Allergies  Social History   Socioeconomic History  . Marital status: Married    Spouse name: Not on file  . Number of children: Not on file  . Years of education: Not on file  . Highest education level: Not on file  Occupational History  . Not on file  Tobacco Use  . Smoking status: Never Smoker  . Smokeless tobacco: Never Used  Substance and Sexual Activity  . Alcohol use: Yes    Alcohol/week: 22.0 standard drinks    Types: 10 Standard drinks or equivalent, 12 Cans of beer per week    Comment: daily  . Drug use: No  . Sexual activity: Not on file  Other Topics Concern  . Not on file   Social History Narrative  . Not on file   Social Determinants of Health   Financial Resource Strain:   . Difficulty of Paying Living Expenses:   Food Insecurity:   . Worried About Charity fundraiser in the Last Year:   . Arboriculturist in the Last Year:   Transportation Needs:   . Film/video editor (Medical):   Marland Kitchen Lack of Transportation (Non-Medical):   Physical Activity:   . Days of Exercise per Week:   . Minutes of Exercise per Session:   Stress:   . Feeling of Stress :   Social Connections:   . Frequency of Communication with Friends and Family:   . Frequency of Social Gatherings with Friends and Family:   . Attends Religious Services:   . Active Member of Clubs or Organizations:   . Attends Archivist Meetings:   Marland Kitchen Marital Status:   Intimate Partner Violence:   . Fear of Current or Ex-Partner:   . Emotionally Abused:   Marland Kitchen Physically Abused:   . Sexually Abused:      Review of Systems: General: negative for chills, fever, night sweats or weight changes.  Cardiovascular: negative for chest pain, dyspnea on exertion, edema, orthopnea, palpitations, paroxysmal nocturnal dyspnea or shortness of breath Dermatological: negative for rash Respiratory: negative for cough or wheezing Urologic: negative for hematuria Abdominal: negative for nausea, vomiting, diarrhea, bright red blood per rectum, melena, or hematemesis Neurologic: negative for visual changes, syncope, or dizziness All other systems reviewed and are otherwise negative except as noted above.    Blood pressure (!) 148/88, pulse 81, height 6\' 1"  (1.854 m), weight 207 lb 3.2 oz (94 kg).  General appearance: alert and no distress Neck: no adenopathy, no carotid bruit, no JVD, supple, symmetrical, trachea midline and thyroid not enlarged, symmetric, no tenderness/mass/nodules Lungs: clear to auscultation bilaterally Heart: irregularly irregular rhythm Extremities: extremities normal, atraumatic, no  cyanosis or edema Pulses: 2+ and symmetric Skin: Skin color, texture, turgor normal. No rashes or lesions Neurologic: Alert and oriented X 3, normal strength and tone. Normal symmetric reflexes. Normal coordination and gait  EKG atrial flutter with variable AV block and ventricular spots of 81.  I personally reviewed this EKG.  ASSESSMENT AND PLAN:   Atrial fibrillation with RVR [facility]) Mr. Esmeralda Links has chronic a flutter with controlled ventricular response.  He is CHAdsVASc score is 1 and therefore he is not on oral anticoagulation.  Acute on chronic combined systolic and diastolic CHF (congestive heart failure) (HCC) History of chronic diastolic heart failure with preserved EF currently on oral  diuretics.      Lorretta Harp MD FACP,FACC,FAHA, [facility] 08/05/2019 2:28 PM

## 2019-08-05 NOTE — Assessment & Plan Note (Signed)
History of chronic diastolic heart failure with preserved EF currently on oral diuretics.

## 2019-08-05 NOTE — Assessment & Plan Note (Signed)
[name] has chronic a flutter with controlled ventricular response.  He is CHAdsVASc score is 1 and therefore he is not on oral anticoagulation.

## 2019-08-05 NOTE — Patient Instructions (Signed)

## 2019-08-13 ENCOUNTER — Other Ambulatory Visit: Payer: Self-pay

## 2019-08-13 ENCOUNTER — Encounter (HOSPITAL_BASED_OUTPATIENT_CLINIC_OR_DEPARTMENT_OTHER): Payer: Self-pay | Admitting: Plastic Surgery

## 2019-08-13 NOTE — Progress Notes (Signed)
Reviewed with Dr Linna Caprice. Ok to proceed with scheduled surgery

## 2019-08-14 ENCOUNTER — Encounter (HOSPITAL_BASED_OUTPATIENT_CLINIC_OR_DEPARTMENT_OTHER)
Admission: RE | Admit: 2019-08-14 | Discharge: 2019-08-14 | Disposition: A | Discharge status: Home / Self Care | Payer: Medicare Other | Source: Ambulatory Visit | Attending: Plastic Surgery | Admitting: Plastic Surgery

## 2019-08-14 ENCOUNTER — Telehealth: Payer: Self-pay | Admitting: Surgical

## 2019-08-14 DIAGNOSIS — Z01812 Encounter for preprocedural laboratory examination: Secondary | ICD-10-CM | POA: Insufficient documentation

## 2019-08-14 LAB — BASIC METABOLIC PANEL
Anion gap: 6 (ref 5–15)
BUN: 13 mg/dL (ref 8–23)
CO2: 30 mmol/L (ref 22–32)
Calcium: 8.9 mg/dL (ref 8.9–10.3)
Chloride: 103 mmol/L (ref 98–111)
Creatinine, Ser: 0.78 mg/dL (ref 0.61–1.24)
GFR calc Af Amer: >60 mL/min (ref >60)
GFR calc non Af Amer: >60 mL/min (ref >60)
Glucose, Bld: 106 mg/dL — ABNORMAL HIGH (ref 70–99)
Potassium: 4.4 mmol/L (ref 3.5–5.1)
Sodium: 139 mmol/L (ref 135–145)

## 2019-08-14 NOTE — Telephone Encounter (Signed)
Patient called with questions about surgery, I called back today at 1:40 PM, no answer. Did not leave voicemail message.

## 2019-08-15 ENCOUNTER — Telehealth: Payer: Self-pay

## 2019-08-15 ENCOUNTER — Telehealth: Payer: Self-pay | Admitting: Surgical

## 2019-08-15 NOTE — Telephone Encounter (Signed)
Patient called with some questions about his upcoming surgery. He mentioned that he was unable to pick up his prescription for oxycodone 5 mg. Of note he does chronically take oxycodone 5 mg for which is prescribed from the [place]. Patient and I had discussed at his preop appointment that during the 5 days postop he is to not take any of the oxycodone from his physician or provider at the [facility] and only take the 5 days that I had prescribed. Patient had noted that he is going to run out of his dosing from the [place] sooner than a month because he only receives a monthly dosing, I advised him that I will be unable to prescribe any more narcotics other than the 5-day supply already sent in. He also reports that he was unable to pick this up from the pharmacy and that he will call the pharmacy to further discuss. I advised him that after the 5 days of oxycodone has been used that he should transition to ibuprofen and Tylenol.   We then further discussed the surgical procedure which I noted was to release the scar and right breast capsule contracture to decrease pain and improve appearance of incision. Patient was under the impression that we were going to be placing something within the breast cavity to make the breast larger and match his left side. We discussed that that was not the case and that we would not be rearranging any tissue or placing any objects within the breast cavity to change the contour.  Patient understood and agreed and I recommend that he if he has any further questions to call us.

## 2019-08-15 NOTE — Telephone Encounter (Signed)
Patient had some questions about his upcoming surgery. Please call to discuss.

## 2019-08-15 NOTE — Telephone Encounter (Signed)
I discussed with pharmacy staff that patient and I had discussed that he is to not take [facility] during his 5 days postop and that I would not be prescribing any additional oxycodone past the 5 days.  Patient has agreed to this and has agreed that he would not be taking any of the oxycodone prescribed by the [place] during the time he is taking the oxycodone postop.

## 2019-08-15 NOTE — Telephone Encounter (Signed)
Received call from pharmacy requesting call back from [facility], PA-C regarding a pain medication prescribed by him as he already has the same prescription from the [place] Please call back Lissa Merlin or Roderic Palau at [phone].

## 2019-08-16 ENCOUNTER — Other Ambulatory Visit (HOSPITAL_COMMUNITY)
Admission: RE | Admit: 2019-08-16 | Discharge: 2019-08-16 | Disposition: A | Discharge status: Home / Self Care | Payer: Medicare Other | Source: Ambulatory Visit | Attending: Plastic Surgery | Admitting: Plastic Surgery

## 2019-08-16 DIAGNOSIS — Z20822 Contact with and (suspected) exposure to covid-19: Secondary | ICD-10-CM | POA: Insufficient documentation

## 2019-08-16 DIAGNOSIS — Z01812 Encounter for preprocedural laboratory examination: Secondary | ICD-10-CM | POA: Diagnosis present

## 2019-08-16 LAB — SARS CORONAVIRUS 2 (TAT 6-24 HRS): SARS Coronavirus 2: NEGATIVE

## 2019-08-18 ENCOUNTER — Telehealth: Payer: Self-pay

## 2019-08-18 NOTE — Telephone Encounter (Signed)
Patient called requesting call back to discuss prescription mix up.

## 2019-08-19 NOTE — Telephone Encounter (Signed)
Called patient again at 3:31 PM, sent to voicemail. Did not leave voicemail

## 2019-08-19 NOTE — Telephone Encounter (Signed)
Called patient to discuss, no answer, I did not leave a voicemail message. Will retry at a later time.

## 2019-08-20 ENCOUNTER — Other Ambulatory Visit: Payer: Self-pay | Admitting: Surgical

## 2019-08-20 ENCOUNTER — Ambulatory Visit (HOSPITAL_BASED_OUTPATIENT_CLINIC_OR_DEPARTMENT_OTHER)
Admission: RE | Admit: 2019-08-20 | Discharge: 2019-08-20 | Disposition: A | Discharge status: Home / Self Care | Payer: Medicare Other | Attending: Plastic Surgery | Admitting: Plastic Surgery

## 2019-08-20 ENCOUNTER — Encounter (HOSPITAL_BASED_OUTPATIENT_CLINIC_OR_DEPARTMENT_OTHER)
Admission: RE | Disposition: A | Discharge status: Home / Self Care | Payer: Self-pay | Source: Home / Self Care | Attending: Plastic Surgery

## 2019-08-20 ENCOUNTER — Ambulatory Visit (HOSPITAL_BASED_OUTPATIENT_CLINIC_OR_DEPARTMENT_OTHER): Payer: Medicare Other | Admitting: Anesthesiology

## 2019-08-20 ENCOUNTER — Other Ambulatory Visit: Payer: Self-pay

## 2019-08-20 ENCOUNTER — Telehealth: Payer: Self-pay | Admitting: Plastic Surgery

## 2019-08-20 ENCOUNTER — Encounter (HOSPITAL_BASED_OUTPATIENT_CLINIC_OR_DEPARTMENT_OTHER): Payer: Self-pay | Admitting: Plastic Surgery

## 2019-08-20 DIAGNOSIS — I4891 Unspecified atrial fibrillation: Secondary | ICD-10-CM | POA: Diagnosis not present

## 2019-08-20 DIAGNOSIS — M1990 Unspecified osteoarthritis, unspecified site: Secondary | ICD-10-CM | POA: Diagnosis not present

## 2019-08-20 DIAGNOSIS — K219 Gastro-esophageal reflux disease without esophagitis: Secondary | ICD-10-CM | POA: Insufficient documentation

## 2019-08-20 DIAGNOSIS — N644 Mastodynia: Secondary | ICD-10-CM | POA: Diagnosis not present

## 2019-08-20 DIAGNOSIS — Z79899 Other long term (current) drug therapy: Secondary | ICD-10-CM | POA: Insufficient documentation

## 2019-08-20 DIAGNOSIS — M109 Gout, unspecified: Secondary | ICD-10-CM | POA: Insufficient documentation

## 2019-08-20 DIAGNOSIS — D241 Benign neoplasm of right breast: Secondary | ICD-10-CM | POA: Insufficient documentation

## 2019-08-20 DIAGNOSIS — Z9011 Acquired absence of right breast and nipple: Secondary | ICD-10-CM | POA: Diagnosis not present

## 2019-08-20 DIAGNOSIS — I4892 Unspecified atrial flutter: Secondary | ICD-10-CM | POA: Diagnosis not present

## 2019-08-20 DIAGNOSIS — I5043 Acute on chronic combined systolic (congestive) and diastolic (congestive) heart failure: Secondary | ICD-10-CM | POA: Insufficient documentation

## 2019-08-20 DIAGNOSIS — L905 Scar conditions and fibrosis of skin: Secondary | ICD-10-CM | POA: Diagnosis present

## 2019-08-20 DIAGNOSIS — N6489 Other specified disorders of breast: Secondary | ICD-10-CM

## 2019-08-20 HISTORY — PX: SCAR REVISION: SHX5285

## 2019-08-20 HISTORY — PX: CAPSULOTOMY: SHX379

## 2019-08-20 SURGERY — CAPSULOTOMY
Anesthesia: General | Site: Breast | Laterality: Right

## 2019-08-20 MED ORDER — ONDANSETRON HCL 4 MG/2ML IJ SOLN
INTRAMUSCULAR | Status: DC | PRN
  Administered 2019-08-20: 4 mg via INTRAVENOUS

## 2019-08-20 MED ORDER — ACETAMINOPHEN 500 MG PO TABS: 1000.0000 mg | ORAL_TABLET | Freq: Once | ORAL | Status: DC

## 2019-08-20 MED ORDER — LACTATED RINGERS IV SOLN: INTRAVENOUS | Status: DC

## 2019-08-20 MED ORDER — EPHEDRINE SULFATE 50 MG/ML IJ SOLN
INTRAMUSCULAR | Status: DC | PRN
Start: 2019-08-20 — End: 2019-08-20
  Administered 2019-08-20: 10 mg via INTRAVENOUS

## 2019-08-20 MED ORDER — SODIUM CHLORIDE 0.9% FLUSH: 3.0000 mL | INTRAVENOUS | Status: DC | PRN

## 2019-08-20 MED ORDER — HYDROMORPHONE HCL 1 MG/ML IJ SOLN
0.2500 mg | INTRAMUSCULAR | Status: DC | PRN
  Administered 2019-08-20 (×2): 0.25 mg via INTRAVENOUS

## 2019-08-20 MED ORDER — FENTANYL CITRATE (PF) 100 MCG/2ML IJ SOLN
25.0000 ug | INTRAMUSCULAR | Status: DC | PRN
  Administered 2019-08-20: 25 ug via INTRAVENOUS

## 2019-08-20 MED ORDER — SODIUM CHLORIDE 0.9% FLUSH: 3.0000 mL | Freq: Two times a day (BID) | INTRAVENOUS | Status: DC

## 2019-08-20 MED ORDER — DEXAMETHASONE SODIUM PHOSPHATE 4 MG/ML IJ SOLN
INTRAMUSCULAR | Status: DC | PRN
  Administered 2019-08-20: 5 mg via INTRAVENOUS

## 2019-08-20 MED ORDER — BUPIVACAINE LIPOSOME 1.3 % IJ SUSP
INTRAMUSCULAR | Status: DC | PRN
  Administered 2019-08-20: 20 mL

## 2019-08-20 MED ORDER — HYDROMORPHONE HCL 1 MG/ML IJ SOLN
INTRAMUSCULAR | Status: DC | PRN
  Administered 2019-08-20: .5 mg via INTRAVENOUS

## 2019-08-20 MED ORDER — LIDOCAINE HCL (CARDIAC) PF 100 MG/5ML IV SOSY
PREFILLED_SYRINGE | INTRAVENOUS | Status: DC | PRN
  Administered 2019-08-20: 60 mg via INTRAVENOUS

## 2019-08-20 MED ORDER — OXYCODONE HCL 5 MG PO TABS: 5.0000 mg | ORAL_TABLET | ORAL | Status: DC | PRN

## 2019-08-20 MED ORDER — ACETAMINOPHEN 10 MG/ML IV SOLN
INTRAVENOUS | Status: AC
  Filled 2019-08-20: qty 100

## 2019-08-20 MED ORDER — PROPOFOL 10 MG/ML IV BOLUS
INTRAVENOUS | Status: DC | PRN
  Administered 2019-08-20: 200 mg via INTRAVENOUS

## 2019-08-20 MED ORDER — SODIUM CHLORIDE 0.9 % IV SOLN: 250.0000 mL | INTRAVENOUS | Status: DC | PRN

## 2019-08-20 MED ORDER — CEFAZOLIN SODIUM-DEXTROSE 2-4 GM/100ML-% IV SOLN
2.0000 g | INTRAVENOUS | Status: AC
  Administered 2019-08-20: 2 g via INTRAVENOUS

## 2019-08-20 MED ORDER — ACETAMINOPHEN 325 MG PO TABS: 650.0000 mg | ORAL_TABLET | ORAL | Status: DC | PRN

## 2019-08-20 MED ORDER — ACETAMINOPHEN 10 MG/ML IV SOLN
INTRAVENOUS | Status: DC | PRN
  Administered 2019-08-20: 1000 mg via INTRAVENOUS

## 2019-08-20 MED ORDER — DROPERIDOL 2.5 MG/ML IJ SOLN
INTRAMUSCULAR | Status: AC
  Filled 2019-08-20: qty 2

## 2019-08-20 MED ORDER — HYDROMORPHONE HCL 1 MG/ML IJ SOLN
INTRAMUSCULAR | Status: AC
  Filled 2019-08-20: qty 0.5

## 2019-08-20 MED ORDER — OXYCODONE HCL 5 MG PO TABS: 5.0000 mg | ORAL_TABLET | Freq: Four times a day (QID) | ORAL | 0 refills | Status: AC | PRN

## 2019-08-20 MED ORDER — CEFAZOLIN SODIUM-DEXTROSE 2-4 GM/100ML-% IV SOLN
INTRAVENOUS | Status: AC
  Filled 2019-08-20: qty 100

## 2019-08-20 MED ORDER — ACETAMINOPHEN 80 MG RE SUPP: 650.0000 mg | RECTAL | Status: DC | PRN

## 2019-08-20 MED ORDER — PHENYLEPHRINE HCL-NACL 10-0.9 MG/250ML-% IV SOLN
INTRAVENOUS | Status: DC | PRN
  Administered 2019-08-20: 50 ug/min via INTRAVENOUS

## 2019-08-20 MED ORDER — PHENYLEPHRINE HCL (PRESSORS) 10 MG/ML IV SOLN
INTRAVENOUS | Status: DC | PRN
  Administered 2019-08-20: 120 ug via INTRAVENOUS

## 2019-08-20 MED ORDER — FENTANYL CITRATE (PF) 100 MCG/2ML IJ SOLN
INTRAMUSCULAR | Status: AC
  Filled 2019-08-20: qty 2

## 2019-08-20 MED ORDER — FENTANYL CITRATE (PF) 100 MCG/2ML IJ SOLN
INTRAMUSCULAR | Status: DC | PRN
  Administered 2019-08-20: 100 ug via INTRAVENOUS

## 2019-08-20 MED ORDER — FENTANYL CITRATE (PF) 100 MCG/2ML IJ SOLN: 12.5000 ug | INTRAMUSCULAR | Status: DC | PRN

## 2019-08-20 MED ORDER — VASOPRESSIN 20 UNIT/ML IV SOLN
INTRAVENOUS | Status: AC
  Filled 2019-08-20: qty 1

## 2019-08-20 MED ORDER — CHLORHEXIDINE GLUCONATE CLOTH 2 % EX PADS: 6.0000 | MEDICATED_PAD | Freq: Once | CUTANEOUS | Status: DC

## 2019-08-20 MED ORDER — HYDROMORPHONE HCL 1 MG/ML IJ SOLN
INTRAMUSCULAR | Status: AC
  Filled 2019-08-20: qty 1

## 2019-08-20 SURGICAL SUPPLY — 60 items
BAG DECANTER FOR FLEXI CONT (MISCELLANEOUS) ×4 IMPLANT
BINDER BREAST LRG (GAUZE/BANDAGES/DRESSINGS) IMPLANT
BINDER BREAST MEDIUM (GAUZE/BANDAGES/DRESSINGS) IMPLANT
BINDER BREAST XLRG (GAUZE/BANDAGES/DRESSINGS) IMPLANT
BINDER BREAST XXLRG (GAUZE/BANDAGES/DRESSINGS) IMPLANT
BIOPATCH RED 1 DISK 7.0 (GAUZE/BANDAGES/DRESSINGS) IMPLANT
BIOPATCH RED 1IN DISK 7.0MM (GAUZE/BANDAGES/DRESSINGS)
BLADE HEX COATED 2.75 (ELECTRODE) ×4 IMPLANT
BLADE SURG 15 STRL LF DISP TIS (BLADE) ×2 IMPLANT
BLADE SURG 15 STRL SS (BLADE) ×4
BNDG GAUZE ELAST 4 BULKY (GAUZE/BANDAGES/DRESSINGS) ×8 IMPLANT
CANISTER SUCT 1200ML W/VALVE (MISCELLANEOUS) ×4 IMPLANT
COVER BACK TABLE 60X90IN (DRAPES) ×4 IMPLANT
COVER MAYO STAND STRL (DRAPES) ×4 IMPLANT
COVER WAND RF STERILE (DRAPES) IMPLANT
DECANTER SPIKE VIAL GLASS SM (MISCELLANEOUS) IMPLANT
DERMABOND ADVANCED (GAUZE/BANDAGES/DRESSINGS)
DERMABOND ADVANCED .7 DNX12 (GAUZE/BANDAGES/DRESSINGS) IMPLANT
DRAIN CHANNEL 19F RND (DRAIN) IMPLANT
DRAPE LAPAROSCOPIC ABDOMINAL (DRAPES) ×4 IMPLANT
DRSG MEPILEX BORDER 4X8 (GAUZE/BANDAGES/DRESSINGS) ×2 IMPLANT
DRSG PAD ABDOMINAL 8X10 ST (GAUZE/BANDAGES/DRESSINGS) ×8 IMPLANT
ELECT BLADE 4.0 EZ CLEAN MEGAD (MISCELLANEOUS)
ELECT BLADE 6.5 EXT (BLADE) IMPLANT
ELECT REM PT RETURN 9FT ADLT (ELECTROSURGICAL) ×4
ELECTRODE BLDE 4.0 EZ CLN MEGD (MISCELLANEOUS) ×2 IMPLANT
ELECTRODE REM PT RTRN 9FT ADLT (ELECTROSURGICAL) ×2 IMPLANT
EVACUATOR SILICONE 100CC (DRAIN) IMPLANT
GAUZE SPONGE 4X4 12PLY STRL LF (GAUZE/BANDAGES/DRESSINGS) IMPLANT
GLOVE BIO SURGEON STRL SZ 6.5 (GLOVE) ×5 IMPLANT
GLOVE BIO SURGEONS STRL SZ 6.5 (GLOVE) ×1
GLOVE BIOGEL M STRL SZ7.5 (GLOVE) ×2 IMPLANT
GOWN STRL REUS W/ TWL LRG LVL3 (GOWN DISPOSABLE) ×4 IMPLANT
GOWN STRL REUS W/TWL LRG LVL3 (GOWN DISPOSABLE) ×12
NDL HYPO 25X1 1.5 SAFETY (NEEDLE) IMPLANT
NEEDLE HYPO 25X1 1.5 SAFETY (NEEDLE) IMPLANT
PENCIL SMOKE EVACUATOR (MISCELLANEOUS) ×4 IMPLANT
PIN SAFETY STERILE (MISCELLANEOUS) IMPLANT
SET BASIN DAY SURGERY F.S. (CUSTOM PROCEDURE TRAY) ×4 IMPLANT
SLEEVE SCD COMPRESS KNEE MED (MISCELLANEOUS) ×4 IMPLANT
SPONGE LAP 18X18 RF (DISPOSABLE) ×8 IMPLANT
STRIP SUTURE WOUND CLOSURE 1/2 (MISCELLANEOUS) IMPLANT
SUT MNCRL AB 4-0 PS2 18 (SUTURE) IMPLANT
SUT MON AB 3-0 SH 27 (SUTURE) ×8
SUT MON AB 3-0 SH27 (SUTURE) ×2 IMPLANT
SUT MON AB 5-0 PS2 18 (SUTURE) ×4 IMPLANT
SUT PDS 3-0 CT2 (SUTURE)
SUT PDS AB 2-0 CT2 27 (SUTURE) IMPLANT
SUT PDS II 3-0 CT2 27 ABS (SUTURE) IMPLANT
SUT VIC AB 3-0 SH 27 (SUTURE)
SUT VIC AB 3-0 SH 27X BRD (SUTURE) IMPLANT
SUT VICRYL 4-0 PS2 18IN ABS (SUTURE) IMPLANT
SYR BULB IRRIG 60ML STRL (SYRINGE) ×4 IMPLANT
SYR CONTROL 10ML LL (SYRINGE) ×2 IMPLANT
TOWEL GREEN STERILE FF (TOWEL DISPOSABLE) ×8 IMPLANT
TRAY DSU PREP LF (CUSTOM PROCEDURE TRAY) ×4 IMPLANT
TUBE CONNECTING 20'X1/4 (TUBING) ×1
TUBE CONNECTING 20X1/4 (TUBING) ×3 IMPLANT
UNDERPAD 30X36 HEAVY ABSORB (UNDERPADS AND DIAPERS) ×8 IMPLANT
YANKAUER SUCT BULB TIP NO VENT (SUCTIONS) ×4 IMPLANT

## 2019-08-20 NOTE — Interval H&P Note (Signed)
History and Physical Interval Note:  08/20/2019 7:37 AM  [name]  has presented today for surgery, with the diagnosis of Breast pain, Acquired absence of right nipple.  The various methods of treatment have been discussed with the patient and family. After consideration of risks, benefits and other options for treatment, the patient has consented to  Procedure(s): [place] OF RIGHT BREAST TISSUE (Right) as a surgical intervention.  The patient's history has been reviewed, patient examined, no change in status, stable for surgery.  I have reviewed the patient's chart and labs.  Questions were answered to the patient's satisfaction.     [name]

## 2019-08-20 NOTE — Discharge Instructions (Signed)
INSTRUCTIONS FOR AFTER SURGERY   You will likely have some questions about what to expect following your operation.  The following information will help you and your family understand what to expect when you are discharged from the hospital.  Following these guidelines will help ensure a smooth recovery and reduce risks of complications.  Postoperative instructions include information on: diet, wound care, medications and physical activity.  AFTER SURGERY Expect to go home after the procedure.  In some cases, you may need to spend one night in the hospital for observation.  DIET This surgery does not require a specific diet.  However, I have to mention that the healthier you eat the better your body can start healing. It is important to increasing your protein intake.  This means limiting the foods with added sugar.  Focus on fruits and vegetables and some meat.  If you have any liposuction during your procedure be sure to drink water.  If your urine is bright yellow, then it is concentrated, and you need to drink more water.  As a general rule after surgery, you should have 8 ounces of water every hour while awake.  If you find you are persistently nauseated or unable to take in liquids let us know.  NO TOBACCO USE or EXPOSURE.  This will slow your healing process and increase the risk of a wound.  WOUND CARE If you don't have a drain: You can shower the day after surgery.  Use fragrance free soap.  Dial, [place], [place] and Cetaphil are usually mild on the skin.  If you have steri-strips / tape directly attached to your skin leave them in place. It is OK to get these wet.  No baths, pools or hot tubs for two weeks. We close your incision to leave the smallest and best-looking scar. No ointment or creams on your incisions until given the go ahead.  Especially not Neosporin (Too many skin reactions with this one).  A few weeks after surgery you can use Mederma and start massaging the scar.   ACTIVITY No  heavy lifting until cleared by the doctor.  It is OK to walk and climb stairs. In fact, moving your legs is very important to decrease your risk of a blood clot.  It will also help keep you from getting deconditioned.  Every 1 to 2 hours get up and walk for 5 minutes. This will help with a quicker recovery back to normal.  Let pain be your guide so you don't do too much.   This is your time for TLC.   WORK Everyone returns to work at different times. As a rough guide, most people take at least 1 - 2 weeks off prior to returning to work. If you need documentation for your job, bring the forms to your postoperative follow up visit.  DRIVING Arrange for someone to bring you home from the hospital.  You may be able to drive a few days after surgery but not while taking any narcotics or valium.  BOWEL MOVEMENTS Constipation can occur after anesthesia and while taking pain medication.  It is important to stay ahead for your comfort.  We recommend taking Milk of Magnesia (2 tablespoons; twice a day) while taking the pain pills.  SEROMA This is fluid your body tried to put in the surgical site.  This is normal but if it creates excessive pain and swelling let us know.  It usually decreases in a few weeks.  MEDICATIONS and PAIN CONTROL At  your preoperative visit for you history and physical you were given the following medications: 1. An antibiotic: Start this medication when you get home and take according to the instructions on the bottle. 2. Zofran 4 mg:  This is to treat nausea and vomiting.  You can take this every 6 hours as needed and only if needed. 3. The Oxycodone was called into [facility].  Over the counter Medication to take: 4. Ibuprofen (Motrin) 600 mg:  Take this every 6 hours.  If you have additional pain then take 500 mg of the tylenol.  Only take the Norco after you have tried these two. 5. Miralax or stool softener of choice: Take this according to the bottle if you take the  [place] Call your surgeon's office if any of the following occur: . Fever 101 degrees F or greater . Excessive bleeding or fluid from the incision site. . Pain that increases over time without aid from the medications . Redness, warmth, or pus draining from incision sites . Persistent nausea or inability to take in liquids . Severe misshapen area that underwent the operation.     Post Anesthesia Home Care Instructions  Activity: Get plenty of rest for the remainder of the day. A responsible individual must stay with you for 24 hours following the procedure.  For the next 24 hours, DO NOT: -Drive a car -Paediatric nurse -Drink alcoholic beverages -Take any medication unless instructed by your physician -Make any legal decisions or sign important papers.  Meals: Start with liquid foods such as gelatin or soup. Progress to regular foods as tolerated. Avoid greasy, spicy, heavy foods. If nausea and/or vomiting occur, drink only clear liquids until the nausea and/or vomiting subsides. Call your physician if vomiting continues.  Special Instructions/Symptoms: Your throat may feel dry or sore from the anesthesia or the breathing tube placed in your throat during surgery. If this causes discomfort, gargle with warm salt water. The discomfort should disappear within 24 hours.  If you had a scopolamine patch placed behind your ear for the management of post- operative nausea and/or vomiting:  1. The medication in the patch is effective for 72 hours, after which it should be removed.  Wrap patch in a tissue and discard in the trash. Wash hands thoroughly with soap and water. 2. You may remove the patch earlier than 72 hours if you experience unpleasant side effects which may include dry mouth, dizziness or visual disturbances. 3. Avoid touching the patch. Wash your hands with soap and water after contact with the patch.      Information for Discharge Teaching: EXPAREL  (bupivacaine liposome injectable suspension)   Your surgeon or anesthesiologist gave you EXPAREL(bupivacaine) to help control your pain after surgery.   EXPAREL is a local anesthetic that provides pain relief by numbing the tissue around the surgical site.  EXPAREL is designed to release pain medication over time and can control pain for up to 72 hours.  Depending on how you respond to EXPAREL, you may require less pain medication during your recovery.  Possible side effects:  Temporary loss of sensation or ability to move in the area where bupivacaine was injected.  Nausea, vomiting, constipation  Rarely, numbness and tingling in your mouth or lips, lightheadedness, or anxiety may occur.  Call your doctor right away if you think you may be experiencing any of these sensations, or if you have other questions regarding possible side effects.  Follow all other discharge instructions given  to you by your surgeon or nurse. Eat a healthy diet and drink plenty of water or other fluids.  If you return to the hospital for any reason within 96 hours following the administration of EXPAREL, it is important for health care providers to know that you have received this anesthetic. A teal colored band has been placed on your arm with the date, time and amount of EXPAREL you have received in order to alert and inform your health care providers. Please leave this armband in place for the full 96 hours following administration, and then you may remove the band.

## 2019-08-20 NOTE — Anesthesia Procedure Notes (Signed)
Procedure Name: Intubation Date/Time: 08/20/2019 9:00 AM Performed by: Willa Frater, CRNA Pre-anesthesia Checklist: Patient identified, Emergency Drugs available, Suction available and Patient being monitored Patient Re-evaluated:Patient Re-evaluated prior to induction Oxygen Delivery Method: Circle system utilized Preoxygenation: Pre-oxygenation with 100% oxygen Induction Type: IV induction Ventilation: Mask ventilation without difficulty LMA Size: 4.0 Tube type: Oral Number of attempts: 1 Airway Equipment and Method: Stylet and Oral airway Placement Confirmation: ETT inserted through vocal cords under direct vision,  positive ETCO2 and breath sounds checked- equal and bilateral Tube secured with: Tape Dental Injury: Teeth and Oropharynx as per pre-operative assessment

## 2019-08-20 NOTE — Transfer of Care (Signed)
Immediate Anesthesia Transfer of Care Note  Patient: [name]  Procedure(s) Performed: SCAR CONTRACTURE AND CAPSULE RELEASE OF RIGHT BREAST TISSUE (Right Breast) SCAR REVISION (Right Breast)  Patient Location: PACU  Anesthesia Type:General  Level of Consciousness: awake, alert  and oriented  Airway & Oxygen Therapy: Patient Spontanous Breathing and Patient connected to face mask oxygen  Post-op Assessment: Report given to RN and Post -op Vital signs reviewed and stable  Post vital signs: Reviewed and stable  Last Vitals:  Vitals Value Taken Time  BP 100/68 08/20/19 0936  Temp    Pulse 82 08/20/19 0937  Resp 16 08/20/19 0937  SpO2 100 % 08/20/19 0937  Vitals shown include unvalidated device data.  Last Pain:  Vitals:   08/20/19 0758  TempSrc: Oral  PainSc: 0-No pain         Complications: No apparent anesthesia complications

## 2019-08-20 NOTE — Anesthesia Postprocedure Evaluation (Signed)
Anesthesia Post Note  Patient: [name]  Procedure(s) Performed: SCAR CONTRACTURE AND CAPSULE RELEASE OF RIGHT BREAST TISSUE (Right Breast) SCAR REVISION (Right Breast)     Patient location during evaluation: PACU Anesthesia Type: General Level of consciousness: awake and alert Pain management: pain level controlled Vital Signs Assessment: post-procedure vital signs reviewed and stable Respiratory status: spontaneous breathing, nonlabored ventilation, respiratory function stable and patient connected to nasal cannula oxygen Cardiovascular status: blood pressure returned to baseline and stable Postop Assessment: no apparent nausea or vomiting Anesthetic complications: no    Last Vitals:  Vitals:   08/20/19 1100 08/20/19 1129  BP: 93/74 94/67  Pulse: 94 78  Resp: 13 16  Temp:  (!) 36.4 C  SpO2: 95% 100%    Last Pain:  Vitals:   08/20/19 1129  TempSrc:   PainSc: 3                  Elsy Chiang L Mimi Debellis

## 2019-08-20 NOTE — Anesthesia Preprocedure Evaluation (Addendum)
Anesthesia Evaluation  Patient identified by MRN, date of birth, ID band Patient awake    Reviewed: Allergy & Precautions, NPO status , Patient's Chart, lab work & pertinent test results, reviewed documented beta blocker date and time   Airway Mallampati: I  TM Distance: >3 FB Neck ROM: Full    Dental no notable dental hx. (+) Teeth Intact, Dental Advisory Given   Pulmonary neg pulmonary ROS,    Pulmonary exam normal breath sounds clear to auscultation       Cardiovascular Normal cardiovascular exam+ dysrhythmias Atrial Fibrillation  Rhythm:Regular Rate:Normal  TTE 2020 1. The left ventricle has normal systolic function, with an ejection fraction of 55-60%. The cavity size was normal. There is mildly increased  left ventricular wall thickness. Left ventricular diastolic Doppler  parameters are consistent with impaired  relaxation.  2. The right ventricle has normal systolic function. The cavity was  normal. There is no increase in right ventricular wall thickness. Right  ventricular systolic pressure is mildly elevated with an estimated  pressure of 36.0 mmHg.  3. Left atrial size was mildly dilated.  4. There is mild mitral annular calcification present.  5. The aortic valve is tricuspid. Mild thickening of the aortic valve.  Mild calcification of the aortic valve. Aortic valve regurgitation is  trivial by color flow Doppler.  6. The inferior vena cava was dilated in size with <50% respiratory  variability.   Neuro/Psych PSYCHIATRIC DISORDERS Depression negative neurological ROS     GI/Hepatic GERD  ,(+)     substance abuse  alcohol use,   Endo/Other  negative endocrine ROS  Renal/GU negative Renal ROS  negative genitourinary   Musculoskeletal  (+) Arthritis ,   Abdominal   Peds  Hematology negative hematology ROS (+)   Anesthesia Other Findings MS contin 15mg   Reproductive/Obstetrics                             Anesthesia Physical Anesthesia Plan  ASA: III  Anesthesia Plan: General   Post-op Pain Management:    Induction: Intravenous  PONV Risk Score and Plan: 2 and Ondansetron and Dexamethasone  Airway Management Planned: LMA  Additional Equipment:   Intra-op Plan:   Post-operative Plan: Extubation in OR  Informed Consent: I have reviewed the patients History and Physical, chart, labs and discussed the procedure including the risks, benefits and alternatives for the proposed anesthesia with the patient or authorized representative who has indicated his/her understanding and acceptance.     Dental advisory given  Plan Discussed with: CRNA  Anesthesia Plan Comments:        Anesthesia Quick Evaluation

## 2019-08-20 NOTE — Op Note (Signed)
DATE OF OPERATION: 08/20/2019  LOCATION: [facility]  PREOPERATIVE DIAGNOSIS: Right breast contracture  POSTOPERATIVE DIAGNOSIS: Same  PROCEDURE: Release / excision of right breast contracture 1.5 x 7 cm  SURGEON: Kanija Remmel Sanger Jonell Brumbaugh, DO  ASSISTANT: Roetta Sessions, PA  EBL: 5 cc  CONDITION: Stable  COMPLICATIONS: None  INDICATION: The patient, [name], is a 74 y.o. male born on Feb 24, 1946, is here for treatment of right breast contracture.   PROCEDURE DETAILS:  The patient was seen prior to surgery and marked.  The IV antibiotics were given. The patient was taken to the operating room and given a general anesthetic. A standard time out was performed and all information was confirmed by those in the room. SCDs were placed.   Exparel was injected as a breast block. The area for excision was marked.  The #15 blade was used to make the excision of the 1.5 x 7 cm area of scar contracture.  The inferior portion of the incision / breast was released with the bovie.  Hemostasis was achieved with electrocautery.  The deep layer was closed with the 4-0 Monocryl.  The skin was closed with the 5-0 Monocryl. Derma bond was applied.  The patient was allowed to wake up and taken to recovery room in stable condition at the end of the case. The family was notified at the end of the case.   The advanced practice practitioner (APP) assisted throughout the case.  The APP was essential in retraction and counter traction when needed to make the case progress smoothly.  This retraction and assistance made it possible to see the tissue plans for the procedure.  The assistance was needed for blood control, tissue re-approximation and assisted with closure of the incision site.

## 2019-08-20 NOTE — Telephone Encounter (Signed)
Dr. Marla Roe discussed this with pharmacy staff this AM after receiving call. Will call family to advise.

## 2019-08-20 NOTE — Progress Notes (Signed)
Resent rx for oxycodone for post-op pain control. Patient, patient's son, Dr. Marla Roe and I have thoroughly discussed to not use oxycodone prescribed by the [facility] during the post-operative course and to only use rx prescribed by [place] for post-op pain. Patient has agreed, son is aware.

## 2019-08-20 NOTE — Telephone Encounter (Signed)
Patient's son called to advise that [name] needs Dr. Marla Roe or Catalina Antigua to call and confirm prescription for Mr. Mcclammy before they will fill it. Son is trying to pick prescription up on the way home. Please call to advise. [phone]

## 2019-08-21 ENCOUNTER — Encounter: Payer: Self-pay | Admitting: *Deleted

## 2019-08-21 LAB — SURGICAL PATHOLOGY

## 2019-08-29 ENCOUNTER — Other Ambulatory Visit: Payer: Self-pay

## 2019-08-29 ENCOUNTER — Encounter: Payer: Self-pay | Admitting: Surgical

## 2019-08-29 ENCOUNTER — Ambulatory Visit (INDEPENDENT_AMBULATORY_CARE_PROVIDER_SITE_OTHER): Payer: Medicare Other | Admitting: Surgical

## 2019-08-29 VITALS — BP 118/72 | HR 62 | Temp 97.5°F | Ht 73.0 in | Wt 210.0 lb

## 2019-08-29 DIAGNOSIS — Z9011 Acquired absence of right breast and nipple: Secondary | ICD-10-CM | POA: Diagnosis not present

## 2019-08-29 DIAGNOSIS — N644 Mastodynia: Secondary | ICD-10-CM | POA: Diagnosis not present

## 2019-08-29 NOTE — Progress Notes (Signed)
Patient is a 74 year old male here for follow-up after release last excision of right breast contracture on 08/20/2019 with Dr. Marla Roe.  Patient previously underwent right breast surgery for excision of a questionable lesion, it is unclear whether the lesion excised was a cyst or some form of skin cancer -patient is unsure.  Patient had his right NAC excised at that time.  [name] is doing really well today.  He reports the pain he had prior to surgery has completely resolved.  He is very happy about this.  He is not having any fevers, chills, nausea, vomiting, chest pain, shortness of breath.  He does currently take Lasix for bilateral lower extremity swelling, his bilateral lower extremities today are quite swollen.  They are not tender to palpation.  He reports that this happens daily and also notes that he did not take his Lasix today because he knew he was going to be out and did not want to have to use the bathroom frequently.  He reports that he typically elevates his legs while at home and this significantly helps.    On exam right breast incision is healing really nicely, Dermabond in place.  He does have a little bit of bruising of the right breast, this is beginning to yellow in most places.  There is no fluid collection, fluid wave, erythema, dehiscence.   Recommend calling cardiology to discuss possibility of increasing Lasix dosing due to swelling.  Patient reports he will call cardiology this afternoon or tomorrow morning.  Discussed signs/symptoms of DVT/PE.  Patient in aware and agrees.  He does not report any history or family history of DVT or PE.  He has had multiple ultrasounds of his bilateral lower extremities in the past.  Patient has a follow-up appointment on 09/12/2019 with Dr. Marla Roe.  Call with any questions or concerns prior to that appointment.  Please call if he develops any symptoms suggestive of a DVT or PE.

## 2019-09-03 ENCOUNTER — Telehealth: Payer: Self-pay | Admitting: Cardiovascular Disease

## 2019-09-03 ENCOUNTER — Telehealth: Payer: Self-pay | Admitting: Physician Assistant

## 2019-09-03 ENCOUNTER — Telehealth: Payer: Self-pay | Admitting: Plastic Surgery

## 2019-09-03 NOTE — Telephone Encounter (Signed)
Patient called saying that he was feeling weak and sore all over and there is more weakness where he had surgery. It said its like cramping sensation. Please call to advise

## 2019-09-03 NOTE — Telephone Encounter (Signed)
New message   Patient is having cramps in legs, stomach and hands. Please advise.

## 2019-09-03 NOTE — Telephone Encounter (Signed)
He is also complaining being sore all over the body.

## 2019-09-03 NOTE — Telephone Encounter (Signed)
Returned phone call to patient.   Two days ago started feeling weak and "cramped up all over."  He questions his medication regimen. He is compliant on 20 mg lasix, 120 mg cardizem PRN only (last took yesterday for HR 126), lopressor 25 mg BID. Today HR is 50-60s. He continues to take daily Mg and K. He states his muscles are sore and he is getting cramps in his thighs and calves. He is currently in physical therapy for left shoulder and right chest. He denies chest pain, dyspnea, or orthopnea. He reports lower extremity swelling that started 2 days ago. Swelling is gone in the AM and progressively worsens throughout the day. He has a history of chronic diastolic heart failure. I have asked him to increased his lasix to 40 mg x 3 days.   [facility]   Please add him on to an APP schedule at earliest convenience.   I reviewed ER precautions and he expressed understanding of the plan.

## 2019-09-03 NOTE — Telephone Encounter (Signed)
Called the patient at 3:30 PM as I was in surgery prior.  As the patient to describe to me what was going on, he noted that for the past few days he has noticed that his legs have continued to swell.  He also notes some cramping in his stomach and lower legs.  He reported that he went to his PT appointment for PT for his shoulder as previously noted by Sallyanne Kuster, [place].  He noted his vital signs were normal.  He reported that the PT stated to him, "you do not look well".  Patient stated that other than the above-mentioned symptoms he is not having any shortness of breath, chest pain, inspiratory pain, weakness.  He reports 1 leg is not more swollen than the other.  He reports that in the morning his legs are not swollen but throughout the day they get worse.  He reports that he did call his cardiologist after being advised by Olivia Mackie, CMA.  He reports that he is waiting on a return call.  As we were talking on the phone he received phone call from the cardiologist, but unfortunately he missed it.  I recommended he call the cardiologist back and call me back with an update.

## 2019-09-03 NOTE — Telephone Encounter (Signed)
Called patient at 4:53 pm to check-in. He reports he has been on hold waiting to discuss with cardiology team. I informed him if he is unable to discuss with cardiology team tonight, he needs to have an appointment scheduled with his PCP or cardiologist for further evaluation. If symptoms do not improve or they worsen, I recommend he be evaluated in the emergency room. He agreed and understood. I informed him to call us with updates.

## 2019-09-03 NOTE — Telephone Encounter (Signed)
Left message to call back  

## 2019-09-03 NOTE — Telephone Encounter (Addendum)
Returned patients call. He is cramping in his stomach and lower legs along with swelling. He had gone to his PT and his vitals were as indicated: BP: 120/55, temp 98.7, Ox 98, pulse 65.  He has been keeping up with fluid intake and has been eating as well. I reviewed his discussion with his last appointment with [facility] and he is to call and make an appointment with his cardiologist to adjust his lasix and signs/symptoms of DVT/PE. I advised him to call his cardiologist as soon as he hangs up the phone with me. Will advise Matt what is going on with patient. P

## 2019-09-03 NOTE — Telephone Encounter (Signed)
Patient spoke with on call Angie D PA, see phone note 6/2

## 2019-09-04 ENCOUNTER — Ambulatory Visit: Payer: Medicare Other | Admitting: General Practice

## 2019-09-04 NOTE — Telephone Encounter (Signed)
D/W JESSE CLEAVER, FNP-C, HE STATES THAT THIS APPT IS TOO SOON. WE SHOULD RESCHEDULE UNTIL AFTER INCREASED LASIX DOSING IS FINISHED. NEW APPT MADE W/JB TUES 6-8 @315PM . PT AGREED TO NEW JB APPT AND VERBALIZED UNDERSTANDING

## 2019-09-04 NOTE — Telephone Encounter (Signed)
Spoke with patient. Appointment made with Coletta Memos, NP for 09/04/19.

## 2019-09-09 ENCOUNTER — Encounter: Payer: Self-pay | Admitting: Cardiovascular Disease

## 2019-09-09 ENCOUNTER — Ambulatory Visit (INDEPENDENT_AMBULATORY_CARE_PROVIDER_SITE_OTHER): Payer: Medicare Other | Admitting: Cardiovascular Disease

## 2019-09-09 ENCOUNTER — Other Ambulatory Visit: Payer: Self-pay

## 2019-09-09 ENCOUNTER — Other Ambulatory Visit: Payer: Self-pay | Admitting: *Deleted

## 2019-09-09 ENCOUNTER — Telehealth: Payer: Self-pay | Admitting: Cardiovascular Disease

## 2019-09-09 DIAGNOSIS — R600 Localized edema: Secondary | ICD-10-CM | POA: Diagnosis not present

## 2019-09-09 MED ORDER — FUROSEMIDE 20 MG PO TABS: 20.0000 mg | ORAL_TABLET | Freq: Every day | ORAL | 1 refills | Status: DC

## 2019-09-09 MED ORDER — FUROSEMIDE 40 MG PO TABS: 40.0000 mg | ORAL_TABLET | Freq: Every day | ORAL | 6 refills | Status: DC

## 2019-09-09 NOTE — Telephone Encounter (Signed)
Pt aware refills sent as requested ./cy ?

## 2019-09-09 NOTE — Telephone Encounter (Signed)
New Message   Pt c/o medication issue:  1. Name of Medication: furosemide (LASIX) 20 MG tablet  2. How are you currently taking this medication (dosage and times per day)? 20 mg 1 x daily   3. Are you having a reaction (difficulty breathing--STAT)? No   4. What is your medication issue? Pt says the prescription is suppose to be for 40 mg and to take that for 5 days.  He says the pharmacy only has 20mg .     Please call

## 2019-09-09 NOTE — Progress Notes (Signed)
09/09/2019 [name]   [birth year]  [id number]  Primary Physician Administration, [facility] Primary Cardiologist: Lorretta Harp MD FACP, [place], [place], [place]  HPI:  [name] is a 74 y.o.  mildly overweight married Caucasian male father of 2, grandfather of 1 grandchild who I initially saw in consultation 02/04/2019 when he was admitted with diastolic heart failure and A. fib with RVR.  I last saw him in the office 08/05/2019. He is retired from the [employer]. He does not smoke. He drinks daily but only 2 beers a night and occasional wine with dinner. He has no history of diabetes, hypertension or hyperlipidemia. Never had a heart attack or stroke. There is no family's for heart disease. He is fairly active. He did play division 3 football and hockey in his younger years. He is unaware that he is in A. fib at this point.   Since I saw him in the office a month ago he did notice significant increase in lower extremity edema after [place] Day when apparently he ate a lot of salt and drank more alcohol than usual.  He was told to double his furosemide from 20 to 40 mg a day which is resulted in mild decrease in his edema.  He does have 2-3+ edema on exam today.  He denies shortness of breath.  2D echo performed 10/12/2018 revealed normal EF with diastolic dysfunction.    Current Meds  Medication Sig  . allopurinol (ZYLOPRIM) 100 MG tablet Take 300 mg by mouth daily.  . ARTIFICIAL TEARS 0.2-0.2-1 % SOLN   . CALCIUM 600 1500 (600 Ca) MG TABS tablet Take 600 mg of elemental calcium by mouth 2 (two) times daily.   . cyanocobalamin 2000 MCG tablet Take 2,000 mcg by mouth daily.  Marland Kitchen diltiazem (CARDIZEM CD) 120 MG 24 hr capsule Take 1 capsule (120 mg total) by mouth daily as needed (IF YOUR HEART RATE GOES OVER 100).  . famotidine (PEPCID) 20 MG tablet Take 20 mg by mouth 2 (two) times a day.  . furosemide (LASIX) 20 MG tablet Take 20 mg by mouth daily.  Marland Kitchen ibuprofen (ADVIL) 600 MG  tablet   . indomethacin (INDOCIN) 50 MG capsule   . ipratropium (ATROVENT) 0.03 % nasal spray Place 1 spray into the nose daily as needed for rhinitis.   . magnesium oxide (MAG-OX) 400 MG tablet Take 1 tablet by mouth 2 (two) times daily.  . metoprolol tartrate (LOPRESSOR) 25 MG tablet Take 1 tablet (25 mg total) by mouth 2 (two) times daily. Hold SYSTOLIC BP <627, HR <03  . morphine (MS CONTIN) 15 MG 12 hr tablet Take 15 mg by mouth every 12 (twelve) hours.  . Multiple Vitamin (MULTIVITAMIN) tablet Take 1 tablet by mouth daily.  Marland Kitchen oxyCODONE (OXY IR/ROXICODONE) 5 MG immediate release tablet Take 1 tablet (5 mg total) by mouth every 4 (four) hours as needed for severe pain. In addition to current chronic pain medication (Patient taking differently: Take 5 mg by mouth every 4 (four) hours. )  . potassium chloride SA (KLOR-CON) 20 MEQ tablet Take 20 mEq by mouth daily.  . sildenafil (VIAGRA) 100 MG tablet Take 100 mg by mouth daily as needed for erectile dysfunction.  . tamsulosin (FLOMAX) 0.4 MG CAPS capsule Take 0.4 mg by mouth.  . vitamin B-12 (CYANOCOBALAMIN) 1000 MCG tablet   . [DISCONTINUED] capsaicin (ZOSTRIX) 0.025 % cream Apply 1 application topically 2 (two) times daily as needed (pain.).  . [DISCONTINUED]  hydroxypropyl methylcellulose / hypromellose (ISOPTO TEARS / GONIOVISC) 2.5 % ophthalmic solution Place 1-2 drops into both eyes 3 (three) times daily as needed (dry/irritated eyes.).  . [DISCONTINUED] TIADYLT ER 240 MG 24 hr capsule      No Known Allergies  Social History   Socioeconomic History  . Marital status: Married    Spouse name: Not on file  . Number of children: Not on file  . Years of education: Not on file  . Highest education level: Not on file  Occupational History  . Not on file  Tobacco Use  . Smoking status: Never Smoker  . Smokeless tobacco: Never Used  Substance and Sexual Activity  . Alcohol use: Yes    Alcohol/week: 14.0 standard drinks    Types: 10  Standard drinks or equivalent, 4 Cans of beer per week    Comment: daily  . Drug use: No  . Sexual activity: Not on file  Other Topics Concern  . Not on file  Social History Narrative  . Not on file   Social Determinants of Health   Financial Resource Strain:   . Difficulty of Paying Living Expenses:   Food Insecurity:   . Worried About Charity fundraiser in the Last Year:   . Arboriculturist in the Last Year:   Transportation Needs:   . Film/video editor (Medical):   Marland Kitchen Lack of Transportation (Non-Medical):   Physical Activity:   . Days of Exercise per Week:   . Minutes of Exercise per Session:   Stress:   . Feeling of Stress :   Social Connections:   . Frequency of Communication with Friends and Family:   . Frequency of Social Gatherings with Friends and Family:   . Attends Religious Services:   . Active Member of Clubs or Organizations:   . Attends Archivist Meetings:   Marland Kitchen Marital Status:   Intimate Partner Violence:   . Fear of Current or Ex-Partner:   . Emotionally Abused:   Marland Kitchen Physically Abused:   . Sexually Abused:      Review of Systems: General: negative for chills, fever, night sweats or weight changes.  Cardiovascular: negative for chest pain, dyspnea on exertion, edema, orthopnea, palpitations, paroxysmal nocturnal dyspnea or shortness of breath Dermatological: negative for rash Respiratory: negative for cough or wheezing Urologic: negative for hematuria Abdominal: negative for nausea, vomiting, diarrhea, bright red blood per rectum, melena, or hematemesis Neurologic: negative for visual changes, syncope, or dizziness All other systems reviewed and are otherwise negative except as noted above.    Blood pressure 100/70, height 6\' 1"  (1.854 m), weight 216 lb (98 kg).  General appearance: alert and no distress Neck: no adenopathy, no carotid bruit, no JVD, supple, symmetrical, trachea midline and thyroid not enlarged, symmetric, no  tenderness/mass/nodules Lungs: clear to auscultation bilaterally Heart: irregularly irregular rhythm Extremities: 2-3+ pitting edema bilaterally Pulses: 2+ and symmetric Skin: Skin color, texture, turgor normal. No rashes or lesions Neurologic: Alert and oriented X 3, normal strength and tone. Normal symmetric reflexes. Normal coordination and gait  EKG not performed today  ASSESSMENT AND PLAN:   Lower extremity edema Mr. Esmeralda Links returns today because of lower extremity edema.  I last saw him approxi-1 month ago.  He states that since [facility] Day when he probably ate a lot of salt and drink a lot of alcohol he has had significant weight gain of lower extremity edema.  He doubled his furosemide to 40 mg once a  day which resulted in mild decrease in his edema.  He denies shortness of breath.  I am going to increase his furosemide from 40 mg once a day to twice daily for 5 days.  We will check a basic metabolic panel.  After 5 days we will go back to 40 mg a day.  I am going to check a 2D echocardiogram for LV function.  His EF was normal back on 06/27/7122 with diastolic dysfunction.  We will see an APP back in 2 to 3 weeks me back in 3 to 4 months.      Lorretta Harp MD FACP,FACC,FAHA, [facility] 09/09/2019 3:47 PM

## 2019-09-09 NOTE — Patient Instructions (Signed)
Medication Instructions:  Increase Lasix to 40 mg twice daily for 5 days.   *If you need a refill on your cardiac medications before your next appointment, please call your pharmacy*   Lab Work: BMET today  If you have labs (blood work) drawn today and your tests are completely normal, you will receive your results only by: Marland Kitchen MyChart Message (if you have MyChart) OR . A paper copy in the mail If you have any lab test that is abnormal or we need to change your treatment, we will call you to review the results.   Testing/Procedures: Echocardiogram - Your physician has requested that you have an echocardiogram. Echocardiography is a painless test that uses sound waves to create images of your heart. It provides your doctor with information about the size and shape of your heart and how well your heart's chambers and valves are working. This procedure takes approximately one hour. There are no restrictions for this procedure. This will be performed at our [facility] location - [address].    Follow-Up: At [facility], you and your health needs are our priority.  As part of our continuing mission to provide you with exceptional heart care, we have created designated Provider Care Teams.  These Care Teams include your primary Cardiologist (physician) and Advanced Practice Providers (APPs -  Physician Assistants and Nurse Practitioners) who all work together to provide you with the care you need, when you need it.  We recommend signing up for the patient portal called "MyChart".  Sign up information is provided on this After Visit Summary.  MyChart is used to connect with patients for Virtual Visits (Telemedicine).  Patients are able to view lab/test results, encounter notes, upcoming appointments, etc.  Non-urgent messages can be sent to your provider as well.   To learn more about what you can do with MyChart, go to [email or web address].    Your next appointment:   See  APP (PA, NP) in 2 weeks. See Dr.Berry in 3-4 months.

## 2019-09-09 NOTE — Assessment & Plan Note (Signed)
Mr. [name] returns today because of lower extremity edema.  I last saw him approxi-1 month ago.  He states that since [facility] Day when he probably ate a lot of salt and drink a lot of alcohol he has had significant weight gain of lower extremity edema.  He doubled his furosemide to 40 mg once a day which resulted in mild decrease in his edema.  He denies shortness of breath.  I am going to increase his furosemide from 40 mg once a day to twice daily for 5 days.  We will check a basic metabolic panel.  After 5 days we will go back to 40 mg a day.  I am going to check a 2D echocardiogram for LV function.  His EF was normal back on 0/80/2233 with diastolic dysfunction.  We will see an APP back in 2 to 3 weeks me back in 3 to 4 months.

## 2019-09-10 LAB — BASIC METABOLIC PANEL
BUN/Creatinine Ratio: 21 (ref 10–24)
BUN: 21 mg/dL (ref 8–27)
CO2: 28 mmol/L (ref 20–29)
Calcium: 8.7 mg/dL (ref 8.6–10.2)
Chloride: 95 mmol/L — ABNORMAL LOW (ref 96–106)
Creatinine, Ser: 1.02 mg/dL (ref 0.76–1.27)
GFR calc Af Amer: 83 mL/min/{1.73_m2} (ref >59)
GFR calc non Af Amer: 72 mL/min/{1.73_m2} (ref >59)
Glucose: 98 mg/dL (ref 65–99)
Potassium: 4.9 mmol/L (ref 3.5–5.2)
Sodium: 135 mmol/L (ref 134–144)

## 2019-09-10 NOTE — Telephone Encounter (Signed)
I spoke to the patient and informed him that Altha Harm submitted his Lasix prescription to the pharmacy at 4:51 pm on 6/8.  He verbalized understanding.

## 2019-09-12 ENCOUNTER — Other Ambulatory Visit: Payer: Self-pay

## 2019-09-12 ENCOUNTER — Encounter: Payer: Self-pay | Admitting: Plastic Surgery

## 2019-09-12 ENCOUNTER — Ambulatory Visit (INDEPENDENT_AMBULATORY_CARE_PROVIDER_SITE_OTHER): Payer: Medicare Other | Admitting: Plastic Surgery

## 2019-09-12 VITALS — BP 136/81 | HR 73 | Temp 97.8°F

## 2019-09-12 DIAGNOSIS — Z9011 Acquired absence of right breast and nipple: Secondary | ICD-10-CM

## 2019-09-12 DIAGNOSIS — H57813 Brow ptosis, bilateral: Secondary | ICD-10-CM | POA: Insufficient documentation

## 2019-09-12 NOTE — Progress Notes (Signed)
   Subjective:    Patient ID: [name], male    DOB: January 16, 1946, 74 y.o.   MRN: [id number]  The patient is a 74 year old male here for follow-up on his right breast skin excision.  He is very pleased with the results.  He has had a significant improvement in his discomfort and pain.  The bruising has resolved.  There may be a little bit of swelling but for the most part he is doing extremely well.  I removed the sutures.  The patient also requested evaluation for his brows.  He has had a blepharoplasty in the past.  He has noticed significant hooding and obstruction of his visual field.     Review of Systems  Constitutional: Negative.   HENT: Negative.   Eyes: Negative.   Respiratory: Negative.   Cardiovascular: Negative.   Genitourinary: Negative.   Musculoskeletal: Negative.        Objective:   Physical Exam Vitals and nursing note reviewed.  Constitutional:      Appearance: Normal appearance.  HENT:     Head: Normocephalic and atraumatic.  Cardiovascular:     Rate and Rhythm: Normal rate.     Pulses: Normal pulses.  Pulmonary:     Effort: Pulmonary effort is normal. No respiratory distress.  Chest:    Neurological:     General: No focal deficit present.     Mental Status: He is alert. Mental status is at baseline.  Psychiatric:        Mood and Affect: Mood normal.        Behavior: Behavior normal.        Assessment & Plan:     ICD-10-CM   1. Acquired absence of nipple, right  Z90.11   2. Brow ptosis, bilateral  H57.813     Recommend massage to the area.  I would recommend waiting 3 months before nipple areola tattoo.  There is no shift of the breast I think he would benefit from an bilateral bilateral brow lift. Pictures were obtained of the patient and placed in the chart with the patient's or guardian's permission.

## 2019-09-19 ENCOUNTER — Telehealth: Payer: Self-pay | Admitting: Plastic Surgery

## 2019-09-19 NOTE — Telephone Encounter (Signed)
Called patient to ask about his most recent Visual Field Exam. He cannot recall when he had this test done, as it has been quite some time. I advised that we will need one to submit to Medicare for approval. [name] indicated that he will be traveling out of [place] for the next several months and will schedule a VFE when he returns. He will call our office and have the records sent over for review/submission.

## 2019-09-28 NOTE — Progress Notes (Signed)
Cardiology Office Note   Date:  09/29/2019   ID:  [name], DOB [birth year], MRN [id number]  PCP:  [facility]  Cardiologist: Dr. Gwenlyn Found  CC" Follow up   History of Present Illness: [name] is a 74 y.o. male who presents for ongoing assessment and management of chronic diastolic heart failure, history of atrial fib with RVR, and intermittent lower extremity edema depending upon salt intake.  On last office visit dated 09/09/2019 he was complaining of lower extremity edema, admitting to eating some salty foods and drinking a lot of alcohol during [place] weekend.    Lasix was increased to 40 mg once a day prior to coming in to the office however on last visit with Dr. Gwenlyn Found furosemide was increased to 40 mg twice a day for 5 days.  After which the patient was to go back to 40 mg daily.  Repeat echocardiogram was to be completed for changes in LV function.  He is here for follow-up to evaluate his response to medication changes.  Echocardiogram was scheduled for 10/01/2019.  He has just come back from a week at the beach.  He did admit to eating some salty fried seafood.  He denies any dyspnea, or excessive lower extremity edema.  He did spend a good bit of time in the ocean as well.  He is back down to taking 40 mg of Lasix a day.  His weight has been well maintained.  He denies chest discomfort or dyspnea on exertion.  He will be leaving next week to go to [place] state where he has a place him like to [place] where he goes to fish.  Past Medical History:  Diagnosis Date  . Atrial fibrillation with rapid ventricular response ([place]) 10/25/2018   Successful  DCCV shock x1 200 J 11/04/2018  . Chronic back pain    buldging disc  . Depression   . GERD (gastroesophageal reflux disease)   . GOUT    takes Allopurinol daily and Colchicine as needed  . History of bronchitis    many yrs ago  . History of colonoscopy    benign  . History of shingles   . Joint pain   .  Joint swelling   . Muscle spasm    takes Robaxin as needed  . Nocturia   . OSTEOARTHRITIS   . Peripheral edema    takes Lasix daily as needed  . Pneumonia 1969  . Seasonal allergies    takes Zyrtec and uses Flonase is needed  . TESTICULAR HYPOFUNCTION   . Zenker diverticula     Past Surgical History:  Procedure Laterality Date  . ANKLE SURGERY     4 times  . CAPSULOTOMY Right 08/20/2019   Procedure: SCAR CONTRACTURE AND CAPSULE RELEASE OF RIGHT BREAST TISSUE;  Surgeon: Wallace Going, DO;  Location: [facility];  Service: Plastics;  Laterality: Right;  . CARDIOVERSION N/A 11/04/2018   Procedure: CARDIOVERSION;  Surgeon: Acie Fredrickson Wonda Cheng, MD;  Location: [facility];  Service: Cardiovascular;  Laterality: N/A;  . COLONOSCOPY    . ELBOW SURGERY Right    torn tendon  . INGUINAL HERNIA REPAIR     right  . KNEE SURGERY     3 on each knee  . SCAR REVISION Right 08/20/2019   Procedure: SCAR REVISION;  Surgeon: Wallace Going, DO;  Location: [facility];  Service: Plastics;  Laterality: Right;  . SHOULDER ARTHROSCOPY Right 06/15/2015   Procedure: RIGHT  SHOULDER ARTHROSCOPY, ACROMIOPLASTY, DEBRIDEMENT;  Surgeon: Melrose Nakayama, MD;  Location: [facility];  Service: Orthopedics;  Laterality: Right;  . shoulder arthrosopy Left   . TONSILLECTOMY AND ADENOIDECTOMY    . TUMOR EXCISION     sebaceous cyst removed from chest  . ZENKER'S DIVERTICULECTOMY ENDOSCOPIC N/A 06/14/2016   Procedure: Stanford Breed DIVERTICULECTOMY ENDOSCOPIC;  Surgeon: Izora Gala, MD;  Location: [facility];  Service: ENT;  Laterality: N/A;     Current Outpatient Medications  Medication Sig Dispense Refill  . allopurinol (ZYLOPRIM) 100 MG tablet Take 300 mg by mouth daily.    Marland Kitchen allopurinol (ZYLOPRIM) 300 MG tablet Take 300 mg by mouth daily.    . ARTIFICIAL TEARS 0.2-0.2-1 % SOLN     . CALCIUM 600 1500 (600 Ca) MG TABS tablet Take 600 mg of elemental calcium by mouth 2 (two) times daily.     Marland Kitchen  diltiazem (CARDIZEM CD) 120 MG 24 hr capsule Take 1 capsule (120 mg total) by mouth daily as needed (IF YOUR HEART RATE GOES OVER 100). 90 capsule 1  . famotidine (PEPCID) 20 MG tablet Take 20 mg by mouth 2 (two) times a day.    . furosemide (LASIX) 40 MG tablet Take 1 tablet (40 mg total) by mouth daily. 1 tab twice daily for 5 days then resume 1 daily ./cy 35 tablet 6  . ibuprofen (ADVIL) 600 MG tablet     . indomethacin (INDOCIN) 50 MG capsule     . ipratropium (ATROVENT) 0.03 % nasal spray Place 1 spray into the nose daily as needed for rhinitis.     . magnesium oxide (MAG-OX) 400 MG tablet Take 1 tablet by mouth 2 (two) times daily.    . metoprolol tartrate (LOPRESSOR) 25 MG tablet Take 1 tablet (25 mg total) by mouth 2 (two) times daily. Hold SYSTOLIC BP <413, HR <24 30 tablet 0  . morphine (MS CONTIN) 15 MG 12 hr tablet Take 15 mg by mouth every 12 (twelve) hours.    . Multiple Vitamin (MULTIVITAMIN) tablet Take 1 tablet by mouth daily.    Marland Kitchen oxyCODONE (OXY IR/ROXICODONE) 5 MG immediate release tablet Take 1 tablet (5 mg total) by mouth every 4 (four) hours as needed for severe pain. In addition to current chronic pain medication 30 tablet 0  . potassium chloride SA (KLOR-CON) 20 MEQ tablet Take 20 mEq by mouth daily.    . sildenafil (VIAGRA) 100 MG tablet Take 100 mg by mouth daily as needed for erectile dysfunction.    . tamsulosin (FLOMAX) 0.4 MG CAPS capsule Take 0.4 mg by mouth.    . vitamin B-12 (CYANOCOBALAMIN) 1000 MCG tablet     . vitamin B-12 (CYANOCOBALAMIN) 1000 MCG tablet Take 1,000 mcg by mouth daily.     No current facility-administered medications for this visit.    Allergies:   Patient has no known allergies.    Social History:  The patient  reports that he has never smoked. He has never used smokeless tobacco. He reports current alcohol use of about 14.0 standard drinks of alcohol per week. He reports that he does not use drugs.   Family History:  The patient's Family  history is unknown by patient.    ROS: All other systems are reviewed and negative. Unless otherwise mentioned in H&P    PHYSICAL EXAM: VS:  BP 110/60   Pulse 99   Ht 6\' 1"  (1.854 m)   Wt 205 lb 6.4 oz (93.2 kg)   SpO2 98%  BMI 27.10 kg/m  , BMI Body mass index is 27.1 kg/m. GEN: Well nourished, well developed, in no acute distress HEENT: normal Neck: no JVD, carotid bruits, or masses Cardiac: IRRR; soft 1/6 systolic murmurs, rubs, or gallops, 1+ to 2+ dependent edema.  Skin redness with mild lacerations are noted on the pretibial area bilaterally. Respiratory:  Clear to auscultation bilaterally, normal work of breathing GI: soft, nontender, nondistended, + BS MS: no deformity or atrophy Skin: warm and dry, no rash Neuro:  Strength and sensation are intact Psych: euthymic mood, full affect   EKG: Not completed this office visit.  Recent Labs: 10/11/2018: B Natriuretic Peptide 234.9 10/12/2018: ALT 32; TSH 2.524 10/14/2018: Magnesium 1.6; Platelets 163 11/04/2018: Hemoglobin 12.2 09/09/2019: BUN 21; Creatinine, Ser 1.02; Potassium 4.9; Sodium 135    Lipid Panel    Component Value Date/Time   CHOL 143 11/08/2016 1116   TRIG 246.0 (H) 11/08/2016 1116   HDL 44.90 11/08/2016 1116   CHOLHDL 3 11/08/2016 1116   VLDL 49.2 (H) 11/08/2016 1116   LDLDIRECT 64.0 11/08/2016 1116      Wt Readings from Last 3 Encounters:  09/29/19 205 lb 6.4 oz (93.2 kg)  09/09/19 216 lb (98 kg)  08/29/19 210 lb (95.3 kg)      Other studies Reviewed: Awaiting echocardiogram report planned 10/01/2019.  Echocardiogram 10/12/2018 1. The left ventricle has normal systolic function, with an ejection  fraction of 55-60%. The cavity size was normal. There is mildly increased  left ventricular wall thickness. Left ventricular diastolic Doppler  parameters are consistent with impaired  relaxation.  2. The right ventricle has normal systolic function. The cavity was  normal. There is no increase in  right ventricular wall thickness. Right  ventricular systolic pressure is mildly elevated with an estimated  pressure of 36.0 mmHg.  3. Left atrial size was mildly dilated.  4. There is mild mitral annular calcification present.  5. The aortic valve is tricuspid. Mild thickening of the aortic valve.  Mild calcification of the aortic valve. Aortic valve regurgitation is  trivial by color flow Doppler.  6. The inferior vena cava was dilated in size with <50% respiratory  variability.   ASSESSMENT AND PLAN:  1.  Atrial fibrillation: Not on anticoagulation as he has a CHADS VASC Score of 1.  Heart rate is currently well controlled.  He remains on metoprolol 25 mg twice daily.  No changes to his medication regimen.  Echocardiogram is planned in the next 2 days.  2.  Chronic lower extremity edema: Multifactorial in the setting of diltiazem and probable vascular insufficiency.  He does admit to eating salty foods as well.  He has gone back to Lasix 40 mg daily from taking double the dose earlier this month.  He is doing well and have no complaints.  He does have some mild dependent lower extremity edema.  He does not appear to be significantly fluid overloaded.  Weight is stable.  He will continue to take his Lasix as directed.  If edema does not improve he will call us.  3.  Chronic combined systolic diastolic heart failure.  Most recent echocardiogram in 2020 revealed normal ejection fraction.  Repeat echocardiogram is being completed in the next couple of days.  Current medicines are reviewed at length with the patient today.  I have spent 25 minutes  dedicated to the care of this patient on the date of this encounter to include pre-visit review of records, assessment, management and diagnostic testing,with shared  decision making.   Labs/ tests ordered today include: None  Awaiting echo Phill Myron. West Pugh, ANP, AACC   09/29/2019 3:35 PM    [facility] [address] Office [phone] Fax [phone]  Notice: This dictation was prepared with Dragon dictation along with smaller phrase technology. Any transcriptional errors that result from this process are unintentional and may not be corrected upon review.

## 2019-09-29 ENCOUNTER — Encounter: Payer: Self-pay | Admitting: Adult Health

## 2019-09-29 ENCOUNTER — Ambulatory Visit (INDEPENDENT_AMBULATORY_CARE_PROVIDER_SITE_OTHER): Payer: Medicare Other | Admitting: Adult Health

## 2019-09-29 ENCOUNTER — Other Ambulatory Visit: Payer: Self-pay

## 2019-09-29 VITALS — BP 110/60 | HR 99 | Ht 73.0 in | Wt 205.4 lb

## 2019-09-29 DIAGNOSIS — I5042 Chronic combined systolic (congestive) and diastolic (congestive) heart failure: Secondary | ICD-10-CM | POA: Diagnosis not present

## 2019-09-29 DIAGNOSIS — I872 Venous insufficiency (chronic) (peripheral): Secondary | ICD-10-CM

## 2019-09-29 DIAGNOSIS — I4821 Permanent atrial fibrillation: Secondary | ICD-10-CM

## 2019-09-29 DIAGNOSIS — I10 Essential (primary) hypertension: Secondary | ICD-10-CM

## 2019-09-29 DIAGNOSIS — I358 Other nonrheumatic aortic valve disorders: Secondary | ICD-10-CM

## 2019-09-29 NOTE — Patient Instructions (Signed)
Medication Instructions:  Continue current medications  *If you need a refill on your cardiac medications before your next appointment, please call your pharmacy*   Lab Work: None Ordered  Testing/Procedures: None ordered   Follow-Up: At [facility], you and your health needs are our priority.  As part of our continuing mission to provide you with exceptional heart care, we have created designated Provider Care Teams.  These Care Teams include your primary Cardiologist (physician) and Advanced Practice Providers (APPs -  Physician Assistants and Nurse Practitioners) who all work together to provide you with the care you need, when you need it.  We recommend signing up for the patient portal called "MyChart".  Sign up information is provided on this After Visit Summary.  MyChart is used to connect with patients for Virtual Visits (Telemedicine).  Patients are able to view lab/test results, encounter notes, upcoming appointments, etc.  Non-urgent messages can be sent to your provider as well.   To learn more about what you can do with MyChart, go to [email or web address].    Your next appointment:   Keep appointment on September 21st 2:30 pm  The format for your next appointment:   In Person  Provider:   Quay Burow, MD

## 2019-10-01 ENCOUNTER — Ambulatory Visit (HOSPITAL_COMMUNITY): Payer: Medicare Other | Attending: Cardiovascular Disease

## 2019-10-01 ENCOUNTER — Other Ambulatory Visit: Payer: Self-pay

## 2019-10-01 DIAGNOSIS — R600 Localized edema: Secondary | ICD-10-CM | POA: Insufficient documentation

## 2019-12-22 ENCOUNTER — Emergency Department
Admission: EM | Admit: 2019-12-22 | Discharge: 2019-12-22 | Disposition: A | Discharge status: Home / Self Care | Payer: Medicare Other | Source: Ambulatory Visit | Attending: Physician Assistant | Admitting: Physician Assistant

## 2019-12-22 ENCOUNTER — Emergency Department: Payer: Medicare Other | Admitting: Radiology

## 2019-12-22 DIAGNOSIS — R609 Edema, unspecified: Secondary | ICD-10-CM

## 2019-12-22 DIAGNOSIS — E875 Hyperkalemia: Secondary | ICD-10-CM | POA: Insufficient documentation

## 2019-12-22 DIAGNOSIS — R600 Localized edema: Secondary | ICD-10-CM

## 2019-12-22 DIAGNOSIS — J90 Pleural effusion, not elsewhere classified: Secondary | ICD-10-CM

## 2019-12-22 DIAGNOSIS — M7989 Other specified soft tissue disorders: Secondary | ICD-10-CM

## 2019-12-22 DIAGNOSIS — I4892 Unspecified atrial flutter: Secondary | ICD-10-CM

## 2019-12-22 DIAGNOSIS — I4891 Unspecified atrial fibrillation: Secondary | ICD-10-CM | POA: Insufficient documentation

## 2019-12-22 DIAGNOSIS — R9431 Abnormal electrocardiogram [ECG] [EKG]: Secondary | ICD-10-CM

## 2019-12-22 HISTORY — DX: Gastro-esophageal reflux disease without esophagitis: K21.9

## 2019-12-22 HISTORY — DX: Unspecified atrial fibrillation: I48.91

## 2019-12-22 HISTORY — DX: Testicular hypofunction: E29.1

## 2019-12-22 HISTORY — DX: Edema, unspecified: R60.9

## 2019-12-22 HISTORY — DX: Depression, unspecified: F32.A

## 2019-12-22 HISTORY — DX: Unspecified osteoarthritis, unspecified site: M19.90

## 2019-12-22 HISTORY — DX: Gout, unspecified: M10.9

## 2019-12-22 HISTORY — DX: Dorsalgia, unspecified: M54.9

## 2019-12-22 HISTORY — DX: Other chronic pain: G89.29

## 2019-12-22 LAB — CBC AND DIFFERENTIAL
Baso # K/uL: 0 10*3/uL (ref 0.0–0.1)
Basophil %: 0.5 %
Eos # K/uL: 0 10*3/uL (ref 0.0–0.5)
Eosinophil %: 0.2 %
Hematocrit: 38 % — ABNORMAL LOW (ref 40–51)
Hemoglobin: 12.5 g/dL — ABNORMAL LOW (ref 13.7–17.5)
IMM Granulocytes #: 0 10*3/uL (ref 0.0–0.0)
IMM Granulocytes: 0.5 %
Lymph # K/uL: 0.5 10*3/uL — ABNORMAL LOW (ref 1.3–3.6)
Lymphocyte %: 7.9 %
MCH: 35 pg — ABNORMAL HIGH (ref 26–32)
MCHC: 33 g/dL (ref 32–37)
MCV: 107 fL — ABNORMAL HIGH (ref 79–92)
Mono # K/uL: 0.1 10*3/uL — ABNORMAL LOW (ref 0.3–0.8)
Monocyte %: 2.5 %
Neut # K/uL: 5 10*3/uL (ref 1.8–5.4)
Nucl RBC # K/uL: 0 10*3/uL (ref 0.0–0.0)
Nucl RBC %: 0 /100 WBC (ref 0.0–0.2)
Platelets: 145 10*3/uL — ABNORMAL LOW (ref 150–330)
RBC: 3.6 MIL/uL — ABNORMAL LOW (ref 4.6–6.1)
RDW: 12.4 % (ref 11.6–14.4)
Seg Neut %: 88.4 %
WBC: 5.7 10*3/uL (ref 4.2–9.1)

## 2019-12-22 LAB — COMPREHENSIVE METABOLIC PANEL
ALT: 13 U/L (ref 0–50)
AST: 31 U/L (ref 0–50)
Albumin: 4.5 g/dL (ref 3.5–5.2)
Alk Phos: 195 U/L — ABNORMAL HIGH (ref 40–130)
Anion Gap: 13 (ref 7–16)
Bilirubin,Total: 0.9 mg/dL (ref 0.0–1.2)
CO2: 25 mmol/L (ref 20–28)
Calcium: 9.4 mg/dL (ref 8.6–10.2)
Chloride: 97 mmol/L (ref 96–108)
Creatinine: 0.97 mg/dL (ref 0.67–1.17)
GFR,Black: 88 *
GFR,Caucasian: 76 *
Glucose: 111 mg/dL — ABNORMAL HIGH (ref 60–99)
Lab: 15 mg/dL (ref 6–20)
Potassium: 5.3 mmol/L — ABNORMAL HIGH (ref 3.4–4.7)
Sodium: 135 mmol/L (ref 133–145)
Total Protein: 7.8 g/dL — ABNORMAL HIGH (ref 6.3–7.7)

## 2019-12-22 LAB — NT-PRO BNP: NT-pro BNP: 908 pg/mL — ABNORMAL HIGH (ref 0–900)

## 2019-12-22 MED ORDER — ACETAMINOPHEN 500 MG PO TABS *I*
1000.0000 mg | ORAL_TABLET | Freq: Once | ORAL | Status: AC
Start: 2019-12-22 — End: 2019-12-22
  Administered 2019-12-22: 1000 mg via ORAL
  Filled 2019-12-22: qty 2

## 2019-12-22 NOTE — Discharge Instructions (Addendum)
Increase lasix to 80mg  for the next 2 days.   [facility].   Return with fevers, chills, chest pain or SOB.   No sign of infection or blood clot.

## 2019-12-22 NOTE — ED Triage Notes (Signed)
Pt sent to ED from [facility] for evaluation of bilateral lower leg edema. Pt had bilateral cortisone shots to knees today. Denies shortness of breath when ambulating. Pt states he pain in bilateral knees 5/10.        Triage Note   Idelle Crouch, RN

## 2019-12-22 NOTE — ED Provider Notes (Signed)
History     Chief Complaint   Patient presents with    Leg Swelling     74yo male presents to the ED for evaluation of bilateral lower extremity edema sent in by the [place]. Pt reports hx of chronic leg swelling that he takes lasix daily for. Denies fevers, chills, chest pain or SOB. No calf pain or unilateral swelling. Pt has bilateral knee injections today for his OA and was advised to come the ED for further eval.           Medical/Surgical/Family History     Past Medical History:   Diagnosis Date    A-fib     Chronic back pain     Depression     GERD (gastroesophageal reflux disease)     Gout     Osteoarthritis     Peripheral edema     Testicular hypofunction         There is no problem list on file for this patient.           Past Surgical History:   Procedure Laterality Date    ELBOW ARTHROSCOPY      HERNIA REPAIR      KNEE SURGERY Bilateral     x3    MECKEL DIVERTICULUM EXCISION      SHOULDER ARTHROSCOPY      TONSILLECTOMY      TONSILLECTOMY AND ADENOIDECTOMY       History reviewed. No pertinent family history.       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   Respiratory: Negative for cough, chest tightness and shortness of breath.    Cardiovascular: Positive for leg swelling. Negative for chest pain.   Gastrointestinal: Negative for abdominal pain, constipation, diarrhea, nausea and vomiting.   Genitourinary: Negative for dysuria, flank pain and  frequency.   Musculoskeletal: Negative for myalgias.   Neurological: Negative for weakness, light-headedness, numbness and headaches.   All other systems reviewed and are negative.      Physical Exam     Triage Vitals  Triage Start: Start, (12/22/19 1535)   First Recorded BP: (!) 171/91, Resp: 19, Temp: 36.1 C (97 F), Temp src: TEMPORAL Oxygen Therapy SpO2: 100 %, Oximetry Source: Rt Hand, O2 Device: None (Room air), Heart Rate: 100, (12/22/19 1704) Heart Rate (via Pulse Ox): 86, (12/22/19 1537).  First Pain Reported  0-10 Scale: 5, Pain Location/Orientation: Knee Left; Knee Right, (12/22/19 1537)       Physical Exam  Vitals and nursing note reviewed.   Constitutional:       Appearance: Normal appearance. He is well-developed.   HENT:      Head: Normocephalic and atraumatic.  Eyes:      Extraocular Movements: Extraocular movements intact.      Pupils: Pupils are equal, round, and reactive to light.   Cardiovascular:      Rate and Rhythm: Normal rate and regular rhythm.      Heart sounds: Normal heart sounds.   Pulmonary:      Effort: Pulmonary effort is normal.      Breath sounds: Normal breath sounds.   Abdominal:      General: Abdomen is flat. Bowel sounds are normal.      Palpations: Abdomen is soft.      Tenderness: There is no abdominal tenderness.   Musculoskeletal:         General: Normal range of motion.      Cervical back: Normal range of motion and neck supple.      Right lower leg: 1+ Edema present.      Left lower leg: 1+ Edema present.      Comments: 2+ DP pulse. Normal sensation. Bilateral leg swelling noted with mild erythema and shiny skin. No increased warmth. No weeping.    Skin:     General: Skin is warm and dry.   Neurological:      Mental Status: He is alert and oriented to person, place, and time.         Medical Decision Making   Patient seen by me on:  12/22/2019    Assessment:  74yo male presents to the ED for evaluation of bilateral leg swelling.     Differential diagnosis:  CHF,  peripheral edema, cellulitis, electrolyte derangement     Plan:  Orders Placed This Encounter      *Chest standard frontal and lateral views      CBC and differential      Comprehensive metabolic panel      NT-pro BNP      EKG 12 lead (initial)        EKG Interpretation: Atrial fibrillation     Independent review of: Existing labs    ED Course and Disposition:  Mild hyperkalemia and elevated BNP.   Discharged with recommendation to increased lasix x 2 days and close PCP follow up.     Discussed case with Dr. Virginia Grimesland. Provider agrees with work up and treatment plan.               [address] [place]          Pilar Jarvis [place], [place]  12/22/19 2152

## 2019-12-23 ENCOUNTER — Ambulatory Visit: Payer: Medicare Other | Admitting: Cardiovascular Disease

## 2019-12-23 LAB — EKG 12-LEAD
QRS: 11 deg
QRSD: 104 ms
QT: 365 ms
QTc: 449 ms
Rate: 91 {beats}/min
T: 113 deg

## 2020-01-20 ENCOUNTER — Other Ambulatory Visit: Payer: Self-pay

## 2020-01-20 ENCOUNTER — Encounter: Payer: Self-pay | Admitting: Cardiovascular Disease

## 2020-01-20 ENCOUNTER — Ambulatory Visit (INDEPENDENT_AMBULATORY_CARE_PROVIDER_SITE_OTHER): Payer: Medicare Other | Admitting: Cardiovascular Disease

## 2020-01-20 DIAGNOSIS — I480 Paroxysmal atrial fibrillation: Secondary | ICD-10-CM

## 2020-01-20 DIAGNOSIS — I5043 Acute on chronic combined systolic (congestive) and diastolic (congestive) heart failure: Secondary | ICD-10-CM

## 2020-01-20 DIAGNOSIS — R600 Localized edema: Secondary | ICD-10-CM | POA: Diagnosis not present

## 2020-01-20 NOTE — Assessment & Plan Note (Signed)
History of paroxysmal atrial fibrillation currently not on oral anticoagulation. The CHA2DSVASC2 score is  2, for age and congestive heart failure.  I am going refer him to the A. fib clinic to make a decision regarding appropriateness of oral anticoagulation.Marland Kitchen

## 2020-01-20 NOTE — Progress Notes (Signed)
01/20/2020 [name]   April 16, 1945  [id number]  Primary Physician [facility] Primary Cardiologist: Lorretta Harp MD Lupe Carney, [place]  HPI:  [name] is a 74 y.o.  mildly overweight married Caucasian male father of 2, grandfather of 1 grandchild who I initially saw in consultation11/3/2020when he was admitted with diastolic heart failure and A. fib with RVR.  I last saw him in the office 09/09/2019. He is retired from the [employer]. He does not smoke. He drinks daily but only 2 beers a night and occasional wine with dinner. He has no history of diabetes, hypertension or hyperlipidemia. Never had a heart attack or stroke. There is no family's for heart disease. He is fairly active. He did play division 3 football and hockey in his younger years. He is unaware that he is in A. fib at this point.  Since I saw him in the office 4 months ago, he is clinically improved.  He takes his Lasix as needed no swelling.  He has chronic 1-2+ lower extreme edema.  He is otherwise asymptomatic.  He is in normal sinus rhythm today.   Current Meds  Medication Sig  . allopurinol (ZYLOPRIM) 100 MG tablet Take 300 mg by mouth daily.  Marland Kitchen allopurinol (ZYLOPRIM) 300 MG tablet Take 300 mg by mouth daily.  . ARTIFICIAL TEARS 0.2-0.2-1 % SOLN   . CALCIUM 600 1500 (600 Ca) MG TABS tablet Take 600 mg of elemental calcium by mouth 2 (two) times daily.   . Calcium Carbonate-Vitamin D3 600-400 MG-UNIT TABS   . diltiazem (CARDIZEM CD) 120 MG 24 hr capsule Take 1 capsule (120 mg total) by mouth daily as needed (IF YOUR HEART RATE GOES OVER 100).  . famotidine (PEPCID) 20 MG tablet Take 20 mg by mouth 2 (two) times a day.  . furosemide (LASIX) 40 MG tablet Take 1 tablet (40 mg total) by mouth daily. 1 tab twice daily for 5 days then resume 1 daily ./cy  . ibuprofen (ADVIL) 600 MG tablet   . indomethacin (INDOCIN) 50 MG capsule   . ipratropium (ATROVENT) 0.03 % nasal spray Place  1 spray into the nose daily as needed for rhinitis.   . magnesium oxide (MAG-OX) 400 MG tablet Take 1 tablet by mouth 2 (two) times daily.  . metoprolol tartrate (LOPRESSOR) 25 MG tablet Take 1 tablet (25 mg total) by mouth 2 (two) times daily. Hold SYSTOLIC BP <751, HR <02  . morphine (MS CONTIN) 15 MG 12 hr tablet Take 15 mg by mouth every 12 (twelve) hours.  . Multiple Vitamin (MULTIVITAMIN) tablet Take 1 tablet by mouth daily.  Marland Kitchen oxyCODONE (OXY IR/ROXICODONE) 5 MG immediate release tablet Take 1 tablet (5 mg total) by mouth every 4 (four) hours as needed for severe pain. In addition to current chronic pain medication  . potassium chloride SA (KLOR-CON) 20 MEQ tablet Take 20 mEq by mouth daily.  . sildenafil (VIAGRA) 100 MG tablet Take 100 mg by mouth daily as needed for erectile dysfunction.  . vitamin B-12 (CYANOCOBALAMIN) 1000 MCG tablet   . vitamin B-12 (CYANOCOBALAMIN) 1000 MCG tablet Take 1,000 mcg by mouth daily.     No Known Allergies  Social History   Socioeconomic History  . Marital status: Married    Spouse name: Not on file  . Number of children: Not on file  . Years of education: Not on file  . Highest education level: Not on file  Occupational History  .  Not on file  Tobacco Use  . Smoking status: Never Smoker  . Smokeless tobacco: Never Used  Vaping Use  . Vaping Use: Never used  Substance and Sexual Activity  . Alcohol use: Yes    Alcohol/week: 14.0 standard drinks    Types: 10 Standard drinks or equivalent, 4 Cans of beer per week    Comment: daily  . Drug use: No  . Sexual activity: Not on file  Other Topics Concern  . Not on file  Social History Narrative  . Not on file   Social Determinants of Health   Financial Resource Strain:   . Difficulty of Paying Living Expenses: Not on file  Food Insecurity:   . Worried About Charity fundraiser in the Last Year: Not on file  . Ran Out of Food in the Last Year: Not on file  Transportation Needs:   . Lack  of Transportation (Medical): Not on file  . Lack of Transportation (Non-Medical): Not on file  Physical Activity:   . Days of Exercise per Week: Not on file  . Minutes of Exercise per Session: Not on file  Stress:   . Feeling of Stress : Not on file  Social Connections:   . Frequency of Communication with Friends and Family: Not on file  . Frequency of Social Gatherings with Friends and Family: Not on file  . Attends Religious Services: Not on file  . Active Member of Clubs or Organizations: Not on file  . Attends Archivist Meetings: Not on file  . Marital Status: Not on file  Intimate Partner Violence:   . Fear of Current or Ex-Partner: Not on file  . Emotionally Abused: Not on file  . Physically Abused: Not on file  . Sexually Abused: Not on file     Review of Systems: General: negative for chills, fever, night sweats or weight changes.  Cardiovascular: negative for chest pain, dyspnea on exertion, edema, orthopnea, palpitations, paroxysmal nocturnal dyspnea or shortness of breath Dermatological: negative for rash Respiratory: negative for cough or wheezing Urologic: negative for hematuria Abdominal: negative for nausea, vomiting, diarrhea, bright red blood per rectum, melena, or hematemesis Neurologic: negative for visual changes, syncope, or dizziness All other systems reviewed and are otherwise negative except as noted above.    Blood pressure 132/74, pulse 71, height 6\' 1"  (1.854 m), weight 196 lb (88.9 kg), SpO2 98 %.  General appearance: alert and no distress Neck: no adenopathy, no carotid bruit, no JVD, supple, symmetrical, trachea midline and thyroid not enlarged, symmetric, no tenderness/mass/nodules Lungs: clear to auscultation bilaterally Heart: regular rate and rhythm, S1, S2 normal, no murmur, click, rub or gallop Extremities: 1-2+ pitting lower extremity edema Pulses: 2+ and symmetric Skin: Mild this reddish discoloration of both lower  extremities Neurologic: Alert and oriented X 3, normal strength and tone. Normal symmetric reflexes. Normal coordination and gait  EKG sinus rhythm at 74 with incomplete right bundle branch block.  I personally reviewed this EKG.  ASSESSMENT AND PLAN:   Acute on chronic combined systolic and diastolic CHF (congestive heart failure) [facility]) [name] has combined systolic and diastolic heart failure.  His last 2D echocardiogram performed 10/01/2019 revealed ejection fraction of 45 to 50% with mild global hypokinesia.  His left atrium was severely dilated.  He was taking diuretics daily but now he takes it as needed.  He does have 1-2+ pitting lower extremity edema.  Lower extremity edema [name] has 1-2+ pitting lower extremity edema.  He was on Lasix daily but now takes it as needed because of excessive urination.  Atrial fibrillation (HCC) History of paroxysmal atrial fibrillation currently not on oral anticoagulation. The CHA2DSVASC2 score is  2, for age and congestive heart failure.  I am going refer him to the A. fib clinic to make a decision regarding appropriateness of oral anticoagulation.Lorretta Harp MD FACP,FACC,FAHA, [facility] 01/20/2020 5:01 PM

## 2020-01-20 NOTE — Patient Instructions (Signed)
Medication Instructions:  Your physician recommends that you continue on your current medications as directed. Please refer to the Current Medication list given to you today.  *If you need a refill on your cardiac medications before your next appointment, please call your pharmacy*  Lab Work: NONE ordered at this time of appointment   If you have labs (blood work) drawn today and your tests are completely normal, you will receive your results only by: Marland Kitchen MyChart Message (if you have MyChart) OR . A paper copy in the mail If you have any lab test that is abnormal or we need to change your treatment, we will call you to review the results.  Testing/Procedures: NONE ordered at this time of appointment   Follow-Up: At [facility], you and your health needs are our priority.  As part of our continuing mission to provide you with exceptional heart care, we have created designated Provider Care Teams.  These Care Teams include your primary Cardiologist (physician) and Advanced Practice Providers (APPs -  Physician Assistants and Nurse Practitioners) who all work together to provide you with the care you need, when you need it.   Your next appointment:   6 month(s)  The format for your next appointment:   In Person  Provider:   You will see one of the following Advanced Practice Providers on your designated Care Team:    Kerin Ransom, PA-C  [place], [place]  Coletta Memos, [place]  Then, Quay Burow, MD will plan to see you again in 1 year(s).    Other Instructions You have been referred to Afib clinic

## 2020-01-20 NOTE — Assessment & Plan Note (Signed)
Mr. [name] has 1-2+ pitting lower extremity edema.  He was on Lasix daily but now takes it as needed because of excessive urination.

## 2020-01-20 NOTE — Assessment & Plan Note (Signed)
Mr. [name] has combined systolic and diastolic heart failure.  His last 2D echocardiogram performed 10/01/2019 revealed ejection fraction of 45 to 50% with mild global hypokinesia.  His left atrium was severely dilated.  He was taking diuretics daily but now he takes it as needed.  He does have 1-2+ pitting lower extremity edema.

## 2020-01-21 ENCOUNTER — Telehealth: Payer: Self-pay | Admitting: Cardiovascular Disease

## 2020-01-21 NOTE — Telephone Encounter (Signed)
Left message for patient regarding appointment for A Fib clinic scheduled Tuesday 01/27/20 at 3:30 ---time is 3:15 pm 1st floor admissions office---requested patient to call with questions and information is also in My Chart.

## 2020-01-27 ENCOUNTER — Ambulatory Visit (HOSPITAL_COMMUNITY): Payer: Medicare Other | Admitting: Physician Assistant

## 2020-01-28 ENCOUNTER — Encounter (HOSPITAL_COMMUNITY): Payer: Self-pay | Admitting: Physician Assistant

## 2020-01-28 ENCOUNTER — Other Ambulatory Visit: Payer: Self-pay

## 2020-01-28 ENCOUNTER — Ambulatory Visit (HOSPITAL_COMMUNITY)
Admission: RE | Admit: 2020-01-28 | Discharge: 2020-01-28 | Disposition: A | Discharge status: Home / Self Care | Payer: Medicare Other | Source: Ambulatory Visit | Attending: Physician Assistant | Admitting: Physician Assistant

## 2020-01-28 VITALS — BP 140/76 | HR 81 | Ht 73.0 in | Wt 199.0 lb

## 2020-01-28 DIAGNOSIS — Z7901 Long term (current) use of anticoagulants: Secondary | ICD-10-CM | POA: Insufficient documentation

## 2020-01-28 DIAGNOSIS — I5042 Chronic combined systolic (congestive) and diastolic (congestive) heart failure: Secondary | ICD-10-CM | POA: Insufficient documentation

## 2020-01-28 DIAGNOSIS — D6869 Other thrombophilia: Secondary | ICD-10-CM | POA: Diagnosis not present

## 2020-01-28 DIAGNOSIS — I480 Paroxysmal atrial fibrillation: Secondary | ICD-10-CM

## 2020-01-28 DIAGNOSIS — I4892 Unspecified atrial flutter: Secondary | ICD-10-CM | POA: Insufficient documentation

## 2020-01-28 LAB — BASIC METABOLIC PANEL
Anion gap: 11 (ref 5–15)
BUN: 5 mg/dL — ABNORMAL LOW (ref 8–23)
CO2: 28 mmol/L (ref 22–32)
Calcium: 9.3 mg/dL (ref 8.9–10.3)
Chloride: 99 mmol/L (ref 98–111)
Creatinine, Ser: 0.77 mg/dL (ref 0.61–1.24)
GFR, Estimated: >60 mL/min (ref >60)
Glucose, Bld: 116 mg/dL — ABNORMAL HIGH (ref 70–99)
Potassium: 4.2 mmol/L (ref 3.5–5.1)
Sodium: 138 mmol/L (ref 135–145)

## 2020-01-28 LAB — CBC
HCT: 37.3 % — ABNORMAL LOW (ref 39.0–52.0)
Hemoglobin: 12.7 g/dL — ABNORMAL LOW (ref 13.0–17.0)
MCH: 35.4 pg — ABNORMAL HIGH (ref 26.0–34.0)
MCHC: 34 g/dL (ref 30.0–36.0)
MCV: 103.9 fL — ABNORMAL HIGH (ref 80.0–100.0)
Platelets: 150 10*3/uL (ref 150–400)
RBC: 3.59 MIL/uL — ABNORMAL LOW (ref 4.22–5.81)
RDW: 12.3 % (ref 11.5–15.5)
WBC: 4.8 10*3/uL (ref 4.0–10.5)
nRBC: 0 % (ref 0.0–0.2)

## 2020-01-28 MED ORDER — APIXABAN 5 MG PO TABS
5.0000 mg | ORAL_TABLET | Freq: Two times a day (BID) | ORAL | 6 refills | Status: DC
Start: 2020-01-28 — End: 2020-03-02

## 2020-01-28 NOTE — Progress Notes (Signed)
Primary Care Physician: [facility] Primary Cardiologist: Dr Gwenlyn Found Primary Electrophysiologist: none Referring Physician: Dr Floria Raveling is a 74 y.o. male with a history of combined systolic/diastolic CHF, Zenker's diverticulum, atrial flutter and paroxysmal atrial fibrillation who presents for consultation in the [facility].  The patient was initially diagnosed with atrial fibrillation 10/2018 after presenting with symptoms of increased SOB, weight gain, and lower extremity edema. Patient was started on Eliquis for a CHADS2VASC score of 1 and underwent DCCV on 11/04/19. His Eliquis was later discontinued with a low CHADS2VASC score. He was seen in follow up 09/29/19 and a repeat echo was ordered which showed decreased EF 45-50%. His CHADS2VASC score is now 2 and will soon be 3 when he turns 36 in March. He denies any awareness of his arrhythmia. He does have a home BP machine which will show if he is out of rhythm. He has episodes about every 3 weeks which will last about 24 hours. His heart rate is controlled even when he is out of rhythm.   Today, he denies symptoms of palpitations, chest pain, shortness of breath, orthopnea, PND, lower extremity edema, dizziness, presyncope, syncope, snoring, daytime somnolence, bleeding, or neurologic sequela. The patient is tolerating medications without difficulties and is otherwise without complaint today.    Atrial Fibrillation Risk Factors:  he does not have symptoms or diagnosis of sleep apnea. he does not have a history of rheumatic fever. he does have a history of alcohol use. The patient does not have a history of early familial atrial fibrillation or other arrhythmias.  he has a BMI of Body mass index is 26.25 kg/m.Marland Kitchen Filed Weights   01/28/20 1438  Weight: 90.3 kg    Family History  Family history unknown: Yes     Atrial Fibrillation Management history:  Previous antiarrhythmic  drugs: none Previous cardioversions: 11/04/19 Previous ablations: none CHADS2VASC score: 2 Anticoagulation history: Eliquis   Past Medical History:  Diagnosis Date  . Atrial fibrillation with rapid ventricular response ([place]) 10/25/2018   Successful  DCCV shock x1 200 J 11/04/2018  . Chronic back pain    buldging disc  . Depression   . GERD (gastroesophageal reflux disease)   . GOUT    takes Allopurinol daily and Colchicine as needed  . History of bronchitis    many yrs ago  . History of colonoscopy    benign  . History of shingles   . Joint pain   . Joint swelling   . Muscle spasm    takes Robaxin as needed  . Nocturia   . OSTEOARTHRITIS   . Peripheral edema    takes Lasix daily as needed  . Pneumonia 1969  . Seasonal allergies    takes Zyrtec and uses Flonase is needed  . TESTICULAR HYPOFUNCTION   . Zenker diverticula    Past Surgical History:  Procedure Laterality Date  . ANKLE SURGERY     4 times  . CAPSULOTOMY Right 08/20/2019   Procedure: SCAR CONTRACTURE AND CAPSULE RELEASE OF RIGHT BREAST TISSUE;  Surgeon: Wallace Going, DO;  Location: [place];  Service: Plastics;  Laterality: Right;  . CARDIOVERSION N/A 11/04/2018   Procedure: CARDIOVERSION;  Surgeon: Acie Fredrickson Wonda Cheng, MD;  Location: [place];  Service: Cardiovascular;  Laterality: N/A;  . COLONOSCOPY    . ELBOW SURGERY Right    torn tendon  . INGUINAL HERNIA REPAIR     right  . KNEE SURGERY  3 on each knee  . SCAR REVISION Right 08/20/2019   Procedure: SCAR REVISION;  Surgeon: Wallace Going, DO;  Location: [place];  Service: Plastics;  Laterality: Right;  . SHOULDER ARTHROSCOPY Right 06/15/2015   Procedure: RIGHT SHOULDER ARTHROSCOPY, ACROMIOPLASTY, DEBRIDEMENT;  Surgeon: Melrose Nakayama, MD;  Location: [place];  Service: Orthopedics;  Laterality: Right;  . shoulder arthrosopy Left   . TONSILLECTOMY AND ADENOIDECTOMY    . TUMOR EXCISION     sebaceous cyst  removed from chest  . ZENKER'S DIVERTICULECTOMY ENDOSCOPIC N/A 06/14/2016   Procedure: Stanford Breed DIVERTICULECTOMY ENDOSCOPIC;  Surgeon: Izora Gala, MD;  Location: [place];  Service: ENT;  Laterality: N/A;    Current Outpatient Medications  Medication Sig Dispense Refill  . allopurinol (ZYLOPRIM) 100 MG tablet Take 300 mg by mouth daily.    . ARTIFICIAL TEARS 0.2-0.2-1 % SOLN     . CALCIUM 600 1500 (600 Ca) MG TABS tablet Take 600 mg of elemental calcium by mouth 2 (two) times daily.     . Calcium Carbonate-Vitamin D3 600-400 MG-UNIT TABS     . diltiazem (CARDIZEM CD) 120 MG 24 hr capsule Take 1 capsule (120 mg total) by mouth daily as needed (IF YOUR HEART RATE GOES OVER 100). 90 capsule 1  . famotidine (PEPCID) 20 MG tablet Take 20 mg by mouth 2 (two) times a day.    . furosemide (LASIX) 40 MG tablet Take 1 tablet (40 mg total) by mouth daily. 1 tab twice daily for 5 days then resume 1 daily ./cy 35 tablet 6  . ibuprofen (ADVIL) 600 MG tablet     . indomethacin (INDOCIN) 50 MG capsule     . ipratropium (ATROVENT) 0.03 % nasal spray Place 1 spray into the nose daily as needed for rhinitis.     . magnesium oxide (MAG-OX) 400 MG tablet Take 1 tablet by mouth 2 (two) times daily.    . metoprolol tartrate (LOPRESSOR) 25 MG tablet Take 1 tablet (25 mg total) by mouth 2 (two) times daily. Hold SYSTOLIC BP <161, HR <09 30 tablet 0  . morphine (MS CONTIN) 15 MG 12 hr tablet Take 15 mg by mouth every 12 (twelve) hours.    . Multiple Vitamin (MULTIVITAMIN) tablet Take 1 tablet by mouth daily.    Marland Kitchen oxyCODONE (OXY IR/ROXICODONE) 5 MG immediate release tablet Take 1 tablet (5 mg total) by mouth every 4 (four) hours as needed for severe pain. In addition to current chronic pain medication 30 tablet 0  . potassium chloride SA (KLOR-CON) 20 MEQ tablet Take 20 mEq by mouth daily.    . sildenafil (VIAGRA) 100 MG tablet Take 100 mg by mouth daily as needed for erectile dysfunction.    . tamsulosin (FLOMAX) 0.4 MG  CAPS capsule Take 0.4 mg by mouth.     . vitamin B-12 (CYANOCOBALAMIN) 1000 MCG tablet      No current facility-administered medications for this encounter.    No Known Allergies  Social History   Socioeconomic History  . Marital status: Married    Spouse name: Not on file  . Number of children: Not on file  . Years of education: Not on file  . Highest education level: Not on file  Occupational History  . Not on file  Tobacco Use  . Smoking status: Never Smoker  . Smokeless tobacco: Never Used  Vaping Use  . Vaping Use: Never used  Substance and Sexual Activity  . Alcohol use: Yes  Alcohol/week: 14.0 standard drinks    Types: 10 Standard drinks or equivalent, 4 Cans of beer per week    Comment: daily  . Drug use: No  . Sexual activity: Not on file  Other Topics Concern  . Not on file  Social History Narrative  . Not on file   Social Determinants of Health   Financial Resource Strain:   . Difficulty of Paying Living Expenses: Not on file  Food Insecurity:   . Worried About Charity fundraiser in the Last Year: Not on file  . Ran Out of Food in the Last Year: Not on file  Transportation Needs:   . Lack of Transportation (Medical): Not on file  . Lack of Transportation (Non-Medical): Not on file  Physical Activity:   . Days of Exercise per Week: Not on file  . Minutes of Exercise per Session: Not on file  Stress:   . Feeling of Stress : Not on file  Social Connections:   . Frequency of Communication with Friends and Family: Not on file  . Frequency of Social Gatherings with Friends and Family: Not on file  . Attends Religious Services: Not on file  . Active Member of Clubs or Organizations: Not on file  . Attends Archivist Meetings: Not on file  . Marital Status: Not on file  Intimate Partner Violence:   . Fear of Current or Ex-Partner: Not on file  . Emotionally Abused: Not on file  . Physically Abused: Not on file  . Sexually Abused: Not on  file     ROS- All systems are reviewed and negative except as per the HPI above.  Physical Exam: Vitals:   01/28/20 1438  BP: 140/76  Pulse: 81  Weight: 90.3 kg  Height: 6\' 1"  (1.854 m)    GEN- The patient is well appearing elderly male, alert and oriented x 3 today.   Head- normocephalic, atraumatic Eyes-  Sclera clear, conjunctiva pink Ears- hearing intact Oropharynx- clear Neck- supple  Lungs- Clear to ausculation bilaterally, normal work of breathing Heart- Regular rate and rhythm, no murmurs, rubs or gallops  GI- soft, NT, ND, + BS Extremities- no clubbing, cyanosis, or edema MS- no significant deformity or atrophy Skin- no rash or lesion Psych- euthymic mood, full affect Neuro- strength and sensation are intact  Wt Readings from Last 3 Encounters:  01/28/20 90.3 kg  01/20/20 88.9 kg  09/29/19 93.2 kg    EKG today demonstrates SR HR 81, PR 180, QRS 96, QTc 434  Echo 10/01/19 demonstrated  1. Left ventricular ejection fraction, by estimation, is 45 to 50%. The  left ventricle has mildly decreased function. The left ventricle  demonstrates global hypokinesis. Left ventricular diastolic function could  not be evaluated.  2. Right ventricular systolic function is moderately reduced. The right  ventricular size is severely enlarged. There is moderately elevated  pulmonary artery systolic pressure.  3. Left atrial size was severely dilated.  4. Right atrial size was severely dilated.  5. The mitral valve is normal in structure. Mild mitral valve  regurgitation.  6. Tricuspid valve regurgitation is mild to moderate.  7. The aortic valve is tricuspid. Aortic valve regurgitation is trivial.  Mild aortic valve sclerosis is present, with no evidence of aortic valve  stenosis.  8. The inferior vena cava is dilated in size with <50% respiratory  variability, suggesting right atrial pressure of 15 mmHg.   Comparison(s): The left ventricular function is worsened.  The right  ventricular systolic function is worse.   Epic records are reviewed at length today  CHA2DS2-VASc Score = 2  The patient's score is based upon: CHF History: 1 HTN History: 0 Diabetes History: 0 Stroke History: 0 Vascular Disease History: 0 Age Score: 1 Gender Score: 0      ASSESSMENT AND PLAN: 1. Paroxysmal Atrial Fibrillation/atrial flutter The patient's CHA2DS2-VASc score is 2, indicating a 2.2% annual risk of stroke.   General education about afib provided and questions answered. We also discussed his stroke risk and the risks and benefits of anticoagulation. Will plan to resume Eliquis 5 mg BID Check bmet/cbc today Continue diltiazem 120 mg daily Continue Lopressor 25 mg BID Lifestyle modification was discussed including alcohol reduction.  2. Secondary Hypercoagulable State (ICD10:  D68.69) The patient is at significant risk for stroke/thromboembolism based upon his CHA2DS2-VASc Score of 2.  Start Apixaban (Eliquis).    3. Chronic combined systolic/diastolic CHF EF 57-84% No signs or symptoms of fluid overload today. ? If ischemic workup needed. Will defer to primary cardiologist.    Follow up in the AF clinic in one month.    [facility] [address] [place], [place] [address] [phone] 01/28/2020 2:52 PM

## 2020-03-02 ENCOUNTER — Other Ambulatory Visit: Payer: Self-pay

## 2020-03-02 ENCOUNTER — Encounter (HOSPITAL_COMMUNITY): Payer: Self-pay | Admitting: Physician Assistant

## 2020-03-02 ENCOUNTER — Ambulatory Visit (HOSPITAL_COMMUNITY)
Admission: RE | Admit: 2020-03-02 | Discharge: 2020-03-02 | Disposition: A | Discharge status: Home / Self Care | Payer: Medicare Other | Source: Ambulatory Visit | Attending: Physician Assistant | Admitting: Physician Assistant

## 2020-03-02 VITALS — BP 138/68 | HR 68 | Ht 73.0 in | Wt 202.2 lb

## 2020-03-02 DIAGNOSIS — Z79899 Other long term (current) drug therapy: Secondary | ICD-10-CM | POA: Diagnosis not present

## 2020-03-02 DIAGNOSIS — I4892 Unspecified atrial flutter: Secondary | ICD-10-CM | POA: Diagnosis not present

## 2020-03-02 DIAGNOSIS — I480 Paroxysmal atrial fibrillation: Secondary | ICD-10-CM | POA: Diagnosis present

## 2020-03-02 DIAGNOSIS — R5383 Other fatigue: Secondary | ICD-10-CM | POA: Diagnosis not present

## 2020-03-02 DIAGNOSIS — I5042 Chronic combined systolic (congestive) and diastolic (congestive) heart failure: Secondary | ICD-10-CM | POA: Insufficient documentation

## 2020-03-02 DIAGNOSIS — D6869 Other thrombophilia: Secondary | ICD-10-CM | POA: Insufficient documentation

## 2020-03-02 LAB — TSH: TSH: 3.08 u[IU]/mL (ref 0.350–4.500)

## 2020-03-02 LAB — BASIC METABOLIC PANEL
Anion gap: 12 (ref 5–15)
BUN: 8 mg/dL (ref 8–23)
CO2: 27 mmol/L (ref 22–32)
Calcium: 8.8 mg/dL — ABNORMAL LOW (ref 8.9–10.3)
Chloride: 100 mmol/L (ref 98–111)
Creatinine, Ser: 0.87 mg/dL (ref 0.61–1.24)
GFR, Estimated: >60 mL/min (ref >60)
Glucose, Bld: 114 mg/dL — ABNORMAL HIGH (ref 70–99)
Potassium: 4.2 mmol/L (ref 3.5–5.1)
Sodium: 139 mmol/L (ref 135–145)

## 2020-03-02 LAB — CBC
HCT: 36.1 % — ABNORMAL LOW (ref 39.0–52.0)
Hemoglobin: 12.3 g/dL — ABNORMAL LOW (ref 13.0–17.0)
MCH: 35.7 pg — ABNORMAL HIGH (ref 26.0–34.0)
MCHC: 34.1 g/dL (ref 30.0–36.0)
MCV: 104.6 fL — ABNORMAL HIGH (ref 80.0–100.0)
Platelets: 162 10*3/uL (ref 150–400)
RBC: 3.45 MIL/uL — ABNORMAL LOW (ref 4.22–5.81)
RDW: 12.9 % (ref 11.5–15.5)
WBC: 4.6 10*3/uL (ref 4.0–10.5)
nRBC: 0 % (ref 0.0–0.2)

## 2020-03-02 MED ORDER — APIXABAN 5 MG PO TABS
5.0000 mg | ORAL_TABLET | Freq: Two times a day (BID) | ORAL | 6 refills | Status: DC
Start: 2020-03-02 — End: 2020-03-04

## 2020-03-02 NOTE — Progress Notes (Signed)
Primary Care Physician: [facility] Primary Cardiologist: Dr Gwenlyn Found Primary Electrophysiologist: none Referring Physician: Dr Floria Raveling is a 74 y.o. male with a history of combined systolic/diastolic CHF, Zenker's diverticulum, atrial flutter and paroxysmal atrial fibrillation who presents for consultation in the [facility].  The patient was initially diagnosed with atrial fibrillation 10/2018 after presenting with symptoms of increased SOB, weight gain, and lower extremity edema. Patient was started on Eliquis for a CHADS2VASC score of 1 and underwent DCCV on 11/04/19. His Eliquis was later discontinued with a low CHADS2VASC score. He was seen in follow up 09/29/19 and a repeat echo was ordered which showed decreased EF 45-50%. His CHADS2VASC score is now 2 and will soon be 3 when he turns 27 in March. He denies any awareness of his arrhythmia. He does have a home BP machine which will show if he is out of rhythm. He has episodes about every 3 weeks which will last about 24 hours. His heart rate is controlled even when he is out of rhythm.   On follow up today, patient reports he has done well from a cardiac standpoint. He has not had any interim episodes of afib. He stopped Eliquis on his own not long after starting due to bruising on his arms. He also reports increased fatigue over the last 3 weeks.   Today, he denies symptoms of palpitations, chest pain, shortness of breath, orthopnea, PND, lower extremity edema, dizziness, presyncope, syncope, snoring, daytime somnolence, bleeding, or neurologic sequela. The patient is tolerating medications without difficulties and is otherwise without complaint today.    Atrial Fibrillation Risk Factors:  he does not have symptoms or diagnosis of sleep apnea. he does not have a history of rheumatic fever. he does have a history of alcohol use. The patient does not have a history of early familial atrial  fibrillation or other arrhythmias.  he has a BMI of Body mass index is 26.68 kg/m.Marland Kitchen Filed Weights   03/02/20 1431  Weight: 91.7 kg    Family History  Family history unknown: Yes     Atrial Fibrillation Management history:  Previous antiarrhythmic drugs: none Previous cardioversions: 11/04/19 Previous ablations: none CHADS2VASC score: 2 Anticoagulation history: Eliquis   Past Medical History:  Diagnosis Date  . Atrial fibrillation with rapid ventricular response ([place]) 10/25/2018   Successful  DCCV shock x1 200 J 11/04/2018  . Chronic back pain    buldging disc  . Depression   . GERD (gastroesophageal reflux disease)   . GOUT    takes Allopurinol daily and Colchicine as needed  . History of bronchitis    many yrs ago  . History of colonoscopy    benign  . History of shingles   . Joint pain   . Joint swelling   . Muscle spasm    takes Robaxin as needed  . Nocturia   . OSTEOARTHRITIS   . Peripheral edema    takes Lasix daily as needed  . Pneumonia 1969  . Seasonal allergies    takes Zyrtec and uses Flonase is needed  . TESTICULAR HYPOFUNCTION   . Zenker diverticula    Past Surgical History:  Procedure Laterality Date  . ANKLE SURGERY     4 times  . CAPSULOTOMY Right 08/20/2019   Procedure: SCAR CONTRACTURE AND CAPSULE RELEASE OF RIGHT BREAST TISSUE;  Surgeon: Wallace Going, DO;  Location: [place];  Service: Plastics;  Laterality: Right;  . CARDIOVERSION  N/A 11/04/2018   Procedure: CARDIOVERSION;  Surgeon: Acie Fredrickson Wonda Cheng, MD;  Location: [place];  Service: Cardiovascular;  Laterality: N/A;  . COLONOSCOPY    . ELBOW SURGERY Right    torn tendon  . INGUINAL HERNIA REPAIR     right  . KNEE SURGERY     3 on each knee  . SCAR REVISION Right 08/20/2019   Procedure: SCAR REVISION;  Surgeon: Wallace Going, DO;  Location: [place];  Service: Plastics;  Laterality: Right;  . SHOULDER ARTHROSCOPY Right 06/15/2015    Procedure: RIGHT SHOULDER ARTHROSCOPY, ACROMIOPLASTY, DEBRIDEMENT;  Surgeon: Melrose Nakayama, MD;  Location: [facility];  Service: Orthopedics;  Laterality: Right;  . shoulder arthrosopy Left   . TONSILLECTOMY AND ADENOIDECTOMY    . TUMOR EXCISION     sebaceous cyst removed from chest  . ZENKER'S DIVERTICULECTOMY ENDOSCOPIC N/A 06/14/2016   Procedure: Stanford Breed DIVERTICULECTOMY ENDOSCOPIC;  Surgeon: Izora Gala, MD;  Location: [facility];  Service: ENT;  Laterality: N/A;    Current Outpatient Medications  Medication Sig Dispense Refill  . allopurinol (ZYLOPRIM) 100 MG tablet Take 300 mg by mouth daily.    . ARTIFICIAL TEARS 0.2-0.2-1 % SOLN     . CALCIUM 600 1500 (600 Ca) MG TABS tablet Take 600 mg of elemental calcium by mouth 2 (two) times daily.     . Calcium Carbonate-Vitamin D3 600-400 MG-UNIT TABS     . diltiazem (CARDIZEM CD) 120 MG 24 hr capsule Take 1 capsule (120 mg total) by mouth daily as needed (IF YOUR HEART RATE GOES OVER 100). 90 capsule 1  . famotidine (PEPCID) 20 MG tablet Take 20 mg by mouth 2 (two) times a day.    . furosemide (LASIX) 40 MG tablet Take 1 tablet (40 mg total) by mouth daily. 1 tab twice daily for 5 days then resume 1 daily ./cy 35 tablet 6  . ibuprofen (ADVIL) 600 MG tablet     . indomethacin (INDOCIN) 50 MG capsule     . ipratropium (ATROVENT) 0.03 % nasal spray Place 1 spray into the nose daily as needed for rhinitis.     . magnesium oxide (MAG-OX) 400 MG tablet Take 1 tablet by mouth 2 (two) times daily.    . metoprolol tartrate (LOPRESSOR) 25 MG tablet Take 1 tablet (25 mg total) by mouth 2 (two) times daily. Hold SYSTOLIC BP <034, HR <74 30 tablet 0  . morphine (MS CONTIN) 15 MG 12 hr tablet Take 15 mg by mouth every 12 (twelve) hours.    . Multiple Vitamin (MULTIVITAMIN) tablet Take 1 tablet by mouth daily.    Marland Kitchen oxyCODONE (OXY IR/ROXICODONE) 5 MG immediate release tablet Take 1 tablet (5 mg total) by mouth every 4 (four) hours as needed for severe pain. In  addition to current chronic pain medication 30 tablet 0  . potassium chloride SA (KLOR-CON) 20 MEQ tablet Take 20 mEq by mouth daily.    . sildenafil (VIAGRA) 100 MG tablet Take 100 mg by mouth daily as needed for erectile dysfunction.    . tamsulosin (FLOMAX) 0.4 MG CAPS capsule Take 0.4 mg by mouth.     . vitamin B-12 (CYANOCOBALAMIN) 1000 MCG tablet      No current facility-administered medications for this encounter.    No Known Allergies  Social History   Socioeconomic History  . Marital status: Married    Spouse name: Not on file  . Number of children: Not on file  . Years of education:  Not on file  . Highest education level: Not on file  Occupational History  . Not on file  Tobacco Use  . Smoking status: Never Smoker  . Smokeless tobacco: Never Used  Vaping Use  . Vaping Use: Never used  Substance and Sexual Activity  . Alcohol use: Yes    Alcohol/week: 2.0 - 4.0 standard drinks    Types: 1 - 2 Glasses of wine, 1 - 2 Cans of beer per week    Comment: daily  . Drug use: No  . Sexual activity: Not on file  Other Topics Concern  . Not on file  Social History Narrative  . Not on file   Social Determinants of Health   Financial Resource Strain:   . Difficulty of Paying Living Expenses: Not on file  Food Insecurity:   . Worried About Charity fundraiser in the Last Year: Not on file  . Ran Out of Food in the Last Year: Not on file  Transportation Needs:   . Lack of Transportation (Medical): Not on file  . Lack of Transportation (Non-Medical): Not on file  Physical Activity:   . Days of Exercise per Week: Not on file  . Minutes of Exercise per Session: Not on file  Stress:   . Feeling of Stress : Not on file  Social Connections:   . Frequency of Communication with Friends and Family: Not on file  . Frequency of Social Gatherings with Friends and Family: Not on file  . Attends Religious Services: Not on file  . Active Member of Clubs or Organizations: Not on  file  . Attends Archivist Meetings: Not on file  . Marital Status: Not on file  Intimate Partner Violence:   . Fear of Current or Ex-Partner: Not on file  . Emotionally Abused: Not on file  . Physically Abused: Not on file  . Sexually Abused: Not on file     ROS- All systems are reviewed and negative except as per the HPI above.  Physical Exam: Vitals:   03/02/20 1431  BP: 138/68  Pulse: 68  Weight: 91.7 kg  Height: 6\' 1"  (1.854 m)    GEN- The patient is well appearing elderly male, alert and oriented x 3 today.   HEENT-head normocephalic, atraumatic, sclera clear, conjunctiva pink, hearing intact, trachea midline. Lungs- Clear to ausculation bilaterally, normal work of breathing Heart- Regular rate and rhythm, no murmurs, rubs or gallops  GI- soft, NT, ND, + BS Extremities- no clubbing, cyanosis, trace bilateral  edema MS- no significant deformity or atrophy Skin- no rash or lesion Psych- euthymic mood, full affect Neuro- strength and sensation are intact   Wt Readings from Last 3 Encounters:  03/02/20 91.7 kg  01/28/20 90.3 kg  01/20/20 88.9 kg    EKG today demonstrates SR HR 68, inc RBBB, PR 188, QRS 98, QTc 421  Echo 10/01/19 demonstrated  1. Left ventricular ejection fraction, by estimation, is 45 to 50%. The  left ventricle has mildly decreased function. The left ventricle  demonstrates global hypokinesis. Left ventricular diastolic function could  not be evaluated.  2. Right ventricular systolic function is moderately reduced. The right  ventricular size is severely enlarged. There is moderately elevated  pulmonary artery systolic pressure.  3. Left atrial size was severely dilated.  4. Right atrial size was severely dilated.  5. The mitral valve is normal in structure. Mild mitral valve  regurgitation.  6. Tricuspid valve regurgitation is mild to moderate.  7.  The aortic valve is tricuspid. Aortic valve regurgitation is trivial.  Mild  aortic valve sclerosis is present, with no evidence of aortic valve  stenosis.  8. The inferior vena cava is dilated in size with <50% respiratory  variability, suggesting right atrial pressure of 15 mmHg.   Comparison(s): The left ventricular function is worsened. The right  ventricular systolic function is worse.   Epic records are reviewed at length today  CHA2DS2-VASc Score = 2  The patient's score is based upon: CHF History: 1 HTN History: 0 Diabetes History: 0 Stroke History: 0 Vascular Disease History: 0 Age Score: 1 Gender Score: 0      ASSESSMENT AND PLAN: 1. Paroxysmal Atrial Fibrillation/atrial flutter The patient's CHA2DS2-VASc score is 2, indicating a 2.2% annual risk of stroke.   Patient appears to be maintaining SR. Resume Eliquis 5 mg BID. Stress importance of compliance with anticoagulation to prevent stroke.  Continue diltiazem 120 mg daily Continue Lopressor 25 mg BID Lifestyle modification was discussed including alcohol reduction.  2. Secondary Hypercoagulable State (ICD10:  D68.69) The patient is at significant risk for stroke/thromboembolism based upon his CHA2DS2-VASc Score of 2.  Continue Apixaban (Eliquis).   3. Chronic combined systolic/diastolic CHF EF 24-09% No signs or symptoms of fluid overload.  4. Fatigue  Unclear etiology, does not appear related to afib.  Check bmet/cbc/TSH today.    Follow up with AF clinic in 6-8 weeks. Kerin Ransom per recall.    [facility] [address] [place], [place] [address] [phone] 03/02/2020 3:00 PM

## 2020-03-04 ENCOUNTER — Other Ambulatory Visit (HOSPITAL_COMMUNITY): Payer: Self-pay | Admitting: *Deleted

## 2020-03-04 MED ORDER — APIXABAN 5 MG PO TABS
5.0000 mg | ORAL_TABLET | Freq: Two times a day (BID) | ORAL | 6 refills | Status: DC
Start: 2020-03-04 — End: 2021-07-22

## 2020-03-15 ENCOUNTER — Encounter (HOSPITAL_COMMUNITY): Payer: Self-pay

## 2020-03-15 ENCOUNTER — Telehealth (HOSPITAL_COMMUNITY): Payer: Self-pay | Admitting: *Deleted

## 2020-03-15 ENCOUNTER — Emergency Department (HOSPITAL_COMMUNITY): Payer: No Typology Code available for payment source

## 2020-03-15 ENCOUNTER — Other Ambulatory Visit: Payer: Self-pay

## 2020-03-15 ENCOUNTER — Emergency Department (HOSPITAL_COMMUNITY)
Admission: EM | Admit: 2020-03-15 | Discharge: 2020-03-15 | Disposition: A | Discharge status: Home / Self Care | Payer: No Typology Code available for payment source | Attending: Emergency Medicine | Admitting: Emergency Medicine

## 2020-03-15 DIAGNOSIS — S29001A Unspecified injury of muscle and tendon of front wall of thorax, initial encounter: Secondary | ICD-10-CM | POA: Diagnosis not present

## 2020-03-15 DIAGNOSIS — Z7901 Long term (current) use of anticoagulants: Secondary | ICD-10-CM | POA: Diagnosis not present

## 2020-03-15 DIAGNOSIS — J918 Pleural effusion in other conditions classified elsewhere: Secondary | ICD-10-CM | POA: Insufficient documentation

## 2020-03-15 DIAGNOSIS — R0781 Pleurodynia: Secondary | ICD-10-CM

## 2020-03-15 DIAGNOSIS — S0003XA Contusion of scalp, initial encounter: Secondary | ICD-10-CM | POA: Diagnosis not present

## 2020-03-15 DIAGNOSIS — W19XXXA Unspecified fall, initial encounter: Secondary | ICD-10-CM

## 2020-03-15 DIAGNOSIS — S00412A Abrasion of left ear, initial encounter: Secondary | ICD-10-CM | POA: Diagnosis not present

## 2020-03-15 DIAGNOSIS — S0990XA Unspecified injury of head, initial encounter: Secondary | ICD-10-CM | POA: Diagnosis present

## 2020-03-15 DIAGNOSIS — Y92009 Unspecified place in unspecified non-institutional (private) residence as the place of occurrence of the external cause: Secondary | ICD-10-CM | POA: Insufficient documentation

## 2020-03-15 DIAGNOSIS — W010XXA Fall on same level from slipping, tripping and stumbling without subsequent striking against object, initial encounter: Secondary | ICD-10-CM | POA: Diagnosis not present

## 2020-03-15 DIAGNOSIS — S5012XA Contusion of left forearm, initial encounter: Secondary | ICD-10-CM | POA: Insufficient documentation

## 2020-03-15 DIAGNOSIS — I5023 Acute on chronic systolic (congestive) heart failure: Secondary | ICD-10-CM | POA: Insufficient documentation

## 2020-03-15 DIAGNOSIS — J90 Pleural effusion, not elsewhere classified: Secondary | ICD-10-CM

## 2020-03-15 NOTE — ED Triage Notes (Signed)
Patient states she tripped and fell into the wall. Patient c/o headache, and blurred vision. Patient states he takes Eliquis.  Patient also c/o left rib pain.

## 2020-03-15 NOTE — Discharge Instructions (Signed)
Chest x-ray revealed no left rib fracture. It showed a small right pleural effusion (fluid). Call your primary care doctor for more discussion of this.  CT of the head was normal.  Take [phone] mg acetaminophen every 6-8 hours for rib pain. You may purchase over the counter lidocaine 4% patches and place over area of pain.   Return for worsening symptoms

## 2020-03-15 NOTE — Telephone Encounter (Signed)
Patient called in this morning stating he fell on Friday evening and hit his head and ear. He endorses continued headaches. As patient is on Eliquis for anticoagulation pt is instructed to proceed to the ER to be further evaluated. Pt verbalized understanding.

## 2020-03-15 NOTE — ED Provider Notes (Signed)
[facility] DEPT Provider Note   CSN: [id number] Arrival date & time: 03/15/20  1409     History Chief Complaint  Patient presents with  . Fall  . Head Injury  . Rib Injury    [name] is a 74 y.o. male with past medical history of atrial fibrillation s/p DCCV on Eliquis, Cardizem, Lopressor, chronic combined CHF presents to the ED for evaluation of fall that occurred 5 days ago.  States he was walking in his home when his foot got caught and tripped.  He fell onto his left side.  States his left side of his ear and head hit the corner of 2 walls and then he slid down to the ground.  Reports small abrasion in the left ear and swelling on the left side of his head, bruising in the left forearm and pain in the left ribs.  Denies syncope after the fall.  Denies any other physical injuries.  Has had intermittent left-sided headaches that are mild since and continued left rib pain is worse with palpation, taking deep breaths.  States his vision was blurred in both of his eyes after hitting his head initially but states this has resolved.  States he brought to magazines to the ED and let them without a problem.  Denies prodromal of chest pain, lightheadedness or shortness of breath.  No associated nausea, vomiting.  Has been feeling well over the last few days.  Is concerned about head injury since he is on Eliquis.  No interventions.   HPI     Past Medical History:  Diagnosis Date  . Atrial fibrillation with rapid ventricular response ([place]) 10/25/2018   Successful  DCCV shock x1 200 J 11/04/2018  . Chronic back pain    buldging disc  . Depression   . GERD (gastroesophageal reflux disease)   . GOUT    takes Allopurinol daily and Colchicine as needed  . History of bronchitis    many yrs ago  . History of colonoscopy    benign  . History of shingles   . Joint pain   . Joint swelling   . Muscle spasm    takes Robaxin as needed  . Nocturia   .  OSTEOARTHRITIS   . Peripheral edema    takes Lasix daily as needed  . Pneumonia 1969  . Seasonal allergies    takes Zyrtec and uses Flonase is needed  . TESTICULAR HYPOFUNCTION   . Zenker diverticula     Patient Active Problem List   Diagnosis Date Noted  . Secondary hypercoagulable state ([place]) 01/28/2020  . Brow ptosis, bilateral 09/12/2019  . Lower extremity edema 09/09/2019  . Breast pain 06/17/2019  . Acquired absence of nipple, right 06/17/2019  . Anticoagulated 10/25/2018  . Acute on chronic combined systolic and diastolic CHF (congestive heart failure) ([place]) 10/25/2018  . Chronic pain 10/12/2018  . Macrocytic anemia 10/12/2018  . Atrial fibrillation ([place]) 10/12/2018  . Atrial fibrillation with RVR ([place]) 10/11/2018  . Oropharyngeal dysphagia 08/14/2017  . Sore throat 07/21/2016  . Vasomotor rhinitis 06/23/2016  . Zenker's diverticulum 06/14/2016  . Chronic venous insufficiency 06/28/2015  . Vitamin B12 deficiency 02/07/2013  . History of colonic polyps 09/15/2010  . Weakness generalized 06/09/2010  . TESTICULAR HYPOFUNCTION 01/04/2010  . PULMONARY NODULE, LEFT LOWER LOBE 01/04/2010  . Allergic rhinitis 06/17/2009  . WEIGHT LOSS 03/17/2009  . GOUT 05/04/2008  . Osteoarthritis 05/04/2008  . LOW BACK PAIN 05/04/2008    Past Surgical  History:  Procedure Laterality Date  . ANKLE SURGERY     4 times  . CAPSULOTOMY Right 08/20/2019   Procedure: SCAR CONTRACTURE AND CAPSULE RELEASE OF RIGHT BREAST TISSUE;  Surgeon: Wallace Going, DO;  Location: [facility];  Service: Plastics;  Laterality: Right;  . CARDIOVERSION N/A 11/04/2018   Procedure: CARDIOVERSION;  Surgeon: Acie Fredrickson Wonda Cheng, MD;  Location: [facility];  Service: Cardiovascular;  Laterality: N/A;  . COLONOSCOPY    . ELBOW SURGERY Right    torn tendon  . INGUINAL HERNIA REPAIR     right  . KNEE SURGERY     3 on each knee  . SCAR REVISION Right 08/20/2019   Procedure: SCAR REVISION;   Surgeon: Wallace Going, DO;  Location: [facility];  Service: Plastics;  Laterality: Right;  . SHOULDER ARTHROSCOPY Right 06/15/2015   Procedure: RIGHT SHOULDER ARTHROSCOPY, ACROMIOPLASTY, DEBRIDEMENT;  Surgeon: Melrose Nakayama, MD;  Location: [facility];  Service: Orthopedics;  Laterality: Right;  . shoulder arthrosopy Left   . TONSILLECTOMY AND ADENOIDECTOMY    . TUMOR EXCISION     sebaceous cyst removed from chest  . ZENKER'S DIVERTICULECTOMY ENDOSCOPIC N/A 06/14/2016   Procedure: Stanford Breed DIVERTICULECTOMY ENDOSCOPIC;  Surgeon: Izora Gala, MD;  Location: [facility] OR;  Service: ENT;  Laterality: N/A;       Family History  Family history unknown: Yes    Social History   Tobacco Use  . Smoking status: Never Smoker  . Smokeless tobacco: Never Used  Vaping Use  . Vaping Use: Never used  Substance Use Topics  . Alcohol use: Yes    Alcohol/week: 2.0 - 4.0 standard drinks    Types: 1 - 2 Glasses of wine, 1 - 2 Cans of beer per week    Comment: daily  . Drug use: No    Home Medications Prior to Admission medications   Medication Sig Start Date End Date Taking? Authorizing Provider  allopurinol (ZYLOPRIM) 100 MG tablet Take 300 mg by mouth daily.    [provider]  apixaban (ELIQUIS) 5 MG TABS tablet Take 1 tablet (5 mg total) by mouth 2 (two) times daily. 03/04/20   Fenton, Clint R, PA  ARTIFICIAL TEARS 0.2-0.2-1 % SOLN  12/31/18   [provider]  CALCIUM 600 1500 (600 Ca) MG TABS tablet Take 600 mg of elemental calcium by mouth 2 (two) times daily.  02/16/16   [provider]  Calcium Carbonate-Vitamin D3 600-400 MG-UNIT TABS  11/11/19   [provider]  diltiazem (CARDIZEM CD) 120 MG 24 hr capsule Take 1 capsule (120 mg total) by mouth daily as needed (IF YOUR HEART RATE GOES OVER 100). 11/28/18   Deberah Pelton, NP  famotidine (PEPCID) 20 MG tablet Take 20 mg by mouth 2 (two) times a day.    [provider]  furosemide  (LASIX) 40 MG tablet Take 1 tablet (40 mg total) by mouth daily. 1 tab twice daily for 5 days then resume 1 daily ./cy 09/09/19   Lorretta Harp, MD  ibuprofen (ADVIL) 600 MG tablet  12/31/18   [provider]  indomethacin (INDOCIN) 50 MG capsule  12/31/18   [provider]  ipratropium (ATROVENT) 0.03 % nasal spray Place 1 spray into the nose daily as needed for rhinitis.  06/23/16   [provider]  magnesium oxide (MAG-OX) 400 MG tablet Take 1 tablet by mouth 2 (two) times daily. 07/21/19   [provider]  metoprolol tartrate (  LOPRESSOR) 25 MG tablet Take 1 tablet (25 mg total) by mouth 2 (two) times daily. Hold SYSTOLIC BP <841, HR <66 0/63/01   Deberah Pelton, NP  morphine (MS CONTIN) 15 MG 12 hr tablet Take 15 mg by mouth every 12 (twelve) hours.    [provider]  Multiple Vitamin (MULTIVITAMIN) tablet Take 1 tablet by mouth daily.    [provider]  oxyCODONE (OXY IR/ROXICODONE) 5 MG immediate release tablet Take 1 tablet (5 mg total) by mouth every 4 (four) hours as needed for severe pain. In addition to current chronic pain medication 06/15/15   Loni Dolly, PA-C  potassium chloride SA (KLOR-CON) 20 MEQ tablet Take 20 mEq by mouth daily. 07/15/19   [provider]  sildenafil (VIAGRA) 100 MG tablet Take 100 mg by mouth daily as needed for erectile dysfunction.    [provider]  tamsulosin (FLOMAX) 0.4 MG CAPS capsule Take 0.4 mg by mouth.     [provider]  vitamin B-12 (CYANOCOBALAMIN) 1000 MCG tablet  12/31/18   [provider]    Allergies    Patient has no known allergies.  Review of Systems   Review of Systems  Cardiovascular: Positive for chest pain (left rib).  Skin: Positive for color change and wound.  Neurological: Positive for headaches.  Hematological: Bruises/bleeds easily.  All other systems reviewed and are negative.   Physical Exam Updated Vital Signs BP (!) 154/80    Pulse 68   Temp 97.9 F (36.6 C) (Oral)   Resp 20   Ht 6\' 1"  (1.854 m)   Wt 93 kg   SpO2 100%   BMI 27.05 kg/m   Physical Exam Vitals and nursing note reviewed.  Constitutional:      General: He is not in acute distress.    Appearance: He is well-developed and well-nourished.     Comments: NAD.  HENT:     Head:     Comments: Ecchymosis and contusion left temporal scalp, mildly tender. No wounds.     Right Ear: Tympanic membrane and external ear normal.     Left Ear: Tympanic membrane and external ear normal.     Nose: Nose normal.     Mouth/Throat:     Comments: Lips and dentition without signs of trauma  Eyes:     General: No scleral icterus.    Extraocular Movements: Extraocular movements intact and EOM normal.     Conjunctiva/sclera: Conjunctivae normal.     Pupils: Pupils are equal, round, and reactive to light.  Neck:     Comments: No midline or paraspinal c-spine tenderness  Cardiovascular:     Rate and Rhythm: Normal rate and regular rhythm.     Pulses: Intact distal pulses.     Heart sounds: Normal heart sounds.  Pulmonary:     Effort: Pulmonary effort is normal.     Breath sounds: No wheezing.     Comments: Left low anterior/lateral rib tenderness, no overlaying contusion or wounds of the skin  Chest:     Chest wall: Tenderness present.  Abdominal:     Palpations: Abdomen is soft.     Tenderness: There is no abdominal tenderness.     Comments: No LUE or left CVA tenderness, no abdominal tenderness or contusions   Musculoskeletal:        General: No deformity. Normal range of motion.     Cervical back: Normal range of motion and neck supple.  Skin:    General: Skin  is warm and dry.     Capillary Refill: Capillary refill takes less than 2 seconds.     Comments: Large ecchymosis left forearm, non tender. Small hemostatic non tender abrasion left auricle   Neurological:     Mental Status: He is alert and oriented to person, place, and time.     Comments:    Awake, alert. Speech clear. Sensation to light touch intact in face, upper/lower extremities. Strength equal and symmetric bilaterally. No arm or leg drop/drift. Normal FTN. CN 2-12 intact.   Psychiatric:        Mood and Affect: Mood and affect normal.        Behavior: Behavior normal.        Thought Content: Thought content normal.        Judgment: Judgment normal.     ED Results / Procedures / Treatments   Labs (all labs ordered are listed, but only abnormal results are displayed) Labs Reviewed - No data to display  EKG None  Radiology DG Chest 2 View  Result Date: 03/15/2020 CLINICAL DATA:  Left chest pain after fall last week. EXAM: CHEST - 2 VIEW COMPARISON:  October 11, 2018. FINDINGS: The heart size and mediastinal contours are within normal limits. No pneumothorax is noted. Small right pleural effusion is noted. No consolidative process is noted. The visualized skeletal structures are unremarkable. IMPRESSION: Small right pleural effusion. Electronically Signed   By: Marijo Conception M.D.   On: 03/15/2020 15:33   CT Head Wo Contrast  Result Date: 03/15/2020 CLINICAL DATA:  Fall, on blood thinners. EXAM: CT HEAD WITHOUT CONTRAST TECHNIQUE: Contiguous axial images were obtained from the base of the skull through the vertex without intravenous contrast. COMPARISON:  None. FINDINGS: Brain: There is atrophy and chronic small vessel disease changes. No acute intracranial abnormality. Specifically, no hemorrhage, hydrocephalus, mass lesion, acute infarction, or significant intracranial injury. Vascular: No hyperdense vessel or unexpected calcification. Skull: No acute calvarial abnormality. Sinuses/Orbits: Visualized paranasal sinuses and mastoids clear. Orbital soft tissues unremarkable. Other: None IMPRESSION: Atrophy, chronic microvascular disease. No acute intracranial abnormality. Electronically Signed   By: Rolm Baptise M.D.   On: 03/15/2020 19:02    Procedures Procedures  (including critical care time)  Medications Ordered in ED Medications - No data to display  ED Course  I have reviewed the triage vital signs and the nursing notes.  Pertinent labs & imaging results that were available during my care of the patient were reviewed by me and considered in my medical decision making (see chart for details).    MDM Rules/Calculators/A&P                          74 year old male on Eliquis for atrial fibrillation presents to the ED for evaluation of injury sustained after mechanical fall that occurred 5 days ago.  Sustained left scalp trauma and left rib trauma.  EMR triage and nursing notes reviewed  Imaging ordered as above. No need for emergent lab work today given mechanical fall.   Imaging personally visualized and interpreted, no acute findings.  Discussed with patient results and recommended symptomatic management.  X-ray of the chest revealed incidental right pleural effusion.  Has had sinus issues for the last week with some streaks of bright red blood when he blows his nose. Patient denies exertional chest pain, shortness of breath, cough, hemoptysis, recent illness.  Appropriate for discharge with symptomatic management.  Recommended PCP follow-up for further discussion  of incidental right pleural effusion noted today.  Discussed with patient that it is possible that x-rays today missed a small rib fracture.  Stressed importance of full/deep breaths, pain control to prevent pneumonia.  Return precautions discussed. Patient in agreement with plan of care Final Clinical Impression(s) / ED Diagnoses Final diagnoses:  Fall, initial encounter  Rib pain on left side  Contusion of scalp, initial encounter  Pleural effusion on right    Rx / DC Orders ED Discharge Orders    None       Arlean Hopping 03/15/20 2046    Sherwood Gambler, MD 03/16/20 0028

## 2020-04-13 ENCOUNTER — Other Ambulatory Visit: Payer: Self-pay

## 2020-04-13 ENCOUNTER — Encounter (HOSPITAL_COMMUNITY): Payer: Self-pay | Admitting: Physician Assistant

## 2020-04-13 ENCOUNTER — Ambulatory Visit (HOSPITAL_COMMUNITY)
Admission: RE | Admit: 2020-04-13 | Discharge: 2020-04-13 | Disposition: A | Discharge status: Home / Self Care | Payer: Medicare Other | Source: Ambulatory Visit | Attending: Physician Assistant | Admitting: Physician Assistant

## 2020-04-13 VITALS — BP 132/64 | HR 83 | Ht 73.0 in | Wt 204.2 lb

## 2020-04-13 DIAGNOSIS — I082 Rheumatic disorders of both aortic and tricuspid valves: Secondary | ICD-10-CM | POA: Diagnosis not present

## 2020-04-13 DIAGNOSIS — I5042 Chronic combined systolic (congestive) and diastolic (congestive) heart failure: Secondary | ICD-10-CM | POA: Diagnosis not present

## 2020-04-13 DIAGNOSIS — D6869 Other thrombophilia: Secondary | ICD-10-CM | POA: Diagnosis not present

## 2020-04-13 DIAGNOSIS — Z7901 Long term (current) use of anticoagulants: Secondary | ICD-10-CM | POA: Insufficient documentation

## 2020-04-13 DIAGNOSIS — I4892 Unspecified atrial flutter: Secondary | ICD-10-CM | POA: Diagnosis not present

## 2020-04-13 DIAGNOSIS — I480 Paroxysmal atrial fibrillation: Secondary | ICD-10-CM | POA: Insufficient documentation

## 2020-04-13 NOTE — Progress Notes (Signed)
Primary Care Physician: [facility] Primary Cardiologist: Dr Allyson SabalBerry Primary Electrophysiologist: none Referring Physician: Dr Eulas PostBerry   [name] is a 75 y.o. male with a history of combined systolic/diastolic CHF, Zenker's diverticulum, atrial flutter and paroxysmal atrial fibrillation who presents for consultation in [facility].  The patient was initially diagnosed with atrial fibrillation 10/2018 after presenting with symptoms of increased SOB, weight gain, and lower extremity edema. Patient was started on Eliquis for a CHADS2VASC score of 1 and underwent DCCV on 11/04/19. His Eliquis was later discontinued with a low CHADS2VASC score. He was seen in follow up 09/29/19 and a repeat echo was ordered which showed decreased EF 45-50%. His CHADS2VASC score is now 2 and will soon be 3 when he turns 75 in March. He denies any awareness of his arrhythmia. He does have a home BP machine which will show if he is out of rhythm. He has episodes about every 3 weeks which will last about 24 hours. His heart rate is controlled even when he is out of rhythm.   On follow up today, patient reports he has done well from a cardiac standpoint. He denies any heart racing or palpitations. His BP machine has shown only regular rates. He denies any significant bleeding on anticoagulation. He did have a mechanical fall 03/15/20 but was negative for any intracranial bleeding.  Today, he denies symptoms of palpitations, chest pain, shortness of breath, orthopnea, PND, lower extremity edema, dizziness, presyncope, syncope, snoring, daytime somnolence, bleeding, or neurologic sequela. The patient is tolerating medications without difficulties and is otherwise without complaint today.    Atrial Fibrillation Risk Factors:  he does not have symptoms or diagnosis of sleep apnea. he does not have a history of rheumatic fever. he does have a history of alcohol use. The patient  does not have a history of early familial atrial fibrillation or other arrhythmias.  he has a BMI of Body mass index is 26.94 kg/m.Marland Kitchen. Filed Weights   04/13/20 1414  Weight: 92.6 kg    Family History  Family history unknown: Yes     Atrial Fibrillation Management history:  Previous antiarrhythmic drugs: none Previous cardioversions: 11/04/19 Previous ablations: none CHADS2VASC score: 2 Anticoagulation history: Eliquis   Past Medical History:  Diagnosis Date  . Atrial fibrillation with rapid ventricular response ([facility]) 10/25/2018   Successful  DCCV shock x1 200 J 11/04/2018  . Chronic back pain    buldging disc  . Depression   . GERD (gastroesophageal reflux disease)   . GOUT    takes Allopurinol daily and Colchicine as needed  . History of bronchitis    many yrs ago  . History of colonoscopy    benign  . History of shingles   . Joint pain   . Joint swelling   . Muscle spasm    takes Robaxin as needed  . Nocturia   . OSTEOARTHRITIS   . Peripheral edema    takes Lasix daily as needed  . Pneumonia 1969  . Seasonal allergies    takes Zyrtec and uses Flonase is needed  . TESTICULAR HYPOFUNCTION   . Zenker diverticula    Past Surgical History:  Procedure Laterality Date  . ANKLE SURGERY     4 times  . CAPSULOTOMY Right 08/20/2019   Procedure: SCAR CONTRACTURE AND CAPSULE RELEASE OF RIGHT BREAST TISSUE;  Surgeon: Peggye Formillingham, Claire S, DO;  Location: [facility];  Service: Plastics;  Laterality: Right;  . CARDIOVERSION  N/A 11/04/2018   Procedure: CARDIOVERSION;  Surgeon: Acie Fredrickson Wonda Cheng, MD;  Location: [place];  Service: Cardiovascular;  Laterality: N/A;  . COLONOSCOPY    . ELBOW SURGERY Right    torn tendon  . INGUINAL HERNIA REPAIR     right  . KNEE SURGERY     3 on each knee  . SCAR REVISION Right 08/20/2019   Procedure: SCAR REVISION;  Surgeon: Wallace Going, DO;  Location: [place];  Service: Plastics;  Laterality:  Right;  . SHOULDER ARTHROSCOPY Right 06/15/2015   Procedure: RIGHT SHOULDER ARTHROSCOPY, ACROMIOPLASTY, DEBRIDEMENT;  Surgeon: Melrose Nakayama, MD;  Location: [place];  Service: Orthopedics;  Laterality: Right;  . shoulder arthrosopy Left   . TONSILLECTOMY AND ADENOIDECTOMY    . TUMOR EXCISION     sebaceous cyst removed from chest  . ZENKER'S DIVERTICULECTOMY ENDOSCOPIC N/A 06/14/2016   Procedure: Stanford Breed DIVERTICULECTOMY ENDOSCOPIC;  Surgeon: Izora Gala, MD;  Location: [place];  Service: ENT;  Laterality: N/A;    Current Outpatient Medications  Medication Sig Dispense Refill  . allopurinol (ZYLOPRIM) 100 MG tablet Take 300 mg by mouth daily.    Marland Kitchen apixaban (ELIQUIS) 5 MG TABS tablet Take 1 tablet (5 mg total) by mouth 2 (two) times daily. 60 tablet 6  . ARTIFICIAL TEARS 0.2-0.2-1 % SOLN     . CALCIUM 600 1500 (600 Ca) MG TABS tablet Take 600 mg of elemental calcium by mouth 2 (two) times daily.     . Calcium Carbonate-Vitamin D3 600-400 MG-UNIT TABS     . diltiazem (CARDIZEM CD) 120 MG 24 hr capsule Take 1 capsule (120 mg total) by mouth daily as needed (IF YOUR HEART RATE GOES OVER 100). 90 capsule 1  . famotidine (PEPCID) 20 MG tablet Take 20 mg by mouth 2 (two) times a day.    . furosemide (LASIX) 40 MG tablet Take 1 tablet (40 mg total) by mouth daily. 1 tab twice daily for 5 days then resume 1 daily ./cy 35 tablet 6  . ibuprofen (ADVIL) 600 MG tablet     . indomethacin (INDOCIN) 50 MG capsule     . ipratropium (ATROVENT) 0.03 % nasal spray Place 1 spray into the nose daily as needed for rhinitis.     . magnesium oxide (MAG-OX) 400 MG tablet Take 1 tablet by mouth 2 (two) times daily.    . metoprolol tartrate (LOPRESSOR) 25 MG tablet Take 1 tablet (25 mg total) by mouth 2 (two) times daily. Hold SYSTOLIC BP <546, HR <56 30 tablet 0  . morphine (MS CONTIN) 15 MG 12 hr tablet Take 15 mg by mouth every 12 (twelve) hours.    . Multiple Vitamin (MULTIVITAMIN) tablet Take 1 tablet by mouth daily.     Marland Kitchen oxyCODONE (OXY IR/ROXICODONE) 5 MG immediate release tablet Take 1 tablet (5 mg total) by mouth every 4 (four) hours as needed for severe pain. In addition to current chronic pain medication 30 tablet 0  . potassium chloride SA (KLOR-CON) 20 MEQ tablet Take 20 mEq by mouth daily.    . sildenafil (VIAGRA) 100 MG tablet Take 100 mg by mouth daily as needed for erectile dysfunction.    . tamsulosin (FLOMAX) 0.4 MG CAPS capsule Take 0.4 mg by mouth.     . vitamin B-12 (CYANOCOBALAMIN) 1000 MCG tablet      No current facility-administered medications for this encounter.    No Known Allergies  Social History   Socioeconomic History  . Marital  status: Married    Spouse name: Not on file  . Number of children: Not on file  . Years of education: Not on file  . Highest education level: Not on file  Occupational History  . Not on file  Tobacco Use  . Smoking status: Never Smoker  . Smokeless tobacco: Never Used  Vaping Use  . Vaping Use: Never used  Substance and Sexual Activity  . Alcohol use: Yes    Alcohol/week: 2.0 - 4.0 standard drinks    Types: 1 - 2 Glasses of wine, 1 - 2 Cans of beer per week    Comment: daily  . Drug use: No  . Sexual activity: Not on file  Other Topics Concern  . Not on file  Social History Narrative  . Not on file   Social Determinants of Health   Financial Resource Strain: Not on file  Food Insecurity: Not on file  Transportation Needs: Not on file  Physical Activity: Not on file  Stress: Not on file  Social Connections: Not on file  Intimate Partner Violence: Not on file     ROS- All systems are reviewed and negative except as per the HPI above.  Physical Exam: Vitals:   04/13/20 1414  BP: 132/64  Pulse: 83  Weight: 92.6 kg  Height: 6\' 1"  (1.854 m)    GEN- The patient is well appearing, alert and oriented x 3 today.   HEENT-head normocephalic, atraumatic, sclera clear, conjunctiva pink, hearing intact, trachea midline. Lungs-  Clear to ausculation bilaterally, normal work of breathing Heart- Regular rate and rhythm, no murmurs, rubs or gallops  GI- soft, NT, ND, + BS Extremities- no clubbing, cyanosis, or edema MS- no significant deformity or atrophy Skin- no rash or lesion Psych- euthymic mood, full affect Neuro- strength and sensation are intact   Wt Readings from Last 3 Encounters:  04/13/20 92.6 kg  03/15/20 93 kg  03/02/20 91.7 kg    EKG today demonstrates  SR inc RBBB Vent. rate 83 BPM PR interval 184 ms QRS duration 96 ms QT/QTc 362/425 ms  Echo 10/01/19 demonstrated  1. Left ventricular ejection fraction, by estimation, is 45 to 50%. The  left ventricle has mildly decreased function. The left ventricle  demonstrates global hypokinesis. Left ventricular diastolic function could  not be evaluated.  2. Right ventricular systolic function is moderately reduced. The right  ventricular size is severely enlarged. There is moderately elevated  pulmonary artery systolic pressure.  3. Left atrial size was severely dilated.  4. Right atrial size was severely dilated.  5. The mitral valve is normal in structure. Mild mitral valve  regurgitation.  6. Tricuspid valve regurgitation is mild to moderate.  7. The aortic valve is tricuspid. Aortic valve regurgitation is trivial.  Mild aortic valve sclerosis is present, with no evidence of aortic valve  stenosis.  8. The inferior vena cava is dilated in size with <50% respiratory  variability, suggesting right atrial pressure of 15 mmHg.   Comparison(s): The left ventricular function is worsened. The right  ventricular systolic function is worse.   Epic records are reviewed at length today  CHA2DS2-VASc Score = 2  The patient's score is based upon: CHF History: Yes HTN History: No Diabetes History: No Stroke History: No Vascular Disease History: No Age Score: 1 Gender Score: 0      ASSESSMENT AND PLAN: 1. Paroxysmal Atrial  Fibrillation/atrial flutter The patient's CHA2DS2-VASc score is 2, indicating a 2.2% annual risk of stroke.  Patient appears to be maintaining SR. Continue Eliquis 5 mg BID Continue diltiazem 120 mg daily Continue Lopressor 25 mg BID Lifestyle modification was discussed including alcohol reduction.  2. Secondary Hypercoagulable State (ICD10:  D68.69) The patient is at significant risk for stroke/thromboembolism based upon his CHA2DS2-VASc Score of 2.  Continue Apixaban (Eliquis).   3. Chronic combined systolic/diastolic CHF EF 40-08% No signs or symptoms of fluid overload.   Follow up with Kerin Ransom per recall. AF clinic in 6 months.    [facility] [address] [place], [place] [address] [phone] 04/13/2020 2:31 PM

## 2020-08-06 IMAGING — DX PORTABLE CHEST - 1 VIEW
1 series · 1 of 1 positions shown · non-contrast
Comparison: June 15, 2016

CLINICAL DATA: Atrial fibrillation. Orthopnea. Shortness of breath.

EXAM:
PORTABLE CHEST 1 VIEW

[chest ap]
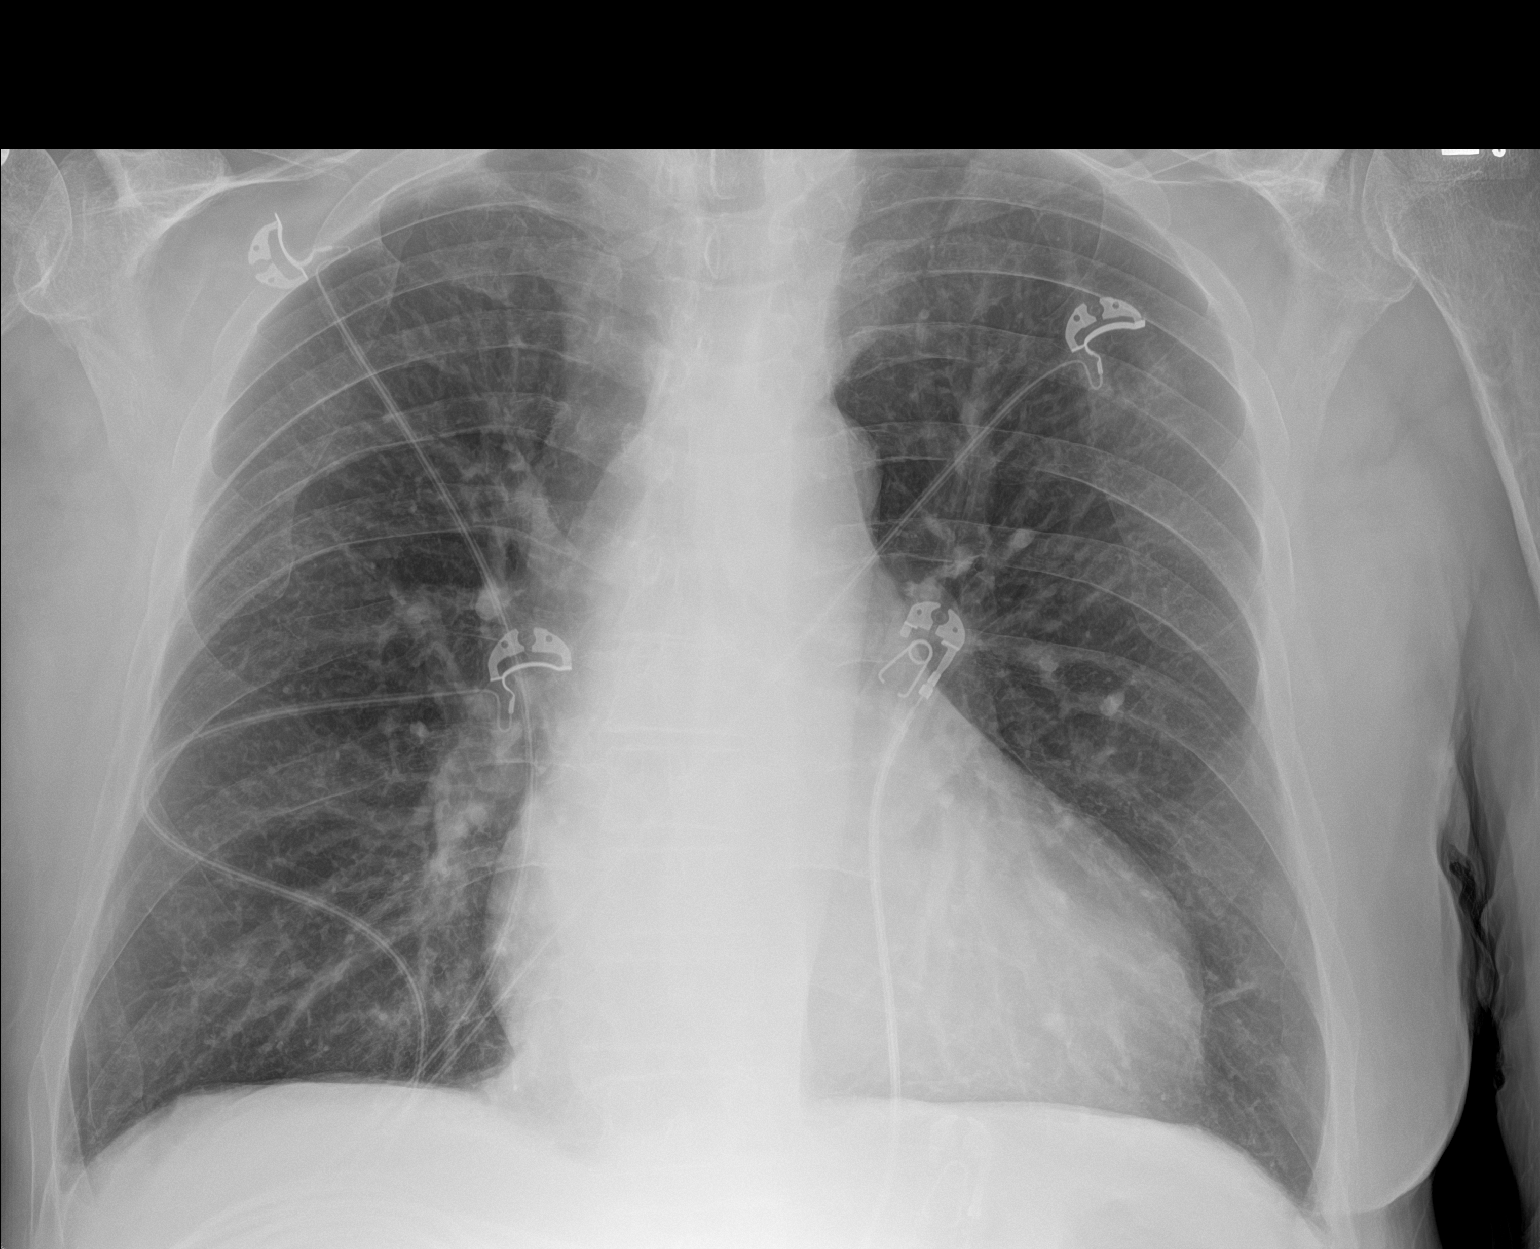

[1 of 1 positions shown; findings below may reference images not displayed]

FINDINGS: The heart size and mediastinal contours are within normal limits.
Both lungs are clear. The visualized skeletal structures are
unremarkable.
IMPRESSION: No active disease.

## 2020-08-18 NOTE — Progress Notes (Deleted)
Cardiology Office Note   Date:  08/18/2020   ID:  [name], DOB 04-15-1945, MRN [id number]  PCP:  [facility]  Cardiologist:  Dr. Gwenlyn Found  No chief complaint on file.    History of Present Illness: [name] is a 75 y.o. male who presents for ongoing assessment and management of chronic combined systolic and diastolic heart failure, atrial fibrillation paroxysmal not on oral anticoagulation, and chronic lower extremity edema.  He was last seen by Dr. Gwenlyn Found on 01/20/2020.  He noted that his last 2D echo in June 2021 revealed an EF of 45% to 50% with mild global hypokinesis.  The left atrium was severely dilated.  He was only taking diuretic as needed when he noted lower extremity edema.  For atrial fibrillation he was referred to the A. fib clinic for decision making of the appropriateness of oral anticoagulation.  He was seen in the A. fib clinic on 01/28/2020 at which time he was placed on Eliquis 5 mg twice daily after long discussion with the patient.  He was continued on diltiazem 120 mg daily and Lopressor 25 mg twice daily.  On follow-up in November 2021 he had no complaints but had stopped taking his Eliquis on his own due to bruising on his arms and fatigue.  He was restarted on Eliquis and the importance of compliance was discussed to prevent stroke.  He was continued on his other medications.    Past Medical History:  Diagnosis Date  . Atrial fibrillation with rapid ventricular response ([place]) 10/25/2018   Successful  DCCV shock x1 200 J 11/04/2018  . Chronic back pain    buldging disc  . Depression   . GERD (gastroesophageal reflux disease)   . GOUT    takes Allopurinol daily and Colchicine as needed  . History of bronchitis    many yrs ago  . History of colonoscopy    benign  . History of shingles   . Joint pain   . Joint swelling   . Muscle spasm    takes Robaxin as needed  . Nocturia   . OSTEOARTHRITIS   . Peripheral edema    takes Lasix daily  as needed  . Pneumonia 1969  . Seasonal allergies    takes Zyrtec and uses Flonase is needed  . TESTICULAR HYPOFUNCTION   . Zenker diverticula     Past Surgical History:  Procedure Laterality Date  . ANKLE SURGERY     4 times  . CAPSULOTOMY Right 08/20/2019   Procedure: SCAR CONTRACTURE AND CAPSULE RELEASE OF RIGHT BREAST TISSUE;  Surgeon: Wallace Going, DO;  Location: [place];  Service: Plastics;  Laterality: Right;  . CARDIOVERSION N/A 11/04/2018   Procedure: CARDIOVERSION;  Surgeon: Acie Fredrickson Wonda Cheng, MD;  Location: [facility];  Service: Cardiovascular;  Laterality: N/A;  . COLONOSCOPY    . ELBOW SURGERY Right    torn tendon  . INGUINAL HERNIA REPAIR     right  . KNEE SURGERY     3 on each knee  . SCAR REVISION Right 08/20/2019   Procedure: SCAR REVISION;  Surgeon: Wallace Going, DO;  Location: [place];  Service: Plastics;  Laterality: Right;  . SHOULDER ARTHROSCOPY Right 06/15/2015   Procedure: RIGHT SHOULDER ARTHROSCOPY, ACROMIOPLASTY, DEBRIDEMENT;  Surgeon: Melrose Nakayama, MD;  Location: [facility];  Service: Orthopedics;  Laterality: Right;  . shoulder arthrosopy Left   . TONSILLECTOMY AND ADENOIDECTOMY    . TUMOR EXCISION  sebaceous cyst removed from chest  . ZENKER'S DIVERTICULECTOMY ENDOSCOPIC N/A 06/14/2016   Procedure: Stanford Breed DIVERTICULECTOMY ENDOSCOPIC;  Surgeon: Izora Gala, MD;  Location: [facility];  Service: ENT;  Laterality: N/A;     Current Outpatient Medications  Medication Sig Dispense Refill  . allopurinol (ZYLOPRIM) 100 MG tablet Take 300 mg by mouth daily.    Marland Kitchen apixaban (ELIQUIS) 5 MG TABS tablet Take 1 tablet (5 mg total) by mouth 2 (two) times daily. 60 tablet 6  . ARTIFICIAL TEARS 0.2-0.2-1 % SOLN     . CALCIUM 600 1500 (600 Ca) MG TABS tablet Take 600 mg of elemental calcium by mouth 2 (two) times daily.     . Calcium Carbonate-Vitamin D3 600-400 MG-UNIT TABS     . diltiazem (CARDIZEM CD) 120 MG 24 hr  capsule Take 1 capsule (120 mg total) by mouth daily as needed (IF YOUR HEART RATE GOES OVER 100). 90 capsule 1  . famotidine (PEPCID) 20 MG tablet Take 20 mg by mouth 2 (two) times a day.    . furosemide (LASIX) 40 MG tablet Take 1 tablet (40 mg total) by mouth daily. 1 tab twice daily for 5 days then resume 1 daily ./cy 35 tablet 6  . ibuprofen (ADVIL) 600 MG tablet     . indomethacin (INDOCIN) 50 MG capsule     . ipratropium (ATROVENT) 0.03 % nasal spray Place 1 spray into the nose daily as needed for rhinitis.     . magnesium oxide (MAG-OX) 400 MG tablet Take 1 tablet by mouth 2 (two) times daily.    . metoprolol tartrate (LOPRESSOR) 25 MG tablet Take 1 tablet (25 mg total) by mouth 2 (two) times daily. Hold SYSTOLIC BP <119, HR <41 30 tablet 0  . morphine (MS CONTIN) 15 MG 12 hr tablet Take 15 mg by mouth every 12 (twelve) hours.    . Multiple Vitamin (MULTIVITAMIN) tablet Take 1 tablet by mouth daily.    Marland Kitchen oxyCODONE (OXY IR/ROXICODONE) 5 MG immediate release tablet Take 1 tablet (5 mg total) by mouth every 4 (four) hours as needed for severe pain. In addition to current chronic pain medication 30 tablet 0  . potassium chloride SA (KLOR-CON) 20 MEQ tablet Take 20 mEq by mouth daily.    . sildenafil (VIAGRA) 100 MG tablet Take 100 mg by mouth daily as needed for erectile dysfunction.    . tamsulosin (FLOMAX) 0.4 MG CAPS capsule Take 0.4 mg by mouth.     . vitamin B-12 (CYANOCOBALAMIN) 1000 MCG tablet      No current facility-administered medications for this visit.    Allergies:   Patient has no known allergies.    Social History:  The patient  reports that he has never smoked. He has never used smokeless tobacco. He reports current alcohol use of about 2.0 - 4.0 standard drinks of alcohol per week. He reports that he does not use drugs.   Family History:  The patient's Family history is unknown by patient.    ROS: All other systems are reviewed and negative. Unless otherwise mentioned  in H&P    PHYSICAL EXAM: VS:  There were no vitals taken for this visit. , BMI There is no height or weight on file to calculate BMI. GEN: Well nourished, well developed, in no acute distress HEENT: normal Neck: no JVD, carotid bruits, or masses Cardiac: ***RRR; no murmurs, rubs, or gallops,no edema  Respiratory:  Clear to auscultation bilaterally, normal work of breathing GI: soft, nontender,  nondistended, + BS MS: no deformity or atrophy Skin: warm and dry, no rash Neuro:  Strength and sensation are intact Psych: euthymic mood, full affect   EKG:  EKG {ACTION; IS/IS [id number]} ordered today. The ekg ordered today demonstrates ***   Recent Labs: 03/02/2020: BUN 8; Creatinine, Ser 0.87; Hemoglobin 12.3; Platelets 162; Potassium 4.2; Sodium 139; TSH 3.080    Lipid Panel    Component Value Date/Time   CHOL 143 11/08/2016 1116   TRIG 246.0 (H) 11/08/2016 1116   HDL 44.90 11/08/2016 1116   CHOLHDL 3 11/08/2016 1116   VLDL 49.2 (H) 11/08/2016 1116   LDLDIRECT 64.0 11/08/2016 1116      Wt Readings from Last 3 Encounters:  04/13/20 204 lb 3.2 oz (92.6 kg)  03/15/20 205 lb (93 kg)  03/02/20 202 lb 3.2 oz (91.7 kg)      Other studies Reviewed Echocardiogram 10/01/2019 1. Left ventricular ejection fraction, by estimation, is 45 to 50%. The  left ventricle has mildly decreased function. The left ventricle  demonstrates global hypokinesis. Left ventricular diastolic function could  not be evaluated.  2. Right ventricular systolic function is moderately reduced. The right  ventricular size is severely enlarged. There is moderately elevated  pulmonary artery systolic pressure.  3. Left atrial size was severely dilated.  4. Right atrial size was severely dilated.  5. The mitral valve is normal in structure. Mild mitral valve  regurgitation.  6. Tricuspid valve regurgitation is mild to moderate.  7. The aortic valve is tricuspid. Aortic valve regurgitation is  trivial.  Mild aortic valve sclerosis is present, with no evidence of aortic valve  stenosis.  8. The inferior vena cava is dilated in size with <50% respiratory  variability, suggesting right atrial pressure of 15 mmHg.    ASSESSMENT AND PLAN:  1.  ***   Current medicines are reviewed at length with the patient today.  I have spent *** dedicated to the care of this patient on the date of this encounter to include pre-visit review of records, assessment, management and diagnostic testing,with shared decision making.  Labs/ tests ordered today include: *** Phill Myron. West Pugh, ANP, AACC   08/18/2020 6:56 PM    [facility] [place] [address] Office [phone] Fax [phone]  Notice: This dictation was prepared with Dragon dictation along with smaller phrase technology. Any transcriptional errors that result from this process are unintentional and may not be corrected upon review.

## 2020-08-20 ENCOUNTER — Ambulatory Visit: Payer: Medicare Other | Admitting: Adult Health

## 2020-08-24 ENCOUNTER — Ambulatory Visit: Payer: Medicare Other | Admitting: Cardiovascular Disease

## 2020-08-24 ENCOUNTER — Telehealth: Payer: Self-pay | Admitting: Cardiovascular Disease

## 2020-08-24 NOTE — Telephone Encounter (Signed)
Patient arrived 25 minutes late , spoke with Rexanne Mano , was told patient needed to to rescheduled , as Provider had left for lunch. Patient was upset declined offer to reschedule. Patient stated that we could call him to reschedule .

## 2020-09-13 ENCOUNTER — Ambulatory Visit (HOSPITAL_COMMUNITY)
Admission: RE | Admit: 2020-09-13 | Discharge: 2020-09-13 | Disposition: A | Discharge status: Home / Self Care | Payer: Medicare Other | Source: Ambulatory Visit | Attending: Physician Assistant | Admitting: Physician Assistant

## 2020-09-13 ENCOUNTER — Other Ambulatory Visit: Payer: Self-pay

## 2020-09-13 VITALS — BP 154/74 | HR 61 | Ht 73.0 in | Wt 194.6 lb

## 2020-09-13 DIAGNOSIS — Z7901 Long term (current) use of anticoagulants: Secondary | ICD-10-CM | POA: Insufficient documentation

## 2020-09-13 DIAGNOSIS — Z79899 Other long term (current) drug therapy: Secondary | ICD-10-CM | POA: Diagnosis not present

## 2020-09-13 DIAGNOSIS — D6869 Other thrombophilia: Secondary | ICD-10-CM | POA: Diagnosis not present

## 2020-09-13 DIAGNOSIS — I4892 Unspecified atrial flutter: Secondary | ICD-10-CM | POA: Insufficient documentation

## 2020-09-13 DIAGNOSIS — I5042 Chronic combined systolic (congestive) and diastolic (congestive) heart failure: Secondary | ICD-10-CM | POA: Insufficient documentation

## 2020-09-13 DIAGNOSIS — I480 Paroxysmal atrial fibrillation: Secondary | ICD-10-CM

## 2020-09-13 MED ORDER — FUROSEMIDE 40 MG PO TABS
40.0000 mg | ORAL_TABLET | Freq: Every day | ORAL | Status: DC
Start: 2020-09-13 — End: 2021-07-22

## 2020-09-13 NOTE — Progress Notes (Signed)
Primary Care Physician: [facility] Primary Cardiologist: Dr Gwenlyn Found Primary Electrophysiologist: none Referring Physician: Dr Floria Raveling is a 75 y.o. male with a history of combined systolic/diastolic CHF, Zenker's diverticulum, atrial flutter and paroxysmal atrial fibrillation who presents for consultation in the [facility].  The patient was initially diagnosed with atrial fibrillation 10/2018 after presenting with symptoms of increased SOB, weight gain, and lower extremity edema. Patient was started on Eliquis for a CHADS2VASC score of 1 and underwent DCCV on 11/04/19. His Eliquis was later discontinued with a low CHADS2VASC score. He was seen in follow up 09/29/19 and a repeat echo was ordered which showed decreased EF 45-50%. His CHADS2VASC score is now 2 and will soon be 3 when he turns 83 in March. He denies any awareness of his arrhythmia. He does have a home BP machine which will show if he is out of rhythm. He has episodes about every 3 weeks which will last about 24 hours. His heart rate is controlled even when he is out of rhythm.   On follow up today, patient reports that he has done well since his last visit. He denies any heart racing or palpitations. He also denies any bleeding issues on anticoagulation.   Today, he denies symptoms of palpitations, chest pain, shortness of breath, orthopnea, PND, lower extremity edema, dizziness, presyncope, syncope, snoring, daytime somnolence, bleeding, or neurologic sequela. The patient is tolerating medications without difficulties and is otherwise without complaint today.    Atrial Fibrillation Risk Factors:  he does not have symptoms or diagnosis of sleep apnea. he does not have a history of rheumatic fever. he does have a history of alcohol use. The patient does not have a history of early familial atrial fibrillation or other arrhythmias.  he has a BMI of Body mass index is 25.67  kg/m.Marland Kitchen Filed Weights   09/13/20 1348  Weight: 88.3 kg     Family History  Family history unknown: Yes     Atrial Fibrillation Management history:  Previous antiarrhythmic drugs: none Previous cardioversions: 11/04/19 Previous ablations: none CHADS2VASC score: 2 Anticoagulation history: Eliquis   Past Medical History:  Diagnosis Date   Atrial fibrillation with rapid ventricular response ([place]) 10/25/2018   Successful  DCCV shock x1 200 J 11/04/2018   Chronic back pain    buldging disc   Depression    GERD (gastroesophageal reflux disease)    GOUT    takes Allopurinol daily and Colchicine as needed   History of bronchitis    many yrs ago   History of colonoscopy    benign   History of shingles    Joint pain    Joint swelling    Muscle spasm    takes Robaxin as needed   Nocturia    OSTEOARTHRITIS    Peripheral edema    takes Lasix daily as needed   Pneumonia 1969   Seasonal allergies    takes Zyrtec and uses Flonase is needed   TESTICULAR HYPOFUNCTION    Zenker diverticula    Past Surgical History:  Procedure Laterality Date   ANKLE SURGERY     4 times   CAPSULOTOMY Right 08/20/2019   Procedure: SCAR CONTRACTURE AND CAPSULE RELEASE OF RIGHT BREAST TISSUE;  Surgeon: Wallace Going, DO;  Location: [facility];  Service: Plastics;  Laterality: Right;   CARDIOVERSION N/A 11/04/2018   Procedure: CARDIOVERSION;  Surgeon: Acie Fredrickson Wonda Cheng, MD;  Location: [place];  Service:  Cardiovascular;  Laterality: N/A;   COLONOSCOPY     ELBOW SURGERY Right    torn tendon   INGUINAL HERNIA REPAIR     right   KNEE SURGERY     3 on each knee   SCAR REVISION Right 08/20/2019   Procedure: SCAR REVISION;  Surgeon: Wallace Going, DO;  Location: [place];  Service: Plastics;  Laterality: Right;   SHOULDER ARTHROSCOPY Right 06/15/2015   Procedure: RIGHT SHOULDER ARTHROSCOPY, ACROMIOPLASTY, DEBRIDEMENT;  Surgeon: Melrose Nakayama, MD;   Location: [place];  Service: Orthopedics;  Laterality: Right;   shoulder arthrosopy Left    TONSILLECTOMY AND ADENOIDECTOMY     TUMOR EXCISION     sebaceous cyst removed from chest   ZENKER'S DIVERTICULECTOMY ENDOSCOPIC N/A 06/14/2016   Procedure: ZENKER'S DIVERTICULECTOMY ENDOSCOPIC;  Surgeon: Izora Gala, MD;  [facility];  Service: ENT;  Laterality: N/A;    Current Outpatient Medications  Medication Sig Dispense Refill   allopurinol (ZYLOPRIM) 100 MG tablet Take 300 mg by mouth daily.     apixaban (ELIQUIS) 5 MG TABS tablet Take 1 tablet (5 mg total) by mouth 2 (two) times daily. 60 tablet 6   ARTIFICIAL TEARS 0.2-0.2-1 % SOLN      CALCIUM 600 1500 (600 Ca) MG TABS tablet Take 600 mg of elemental calcium by mouth 2 (two) times daily.      cetirizine (ZYRTEC) 10 MG tablet TAKE ONE TABLET BY MOUTH TWO TIMES A DAY TAKE TWICE A DAY ONLY WHILE SICK; USE DAILY TO PREVENT NASAL DRIP IF  NEEDED     Cholecalciferol (VITAMIN D3) 125 MCG (5000 UT) TABS Take 1 tablet by mouth daily.     diltiazem (CARDIZEM CD) 120 MG 24 hr capsule Take 1 capsule (120 mg total) by mouth daily as needed (IF YOUR HEART RATE GOES OVER 100). 90 capsule 1   famotidine (PEPCID) 20 MG tablet Take 20 mg by mouth 2 (two) times a day.     fluticasone (FLONASE) 50 MCG/ACT nasal spray Place 2 sprays into both nostrils daily.     ibuprofen (ADVIL) 600 MG tablet Take 400 mg by mouth as needed.     indomethacin (INDOCIN) 50 MG capsule Take 50 mg by mouth as needed.     ipratropium (ATROVENT) 0.03 % nasal spray Place 1 spray into the nose daily as needed for rhinitis.      magnesium oxide (MAG-OX) 400 MG tablet Take 1 tablet by mouth 2 (two) times daily.     metoprolol tartrate (LOPRESSOR) 25 MG tablet Take 1 tablet (25 mg total) by mouth 2 (two) times daily. Hold SYSTOLIC BP <338, HR <32 30 tablet 0   morphine (MS CONTIN) 15 MG 12 hr tablet Take 15 mg by mouth every 12 (twelve) hours.     Multiple Vitamin (MULTIVITAMIN) tablet  Take 1 tablet by mouth daily.     oxyCODONE (OXY IR/ROXICODONE) 5 MG immediate release tablet Take 1 tablet (5 mg total) by mouth every 4 (four) hours as needed for severe pain. In addition to current chronic pain medication 30 tablet 0   potassium chloride SA (KLOR-CON) 20 MEQ tablet Take 20 mEq by mouth as needed. Along with the Lasix     sildenafil (VIAGRA) 100 MG tablet Take 100 mg by mouth daily as needed for erectile dysfunction.     tamsulosin (FLOMAX) 0.4 MG CAPS capsule Take 0.4 mg by mouth daily in the afternoon.     Testosterone 20.25 MG/ACT (1.62%) GEL  Apply topically.     vitamin B-12 (CYANOCOBALAMIN) 1000 MCG tablet Take 1,000 mcg by mouth daily.     furosemide (LASIX) 40 MG tablet Take 1 tablet (40 mg total) by mouth daily. Take one tablet by mouth as needed     No current facility-administered medications for this encounter.    No Known Allergies  Social History   Socioeconomic History   Marital status: Married    Spouse name: Not on file   Number of children: Not on file   Years of education: Not on file   Highest education level: Not on file  Occupational History   Not on file  Tobacco Use   Smoking status: Never   Smokeless tobacco: Never  Vaping Use   Vaping Use: Never used  Substance and Sexual Activity   Alcohol use: Yes    Alcohol/week: 2.0 - 4.0 standard drinks    Types: 1 - 2 Glasses of wine, 1 - 2 Cans of beer per week    Comment: daily   Drug use: No   Sexual activity: Not on file  Other Topics Concern   Not on file  Social History Narrative   Not on file   Social Determinants of Health   Financial Resource Strain: Not on file  Food Insecurity: Not on file  Transportation Needs: Not on file  Physical Activity: Not on file  Stress: Not on file  Social Connections: Not on file  Intimate Partner Violence: Not on file     ROS- All systems are reviewed and negative except as per the HPI above.  Physical Exam: Vitals:   09/13/20 1348  BP:  (!) 154/74  Pulse: 61  Weight: 88.3 kg  Height: 6\' 1"  (1.854 m)    GEN- The patient is a well appearing elderly male, alert and oriented x 3 today.   HEENT-head normocephalic, atraumatic, sclera clear, conjunctiva pink, hearing intact, trachea midline. Lungs- Clear to ausculation bilaterally, normal work of breathing Heart- Regular rate and rhythm, no murmurs, rubs or gallops  GI- soft, NT, ND, + BS Extremities- no clubbing, cyanosis, or edema MS- no significant deformity or atrophy Skin- no rash or lesion Psych- euthymic mood, full affect Neuro- strength and sensation are intact   Wt Readings from Last 3 Encounters:  09/13/20 88.3 kg  04/13/20 92.6 kg  03/15/20 93 kg    EKG today demonstrates  SR Vent. rate 61 BPM PR interval 190 ms QRS duration 96 ms QT/QTcB 410/412 ms  Echo 10/01/19 demonstrated   1. Left ventricular ejection fraction, by estimation, is 45 to 50%. The  left ventricle has mildly decreased function. The left ventricle  demonstrates global hypokinesis. Left ventricular diastolic function could  not be evaluated.   2. Right ventricular systolic function is moderately reduced. The right  ventricular size is severely enlarged. There is moderately elevated  pulmonary artery systolic pressure.   3. Left atrial size was severely dilated.   4. Right atrial size was severely dilated.   5. The mitral valve is normal in structure. Mild mitral valve  regurgitation.   6. Tricuspid valve regurgitation is mild to moderate.   7. The aortic valve is tricuspid. Aortic valve regurgitation is trivial.  Mild aortic valve sclerosis is present, with no evidence of aortic valve  stenosis.   8. The inferior vena cava is dilated in size with <50% respiratory  variability, suggesting right atrial pressure of 15 mmHg.   Comparison(s): The left ventricular function is worsened. The right  ventricular systolic function is worse.   Epic records are reviewed at length  today  CHA2DS2-VASc Score = 2  The patient's score is based upon: CHF History: Yes HTN History: No Diabetes History: No Stroke History: No Vascular Disease History: No Age Score: 1 Gender Score: 0      ASSESSMENT AND PLAN: 1. Paroxysmal Atrial Fibrillation/atrial flutter The patient's CHA2DS2-VASc score is 2, indicating a 2.2% annual risk of stroke.   Patient appears to be maintaining SR. Continue Eliquis 5 mg BID Continue diltiazem 120 mg daily Continue Lopressor 25 mg BID  2. Secondary Hypercoagulable State (ICD10:  D68.69) The patient is at significant risk for stroke/thromboembolism based upon his CHA2DS2-VASc Score of 2.  Continue Apixaban (Eliquis).   3. Chronic combined systolic/diastolic CHF EF 95-18% No signs or symptoms of fluid overload.   Follow up with Dr Gwenlyn Found as scheduled. AF clinic in one year.    [facility] [address] [place], [place] [address] [phone] 09/13/2020 2:27 PM

## 2021-01-11 ENCOUNTER — Ambulatory Visit (INDEPENDENT_AMBULATORY_CARE_PROVIDER_SITE_OTHER): Payer: Medicare Other | Admitting: Cardiovascular Disease

## 2021-01-11 ENCOUNTER — Encounter: Payer: Self-pay | Admitting: Cardiovascular Disease

## 2021-01-11 ENCOUNTER — Other Ambulatory Visit: Payer: Self-pay

## 2021-01-11 DIAGNOSIS — I5043 Acute on chronic combined systolic (congestive) and diastolic (congestive) heart failure: Secondary | ICD-10-CM | POA: Diagnosis not present

## 2021-01-11 DIAGNOSIS — I4891 Unspecified atrial fibrillation: Secondary | ICD-10-CM | POA: Diagnosis not present

## 2021-01-11 NOTE — Assessment & Plan Note (Signed)
History of PAF maintaining sinus rhythm on Eliquis oral anticoagulation and beta-blocker.

## 2021-01-11 NOTE — Patient Instructions (Signed)
Medication Instructions:  Your physician recommends that you continue on your current medications as directed. Please refer to the Current Medication list given to you today.  *If you need a refill on your cardiac medications before your next appointment, please call your pharmacy*   Testing/Procedures: Your physician has requested that you have an echocardiogram. Echocardiography is a painless test that uses sound waves to create images of your heart. It provides your doctor with information about the size and shape of your heart and how well your heart's chambers and valves are working. This procedure takes approximately one hour. There are no restrictions for this procedure. This procedure is done at [address].   Follow-Up: At [facility], you and your health needs are our priority.  As part of our continuing mission to provide you with exceptional heart care, we have created designated Provider Care Teams.  These Care Teams include your primary Cardiologist (physician) and Advanced Practice Providers (APPs -  Physician Assistants and Nurse Practitioners) who all work together to provide you with the care you need, when you need it.  We recommend signing up for the patient portal called "MyChart".  Sign up information is provided on this After Visit Summary.  MyChart is used to connect with patients for Virtual Visits (Telemedicine).  Patients are able to view lab/test results, encounter notes, upcoming appointments, etc.  Non-urgent messages can be sent to your provider as well.   To learn more about what you can do with MyChart, go to [email or web address].    Your next appointment:   12 month(s)  The format for your next appointment:   In Person  Provider:   Quay Burow, MD

## 2021-01-11 NOTE — Progress Notes (Signed)
01/11/2021 [name]   Sep 13, 1945  [id number]  Primary Physician [facility], [facility] Cardiologist: Lorretta Harp MD Garret Reddish, [place], [place]  HPI:  [name] is a 75 y.o.  mildly overweight married Caucasian male father of 2, grandfather of 1 grandchild who I initially saw in consultation 02/04/2019 when he was admitted with diastolic heart failure and A. fib with RVR.  I last saw him in the office 01/20/2020.  He is retired from the [employer].  He does not smoke.  He drinks daily but only 2 beers a night and occasional wine with dinner.  He has no history of diabetes, hypertension or hyperlipidemia.  Never had a heart attack or stroke.  There is no family's for heart disease.  He is fairly active.  He did play division 3 football and hockey in his younger years.  He is unaware that he is in A. fib at this point.     Since I saw him in the office 1 year ago he continues to do well.  He is very active.  He denies chest pain or shortness of breath.  He remains in sinus rhythm on Eliquis oral anticoagulation.   Current Meds  Medication Sig   allopurinol (ZYLOPRIM) 100 MG tablet Take 300 mg by mouth daily.   apixaban (ELIQUIS) 5 MG TABS tablet Take 1 tablet (5 mg total) by mouth 2 (two) times daily.   ARTIFICIAL TEARS 0.2-0.2-1 % SOLN    CALCIUM 600 1500 (600 Ca) MG TABS tablet Take 600 mg of elemental calcium by mouth 2 (two) times daily.    Cholecalciferol (VITAMIN D3) 125 MCG (5000 UT) TABS Take 1 tablet by mouth daily.   diltiazem (CARDIZEM CD) 120 MG 24 hr capsule Take 1 capsule (120 mg total) by mouth daily as needed (IF YOUR HEART RATE GOES OVER 100).   fluticasone (FLONASE) 50 MCG/ACT nasal spray Place 2 sprays into both nostrils daily.   furosemide (LASIX) 40 MG tablet Take 1 tablet (40 mg total) by mouth daily. Take one tablet by mouth as needed   ibuprofen (ADVIL) 600 MG tablet Take 400 mg by mouth as needed.   indomethacin (INDOCIN) 50 MG capsule Take 50  mg by mouth as needed.   ipratropium (ATROVENT) 0.03 % nasal spray Place 1 spray into the nose daily as needed for rhinitis.    magnesium oxide (MAG-OX) 400 MG tablet Take 1 tablet by mouth 2 (two) times daily.   metoprolol tartrate (LOPRESSOR) 25 MG tablet Take 1 tablet (25 mg total) by mouth 2 (two) times daily. Hold SYSTOLIC BP <376, HR <28   morphine (MS CONTIN) 15 MG 12 hr tablet Take 15 mg by mouth every 12 (twelve) hours.   Multiple Vitamin (MULTIVITAMIN) tablet Take 1 tablet by mouth daily.   oxyCODONE (OXY IR/ROXICODONE) 5 MG immediate release tablet Take 1 tablet (5 mg total) by mouth every 4 (four) hours as needed for severe pain. In addition to current chronic pain medication   potassium chloride SA (KLOR-CON) 20 MEQ tablet Take 20 mEq by mouth as needed. Along with the Lasix   sildenafil (VIAGRA) 100 MG tablet Take 100 mg by mouth daily as needed for erectile dysfunction.   tamsulosin (FLOMAX) 0.4 MG CAPS capsule Take 0.4 mg by mouth daily in the afternoon.   Testosterone 20.25 MG/ACT (1.62%) GEL Apply topically.   vitamin B-12 (CYANOCOBALAMIN) 1000 MCG tablet Take 1,000 mcg by mouth daily.     No  Known Allergies  Social History   Socioeconomic History   Marital status: Married    Spouse name: Not on file   Number of children: Not on file   Years of education: Not on file   Highest education level: Not on file  Occupational History   Not on file  Tobacco Use   Smoking status: Never   Smokeless tobacco: Never  Vaping Use   Vaping Use: Never used  Substance and Sexual Activity   Alcohol use: Yes    Alcohol/week: 2.0 - 4.0 standard drinks    Types: 1 - 2 Glasses of wine, 1 - 2 Cans of beer per week    Comment: daily   Drug use: No   Sexual activity: Not on file  Other Topics Concern   Not on file  Social History Narrative   Not on file   Social Determinants of Health   Financial Resource Strain: Not on file  Food Insecurity: Not on file  Transportation Needs:  Not on file  Physical Activity: Not on file  Stress: Not on file  Social Connections: Not on file  Intimate Partner Violence: Not on file     Review of Systems: General: negative for chills, fever, night sweats or weight changes.  Cardiovascular: negative for chest pain, dyspnea on exertion, edema, orthopnea, palpitations, paroxysmal nocturnal dyspnea or shortness of breath Dermatological: negative for rash Respiratory: negative for cough or wheezing Urologic: negative for hematuria Abdominal: negative for nausea, vomiting, diarrhea, bright red blood per rectum, melena, or hematemesis Neurologic: negative for visual changes, syncope, or dizziness All other systems reviewed and are otherwise negative except as noted above.    Blood pressure 132/74, pulse (!) 59, height 6\' 1"  (1.854 m), weight 203 lb (92.1 kg).  General appearance: alert and no distress Neck: no adenopathy, no carotid bruit, no JVD, supple, symmetrical, trachea midline, and thyroid not enlarged, symmetric, no tenderness/mass/nodules Lungs: clear to auscultation bilaterally Heart: regular rate and rhythm, S1, S2 normal, no murmur, click, rub or gallop Extremities: extremities normal, atraumatic, no cyanosis or edema Pulses: 2+ and symmetric Skin: Skin color, texture, turgor normal. No rashes or lesions Neurologic: Grossly normal  EKG sinus bradycardia 59 without ST or T wave changes.  Personally reviewed this EKG.  ASSESSMENT AND PLAN:   Atrial fibrillation with RVR (HCC) History of PAF maintaining sinus rhythm on Eliquis oral anticoagulation and beta-blocker.  Acute on chronic combined systolic and diastolic CHF (congestive heart failure) (HCC) History of mild LV dysfunction with EF by 2D echo 10/01/2019 with 45 to 50% with mild global hypokinesia and moderately elevated pulmonary artery systolic pressure.  There were no significant valvular abnormalities.  Patient is completely asymptomatic.     Lorretta Harp MD FACP,FACC,FAHA, [facility] 01/11/2021 2:57 PM

## 2021-01-11 NOTE — Assessment & Plan Note (Signed)
History of mild LV dysfunction with EF by 2D echo 10/01/2019 with 45 to 50% with mild global hypokinesia and moderately elevated pulmonary artery systolic pressure.  There were no significant valvular abnormalities.  Patient is completely asymptomatic.

## 2021-01-26 ENCOUNTER — Encounter (HOSPITAL_COMMUNITY): Payer: Self-pay | Admitting: Cardiovascular Disease

## 2021-01-26 ENCOUNTER — Ambulatory Visit (HOSPITAL_COMMUNITY): Payer: Medicare Other | Attending: Cardiology

## 2021-02-14 ENCOUNTER — Other Ambulatory Visit: Payer: Self-pay

## 2021-02-14 ENCOUNTER — Ambulatory Visit (HOSPITAL_COMMUNITY): Payer: Medicare Other | Attending: Internal Medicine

## 2021-02-14 DIAGNOSIS — I5043 Acute on chronic combined systolic (congestive) and diastolic (congestive) heart failure: Secondary | ICD-10-CM | POA: Insufficient documentation

## 2021-02-14 LAB — ECHOCARDIOGRAM COMPLETE
Area-P 1/2: 3.61 cm2
P 1/2 time: 567 msec
S' Lateral: 4 cm

## 2021-03-11 ENCOUNTER — Telehealth: Payer: Self-pay | Admitting: Cardiovascular Disease

## 2021-03-11 NOTE — Telephone Encounter (Signed)
Spoke with pt regarding new medication that his PCP wants to start him on for bursitis. Pt is to start celebrex (celecoxib) 100mg . Pt wants to check and make sure that this medication will not interact with any of his cardiac medications. Will send to pharmd pool to advise.

## 2021-03-11 NOTE — Telephone Encounter (Signed)
Patient is requesting to speak with Dr. Kennon Holter nurse. He states he has concerns regarding one of his medications interacting with other cardiac medications. He states he will go into detail when he speaks with the nurse. Please return call to discuss when able.

## 2021-03-11 NOTE — Telephone Encounter (Signed)
Left detailed message for pt. Instructed to call back with questions.

## 2021-03-11 NOTE — Telephone Encounter (Signed)
Patient has mutiple pain medications on med list and is currently on Eliquis. Unclear which of these he are taking.  NSAIDs: ibuprofen, indomethacin Opioids: Oxycodone, Morphine  Celebrex is also an NSAID which can increase risk of bleeding with Eliquis and also is duplicating therapy with ibuprofen and indomethacin.  In addition, Celebrex plus metoprolol can increase blood pressure.  Unsure if provider who prescribed his Celebrex is aware he is on multiple other pain medications plus an oral anticoagulant.  Do not recommend Celebrex with Eliquis

## 2021-03-11 NOTE — Telephone Encounter (Signed)
Pt called back to discuss pharmacy's recommendations. Pt states that he does not plan to take celebrex moving forward.  Pt does ask if voltaren gel is ok to use on knee and hip joint that is causing him issues with bursitis. Will send back to pharmacy to advise.

## 2021-03-14 NOTE — Telephone Encounter (Signed)
Ok to use Voltaren gel, it's not absorbed systemically as much as oral NSAIDs are and would be a safer option for him to take.

## 2021-03-22 NOTE — Telephone Encounter (Signed)
Attempted to return pt's call. Call can not be completed at this time.

## 2021-07-18 ENCOUNTER — Ambulatory Visit
Admission: EM | Admit: 2021-07-18 | Discharge: 2021-07-18 | Disposition: A | Discharge status: Home / Self Care | Payer: Non-veteran care

## 2021-07-18 ENCOUNTER — Encounter: Payer: Self-pay | Admitting: Emergency Medicine

## 2021-07-18 DIAGNOSIS — S46212A Strain of muscle, fascia and tendon of other parts of biceps, left arm, initial encounter: Secondary | ICD-10-CM

## 2021-07-18 NOTE — Discharge Instructions (Addendum)
See an orthopedic specialist such as the one listed below.   ? ? ?

## 2021-07-18 NOTE — ED Triage Notes (Signed)
Pt presents with left arm pain x 1 week. Pt denies any injury.  ?

## 2021-07-18 NOTE — ED Provider Notes (Signed)
?[facility] ? ? ? ?CSN: [id number] ?Arrival date & time: 07/18/21  1507 ? ? ?  ? ?History   ?Chief Complaint ?Chief Complaint  ?Patient presents with  ? Arm Pain  ? ? ?HPI ?BOLIVAR KORANDA is a 76 y.o. male.  Patient presents with pain in his left bicep muscle x1 week.  No known injury.  The pain is worse with lifting heavy object and patient notes that his biceps muscle constricts into a ball-like structure when he flexes it.  He is concerned for torn muscle.  He denies numbness, weakness, paresthesias, chest pain, shortness of breath, or other symptoms.  No treatments at home.  He was told by the [facility] to come here for referral to a specialist if needed.  His medical history includes atrial fibrillation, heart failure, pulmonary nodule, low back pain, osteoarthritis, chronic venous insufficiency, chronic pain.  Patient is on Eliquis. ? ?The history is provided by the patient and medical records.  ? ?Past Medical History:  ?Diagnosis Date  ? Atrial fibrillation with rapid ventricular response ([place]) 10/25/2018  ? Successful  DCCV shock x1 200 J 11/04/2018  ? Chronic back pain   ? buldging disc  ? Depression   ? GERD (gastroesophageal reflux disease)   ? GOUT   ? takes Allopurinol daily and Colchicine as needed  ? History of bronchitis   ? many yrs ago  ? History of colonoscopy   ? benign  ? History of shingles   ? Joint pain   ? Joint swelling   ? Muscle spasm   ? takes Robaxin as needed  ? Nocturia   ? OSTEOARTHRITIS   ? Peripheral edema   ? takes Lasix daily as needed  ? Pneumonia 1969  ? Seasonal allergies   ? takes Zyrtec and uses Flonase is needed  ? TESTICULAR HYPOFUNCTION   ? Zenker diverticula   ? ? ?Patient Active Problem List  ? Diagnosis Date Noted  ? Secondary hypercoagulable state ([place]) 01/28/2020  ? Brow ptosis, bilateral 09/12/2019  ? Lower extremity edema 09/09/2019  ? Breast pain 06/17/2019  ? Acquired absence of nipple, right 06/17/2019  ? Anticoagulated 10/25/2018  ? Acute on chronic combined  systolic and diastolic CHF (congestive heart failure) ([place]) 10/25/2018  ? Chronic pain 10/12/2018  ? Macrocytic anemia 10/12/2018  ? Atrial fibrillation ([place]) 10/12/2018  ? Atrial fibrillation with RVR ([place]) 10/11/2018  ? Oropharyngeal dysphagia 08/14/2017  ? Sore throat 07/21/2016  ? Vasomotor rhinitis 06/23/2016  ? Zenker's diverticulum 06/14/2016  ? Chronic venous insufficiency 06/28/2015  ? Vitamin B12 deficiency 02/07/2013  ? History of colonic polyps 09/15/2010  ? Weakness generalized 06/09/2010  ? TESTICULAR HYPOFUNCTION 01/04/2010  ? PULMONARY NODULE, LEFT LOWER LOBE 01/04/2010  ? Allergic rhinitis 06/17/2009  ? WEIGHT LOSS 03/17/2009  ? GOUT 05/04/2008  ? Osteoarthritis 05/04/2008  ? LOW BACK PAIN 05/04/2008  ? ? ?Past Surgical History:  ?Procedure Laterality Date  ? ANKLE SURGERY    ? 4 times  ? CAPSULOTOMY Right 08/20/2019  ? Procedure: SCAR CONTRACTURE AND CAPSULE RELEASE OF RIGHT BREAST TISSUE;  Surgeon: Wallace Going, DO;  Location: [facility];  Service: Plastics;  Laterality: Right;  ? CARDIOVERSION N/A 11/04/2018  ? Procedure: CARDIOVERSION;  Surgeon: Thayer Headings, MD;  Location: [facility] ENDOSCOPY;  Service: Cardiovascular;  Laterality: N/A;  ? COLONOSCOPY    ? ELBOW SURGERY Right   ? torn tendon  ? INGUINAL HERNIA REPAIR    ? right  ?  KNEE SURGERY    ? 3 on each knee  ? SCAR REVISION Right 08/20/2019  ? Procedure: SCAR REVISION;  Surgeon: Wallace Going, DO;  Location: [facility];  Service: Plastics;  Laterality: Right;  ? SHOULDER ARTHROSCOPY Right 06/15/2015  ? Procedure: RIGHT SHOULDER ARTHROSCOPY, ACROMIOPLASTY, DEBRIDEMENT;  Surgeon: Melrose Nakayama, MD;  Location: [facility];  Service: Orthopedics;  Laterality: Right;  ? shoulder arthrosopy Left   ? TONSILLECTOMY AND ADENOIDECTOMY    ? TUMOR EXCISION    ? sebaceous cyst removed from chest  ? ZENKER'S DIVERTICULECTOMY ENDOSCOPIC N/A 06/14/2016  ? Procedure: ZLDJTT'S DIVERTICULECTOMY ENDOSCOPIC;  Surgeon: Izora Gala, MD;  Location: [facility];  Service: ENT;  Laterality: N/A;  ? ? ? ? ? ?Home Medications   ? ?Prior to Admission medications   ?Medication Sig Start Date End Date Taking? Authorizing Provider  ?allopurinol (ZYLOPRIM) 100 MG tablet Take 300 mg by mouth daily.   Yes [provider]  ?apixaban (ELIQUIS) 5 MG TABS tablet Take 1 tablet (5 mg total) by mouth 2 (two) times daily. 03/04/20  Yes Fenton, Clint R, PA  ?ARTIFICIAL TEARS 0.2-0.2-1 % SOLN  12/31/18  Yes [provider]  ?CALCIUM 600 1500 (600 Ca) MG TABS tablet Take 600 mg of elemental calcium by mouth 2 (two) times daily.  02/16/16  Yes [provider]  ?Cholecalciferol (VITAMIN D3) 125 MCG (5000 UT) TABS Take 1 tablet by mouth daily.   Yes [provider]  ?diltiazem (CARDIZEM CD) 120 MG 24 hr capsule Take 1 capsule (120 mg total) by mouth daily as needed (IF YOUR HEART RATE GOES OVER 100). 11/28/18  Yes Cleaver, Jossie Ng, NP  ?DULoxetine (CYMBALTA) 60 MG capsule Take 1 capsule by mouth daily. 05/25/20  Yes [provider]  ?fluticasone (FLONASE) 50 MCG/ACT nasal spray Place 2 sprays into both nostrils daily. 07/13/20  Yes [provider]  ?gabapentin (NEURONTIN) 100 MG capsule TAKE TWO CAPSULES BY MOUTH TWO TIMES A DAY FOR NEUROPATHIC PAIN 05/24/21  Yes [provider]  ?magnesium oxide (MAG-OX) 400 MG tablet Take 1 tablet by mouth 2 (two) times daily. 07/21/19  Yes [provider]  ?metoprolol tartrate (LOPRESSOR) 25 MG tablet Take 1 tablet (25 mg total) by mouth 2 (two) times daily. Hold SYSTOLIC BP <177, HR <93 12/05/98  Yes Cleaver, Jossie Ng, NP  ?Multiple Vitamin (MULTIVITAMIN) tablet Take 1 tablet by mouth daily.   Yes [provider]  ?potassium chloride SA (KLOR-CON) 20 MEQ tablet Take 20 mEq by mouth as needed. Along with the Lasix 07/15/19  Yes [provider]  ?sildenafil (VIAGRA) 100 MG tablet Take 100 mg by mouth daily as needed for erectile dysfunction.   Yes  [provider]  ?Testosterone 20.25 MG/ACT (1.62%) GEL Apply topically. 06/05/20  Yes [provider]  ?vitamin B-12 (CYANOCOBALAMIN) 1000 MCG tablet Take 1,000 mcg by mouth daily. 12/31/18  Yes [provider]  ?CELEBREX 100 MG capsule Take by mouth. 03/09/21   [provider]  ?famotidine (PEPCID) 20 MG tablet Take 20 mg by mouth 2 (two) times a day. ?Patient not taking: Reported on 01/11/2021    [provider]  ?furosemide (LASIX) 40 MG tablet Take 1 tablet (40 mg total) by mouth daily. Take one tablet by mouth as needed 09/13/20   Lorretta Harp, MD  ?ibuprofen (ADVIL) 600 MG tablet Take 400 mg by mouth as needed. 12/31/18   [provider]  ?indomethacin (INDOCIN) 50 MG capsule  Take 50 mg by mouth as needed. 12/31/18   [provider]  ?ipratropium (ATROVENT) 0.03 % nasal spray Place 1 spray into the nose daily as needed for rhinitis.  06/23/16   [provider]  ?morphine (MS CONTIN) 15 MG 12 hr tablet Take 15 mg by mouth every 12 (twelve) hours.    [provider]  ?oxyCODONE (OXY IR/ROXICODONE) 5 MG immediate release tablet Take 1 tablet (5 mg total) by mouth every 4 (four) hours as needed for severe pain. In addition to current chronic pain medication 06/15/15   Loni Dolly, PA-C  ?tamsulosin (FLOMAX) 0.4 MG CAPS capsule Take 0.4 mg by mouth daily in the afternoon.    [provider]  ? ? ?Family History ?Family History  ?Family history unknown: Yes  ? ? ?Social History ?Social History  ? ?Tobacco Use  ? Smoking status: Never  ? Smokeless tobacco: Never  ?Vaping Use  ? Vaping Use: Never used  ?Substance Use Topics  ? Alcohol use: Yes  ?  Alcohol/week: 2.0 - 4.0 standard drinks  ?  Types: 1 - 2 Glasses of wine, 1 - 2 Cans of beer per week  ?  Comment: daily  ? Drug use: No  ? ? ? ?Allergies   ?Patient has no known allergies. ? ? ?Review of Systems ?Review of Systems  ?Constitutional:  Negative for chills and fever.   ?Respiratory:  Negative for cough and shortness of breath.   ?Cardiovascular:  Negative for chest pain and palpitations.  ?Musculoskeletal:  Positive for myalgias. Negative for arthralgias, gait problem and joint swel

## 2021-07-21 ENCOUNTER — Encounter (HOSPITAL_COMMUNITY): Payer: Self-pay

## 2021-07-21 ENCOUNTER — Inpatient Hospital Stay (HOSPITAL_COMMUNITY)
Admission: EM | Admit: 2021-07-21 | Discharge: 2021-07-22 | DRG: 378 | Disposition: A | Discharge status: Home / Self Care | Payer: Medicare Other | Attending: Family Medicine | Admitting: Family Medicine

## 2021-07-21 ENCOUNTER — Other Ambulatory Visit: Payer: Self-pay

## 2021-07-21 DIAGNOSIS — D649 Anemia, unspecified: Secondary | ICD-10-CM | POA: Diagnosis not present

## 2021-07-21 DIAGNOSIS — J309 Allergic rhinitis, unspecified: Secondary | ICD-10-CM | POA: Diagnosis not present

## 2021-07-21 DIAGNOSIS — M109 Gout, unspecified: Secondary | ICD-10-CM | POA: Diagnosis present

## 2021-07-21 DIAGNOSIS — Z79899 Other long term (current) drug therapy: Secondary | ICD-10-CM

## 2021-07-21 DIAGNOSIS — I5042 Chronic combined systolic (congestive) and diastolic (congestive) heart failure: Secondary | ICD-10-CM | POA: Diagnosis not present

## 2021-07-21 DIAGNOSIS — G894 Chronic pain syndrome: Secondary | ICD-10-CM | POA: Diagnosis present

## 2021-07-21 DIAGNOSIS — K219 Gastro-esophageal reflux disease without esophagitis: Secondary | ICD-10-CM | POA: Diagnosis present

## 2021-07-21 DIAGNOSIS — K625 Hemorrhage of anus and rectum: Secondary | ICD-10-CM | POA: Diagnosis present

## 2021-07-21 DIAGNOSIS — F32A Depression, unspecified: Secondary | ICD-10-CM | POA: Diagnosis not present

## 2021-07-21 DIAGNOSIS — Z79891 Long term (current) use of opiate analgesic: Secondary | ICD-10-CM

## 2021-07-21 DIAGNOSIS — R451 Restlessness and agitation: Secondary | ICD-10-CM | POA: Diagnosis present

## 2021-07-21 DIAGNOSIS — D62 Acute posthemorrhagic anemia: Secondary | ICD-10-CM | POA: Diagnosis present

## 2021-07-21 DIAGNOSIS — K922 Gastrointestinal hemorrhage, unspecified: Secondary | ICD-10-CM | POA: Diagnosis present

## 2021-07-21 DIAGNOSIS — D7589 Other specified diseases of blood and blood-forming organs: Secondary | ICD-10-CM | POA: Diagnosis present

## 2021-07-21 DIAGNOSIS — D6832 Hemorrhagic disorder due to extrinsic circulating anticoagulants: Secondary | ICD-10-CM | POA: Diagnosis present

## 2021-07-21 DIAGNOSIS — Z791 Long term (current) use of non-steroidal anti-inflammatories (NSAID): Secondary | ICD-10-CM

## 2021-07-21 DIAGNOSIS — K264 Chronic or unspecified duodenal ulcer with hemorrhage: Secondary | ICD-10-CM | POA: Diagnosis not present

## 2021-07-21 DIAGNOSIS — N400 Enlarged prostate without lower urinary tract symptoms: Secondary | ICD-10-CM | POA: Diagnosis present

## 2021-07-21 DIAGNOSIS — T39315A Adverse effect of propionic acid derivatives, initial encounter: Secondary | ICD-10-CM | POA: Diagnosis present

## 2021-07-21 DIAGNOSIS — E538 Deficiency of other specified B group vitamins: Secondary | ICD-10-CM | POA: Diagnosis not present

## 2021-07-21 DIAGNOSIS — I4891 Unspecified atrial fibrillation: Secondary | ICD-10-CM | POA: Diagnosis present

## 2021-07-21 DIAGNOSIS — K921 Melena: Secondary | ICD-10-CM

## 2021-07-21 DIAGNOSIS — Z5329 Procedure and treatment not carried out because of patient's decision for other reasons: Secondary | ICD-10-CM | POA: Diagnosis not present

## 2021-07-21 DIAGNOSIS — Z7901 Long term (current) use of anticoagulants: Secondary | ICD-10-CM | POA: Diagnosis not present

## 2021-07-21 DIAGNOSIS — K269 Duodenal ulcer, unspecified as acute or chronic, without hemorrhage or perforation: Secondary | ICD-10-CM

## 2021-07-21 LAB — CBC
HCT: 28.8 % — ABNORMAL LOW (ref 39.0–52.0)
Hemoglobin: 9.7 g/dL — ABNORMAL LOW (ref 13.0–17.0)
MCH: 35.1 pg — ABNORMAL HIGH (ref 26.0–34.0)
MCHC: 33.7 g/dL (ref 30.0–36.0)
MCV: 104.3 fL — ABNORMAL HIGH (ref 80.0–100.0)
Platelets: 201 10*3/uL (ref 150–400)
RBC: 2.76 MIL/uL — ABNORMAL LOW (ref 4.22–5.81)
RDW: 13.8 % (ref 11.5–15.5)
WBC: 6.5 10*3/uL (ref 4.0–10.5)
nRBC: 0 % (ref 0.0–0.2)

## 2021-07-21 LAB — COMPREHENSIVE METABOLIC PANEL
ALT: 21 U/L (ref 0–44)
AST: 25 U/L (ref 15–41)
Albumin: 3.3 g/dL — ABNORMAL LOW (ref 3.5–5.0)
Alkaline Phosphatase: 94 U/L (ref 38–126)
Anion gap: 9 (ref 5–15)
BUN: 43 mg/dL — ABNORMAL HIGH (ref 8–23)
CO2: 25 mmol/L (ref 22–32)
Calcium: 9 mg/dL (ref 8.9–10.3)
Chloride: 104 mmol/L (ref 98–111)
Creatinine, Ser: 0.77 mg/dL (ref 0.61–1.24)
GFR, Estimated: >60 mL/min (ref >60)
Glucose, Bld: 141 mg/dL — ABNORMAL HIGH (ref 70–99)
Potassium: 4.1 mmol/L (ref 3.5–5.1)
Sodium: 138 mmol/L (ref 135–145)
Total Bilirubin: 1 mg/dL (ref 0.3–1.2)
Total Protein: 7.1 g/dL (ref 6.5–8.1)

## 2021-07-21 LAB — POC OCCULT BLOOD, ED: Fecal Occult Bld: POSITIVE — AB

## 2021-07-21 LAB — ABO/RH: ABO/RH(D): O POS

## 2021-07-21 LAB — MRSA NEXT GEN BY PCR, NASAL: MRSA by PCR Next Gen: NOT DETECTED

## 2021-07-21 MED ORDER — ACETAMINOPHEN 325 MG PO TABS: 650.0000 mg | ORAL_TABLET | Freq: Four times a day (QID) | ORAL | Status: DC | PRN

## 2021-07-21 MED ORDER — PANTOPRAZOLE SODIUM 40 MG IV SOLR
40.0000 mg | Freq: Once | INTRAVENOUS | Status: AC
  Administered 2021-07-21: 40 mg via INTRAVENOUS
  Filled 2021-07-21: qty 10

## 2021-07-21 MED ORDER — PANTOPRAZOLE 80MG IVPB - SIMPLE MED
80.0000 mg | Freq: Once | INTRAVENOUS | Status: AC
  Administered 2021-07-21: 80 mg via INTRAVENOUS
  Filled 2021-07-21: qty 80

## 2021-07-21 MED ORDER — PANTOPRAZOLE INFUSION (NEW) - SIMPLE MED
8.0000 mg/h | INTRAVENOUS | Status: DC
  Administered 2021-07-21 – 2021-07-22 (×3): 8 mg/h via INTRAVENOUS
  Filled 2021-07-21 (×3): qty 80
  Filled 2021-07-21: qty 100

## 2021-07-21 MED ORDER — PANTOPRAZOLE SODIUM 40 MG IV SOLR: 40.0000 mg | Freq: Two times a day (BID) | INTRAVENOUS | Status: DC

## 2021-07-21 MED ORDER — SODIUM CHLORIDE 0.9 % IV SOLN: INTRAVENOUS | Status: DC

## 2021-07-21 MED ORDER — METOPROLOL TARTRATE 25 MG PO TABS
25.0000 mg | ORAL_TABLET | Freq: Two times a day (BID) | ORAL | Status: DC
  Administered 2021-07-21 – 2021-07-22 (×2): 25 mg via ORAL
  Filled 2021-07-21 (×2): qty 1

## 2021-07-21 MED ORDER — TAMSULOSIN HCL 0.4 MG PO CAPS: 0.4000 mg | ORAL_CAPSULE | Freq: Every day | ORAL | Status: DC

## 2021-07-21 MED ORDER — MORPHINE SULFATE ER 15 MG PO TBCR
15.0000 mg | EXTENDED_RELEASE_TABLET | Freq: Two times a day (BID) | ORAL | Status: DC
  Administered 2021-07-21 – 2021-07-22 (×2): 15 mg via ORAL
  Filled 2021-07-21 (×2): qty 1

## 2021-07-21 MED ORDER — OXYCODONE HCL 5 MG PO TABS
5.0000 mg | ORAL_TABLET | ORAL | Status: DC | PRN
Start: 2021-07-21 — End: 2021-07-22
  Administered 2021-07-21 – 2021-07-22 (×2): 5 mg via ORAL
  Filled 2021-07-21 (×2): qty 1

## 2021-07-21 NOTE — ED Provider Triage Note (Signed)
Emergency Medicine Provider Triage Evaluation Note ? ?[name] , a 76 y.o. male  was evaluated in triage.  Pt complains of GI bleed.  Patient states that in the past 2 days he has had 6 episodes of rectal bleeding that looks like coffee grounds.  Patient has images of the same on his phone.  Patient denies abdominal pain.  Patient states that when he feels he needs to go to the bathroom it is urgent and it is mostly blood.  He denies shortness of breath, denies chest pain.  Endorses weakness and dizziness when standing since the bleeding began ? ?Review of Systems  ?Positive: Rectal bleed, dizziness, weakness ?Negative: Chest pain, shortness of breath ? ?Physical Exam  ?BP 128/73 (BP Location: Left Arm)   Pulse (!) 102   Temp 97.7 ?F (36.5 ?C) (Oral)   Resp 18   SpO2 100%  ?Gen:   Awake, no distress   ?Resp:  Normal effort  ?MSK:   Moves extremities without difficulty  ?Other:   ? ?Medical Decision Making  ?Medically screening exam initiated at 3:09 PM.  Appropriate orders placed.  Quaid Yeakle Kimery was informed that the remainder of the evaluation will be completed by another provider, this initial triage assessment does not replace that evaluation, and the importance of remaining in the ED until their evaluation is complete. ? ? ?  ?Dorothyann Peng, PA-C ?07/21/21 1510 ? ?

## 2021-07-21 NOTE — Hospital Course (Addendum)
[name] is a 76 y.o.male with a history of combined diastolic/systolic heart failure, BPH, macrocytic anemia, history of alcohol use disorder, allergic rhinitis, gout who was admitted to the family medicine teaching Service at [facility] for melena with acute GI bleed. His hospital course is detailed below: ? ?Upper GI bleed ?Patient presented with several days of melena while on Eliquis for A-fib, FOBT positive in ED.  Initially anemic to 9.7 and overnight dropped to 7.1.  Patient declined transfusion and hemoglobin was trended stable.  GI was consulted and patient had EGD on 4/21 that showed nonbleeding duodenal ulcer with no stigmata of bleeding, likely due to ibuprofen.  While in the hospital, Eliquis was held with recommendations to continue to hold for 2 days (to resume on 4/23).  Patient continued to decline transfusion as the hemoglobin was not decreasing, it was 7.3 upon discharge.  Protonix BID '40mg'$  x8 weeks then transition to once daily per GI. Patient recommended to follow-up with his regular GI in [place] for follow-up. Await pathology results from upper endoscope. Consider upper endoscopy in 8 weeks to check healing. ?  ? ?History of macrocytic anemia history of alcohol use disorder ?CIWA protocol was initiated upon admission and were largely negative.  Patient did not require any medications and had no signs of alcohol withdrawal.  Labs significant for macrocytosis, home B12 was continued.  ? ?A-fib ?Rate controlled with metoprolol during admission.  Home Eliquis was held due to GI bleed and instructed to resume 2 days after EGD procedure on 4/23. ? ?BPH ?No evidence of urinary retention while inpatient, continued home tamsulosin dosing. ? ?Chronic pain ?Continued home oxycodone, gabapentin, and MS Contin, as prescribed by [facility] provider. PDMP reviewed.  ? ?HFpEF ?Monitored for increasing volume status throughout admission.  No home diuretics. ? ?Other chronic conditions were medically managed  with home medications and formulary alternatives as necessary (gout, allergic rhinitis) ? ?PCP Follow-up Recommendations:  ?Ensure patient follows up with GI for further monitoring of ulcers ?Repeat hemoglobin/CBC at PCP follow-up ?No NSAIDs  ?Resume Eliquis 4/23 ?Pantoprazole 40 mg BID x 8 weeks, then reduce to 40 mg QAM ?Consider upper endoscopy in 8 weeks to check healing. ?Follow up pathology from EGD  ?

## 2021-07-21 NOTE — H&P (Addendum)
Family Medicine Teaching Service ?Hospital Admission History and Physical ?Service Pager: [phone] ? ?Patient name: [name] Medical record number: [id number] ?Date of birth: [birth year] Age: 76 y.o. Gender: male ? ?Primary Care Provider: [facility], [facility] ?Consultants: GI ?Code Status: FULL ?Contact Information   ? ? Name Relation Home Work Mobile  ? Sukhman, Martine   [phone]  ? Kairyn, Olmeda Spouse [phone]  [phone]  ? ?  ?  ? ?Chief Complaint: melena ? ?Assessment and Plan: ?[name] is a 76 y.o. male presenting with melena. PMH is significant for gout, allergic rhinitis, Afib on Eliquis, chronic pain, BPH, and combined diastolic/systolic heart failure.  ? ?Melena  Chronic Anticoagulation  H/o macrocytic anemia  ?Melena for several days with FOBT positive in ED on chronic anticoagulation.  History of macrocytic anemia (on vit B12 at home) with significant drop in hemoglobin today compared to baseline secondary to active GI bleed.  Reassuringly, patient remains hemodynamically stable with no current bleeding.  GI was consulted by EDP, will see and likely scope the patient tomorrow.  EDP ordered Protonix 80 Mg IV bolus and then was started on gtt 8 Mg/hour.  ?-Admit to FPTS (med telemetry), attending Dr. Ardelia Mems ?-Vital signs per floor protocol ?- Continue Protonix gtt. ?- Continue IV NS 125 mL/hour ?-GI consulted to see in the morning, appreciate recommendations ?- AM BMP and CBC ?- Holding Eliquis and VTE PPx ?-Continuous cardiac monitoring ?- Continuous pulse ox ?- PT and OT evaluate and treat ?- SCDs ?- N.p.o. with sips with meds ?- Consider checking B12 and folate  ? ?AFib on Eliquis ?Active GI bleed preventing anticoagulation, holding Eliquis.  We will continue to attempt rate control with home medication of metoprolol 25 mg twice daily.  Tachycardic to 100s in the setting of acute anemia, will continue to monitor.  EKG shows A-fib, irregular in room. ?- Continue home dose  of metoprolol ?- Hold Eliquis ?- Monitor for bleeding ?- Vital signs per floor protocol ? ?HFpEF (echo performed 02/14/2021: LVEF 50 to 55% with low normal function.  No diastolic abnormalities, moderately dilated left and right atrium size, mild aortic dilation) ?No evidence of hypervolemic state on exam.  Will closely monitor fluid resuscitation in the setting of active GI bleed.  No home diuretics. ?- Continue metoprolol as above ?- Monitor volume status ?- Strict ins and outs ?- Daily weights ? ?Chronic pain ?Patient in chronic knee/back pain, receives pain regimen from the [place].  Current regimen at home: Oxycodone 5 mg every 4 as needed and MS Contin every 12 hours scheduled.  PDMP reviewed, continue home dosing. ?- Continue home oxycodone and MS Contin ?-Tylenol as needed ? ?Gout ?No recent gout flares, continue home allopurinol once med rec is complete.  Patient reports that he takes allopurinol twice daily with unknown dose.  However, home medication list shows once daily allopurinol. ?- Await med rec to restart allopurinol ? ?BPH ?On tamsulosin at home 0.4 mg daily.  Monitor output, ordered postvoid residual to prevent retention. ?- Continue home tamsulosin ?- Strict ins and outs ?- Postvoid residual ?- Add bladder scans if indicated ? ?History of alcohol use disorder ?Patient does not report any excessive alcohol use on history.  Upon chart review, he has had a history of alcohol use disorder.  Will monitor for withdrawal signs. ?- CIWA's ? ?Await med reconciliation discharge other home medications. ? ?FEN/GI: NPO, sips with meds ?Prophylaxis: SCDs in setting of acute GI bleed ? ?Disposition: Med telemetry ? ?History  of Present Illness:  [name] is a 76 y.o. male presenting with melena. PMH is significant for gout, allergic rhinitis, Afib on Eliquis, chronic pain, BPH, and combined diastolic/systolic heart failure.  ? ?Patient reports he first noticed an episode of blood in his stool 2 days ago. He  reports he has had 8 total episodes of blood in his stool since it started, overall he believes it has been slowing down. Last episode was about 4 hours ago and reports it was a small amount. He has images on his phone of dark stool with dark blood in the toilet bowl. He has been feeling weaker these past 3 days. He reports takes ibuprofen on occasion, last dose was 3 days ago. Denies abdominal pain, vomiting, hematemesis. He takes oxycodone for knee pain and back pain. Reports last colonoscopy was 3-5 years ago and was normal.  Last dose of Eliquis was last night. ? ?Review Of Systems: Per HPI with the following additions:  ? ?Review of Systems  ?Constitutional:  Negative for fever.  ?HENT:  Negative for congestion and ear pain.   ?     Rhinorrhea from allergies  ?Eyes:  Negative for discharge.  ?Respiratory:  Negative for cough and shortness of breath.   ?Cardiovascular:  Negative for palpitations, orthopnea, leg swelling and PND.  ?Gastrointestinal:  Positive for blood in stool and melena. Negative for abdominal pain and vomiting.  ?Genitourinary:  Negative for dysuria and hematuria.  ?Neurological:  Positive for weakness. Negative for focal weakness.  ? ?Patient Active Problem List  ? Diagnosis Date Noted  ? GI bleed 07/21/2021  ? Secondary hypercoagulable state ([place]) 01/28/2020  ? Brow ptosis, bilateral 09/12/2019  ? Lower extremity edema 09/09/2019  ? Breast pain 06/17/2019  ? Acquired absence of nipple, right 06/17/2019  ? Anticoagulated 10/25/2018  ? Acute on chronic combined systolic and diastolic CHF (congestive heart failure) ([place]) 10/25/2018  ? Chronic pain 10/12/2018  ? Macrocytic anemia 10/12/2018  ? Atrial fibrillation ([place]) 10/12/2018  ? Atrial fibrillation with RVR ([place]) 10/11/2018  ? Oropharyngeal dysphagia 08/14/2017  ? Sore throat 07/21/2016  ? Vasomotor rhinitis 06/23/2016  ? Zenker's diverticulum 06/14/2016  ? Chronic venous insufficiency 06/28/2015  ? Vitamin B12 deficiency 02/07/2013  ?  History of colonic polyps 09/15/2010  ? Weakness generalized 06/09/2010  ? TESTICULAR HYPOFUNCTION 01/04/2010  ? PULMONARY NODULE, LEFT LOWER LOBE 01/04/2010  ? Allergic rhinitis 06/17/2009  ? WEIGHT LOSS 03/17/2009  ? GOUT 05/04/2008  ? Osteoarthritis 05/04/2008  ? LOW BACK PAIN 05/04/2008  ? ? ?Past Medical History: ?Past Medical History:  ?Diagnosis Date  ? Atrial fibrillation with rapid ventricular response ([place]) 10/25/2018  ? Successful  DCCV shock x1 200 J 11/04/2018  ? Chronic back pain   ? buldging disc  ? Depression   ? GERD (gastroesophageal reflux disease)   ? GOUT   ? takes Allopurinol daily and Colchicine as needed  ? History of bronchitis   ? many yrs ago  ? History of colonoscopy   ? benign  ? History of shingles   ? Joint pain   ? Joint swelling   ? Muscle spasm   ? takes Robaxin as needed  ? Nocturia   ? OSTEOARTHRITIS   ? Peripheral edema   ? takes Lasix daily as needed  ? Pneumonia 1969  ? Seasonal allergies   ? takes Zyrtec and uses Flonase is needed  ? TESTICULAR HYPOFUNCTION   ? Zenker diverticula   ? ? ?Past Surgical History: ?  Past Surgical History:  ?Procedure Laterality Date  ? ANKLE SURGERY    ? 4 times  ? CAPSULOTOMY Right 08/20/2019  ? Procedure: SCAR CONTRACTURE AND CAPSULE RELEASE OF RIGHT BREAST TISSUE;  Surgeon: Wallace Going, DO;  Location: [facility];  Service: Plastics;  Laterality: Right;  ? CARDIOVERSION N/A 11/04/2018  ? Procedure: CARDIOVERSION;  Surgeon: Thayer Headings, MD;  Location: [facility] ENDOSCOPY;  Service: Cardiovascular;  Laterality: N/A;  ? COLONOSCOPY    ? ELBOW SURGERY Right   ? torn tendon  ? INGUINAL HERNIA REPAIR    ? right  ? KNEE SURGERY    ? 3 on each knee  ? SCAR REVISION Right 08/20/2019  ? Procedure: SCAR REVISION;  Surgeon: Wallace Going, DO;  Location: [facility];  Service: Plastics;  Laterality: Right;  ? SHOULDER ARTHROSCOPY Right 06/15/2015  ? Procedure: RIGHT SHOULDER ARTHROSCOPY, ACROMIOPLASTY, DEBRIDEMENT;  Surgeon:  Melrose Nakayama, MD;  Location: [facility];  Service: Orthopedics;  Laterality: Right;  ? shoulder arthrosopy Left   ? TONSILLECTOMY AND ADENOIDECTOMY    ? TUMOR EXCISION    ? sebaceous cyst removed from chest  ? Z

## 2021-07-21 NOTE — ED Triage Notes (Signed)
Pt here with reports of bloody stool X2 days. Reports approx 6 episodes over the last 2 days. Pt on eliquis. Pt denies abdominal pain.  ?

## 2021-07-21 NOTE — ED Provider Notes (Signed)
?[facility] ?Provider Note ? ? ?CSN: [id number] ?Arrival date & time: 07/21/21  1440 ? ?  ? ?History ? ?Chief Complaint  ?Patient presents with  ? Rectal Bleeding  ? ? ?[name] is a 76 y.o. male. ? ?HPI ?Patient states for the last 3 days he has had dark-colored stool with coffee-ground appearance.  He denies abdominal or back pain.  He denies weakness or dizziness.  He has chronic arthritis, and takes anti-inflammatory medicine intermittently.  There has been no syncope, chest pain, shortness of breath. ?  ? ?Home Medications ?Prior to Admission medications   ?Medication Sig Start Date End Date Taking? Authorizing Provider  ?allopurinol (ZYLOPRIM) 100 MG tablet Take 300 mg by mouth daily.    [provider]  ?apixaban (ELIQUIS) 5 MG TABS tablet Take 1 tablet (5 mg total) by mouth 2 (two) times daily. 03/04/20   Fenton, Berton Mount R, PA  ?ARTIFICIAL TEARS 0.2-0.2-1 % SOLN  12/31/18   [provider]  ?CALCIUM 600 1500 (600 Ca) MG TABS tablet Take 600 mg of elemental calcium by mouth 2 (two) times daily.  02/16/16   [provider]  ?CELEBREX 100 MG capsule Take by mouth. 03/09/21   [provider]  ?Cholecalciferol (VITAMIN D3) 125 MCG (5000 UT) TABS Take 1 tablet by mouth daily.    [provider]  ?diltiazem (CARDIZEM CD) 120 MG 24 hr capsule Take 1 capsule (120 mg total) by mouth daily as needed (IF YOUR HEART RATE GOES OVER 100). 11/28/18   Deberah Pelton, NP  ?DULoxetine (CYMBALTA) 60 MG capsule Take 1 capsule by mouth daily. 05/25/20   [provider]  ?famotidine (PEPCID) 20 MG tablet Take 20 mg by mouth 2 (two) times a day. ?Patient not taking: Reported on 01/11/2021    [provider]  ?fluticasone (FLONASE) 50 MCG/ACT nasal spray Place 2 sprays into both nostrils daily. 07/13/20   [provider]  ?furosemide (LASIX) 40 MG tablet Take 1 tablet (40 mg total) by mouth daily. Take one tablet by mouth  as needed 09/13/20   Lorretta Harp, MD  ?gabapentin (NEURONTIN) 100 MG capsule TAKE TWO CAPSULES BY MOUTH TWO TIMES A DAY FOR NEUROPATHIC PAIN 05/24/21   [provider]  ?ibuprofen (ADVIL) 600 MG tablet Take 400 mg by mouth as needed. 12/31/18   [provider]  ?indomethacin (INDOCIN) 50 MG capsule Take 50 mg by mouth as needed. 12/31/18   [provider]  ?ipratropium (ATROVENT) 0.03 % nasal spray Place 1 spray into the nose daily as needed for rhinitis.  06/23/16   [provider]  ?magnesium oxide (MAG-OX) 400 MG tablet Take 1 tablet by mouth 2 (two) times daily. 07/21/19   [provider]  ?metoprolol tartrate (LOPRESSOR) 25 MG tablet Take 1 tablet (25 mg total) by mouth 2 (two) times daily. Hold SYSTOLIC BP <767, HR <34 1/93/79   Deberah Pelton, NP  ?morphine (MS CONTIN) 15 MG 12 hr tablet Take 15 mg by mouth every 12 (twelve) hours.    [provider]  ?Multiple Vitamin (MULTIVITAMIN) tablet Take 1 tablet by mouth daily.    [provider]  ?oxyCODONE (OXY IR/ROXICODONE) 5 MG immediate release tablet Take 1 tablet (5 mg total) by mouth every 4 (four) hours as needed for severe pain. In addition to current chronic pain medication 06/15/15   Loni Dolly, PA-C  ?potassium chloride SA (KLOR-CON) 20 MEQ tablet Take 20 mEq by  mouth as needed. Along with the Lasix 07/15/19   [provider]  ?sildenafil (VIAGRA) 100 MG tablet Take 100 mg by mouth daily as needed for erectile dysfunction.    [provider]  ?tamsulosin (FLOMAX) 0.4 MG CAPS capsule Take 0.4 mg by mouth daily in the afternoon.    [provider]  ?Testosterone 20.25 MG/ACT (1.62%) GEL Apply topically. 06/05/20   [provider]  ?vitamin B-12 (CYANOCOBALAMIN) 1000 MCG tablet Take 1,000 mcg by mouth daily. 12/31/18   [provider]  ?   ? ?Allergies    ?Patient has no known allergies.   ? ?Review of Systems   ?Review of Systems ? ?Physical  Exam ?Updated Vital Signs ?BP 107/64   Pulse 96   Temp 97.7 ?F (36.5 ?C) (Oral)   Resp 12   SpO2 100%  ?Physical Exam ?Vitals and nursing note reviewed.  ?Constitutional:   ?   General: He is not in acute distress. ?   Appearance: He is well-developed. He is not ill-appearing, toxic-appearing or diaphoretic.  ?HENT:  ?   Head: Normocephalic and atraumatic.  ?   Right Ear: External ear normal.  ?   Left Ear: External ear normal.  ?Eyes:  ?   Conjunctiva/sclera: Conjunctivae normal.  ?   Pupils: Pupils are equal, round, and reactive to light.  ?Neck:  ?   Trachea: Phonation normal.  ?Cardiovascular:  ?   Rate and Rhythm: Normal rate and regular rhythm.  ?   Heart sounds: Normal heart sounds.  ?Pulmonary:  ?   Effort: Pulmonary effort is normal.  ?   Breath sounds: Normal breath sounds.  ?Abdominal:  ?   General: There is no distension.  ?   Palpations: Abdomen is soft.  ?   Tenderness: There is no abdominal tenderness.  ?Musculoskeletal:     ?   General: Normal range of motion.  ?   Cervical back: Normal range of motion and neck supple.  ?Skin: ?   General: Skin is warm and dry.  ?Neurological:  ?   Mental Status: He is alert and oriented to person, place, and time.  ?   Cranial Nerves: No cranial nerve deficit.  ?   Sensory: No sensory deficit.  ?   Motor: No abnormal muscle tone.  ?   Coordination: Coordination normal.  ?Psychiatric:     ?   Mood and Affect: Mood normal.     ?   Behavior: Behavior normal.     ?   Thought Content: Thought content normal.     ?   Judgment: Judgment normal.  ? ? ?ED Results / Procedures / Treatments   ?Labs ?(all labs ordered are listed, but only abnormal results are displayed) ?Labs Reviewed  ?COMPREHENSIVE METABOLIC PANEL - Abnormal; Notable for the following components:  ?    Result Value  ? Glucose, Bld 141 (*)   ? BUN 43 (*)   ? Albumin 3.3 (*)   ? All other components within normal limits  ?CBC - Abnormal; Notable for the following components:  ? RBC 2.76 (*)   ? Hemoglobin  9.7 (*)   ? HCT 28.8 (*)   ? MCV 104.3 (*)   ? MCH 35.1 (*)   ? All other components within normal limits  ?POC OCCULT BLOOD, ED - Abnormal; Notable for the following components:  ? Fecal Occult Bld POSITIVE (*)   ? All other components within normal limits  ?TYPE AND SCREEN  ?ABO/RH  ? ? ?  EKG ?EKG Interpretation ? ?Date/Time:  Thursday July 21 2021 18:10:04 EDT ?Ventricular Rate:  99 ?PR Interval:    ?QRS Duration: 103 ?QT Interval:  367 ?QTC Calculation: 471 ?R Axis:   34 ?Text Interpretation: Atrial fibrillation Paired ventricular premature complexes RSR' in V1 or V2, probably normal variant Borderline repolarization abnormality Since last tracing now in Atrial fibrillation Otherwise no significant change Confirmed by Daleen Bo [phone]) on 07/21/2021 6:14:32 PM ? ?Radiology ?No results found. ? ?Procedures ?Procedures  ? ? ?Medications Ordered in ED ?Medications  ?0.9 %  sodium chloride infusion ( Intravenous New Bag/Given 07/21/21 1847)  ?pantoprazole (PROTONIX) 80 mg /NS 100 mL IVPB (has no administration in time range)  ?pantoprozole (PROTONIX) 80 mg /NS 100 mL infusion (has no administration in time range)  ?pantoprazole (PROTONIX) injection 40 mg (has no administration in time range)  ?pantoprazole (PROTONIX) injection 40 mg (40 mg Intravenous Given 07/21/21 1843)  ? ? ?ED Course/ Medical Decision Making/ A&P ?  ?                        ?Medical Decision Making ?Patient was taking Xarelto for atrial fibrillation, is presenting with 3 days of suspected rectal bleeding, characterized by coffee-ground stools which are frequent.  He does feel that the stooling has diminished somewhat.  He has been not eating much because of the stooling.  He did not want to "make it bleed more." ? ?Problems Addressed: ?Anemia, unspecified type: acute illness or injury ?   Details: Decreased hemoglobin with rectal bleed ?Rectal bleeding: acute illness or injury ?   Details: Onset while taking Eliquis ? ?Amount and/or Complexity  of Data Reviewed ?Independent Historian:  ?   Details: Cogent historian ?Labs: ordered. ?   Details: CBC, metabolic panel-normal except albumin low, BUN high, hemoglobin low ?Discussion of management or test inter

## 2021-07-22 ENCOUNTER — Encounter (HOSPITAL_COMMUNITY)
Admission: EM | Disposition: A | Discharge status: Home / Self Care | Payer: Self-pay | Source: Home / Self Care | Attending: Family Medicine

## 2021-07-22 ENCOUNTER — Inpatient Hospital Stay (HOSPITAL_COMMUNITY): Payer: Medicare Other | Admitting: Anesthesiology

## 2021-07-22 ENCOUNTER — Other Ambulatory Visit: Payer: Self-pay | Admitting: Student

## 2021-07-22 ENCOUNTER — Encounter (HOSPITAL_COMMUNITY): Payer: Self-pay | Admitting: Student

## 2021-07-22 DIAGNOSIS — Z791 Long term (current) use of non-steroidal anti-inflammatories (NSAID): Secondary | ICD-10-CM | POA: Diagnosis not present

## 2021-07-22 DIAGNOSIS — K625 Hemorrhage of anus and rectum: Secondary | ICD-10-CM | POA: Diagnosis present

## 2021-07-22 DIAGNOSIS — K269 Duodenal ulcer, unspecified as acute or chronic, without hemorrhage or perforation: Secondary | ICD-10-CM | POA: Diagnosis not present

## 2021-07-22 DIAGNOSIS — E538 Deficiency of other specified B group vitamins: Secondary | ICD-10-CM | POA: Diagnosis not present

## 2021-07-22 DIAGNOSIS — I110 Hypertensive heart disease with heart failure: Secondary | ICD-10-CM | POA: Diagnosis not present

## 2021-07-22 DIAGNOSIS — R451 Restlessness and agitation: Secondary | ICD-10-CM | POA: Diagnosis not present

## 2021-07-22 DIAGNOSIS — N400 Enlarged prostate without lower urinary tract symptoms: Secondary | ICD-10-CM | POA: Diagnosis not present

## 2021-07-22 DIAGNOSIS — K264 Chronic or unspecified duodenal ulcer with hemorrhage: Secondary | ICD-10-CM | POA: Diagnosis not present

## 2021-07-22 DIAGNOSIS — D649 Anemia, unspecified: Secondary | ICD-10-CM

## 2021-07-22 DIAGNOSIS — K922 Gastrointestinal hemorrhage, unspecified: Secondary | ICD-10-CM

## 2021-07-22 DIAGNOSIS — K219 Gastro-esophageal reflux disease without esophagitis: Secondary | ICD-10-CM | POA: Diagnosis not present

## 2021-07-22 DIAGNOSIS — F32A Depression, unspecified: Secondary | ICD-10-CM | POA: Diagnosis not present

## 2021-07-22 DIAGNOSIS — J309 Allergic rhinitis, unspecified: Secondary | ICD-10-CM | POA: Diagnosis not present

## 2021-07-22 DIAGNOSIS — I4891 Unspecified atrial fibrillation: Secondary | ICD-10-CM

## 2021-07-22 DIAGNOSIS — Z5329 Procedure and treatment not carried out because of patient's decision for other reasons: Secondary | ICD-10-CM | POA: Diagnosis not present

## 2021-07-22 DIAGNOSIS — M109 Gout, unspecified: Secondary | ICD-10-CM | POA: Diagnosis not present

## 2021-07-22 DIAGNOSIS — D7589 Other specified diseases of blood and blood-forming organs: Secondary | ICD-10-CM | POA: Diagnosis not present

## 2021-07-22 DIAGNOSIS — Z79899 Other long term (current) drug therapy: Secondary | ICD-10-CM | POA: Diagnosis not present

## 2021-07-22 DIAGNOSIS — G894 Chronic pain syndrome: Secondary | ICD-10-CM | POA: Diagnosis not present

## 2021-07-22 DIAGNOSIS — K921 Melena: Secondary | ICD-10-CM | POA: Diagnosis not present

## 2021-07-22 DIAGNOSIS — D62 Acute posthemorrhagic anemia: Secondary | ICD-10-CM | POA: Diagnosis not present

## 2021-07-22 DIAGNOSIS — Z7901 Long term (current) use of anticoagulants: Secondary | ICD-10-CM | POA: Diagnosis not present

## 2021-07-22 DIAGNOSIS — I509 Heart failure, unspecified: Secondary | ICD-10-CM

## 2021-07-22 DIAGNOSIS — D6832 Hemorrhagic disorder due to extrinsic circulating anticoagulants: Secondary | ICD-10-CM | POA: Diagnosis not present

## 2021-07-22 DIAGNOSIS — T39315A Adverse effect of propionic acid derivatives, initial encounter: Secondary | ICD-10-CM | POA: Diagnosis not present

## 2021-07-22 DIAGNOSIS — Z79891 Long term (current) use of opiate analgesic: Secondary | ICD-10-CM | POA: Diagnosis not present

## 2021-07-22 DIAGNOSIS — I5042 Chronic combined systolic (congestive) and diastolic (congestive) heart failure: Secondary | ICD-10-CM | POA: Diagnosis not present

## 2021-07-22 HISTORY — PX: ESOPHAGOGASTRODUODENOSCOPY (EGD) WITH PROPOFOL: SHX5813

## 2021-07-22 HISTORY — PX: BIOPSY: SHX5522

## 2021-07-22 LAB — CBC
HCT: 21.2 % — ABNORMAL LOW (ref 39.0–52.0)
Hemoglobin: 7.1 g/dL — ABNORMAL LOW (ref 13.0–17.0)
MCH: 34.1 pg — ABNORMAL HIGH (ref 26.0–34.0)
MCHC: 33.5 g/dL (ref 30.0–36.0)
MCV: 101.9 fL — ABNORMAL HIGH (ref 80.0–100.0)
Platelets: 151 10*3/uL (ref 150–400)
RBC: 2.08 MIL/uL — ABNORMAL LOW (ref 4.22–5.81)
RDW: 13.8 % (ref 11.5–15.5)
WBC: 4.1 10*3/uL (ref 4.0–10.5)
nRBC: 0 % (ref 0.0–0.2)

## 2021-07-22 LAB — BASIC METABOLIC PANEL
Anion gap: 6 (ref 5–15)
BUN: 30 mg/dL — ABNORMAL HIGH (ref 8–23)
CO2: 26 mmol/L (ref 22–32)
Calcium: 8.3 mg/dL — ABNORMAL LOW (ref 8.9–10.3)
Chloride: 108 mmol/L (ref 98–111)
Creatinine, Ser: 0.75 mg/dL (ref 0.61–1.24)
GFR, Estimated: >60 mL/min (ref >60)
Glucose, Bld: 104 mg/dL — ABNORMAL HIGH (ref 70–99)
Potassium: 3.7 mmol/L (ref 3.5–5.1)
Sodium: 140 mmol/L (ref 135–145)

## 2021-07-22 LAB — HEMOGLOBIN AND HEMATOCRIT, BLOOD
HCT: 20.8 % — ABNORMAL LOW (ref 39.0–52.0)
HCT: 21.5 % — ABNORMAL LOW (ref 39.0–52.0)
Hemoglobin: 7.3 g/dL — ABNORMAL LOW (ref 13.0–17.0)
Hemoglobin: 7.3 g/dL — ABNORMAL LOW (ref 13.0–17.0)

## 2021-07-22 LAB — APTT: aPTT: 27 seconds (ref 24–36)

## 2021-07-22 LAB — PROTIME-INR
INR: 1.2 (ref 0.8–1.2)
Prothrombin Time: 15.4 seconds — ABNORMAL HIGH (ref 11.4–15.2)

## 2021-07-22 LAB — PREPARE RBC (CROSSMATCH)

## 2021-07-22 LAB — MAGNESIUM: Magnesium: 1.4 mg/dL — ABNORMAL LOW (ref 1.7–2.4)

## 2021-07-22 SURGERY — ESOPHAGOGASTRODUODENOSCOPY (EGD) WITH PROPOFOL
Anesthesia: Monitor Anesthesia Care

## 2021-07-22 MED ORDER — APIXABAN 5 MG PO TABS: 5.0000 mg | ORAL_TABLET | Freq: Two times a day (BID) | ORAL | 6 refills | Status: AC

## 2021-07-22 MED ORDER — OXYCODONE HCL 5 MG PO TABS: 10.0000 mg | ORAL_TABLET | ORAL | Status: DC

## 2021-07-22 MED ORDER — OXYCODONE HCL 5 MG PO TABS
10.0000 mg | ORAL_TABLET | ORAL | Status: DC | PRN
  Administered 2021-07-22 (×3): 10 mg via ORAL
  Filled 2021-07-22 (×3): qty 2

## 2021-07-22 MED ORDER — PROPOFOL 500 MG/50ML IV EMUL
INTRAVENOUS | Status: DC | PRN
  Administered 2021-07-22: 150 ug/kg/min via INTRAVENOUS

## 2021-07-22 MED ORDER — SODIUM CHLORIDE 0.9% IV SOLUTION: Freq: Once | INTRAVENOUS | Status: DC

## 2021-07-22 MED ORDER — PHENYLEPHRINE 80 MCG/ML (10ML) SYRINGE FOR IV PUSH (FOR BLOOD PRESSURE SUPPORT)
PREFILLED_SYRINGE | INTRAVENOUS | Status: DC | PRN
Start: 2021-07-22 — End: 2021-07-22
  Administered 2021-07-22 (×2): 80 ug via INTRAVENOUS

## 2021-07-22 MED ORDER — OXYCODONE HCL 5 MG PO TABS: 10.0000 mg | ORAL_TABLET | ORAL | Status: DC | PRN

## 2021-07-22 MED ORDER — MAGNESIUM SULFATE 2 GM/50ML IV SOLN
2.0000 g | Freq: Once | INTRAVENOUS | Status: AC
  Administered 2021-07-22: 2 g via INTRAVENOUS
  Filled 2021-07-22: qty 50

## 2021-07-22 MED ORDER — OXYCODONE HCL 5 MG PO TABS: 10.0000 mg | ORAL_TABLET | Freq: Once | ORAL | Status: DC

## 2021-07-22 MED ORDER — GABAPENTIN 100 MG PO CAPS
200.0000 mg | ORAL_CAPSULE | Freq: Two times a day (BID) | ORAL | Status: DC
  Administered 2021-07-22: 200 mg via ORAL
  Filled 2021-07-22: qty 2

## 2021-07-22 MED ORDER — PROPOFOL 10 MG/ML IV BOLUS
INTRAVENOUS | Status: DC | PRN
  Administered 2021-07-22: 20 mg via INTRAVENOUS

## 2021-07-22 MED ORDER — LIDOCAINE 2% (20 MG/ML) 5 ML SYRINGE
INTRAMUSCULAR | Status: DC | PRN
  Administered 2021-07-22: 100 mg via INTRAVENOUS

## 2021-07-22 MED ORDER — PANTOPRAZOLE SODIUM 40 MG PO TBEC: 40.0000 mg | DELAYED_RELEASE_TABLET | Freq: Two times a day (BID) | ORAL | 0 refills | Status: DC

## 2021-07-22 SURGICAL SUPPLY — 15 items

## 2021-07-22 NOTE — Progress Notes (Addendum)
Family Medicine Teaching Service ?Daily Progress Note ?Intern Pager: [phone] ? ?Patient name: [name] Medical record number: [id number] ?Date of birth: June 11, 1945 Age: 76 y.o. Gender: male ? ?Primary Care Provider: Administration, [facility] ?Consultants: GI ?Code Status: full ? ?Pt Overview and Major Events to Date:  ?4/20 admit ? ?Assessment and Plan: ?[name] is a 76 y.o. male presenting with melena. PMH is significant for gout, allergic rhinitis, Afib on Eliquis, chronic pain, BPH, and combined diastolic/systolic heart failure.  ? ?Suspected upper GI bleed ?Chronic Anticoagulation ?H/o macrocytic anemia  ?Positive FOBT, melena for several days.  Hemoglobin stable 7.1>7.3. INR 1.2.  VSS on room air. ?-GI consulted, greatly appreciate recommendations ?-Continue protonix gtt ?-Continue IV NS 125 ml/hr ?-AM BMP and CBC ?-Holding eliquis and pharmacologic VTE prophylaxis ?-Continuous cardiac monitoring and pulse ox ?-N.p.o. with sips with meds ? ?AFib on Eliquis ?-Holding Eliquis ?-Continue metoprolol ? ?HFpEF, stable ?Does not appear volume overloaded. ?-Continue to monitor volume status ? ?Chronic pain ?Has chronic knee/back pain for past 20 years with pain regimen from [place].  Requires oxycodone 5 mg every 4 hours and MS Contin every 12 hours.  Also takes gabapentin. ?-Continue home meds ?-Tylenol as needed ? ?Gout ?No recent gout flares, continue home allopurinol once med rec has been completed. ?-Restart allopurinol once med rec complete ? ?BPH ?On flomax at home ?-Continue home flomax ?-Strict I/O ? ? ?FEN/GI: NPO, sip with meds ?PPx: SCD ?Dispo:Home tomorrow. Barriers include ongoing medical workup.  ? ?Subjective:  ?Patient very agitated due to not having his pain meds exactly as scheduled.  No other acute concerns at this time.  Amenable to having gastroenterologist see him today.  Any abdominal pain ? ?Objective: ?Temp:  [97.7 ?F (36.5 ?C)-98.2 ?F (36.8 ?C)] 97.9 ?F (36.6 ?C) (04/21 0755) ?Pulse  Rate:  [85-119] 100 (04/21 0755) ?Resp:  [12-29] 17 (04/21 0755) ?BP: (104-143)/(58-92) 110/92 (04/21 0755) ?SpO2:  [98 %-100 %] 98 % (04/21 0755) ?Physical Exam: ?General: Resting comfortably, NAD ?Cardiovascular: RRR ?Respiratory: CTA B ?Abdomen: Soft nontender ?Extremities: Warm, dry ? ?Laboratory: ?Recent Labs  ?Lab 07/21/21 ?1455 07/22/21 ?0151 07/22/21 ?0809  ?WBC 6.5 4.1  --   ?HGB 9.7* 7.1* 7.3*  ?HCT 28.8* 21.2* 20.8*  ?PLT 201 151  --   ? ?Recent Labs  ?Lab 07/21/21 ?1455 07/22/21 ?0151  ?NA 138 140  ?K 4.1 3.7  ?CL 104 108  ?CO2 25 26  ?BUN 43* 30*  ?CREATININE 0.77 0.75  ?CALCIUM 9.0 8.3*  ?PROT 7.1  --   ?BILITOT 1.0  --   ?ALKPHOS 94  --   ?ALT 21  --   ?AST 25  --   ?GLUCOSE 141* 104*  ? ? ? ? ?Imaging/Diagnostic Tests: ?No results found. ? ?France Ravens, MD ?07/22/2021, 8:32 AM ?PGY-1, [place] [facility] ?[place] Intern pager: [phone], text pages welcome ?

## 2021-07-22 NOTE — Anesthesia Procedure Notes (Addendum)
Procedure Name: [place] ?Date/Time: 07/22/2021 2:51 PM ?Performed by: Erick Colace, CRNA ?Pre-anesthesia Checklist: Patient identified, Emergency Drugs available, Suction available and Patient being monitored ?Patient Re-evaluated:Patient Re-evaluated prior to induction ?Oxygen Delivery Method: Nasal cannula ?Preoxygenation: Pre-oxygenation with 100% oxygen ?Induction Type: IV induction ?Airway Equipment and Method: Bite block ?Dental Injury: Teeth and Oropharynx as per pre-operative assessment  ? ? ? ? ?

## 2021-07-22 NOTE — Evaluation (Addendum)
Physical Therapy Evaluation ?Patient Details ?Name: [name] ?MRN: [id number] ?DOB: 17-Mar-1946 ?Today's Date: 07/22/2021 ? ?History of Present Illness ? 76 yo M presents to ED 4/20 with melena. Hemoglobin most recently 11.5 [facility]02[facility],  9.7 on presentation to the ED. EGD ordered.  PMH: recent L arm biceps tendon rupture, afib on eliquis, chronic pain, depression, GERD, gout, zenker's diverticulum s/p repair, pseudogout, hypogonadism, and ?MGUS  ?Clinical Impression ? PTA pt living with wife and son in single story home with 1 step to enter. Pt reports independence in mobility, occasionally using a cane on uneven surfaces when he is fishing. Independent in ADLs, and iADLs. Pt is currently very close to his baseline level of function and is mod I for bed mobility, transfers, and ambulation without AD, supervision for stair training. Pt will not have any equipment or therapy needs at discharge, however PT will keep on caseload in the unlikely event he is admitted to the hospital to work on higher level balance activities.    ?   ? ?Recommendations for follow up therapy are one component of a multi-disciplinary discharge planning process, led by the attending physician.  Recommendations may be updated based on patient status, additional functional criteria and insurance authorization. ? ?Follow Up Recommendations No PT follow up ? ?  ?Assistance Recommended at Discharge PRN  ?   ?Equipment Recommendations None recommended by PT  ?   ?Functional Status Assessment Patient has not had a recent decline in their functional status  ? ?  ?Precautions / Restrictions    ? ?  ? ?Mobility ? Bed Mobility ?Overal bed mobility: Modified Independent ?  ?  ?  ?  ?  ?  ?General bed mobility comments: use of bed rail to come to seated ?  ? ?Transfers ?Overall transfer level: Independent ?Equipment used: None ?  ?  ?  ?  ?  ?  ?  ?General transfer comment: slow but effective power up and self steadying in standing ?   ? ?Ambulation/Gait ?Ambulation/Gait assistance: Modified independent (Device/Increase time) ?Gait Distance (Feet): 200 Feet ?Assistive device: None ?Gait Pattern/deviations: Step-through pattern, Decreased step length - right, Decreased step length - left, Shuffle, Narrow base of support ?Gait velocity: WFL ?Gait velocity interpretation: >2.62 ft/sec, indicative of community ambulatory ?  ?General Gait Details: slow, mildly unsteady gait, with occassional scissor step due to decreased BoS however no overt LoB, decreased knee flexion ? ?Stairs ?Stairs: Yes ?Stairs assistance: Supervision ?Stair Management: One rail Right ?Number of Stairs: 1 (x2) ?General stair comments: pt able to step up and back from one step x2 with use of handrail but no outside support ? ? ?  ? ?Balance Overall balance assessment: Mild deficits observed, not formally tested ?  ?  ?  ?  ?  ?  ?  ?  ?  ?  ?  ?  ?  ?  ?  ?  ?  ?  ?   ? ? ? ?Pertinent Vitals/Pain Pain Assessment ?Pain Assessment: 0-10 ?Pain Score: 4  ?Pain Location: back and bilateral knees ?Pain Descriptors / Indicators: Aching, Sore ?Pain Intervention(s): Limited activity within patient's tolerance, Monitored during session, Repositioned  ? ? ?Home Living Family/patient expects to be discharged to:: Private residence ?Living Arrangements: Spouse/significant other;Children ?Available Help at Discharge: Family;Available PRN/intermittently ?Type of Home: House ?Home Access: Stairs to enter ?  ?Entrance Stairs-Number of Steps: 1 ?  ?Home Layout: One level ?Home Equipment: Kasandra Knudsen - single point ?   ?  ?  Prior Function Prior Level of Function : Independent/Modified Independent ?  ?  ?  ?  ?  ?  ?Mobility Comments: uses cane sometimes on uneven surfaces outside, avid fisherman and hunter ?ADLs Comments: independent in ADLs and iADLs ?  ? ? ?   ?Extremity/Trunk Assessment  ? Upper Extremity Assessment ?Upper Extremity Assessment: LUE deficits/detail ?LUE Deficits / Details: recent L biceps  tendon rupture ?LUE Sensation: WNL ?LUE Coordination: WNL ?  ? ?Lower Extremity Assessment ?Lower Extremity Assessment: RLE deficits/detail;LLE deficits/detail ?RLE Deficits / Details: knee surgery x3, flexion limited to ~90 degrees, hip and ankle ROM WFL ?RLE Sensation: WNL ?RLE Coordination: WNL ?LLE Deficits / Details: knee surgery x3, flexion limited to ~90 degrees, hip and ankle ROM WFL ?LLE Sensation: WNL ?LLE Coordination: WNL ?  ? ?Cervical / Trunk Assessment ?Cervical / Trunk Assessment: Other exceptions (chronic low back pain)  ?Communication  ? Communication: No difficulties  ?Cognition Arousal/Alertness: Awake/alert ?Behavior During Therapy: [facility] for tasks assessed/performed ?Overall Cognitive Status: Within Functional Limits for tasks assessed ?  ?  ?  ?  ?  ?  ?  ?  ?  ?  ?  ?  ?  ?  ?  ?  ?  ?  ?  ? ?  ?General Comments General comments (skin integrity, edema, etc.): VSS on RA ? ?  ?   ? ?Assessment/Plan  ?  ?PT Assessment Patient does not need any further PT services  ?   ?   ? ?PT Goals (Current goals can be found in the Care Plan section)  ?Acute Rehab PT Goals ?Patient Stated Goal: go home as soon as possible ?PT Goal Formulation: With patient ?Time For Goal Achievement: 08/05/21 ?Potential to Achieve Goals: Good ? ?  ? ?AM-PAC PT "6 Clicks" Mobility  ?Outcome Measure Help needed turning from your back to your side while in a flat bed without using bedrails?: None ?Help needed moving from lying on your back to sitting on the side of a flat bed without using bedrails?: None ?Help needed moving to and from a bed to a chair (including a wheelchair)?: None ?Help needed standing up from a chair using your arms (e.g., wheelchair or bedside chair)?: None ?Help needed to walk in hospital room?: None ?Help needed climbing 3-5 steps with a railing? : None ?6 Click Score: 24 ? ?  ?End of Session Equipment Utilized During Treatment: Gait belt ?Activity Tolerance: Patient tolerated treatment well ?Patient left:  in chair;with call bell/phone within reach ?Nurse Communication: Mobility status ?PT Visit Diagnosis: Unsteadiness on feet (R26.81);Other abnormalities of gait and mobility (R26.89) ?  ? ?Time: 5916-3846 ?PT Time Calculation (min) (ACUTE ONLY): 37 min ? ? ?Charges:   PT Evaluation ?$PT Eval Low Complexity: 1 Low ?PT Treatments ?$Therapeutic Activity: 8-22 mins ?  ?   ? ? ?Namita Yearwood B. Migdalia Dk PT, DPT ?Acute Rehabilitation Services ?Pager [phone] ?Office [phone] ? ? ?[place] ?07/22/2021, 11:10 AM ? ?

## 2021-07-22 NOTE — Consult Note (Signed)
? ? ?Consultation ? ?Referring Provider: Family medicine/ [place] ?Primary Care Physician:  [facility], [facility] ?Primary Gastroenterologist: Dr. Fuller Plan ? ?Reason for Consultation: GI bleed ? ?HPI: [name] is a 76 y.o. male, who was admitted last night through the emergency room.  He presented with 3-day history of melenic stools, in setting of chronic Eliquis use.  He has been hemodynamically stable. ?He denies any abdominal pain, no nausea or vomiting, no dysphagia or odynophagia.  He had been having 2-3 bowel movements per day over the past 3 days, all dark/black with red tinge in commode. ?Has not had any prior history of GI bleeding.  He took his last dose of Eliquis on Tuesday evening stopped it himself because of the bleeding.  No aspirin use.  He does have a prescription for 600 mg ibuprofen which she takes intermittently but not on a regular basis. ?Patient has not had prior EGD. ?BUN 30/creatinine 0.75. ? ?He is known to Dr. Fuller Plan from colonoscopy done in 2012 and says that he had seen Dr. Lyla Son prior to that.  Colonoscopy June 2012 with removal of 3 polyps, 2 were tubular adenomas, also noted internal hemorrhoids. ? ?On arrival yesterday WBC 6.5/hemoglobin 9.7/hematocrit 28.8/MCV 104/platelets 201 ?Calcium 4.1/BUN 43/creatinine 0.77 LFTs within normal limits ?Hemoglobin had dropped to 7.1, and this a.m. 7.3 ? ?Primary service was going to transfuse 1 unit of packed RBCs but patient wishes to hold at this time. ?He has history of atrial fibrillation for which she been on the Eliquis, chronic pain syndrome with chronic opioid use, BPH and congestive heart failure. ? ? ?Past Medical History:  ?Diagnosis Date  ? Atrial fibrillation with rapid ventricular response ([place]) 10/25/2018  ? Successful  DCCV shock x1 200 J 11/04/2018  ? Chronic back pain   ? buldging disc  ? Depression   ? GERD (gastroesophageal reflux disease)   ? GOUT   ? takes Allopurinol daily and Colchicine as needed  ? History  of bronchitis   ? many yrs ago  ? History of colonoscopy   ? benign  ? History of shingles   ? Joint pain   ? Joint swelling   ? Muscle spasm   ? takes Robaxin as needed  ? Nocturia   ? OSTEOARTHRITIS   ? Peripheral edema   ? takes Lasix daily as needed  ? Pneumonia 1969  ? Seasonal allergies   ? takes Zyrtec and uses Flonase is needed  ? TESTICULAR HYPOFUNCTION   ? Zenker diverticula   ? ? ?Past Surgical History:  ?Procedure Laterality Date  ? ANKLE SURGERY    ? 4 times  ? CAPSULOTOMY Right 08/20/2019  ? Procedure: SCAR CONTRACTURE AND CAPSULE RELEASE OF RIGHT BREAST TISSUE;  Surgeon: Wallace Going, DO;  Location: [place];  Service: Plastics;  Laterality: Right;  ? CARDIOVERSION N/A 11/04/2018  ? Procedure: CARDIOVERSION;  Surgeon: Thayer Headings, MD;  Location[facility] END[facility]COPY;  Service: Cardiovascular;  Laterality: N/A;  ? COLONOSCOPY    ? ELBOW SURGERY Right   ? torn tendon  ? INGUINAL HERNIA REPAIR    ? right  ? KNEE SURGERY    ? 3 on each knee  ? SCAR REVISION Right 08/20/2019  ? Procedure: SCAR REVISION;  Surgeon: Wallace Going, DO;  Location: [place];  Service: Plastics;  Laterality: Right;  ? SHOULDER ARTHROSCOPY Right 06/15/2015  ? Procedure: RIGHT SHOULDER ARTHROSCOPY, ACROMIOPLASTY, DEBRIDEMENT;  Surgeon: Melrose Nakayama, MD;  Location: [place];  Service: Orthopedics;  Laterality: Right;  ? shoulder arthrosopy Left   ? TONSILLECTOMY AND ADENOIDECTOMY    ? TUMOR EXCISION    ? sebaceous cyst removed from chest  ? ZENKER'S DIVERTICULECTOMY ENDOSCOPIC N/A 06/14/2016  ? Procedure: IWOEHO'Z DIVERTICULECTOMY ENDOSCOPIC;  Surgeon: Izora Gala, MD;  Location: [place];  Service: ENT;  Laterality: N/A;  ? ? ?Prior to Admission medications   ?Medication Sig Start Date End Date Taking? Authorizing Provider  ?oxyCODONE (OXY IR/ROXICODONE) 5 MG immediate release tablet Take 1 tablet (5 mg total) by mouth every 4 (four) hours as needed for severe pain. In addition to current chronic  pain medication ?Patient taking differently: Take 10 mg by mouth every 3 (three) hours as needed for severe pain. In addition to current chronic pain medication 06/15/15  Yes Loni Dolly, PA-C  ?allopurinol (ZYLOPRIM) 100 MG tablet Take 300 mg by mouth daily.    [provider]  ?apixaban (ELIQUIS) 5 MG TABS tablet Take 1 tablet (5 mg total) by mouth 2 (two) times daily. 03/04/20   Fenton, Berton Mount R, PA  ?ARTIFICIAL TEARS 0.2-0.2-1 % SOLN  12/31/18   [provider]  ?CALCIUM 600 1500 (600 Ca) MG TABS tablet Take 600 mg of elemental calcium by mouth 2 (two) times daily.  02/16/16   [provider]  ?CELEBREX 100 MG capsule Take by mouth. 03/09/21   [provider]  ?Cholecalciferol (VITAMIN D3) 125 MCG (5000 UT) TABS Take 1 tablet by mouth daily.    [provider]  ?diltiazem (CARDIZEM CD) 120 MG 24 hr capsule Take 1 capsule (120 mg total) by mouth daily as needed (IF YOUR HEART RATE GOES OVER 100). 11/28/18   Deberah Pelton, NP  ?DULoxetine (CYMBALTA) 60 MG capsule Take 1 capsule by mouth daily. 05/25/20   [provider]  ?famotidine (PEPCID) 20 MG tablet Take 20 mg by mouth 2 (two) times a day. ?Patient not taking: Reported on 01/11/2021    [provider]  ?fluticasone (FLONASE) 50 MCG/ACT nasal spray Place 2 sprays into both nostrils daily. 07/13/20   [provider]  ?furosemide (LASIX) 40 MG tablet Take 1 tablet (40 mg total) by mouth daily. Take one tablet by mouth as needed 09/13/20   Lorretta Harp, MD  ?gabapentin (NEURONTIN) 100 MG capsule TAKE TWO CAPSULES BY MOUTH TWO TIMES A DAY FOR NEUROPATHIC PAIN 05/24/21   [provider]  ?ibuprofen (ADVIL) 600 MG tablet Take 400 mg by mouth as needed. 12/31/18   [provider]  ?indomethacin (INDOCIN) 50 MG capsule Take 50 mg by mouth as needed. 12/31/18   [provider]  ?ipratropium (ATROVENT) 0.03 % nasal spray Place 1 spray into the nose daily as needed for  rhinitis.  06/23/16   [provider]  ?magnesium oxide (MAG-OX) 400 MG tablet Take 1 tablet by mouth 2 (two) times daily. 07/21/19   [provider]  ?metoprolol tartrate (LOPRESSOR) 25 MG tablet Take 1 tablet (25 mg total) by mouth 2 (two) times daily. Hold SYSTOLIC BP <224, HR <82 5/00/37   Deberah Pelton, NP  ?morphine (MS CONTIN) 15 MG 12 hr tablet Take 15 mg by mouth every 12 (twelve) hours.    [provider]  ?Multiple Vitamin (MULTIVITAMIN) tablet Take 1 tablet by mouth daily.    [provider]  ?potassium chloride SA (KLOR-CON) 20 MEQ tablet Take 20 mEq by mouth as needed. Along with the Lasix 07/15/19   [provider]  ?sildenafil (  VIAGRA) 100 MG tablet Take 100 mg by mouth daily as needed for erectile dysfunction.    [provider]  ?tamsulosin (FLOMAX) 0.4 MG CAPS capsule Take 0.4 mg by mouth daily in the afternoon.    [provider]  ?Testosterone 20.25 MG/ACT (1.62%) GEL Apply topically. 06/05/20   [provider]  ?vitamin B-12 (CYANOCOBALAMIN) 1000 MCG tablet Take 1,000 mcg by mouth daily. 12/31/18   [provider]  ? ? ?Current Facility-Administered Medications  ?Medication Dose Route Frequency Provider Last Rate Last Admin  ? 0.9 %  sodium chloride infusion (Manually program via Guardrails IV Fluids)   Intravenous Once Erskine Emery, MD      ? 0.9 %  sodium chloride infusion   Intravenous Continuous Daleen Bo, MD 125 mL/hr at 07/22/21 0246 New Bag at 07/22/21 0246  ? acetaminophen (TYLENOL) tablet 650 mg  650 mg Oral Q6H PRN Erskine Emery, MD      ? gabapentin (NEURONTIN) capsule 200 mg  200 mg Oral BID Erskine Emery, MD   200 mg at 07/22/21 5093  ? metoprolol tartrate (LOPRESSOR) tablet 25 mg  25 mg Oral BID Erskine Emery, MD   25 mg at 07/21/21 2224  ? morphine (MS CONTIN) 12 hr tablet 15 mg  15 mg Oral Q12H Erskine Emery, MD   15 mg at 07/21/21 2335  ? oxyCODONE (Oxy IR/ROXICODONE) immediate release  tablet 10 mg  10 mg Oral Q4H PRN Erskine Emery, MD   10 mg at 07/22/21 0738  ? [START ON 07/25/2021] pantoprazole (PROTONIX) injection 40 mg  40 mg Intravenous Q12H Daleen Bo, MD      ? pantoprozole (PROTON

## 2021-07-22 NOTE — Anesthesia Preprocedure Evaluation (Signed)
Anesthesia Evaluation  ?Patient identified by MRN, date of birth, ID band ?Patient awake ? ? ? ?Reviewed: ?Allergy & Precautions, NPO status , Patient's Chart, lab work & pertinent test results ? ?Airway ?Mallampati: II ? ?TM Distance: >3 FB ?Neck ROM: Full ? ? ? Dental ?no notable dental hx. ? ?  ?Pulmonary ?neg pulmonary ROS,  ?  ?Pulmonary exam normal ? ? ? ? ? ? ? Cardiovascular ?hypertension, Pt. on medications and Pt. on home beta blockers ?+CHF  ?+ dysrhythmias (on Eliquis) Atrial Fibrillation  ?Rhythm:Irregular Rate:Normal ? ? ?  ?Neuro/Psych ?Depression negative neurological ROS ?   ? GI/Hepatic ?Neg liver ROS, GERD  ,GIB ?  ?Endo/Other  ?negative endocrine ROS ? Renal/GU ?negative Renal ROS  ?negative genitourinary ?  ?Musculoskeletal ? ?(+) Arthritis , Osteoarthritis,   ? Abdominal ?Normal abdominal exam  (+)   ?Peds ? Hematology ? ?(+) Blood dyscrasia, anemia ,   ?Anesthesia Other Findings ? ? Reproductive/Obstetrics ? ?  ? ? ? ? ? ? ? ? ? ? ? ? ? ?  ?  ? ? ? ? ? ? ? ? ?Anesthesia Physical ?Anesthesia Plan ? ?ASA: 3 ? ?Anesthesia Plan: MAC  ? ?Post-op Pain Management:   ? ?Induction: Intravenous ? ?PONV Risk Score and Plan: 1 and Propofol infusion and Treatment may vary due to age or medical condition ? ?Airway Management Planned: Simple Face Mask, Natural Airway and Nasal Cannula ? ?Additional Equipment:  ? ?Intra-op Plan:  ? ?Post-operative Plan:  ? ?Informed Consent: I have reviewed the patients History and Physical, chart, labs and discussed the procedure including the risks, benefits and alternatives for the proposed anesthesia with the patient or authorized representative who has indicated his/her understanding and acceptance.  ? ? ? ?Dental advisory given ? ?Plan Discussed with: CRNA ? ?Anesthesia Plan Comments: (Lab Results ?     Component                Value               Date                 ?     WBC                      4.1                 07/22/2021            ?     HGB                      7.3 (L)             07/22/2021           ?     HCT                      20.8 (L)            07/22/2021           ?     MCV                      101.9 (H)           07/22/2021           ?     PLT  151                 07/22/2021           ?Lab Results ?     Component                Value               Date                 ?     NA                       140                 07/22/2021           ?     K                        3.7                 07/22/2021           ?     CO2                      26                  07/22/2021           ?     GLUCOSE                  104 (H)             07/22/2021           ?     BUN                      30 (H)              07/22/2021           ?     CREATININE               0.75                07/22/2021           ?     CALCIUM                  8.3 (L)             07/22/2021           ?     EGFR                     >90                 01/14/2015           ?     GFRNONAA                 >60                 07/22/2021          )  ? ? ? ? ? ? ?Anesthesia Quick Evaluation ? ?

## 2021-07-22 NOTE — Plan of Care (Signed)
?  Problem: Education: ?Goal: Knowledge of General Education information will improve ?Description: Including pain rating scale, medication(s)/side effects and non-pharmacologic comfort measures ?07/22/2021 2040 by Sharlett Iles, RN ?Outcome: Adequate for Discharge ?07/22/2021 2040 by Sharlett Iles, RN ?Outcome: Adequate for Discharge ?07/22/2021 1928 by Sharlett Iles, RN ?Outcome: Adequate for Discharge ?  ?Problem: Health Behavior/Discharge Planning: ?Goal: Ability to manage health-related needs will improve ?07/22/2021 2040 by Sharlett Iles, RN ?Outcome: Adequate for Discharge ?07/22/2021 2040 by Sharlett Iles, RN ?Outcome: Adequate for Discharge ?07/22/2021 1928 by Sharlett Iles, RN ?Outcome: Adequate for Discharge ?  ?Problem: Clinical Measurements: ?Goal: Diagnostic test results will improve ?07/22/2021 2040 by Sharlett Iles, RN ?Outcome: Adequate for Discharge ?  ?Problem: Pain Managment: ?Goal: General experience of comfort will improve ?07/22/2021 2040 by Sharlett Iles, RN ?Outcome: Adequate for Discharge ?07/22/2021 2040 by Sharlett Iles, RN ?Outcome: Adequate for Discharge ?07/22/2021 1928 by Sharlett Iles, RN ?Outcome: Adequate for Discharge ?  ?

## 2021-07-22 NOTE — Anesthesia Postprocedure Evaluation (Signed)
Anesthesia Post Note ? ?Patient: [name] ? ?Procedure(s) Performed: ESOPHAGOGASTRODUODENOSCOPY (EGD) WITH PROPOFOL ?BIOPSY ? ?  ? ?Patient location during evaluation: Endoscopy ?Anesthesia Type: MAC ?Level of consciousness: awake and alert ?Pain management: pain level controlled ?Vital Signs Assessment: post-procedure vital signs reviewed and stable ?Respiratory status: spontaneous breathing, nonlabored ventilation, respiratory function stable and patient connected to nasal cannula oxygen ?Cardiovascular status: stable and blood pressure returned to baseline ?Postop Assessment: no apparent nausea or vomiting ?Anesthetic complications: no ? ? ?No notable events documented. ? ?Last Vitals:  ?Vitals:  ? 07/22/21 1517 07/22/21 1527  ?BP: (!) 101/53 (!) 110/50  ?Pulse: 65 65  ?Resp: 14 17  ?Temp:    ?SpO2: 100% 100%  ?  ?Last Pain:  ?Vitals:  ? 07/22/21 1527  ?TempSrc:   ?PainSc: 0-No pain  ? ? ?  ?  ?  ?  ?  ?  ? ?[name] ? ? ? ? ?

## 2021-07-22 NOTE — Discharge Summary (Signed)
Family Medicine Teaching Service ?Hospital Discharge Summary ? ?Patient name: [name] Medical record number: [id number] ?Date of birth: [birth year] Age: 76 y.o. Gender: male ?Date of Admission: 07/21/2021  Date of Discharge: 07/22/21 ?Admitting Physician: Erskine Emery, MD ? ?Primary Care Provider: [facility] ?Consultants: GI  ? ?Indication for Hospitalization: Melena  ? ?Discharge Diagnoses/Problem List:  ?Principal Problem: ?  GI bleed ?Active Problems: ?  GOUT ?  Allergic rhinitis ?  Vitamin B12 deficiency ?  Atrial fibrillation with RVR ([place]) ?  Atrial fibrillation ([place]) ?  Anticoagulated ?  Melena ?  Duodenal ulcer ?  Chronic pain  ?   ? ? ?Disposition: Home  ? ?Discharge Condition: Stable  ? ?Discharge Exam: Blood pressure 110/73, pulse 66, temperature (!) 97.4 ?F (36.3 ?C), temperature source Oral, resp. rate 17, height '6\' 1"'$  (1.854 m), weight 84.6 kg, SpO2 100 %. ?Physical Exam: ?General: Resting comfortably, NAD ?Cardiovascular: RRR ?Respiratory: CTA B ?Abdomen: Soft nontender ?Extremities: Warm, dry ?  ? ?Brief Hospital Course:  ?[name] is a 76 y.o.male with a history of combined diastolic/systolic heart failure, BPH, macrocytic anemia, history of alcohol use disorder, allergic rhinitis, gout who was admitted to the family medicine teaching Service at [facility] for melena with acute GI bleed. His hospital course is detailed below: ? ?Upper GI bleed ?Patient presented with several days of melena while on Eliquis for A-fib, FOBT positive in ED.  Initially anemic to 9.7 and overnight dropped to 7.1.  Patient declined transfusion and hemoglobin was trended stable.  GI was consulted and patient had EGD on 4/21 that showed nonbleeding duodenal ulcer with no stigmata of bleeding, likely due to ibuprofen.  While in the hospital, Eliquis was held with recommendations to continue to hold for 2 days (to resume on 4/23).  Patient continued to decline transfusion as the hemoglobin was not  decreasing, it was 7.3 upon discharge.  Protonix BID '40mg'$  x8 weeks then transition to once daily per GI. Patient recommended to follow-up with his regular GI in [place] for follow-up. Await pathology results from upper endoscope. Consider upper endoscopy in 8 weeks to check healing. ?  ? ?History of macrocytic anemia history of alcohol use disorder ?CIWA protocol was initiated upon admission and were largely negative.  Patient did not require any medications and had no signs of alcohol withdrawal.  Labs significant for macrocytosis, home B12 was continued.  ? ?A-fib ?Rate controlled with metoprolol during admission.  Home Eliquis was held due to GI bleed and instructed to resume 2 days after EGD procedure on 4/23. ? ?BPH ?No evidence of urinary retention while inpatient, continued home tamsulosin dosing. ? ?Chronic pain ?Continued home oxycodone, gabapentin, and MS Contin, as prescribed by [facility] provider. PDMP reviewed.  ? ?HFpEF ?Monitored for increasing volume status throughout admission.  No home diuretics. ? ?Other chronic conditions were medically managed with home medications and formulary alternatives as necessary (gout, allergic rhinitis) ? ?PCP Follow-up Recommendations:  ?Ensure patient follows up with GI for further monitoring of ulcers ?Repeat hemoglobin/CBC at PCP follow-up ?No NSAIDs  ?Resume Eliquis 4/23 ?Pantoprazole 40 mg BID x 8 weeks, then reduce to 40 mg QAM ?Consider upper endoscopy in 8 weeks to check healing. ?Follow up pathology from EGD  ? ? ?Significant Labs and Imaging:  ?Recent Labs  ?Lab 07/21/21 ?1455 07/22/21 ?0151 07/22/21 ?0809 07/22/21 ?1830  ?WBC 6.5 4.1  --   --   ?HGB 9.7* 7.1* 7.3* 7.3*  ?HCT 28.8* 21.2* 20.8* 21.5*  ?PLT  201 151  --   --   ? ?Recent Labs  ?Lab 07/21/21 ?1455 07/22/21 ?0151  ?NA 138 140  ?K 4.1 3.7  ?CL 104 108  ?CO2 25 26  ?GLUCOSE 141* 104*  ?BUN 43* 30*  ?CREATININE 0.77 0.75  ?CALCIUM 9.0 8.3*  ?MG  --  1.4*  ?ALKPHOS 94  --   ?AST 25  --   ?ALT 21  --    ?ALBUMIN 3.3*  --   ? ? ?EGD 07/22/21:  ?- Normal esophagus. ?- Normal stomach. Biopsied. ?- Non-bleeding duodenal ulcer with no stigmata of bleeding. Likely due to ibuprofen. ? ?Results/Tests Pending at Time of Discharge: Pathology from EGD  ? ?Discharge Medications:  ?Allergies as of 07/22/2021   ?No Known Allergies ?  ? ?  ?Medication List  ?  ? ?TAKE these medications   ? ?allopurinol 300 MG tablet ?Commonly known as: ZYLOPRIM ?Take 600 mg by mouth every morning. ?  ?apixaban 5 MG Tabs tablet ?Commonly known as: Eliquis ?Take 1 tablet (5 mg total) by mouth 2 (two) times daily. ?Start taking on: July 24, 2021 ?What changed: These instructions start on July 24, 2021. If you are unsure what to do until then, ask your doctor or other care provider. ?  ?Calcium 600 1500 (600 Ca) MG Tabs tablet ?Generic drug: calcium carbonate ?Take 600 mg of elemental calcium by mouth 2 (two) times daily. ?  ?clotrimazole 1 % cream ?Commonly known as: LOTRIMIN ?Apply 1 application. topically daily. ?  ?furosemide 20 MG tablet ?Commonly known as: LASIX ?Take 20 mg by mouth daily. ?  ?gabapentin 100 MG capsule ?Commonly known as: NEURONTIN ?200 mg 2 (two) times daily. Noon and midnight ?  ?magnesium oxide 400 MG tablet ?Commonly known as: MAG-OX ?Take 400 mg by mouth 2 (two) times daily. ?  ?metoprolol tartrate 25 MG tablet ?Commonly known as: LOPRESSOR ?Take 1 tablet (25 mg total) by mouth 2 (two) times daily. Hold SYSTOLIC BP <756, HR <43 ?What changed: Another medication with the same name was removed. Continue taking this medication, and follow the directions you see here. ?  ?morphine 15 MG 12 hr tablet ?Commonly known as: MS CONTIN ?Take 15 mg by mouth every 12 (twelve) hours. Noon and midnight ?  ?multivitamin with minerals Tabs tablet ?Take 1 tablet by mouth daily. ?  ?oxyCODONE 5 MG immediate release tablet ?Commonly known as: Oxy IR/ROXICODONE ?Take 1 tablet (5 mg total) by mouth every 4 (four) hours as needed for severe  pain. In addition to current chronic pain medication ?What changed:  ?how much to take ?when to take this ?  ?pantoprazole 40 MG tablet ?Commonly known as: Protonix ?Take 1 tablet (40 mg total) by mouth 2 (two) times daily. Pantoprazole 40 mg two times daily x 8 weeks, then '40mg'$  once daily ?  ?PRESCRIPTION MEDICATION ?Take 1 capsule by mouth at bedtime. Green and white capsule from [facility] ?  ?sildenafil 100 MG tablet ?Commonly known as: VIAGRA ?Take 100 mg by mouth daily as needed for erectile dysfunction. ?  ?tamsulosin 0.4 MG Caps capsule ?Commonly known as: FLOMAX ?Take 0.4 mg by mouth 2 (two) times daily. ?  ?Testosterone 20.25 MG/ACT (1.62%) Gel ?Apply 20.25 mg topically daily. Apply to shoulders ?  ?vitamin B-12 1000 MCG tablet ?Commonly known as: CYANOCOBALAMIN ?Take 1,000 mcg by mouth daily. ?  ?VITAMIN D3 PO ?Take 1 tablet by mouth daily. ?  ? ?  ? ? ?Discharge Instructions: Please refer to Patient Instructions  section of EMR for full details.  Patient was counseled important signs and symptoms that should prompt return to medical care, changes in medications, dietary instructions, activity restrictions, and follow up appointments.  ? ?Follow-Up Appointments: ? Follow-up Information   ? ? [facility] [employer] Call in 1 week(s).   ?Contact information: ?[address] ?[place] [address] ?[phone] ? ? ?  ?  ? ? Lorretta Harp, MD .   ?Specialties: Cardiology, Radiology ?Contact information: ?[address] ?[address] ?[place] [address] ?[phone] ? ? ?  ?  ? ? [facility]. Call.   ?Specialty: Gastroenterology ?Contact information: ?[address] ?[place] [address] ?[phone] ? ?  ?  ? ?  ?  ? ?  ? ? ?Erskine Emery, MD ?07/22/2021, 10:26 PM ?PGY-1, [place] ? ?

## 2021-07-22 NOTE — Op Note (Addendum)
[facility] ?Patient Name: [name] ?Procedure Date : 07/22/2021 ?MRN: [id number] ?Attending MD: Thornton Park MD, MD ?Date of Birth: 09-17-1945 ?CSN: [id number] ?Age: 76 ?Admit Type: Inpatient ?Procedure:                Upper GI endoscopy ?Indications:              Melena x 3 days, Suspected upper gastrointestinal  ?                          bleeding, intermittent use of ibuprofen, on chronic  ?                          Eliquis ?Providers:                Thornton Park MD, MD, Burtis Junes, RN, Hinton Dyer  ?                          Technician, Technician ?Referring MD:              ?Medicines:                Monitored Anesthesia Care ?Complications:            No immediate complications. ?Estimated Blood Loss:     Estimated blood loss was minimal. ?Procedure:                Pre-Anesthesia Assessment: ?                          - Prior to the procedure, a History and Physical  ?                          was performed, and patient medications and  ?                          allergies were reviewed. The patient's tolerance of  ?                          previous anesthesia was also reviewed. The risks  ?                          and benefits of the procedure and the sedation  ?                          options and risks were discussed with the patient.  ?                          All questions were answered, and informed consent  ?                          was obtained. Prior Anticoagulants: The patient has  ?                          taken Eliquis (apixaban), last dose was 3 days  ?  prior to procedure. ASA Grade Assessment: III - A  ?                          patient with severe systemic disease. After  ?                          reviewing the risks and benefits, the patient was  ?                          deemed in satisfactory condition to undergo the  ?                          procedure. ?                          After obtaining informed consent, the endoscope was  ?                           passed under direct vision. Throughout the  ?                          procedure, the patient's blood pressure, pulse, and  ?                          oxygen saturations were monitored continuously. The  ?                          [id number]-[id number] ([id number]) Olympus endoscope was introduced  ?                          through the mouth, and advanced to the third part  ?                          of duodenum. The upper GI endoscopy was  ?                          accomplished without difficulty. The patient  ?                          tolerated the procedure well. ?Scope In: ?Scope Out: ?Findings: ?     The examined esophagus was normal. ?     The entire examined stomach was normal. Biopsies were taken from the  ?     antrum, body, and fundus with a cold forceps for histology. Estimated  ?     blood loss was minimal. ?     One non-bleeding superficial duodenal ulcer with no stigmata of bleeding  ?     was found in the second portion of the duodenum. The lesion was 8 mm in  ?     largest dimension. ?Impression:               - Normal esophagus. ?                          - Normal stomach. Biopsied. ?                          -  Non-bleeding duodenal ulcer with no stigmata of  ?                          bleeding. Likely due to ibuprofen. ?Recommendation:           - Return patient to hospital ward for ongoing care. ?                          - Advance diet as tolerated. ?                          - Continue present medications. ?                          - Resume Eliquis in 48 hours. ?                          - Continue serial hgb/hct with transfusion as  ?                          indicated. ?                          - Pantoprazole 40 mg BID x 8 weeks, then reduce to  ?                          40 mg QAM. ?                          - Await pathology results. ?                          - No aspirin, ibuprofen, naproxen, or other  ?                          non-steroidal anti-inflammatory drugs. ?                           - Follow-up with outpatient gastroenterologist.  ?                          Consider upper endoscopy in 8 weeks to check  ?                          healing. ?                          The results were reviewed with the patient in the  ?                          PACU after the procedure. He declined my offer to  ?                          call anyone with the results after the procedure. ?  No additional inpatient GI evaluation planned at  ?                          this time. I update the FM resident regarding these  ?                          results and recommendations by phone. ?Procedure Code(s):        --- Professional --- ?                          [phone], Esophagogastroduodenoscopy, flexible,  ?                          transoral; with biopsy, single or multiple ?Diagnosis Code(s):        --- Professional --- ?                          K26.9, Duodenal ulcer, unspecified as acute or  ?                          chronic, without hemorrhage or perforation ?                          K92.1, Melena (includes Hematochezia) ?CPT copyright 2019 American Medical Association. All rights reserved. ?The codes documented in this report are preliminary and upon coder review may  ?be revised to meet current compliance requirements. ?Thornton Park MD, MD ?07/22/2021 3:16:08 PM ?This report has been signed electronically. ?Number of Addenda: 0 ?

## 2021-07-22 NOTE — Plan of Care (Signed)
Pt alert, orientated, and cooperative. VSS, RA, independent in room, chronic back pain, tolerable at this time, preparing for discharge.  ?

## 2021-07-22 NOTE — Progress Notes (Signed)
Patient refused to put tele or pulse Ox back on after endo.  Expects to go home.  Explained why tele was ordered but still will not put it on.  Remained respectful and considerate on both sides.  ?

## 2021-07-22 NOTE — Progress Notes (Addendum)
Telephone conversation with patient and resident: ? ?The resident contacted me about the patient wanting to leave and still refusing blood transfusion. ?He denies further bleeding, and he feels well in general. He is s/p EGD, and I have reviewed GI's recommendation. ?Given his CHF, his transfusion threshold is around 7.5-8. I discussed having 1 unit transfusion prior to his discharge. However, he is adamant he does not want to be transfused unless his level drops below 7. ?Risk and benefit were discussed, and he understands and has the capacity to make his own decisions. ?We are awaiting repeat hemoglobin today. If it is less than 7 we will transfuse. Otherwise, he can be discharged home at his request.  ?He will contact his PCP and GI for hospital f/u. ?He is aware he needs to hold his DOAC for two more days. ?Return precaution discussed. ?Reviewing his record and vitals, he is hemodynamically stable. ?The floor resident is aware of the plan. ? ?

## 2021-07-22 NOTE — Progress Notes (Signed)
Patient refused Large bore IV and refused blood at this point because Hg increased slightly in 0800 labs.  Want to continue to monitor for changes.  He was pleasant in refusal just does not want to get blood unless absolutely necessary.  I will follow up with provider.   ?

## 2021-07-22 NOTE — Progress Notes (Signed)
FPTS Interim Progress Note ? ?Went to evaluate patient and discuss needing 1 unit PRBC given drop in hemoglobin.  Patient reported that he needed a change in his medications as he takes oxycodone 10 mg every 4 hours, MS Contin 15 mg every 12 hours, gabapentin 200 mg twice daily.  Initially, we were unable to determine his pain regimen as he was unsure and med reconciliation had not been done.  We ordered MS Contin and oxycodone based on med list at hand.  Upon review of PDMP, will increase oxycodone to 10 mg and will add gabapentin to regimen and wait for med reconciliation for other medications.  Patient is amenable to this.  Additionally, the patient reports that he does not want to receive a unit of blood at this time.  He notes that if the hemoglobin goes down on next check, he will be amenable to receiving a unit of blood.  Risks of declining were discussed with the patient and all questions were answered.  We will obtain an H&H, PT, APTT scheduled for 0730 given that he has reported a decrease in bleeding with his stools but had a significant drop in hemoglobin, which she has stated he is amenable to.  Reassuringly, he has remained hemodynamically stable while on fluids with normotensive pressures.  I will closely monitor his vital signs. ? ?Erskine Emery, MD ?07/22/2021, 4:49 AM ?PGY-1, [facility] ?Service pager [phone] ? ?

## 2021-07-22 NOTE — Care Management Obs Status (Signed)
MEDICARE OBSERVATION STATUS NOTIFICATION ? ? ?Patient Details  ?Name: [name] ?MRN: [id number] ?Date of Birth: June 01, 1945 ? ? ?Medicare Observation Status Notification Given:  Yes ? ? ? ?Verdell Carmine, RN ?07/22/2021, 9:42 AM ?

## 2021-07-22 NOTE — Progress Notes (Signed)
?  Transition of Care (TOC) Screening Note ? ? ?Patient Details  ?Name: [name] ?Date of Birth: 06-29-1945 ? ? ?Transition of Care (TOC) CM/SW Contact:    ?Benard Halsted, LCSW ?Phone Number: ?07/22/2021, 9:15 AM ? ? ? ?Transition of Care Department [facility]) has reviewed patient and no TOC needs have been identified at this time. We will continue to monitor patient advancement through interdisciplinary progression rounds. If new patient transition needs arise, please place a TOC consult. ? ? ?

## 2021-07-22 NOTE — Progress Notes (Signed)
Spoke with GI Dr. Tarri Glenn regarding the EGD. Patient has a duodenal ulcer and biopsies were taken. He is okay to advance diet as tolerated. Recommended holding anticoagulation for 1-2 days. Follow-up will be with his primary GI provider in [place]. ? ? ?Jacquie Lukes, DO  ?

## 2021-07-22 NOTE — Discharge Instructions (Signed)
Dear [name],  ? ?Thank you so much for allowing [place] to be part of your care!  You were admitted to [facility] for a GI bleed. While you were here, you were seen by the GI team and had an EGD done that showed an ulcer in your duodenum that was no longer bleeding. Your Eliquis was held and can be resumed on 4/23 with close follow-up with your PCP for lab checks.  ? ? ?POST-HOSPITAL & CARE INSTRUCTIONS ?Restart your Eliquis on 4/23 ?Please follow-up with your PCP for hospital follow-up and to ensure your anemia is not worsening ?Follow-up with your GI doctors for further ulcer management ?Please let PCP/Specialists know of any changes that were made.  ?Please see medications section of this packet for any medication changes.  ? ?DOCTOR'S APPOINTMENT & FOLLOW UP CARE INSTRUCTIONS  ?No future appointments. ? ?RETURN PRECAUTIONS: ?- Continued bleeding or dark stools ?- Significant fatigue ?- Difficulty with breathing ? ?Take care and be well! ? ?Family Medicine Teaching Service  ?[place]  ?[facility]  ?[address] [place], [place] [address] ?[phone] ?

## 2021-07-22 NOTE — Transfer of Care (Signed)
Immediate Anesthesia Transfer of Care Note ? ?Patient: [name] ? ?Procedure(s) Performed: ESOPHAGOGASTRODUODENOSCOPY (EGD) WITH PROPOFOL ?BIOPSY ? ?Patient Location: PACU ? ?Anesthesia Type:MAC ? ?Level of Consciousness: awake ? ?Airway & Oxygen Therapy: Patient Spontanous Breathing ? ?Post-op Assessment: Report given to RN and Post -op Vital signs reviewed and stable ? ?Post vital signs: Reviewed and stable ? ?Last Vitals:  ?Vitals Value Taken Time  ?BP 110/74   ?Temp    ?Pulse 65   ?Resp 12   ?SpO2 98   ? ? ?Last Pain:  ?Vitals:  ? 07/22/21 1356  ?TempSrc: Temporal  ?PainSc: 0-No pain  ?   ? ?Patients Stated Pain Goal: 3 (07/22/21 0239) ? ?Complications: No notable events documented. ?

## 2021-07-22 NOTE — Progress Notes (Signed)
Pt discharged. Medications and follow-up instructions discussed with patient and all questions answered.  ?

## 2021-07-25 LAB — BPAM RBC
Blood Product Expiration Date: 202305182359
Unit Type and Rh: 5100

## 2021-07-25 LAB — TYPE AND SCREEN
ABO/RH(D): O POS
Antibody Screen: NEGATIVE
Unit division: 0

## 2021-07-26 ENCOUNTER — Encounter (HOSPITAL_COMMUNITY): Payer: Self-pay | Admitting: Gastroenterology

## 2021-07-27 LAB — SURGICAL PATHOLOGY

## 2021-08-02 ENCOUNTER — Encounter: Payer: Self-pay | Admitting: Gastroenterology

## 2021-08-03 ENCOUNTER — Telehealth: Payer: Self-pay | Admitting: Cardiovascular Disease

## 2021-08-03 NOTE — Telephone Encounter (Signed)
Called patient to discuss, unable to reach patient. LVM with call back number to call back. ? ?

## 2021-08-03 NOTE — Telephone Encounter (Signed)
Called patient, advised of message below.  ?Patient states that he was recently in the hospital for a GI bleed- they had him hold the Eliquis, but states he is on medications that would effect the Eliquis, and he is concerned. He wanted to go over which medications on his med list, and was asking questions in regards to a lot of his medications. I advised we could have an appointment to go over this, but patient states he doesn't feel this is needed. He request to speak with a pharmd in regards to the medications and which ones would effect the Eliquis.  ? ?

## 2021-08-03 NOTE — Telephone Encounter (Signed)
Pt c/o medication issue: ? ?1. Name of Medication: Eliquis ? ?2. How are you currently taking this medication (dosage and times per day)?  ? ?3. Are you having a reaction (difficulty breathing--STAT)?  ? ? ?4. What is your medication issue? They stopped hi Eliquis when he was in the hospital for GI bleed  on 07-21-21- need to discuss whath medicine cause the bleed and what he need to do? ? ?

## 2021-08-03 NOTE — Telephone Encounter (Signed)
Patient is returning call.  °

## 2021-08-03 NOTE — Progress Notes (Signed)
Letter sent via My Chart and mailed to home address.  ?

## 2021-08-04 NOTE — Telephone Encounter (Signed)
Spoke to patient . Comments read from the pharmacist  to patient. Patient verbalized understanding. Patient states if he has any medication changes moving forward he will  contact the office to set up appointment. ?

## 2021-08-04 NOTE — Telephone Encounter (Signed)
None of his medications interact with Eliquis or increase bleeding risk. ED note mentions he was taking ibuprofen which likely contributed to his GI bleed, he was appropriately discharged on PPI and advised to follow up with his GI doctor. He should avoid NSAIDs moving forward. He is on the correct dose of Eliquis based on his age, weight and kidney function. If he has multiple medication-related questions, agree he should have appt scheduled in office with PharmD for complete med review to discuss. ?

## 2021-08-30 ENCOUNTER — Other Ambulatory Visit (INDEPENDENT_AMBULATORY_CARE_PROVIDER_SITE_OTHER): Payer: Medicare Other

## 2021-08-30 ENCOUNTER — Encounter: Payer: Self-pay | Admitting: Physician Assistant

## 2021-08-30 ENCOUNTER — Ambulatory Visit (INDEPENDENT_AMBULATORY_CARE_PROVIDER_SITE_OTHER): Payer: Medicare Other | Admitting: Physician Assistant

## 2021-08-30 VITALS — BP 124/76 | HR 91 | Ht 73.0 in | Wt 215.0 lb

## 2021-08-30 DIAGNOSIS — D649 Anemia, unspecified: Secondary | ICD-10-CM | POA: Diagnosis not present

## 2021-08-30 DIAGNOSIS — K269 Duodenal ulcer, unspecified as acute or chronic, without hemorrhage or perforation: Secondary | ICD-10-CM

## 2021-08-30 LAB — CBC WITH DIFFERENTIAL/PLATELET
Basophils Absolute: 0.1 10*3/uL (ref 0.0–0.1)
Basophils Relative: 1.3 % (ref 0.0–3.0)
Eosinophils Absolute: 0.1 10*3/uL (ref 0.0–0.7)
Eosinophils Relative: 2.6 % (ref 0.0–5.0)
HCT: 27.3 % — ABNORMAL LOW (ref 39.0–52.0)
Hemoglobin: 9 g/dL — ABNORMAL LOW (ref 13.0–17.0)
Lymphocytes Relative: 39.1 % (ref 12.0–46.0)
Lymphs Abs: 1.6 10*3/uL (ref 0.7–4.0)
MCHC: 32.8 g/dL (ref 30.0–36.0)
MCV: 98.1 fl (ref 78.0–100.0)
Monocytes Absolute: 0.6 10*3/uL (ref 0.1–1.0)
Monocytes Relative: 14 % — ABNORMAL HIGH (ref 3.0–12.0)
Neutro Abs: 1.8 10*3/uL (ref 1.4–7.7)
Neutrophils Relative %: 43 % (ref 43.0–77.0)
Platelets: 200 10*3/uL (ref 150.0–400.0)
RBC: 2.78 Mil/uL — ABNORMAL LOW (ref 4.22–5.81)
RDW: 16.1 % — ABNORMAL HIGH (ref 11.5–15.5)
WBC: 4.1 10*3/uL (ref 4.0–10.5)

## 2021-08-30 NOTE — Progress Notes (Signed)
Chief Complaint: Follow-up hospitalization for bleeding duodenal ulcer  HPI:    [name] is a 76 year old male with a past medical history as listed below including A-fib on Eliquis, GERD and others, who presents to clinic today for follow-up after hospitalization for bleeding duodenal ulcer.    09/07/2010 colonoscopy with Dr. Fuller Plan with Fentanyl and Versed with a 4 mm sessile polyp in the cecum and a 3-4 mm polyp in the ascending colon x2 as well as a 5 mm sessile polyp in the rectum and internal hemorrhoids.  Recommend considering MAC for future procedures.  Pathology showed mixture of hyperplastic and tubular adenomas.  Repeat was recommended in 5 years.    07/22/2021 patient consulted by her service for GI blood loss anemia with 3 days of melena in the setting of chronic Eliquis for A-fib.  Hemoglobin 7.1.  Patient scheduled for EGD as below.    07/22/2021 EGD with Dr. Tarri Glenn with a nonbleeding duodenal ulcer and biopsies positive for gastritis.  At that time recommended pantoprazole 40 mg twice daily x8 weeks and then daily.  At that time recommended repeat EGD in 8 weeks to follow-up on healing.    Today, the patient tells me that he is doing well, he is continued on his Pantoprazole 40 mg twice a day since recent hospitalization and is no longer using Ibuprofen.  He no longer has any abdominal pain and his bowel movements are normal color.  Tells me that he regularly follows with the [facility] and he has asked them to prescribe his Pantoprazole going forward as they have records of everything that was just done for him in the hospital.    Denies fever, chills, weight loss or further blood in his stool.  Past Medical History:  Diagnosis Date   Atrial fibrillation with rapid ventricular response ([place]) 10/25/2018   Successful  DCCV shock x1 200 J 11/04/2018   Chronic back pain    buldging disc   Depression    GERD (gastroesophageal reflux disease)    GOUT    takes Allopurinol daily and Colchicine as  needed   History of bronchitis    many yrs ago   History of colonoscopy    benign   History of shingles    Joint pain    Joint swelling    Muscle spasm    takes Robaxin as needed   Nocturia    OSTEOARTHRITIS    Peripheral edema    takes Lasix daily as needed   Pneumonia 1969   Seasonal allergies    takes Zyrtec and uses Flonase is needed   TESTICULAR HYPOFUNCTION    Zenker diverticula     Past Surgical History:  Procedure Laterality Date   ANKLE SURGERY     4 times   BIOPSY  07/22/2021   Procedure: BIOPSY;  Surgeon: Thornton Park, MD;  Location: [facility] ENDOSCOPY;  Service: Gastroenterology;;   CAPSULOTOMY Right 08/20/2019   Procedure: SCAR CONTRACTURE AND CAPSULE RELEASE OF RIGHT BREAST TISSUE;  Surgeon: Wallace Going, DO;  Location: [place];  Service: Plastics;  Laterality: Right;   CARDIOVERSION N/A 11/04/2018   Procedure: CARDIOVERSION;  Surgeon: Acie Fredrickson, Wonda Cheng, MD;  Location: [facility] ENDOSCOPY;  Service: Cardiovascular;  Laterality: N/A;   COLONOSCOPY     ELBOW SURGERY Right    torn tendon   ESOPHAGOGASTRODUODENOSCOPY (EGD) WITH PROPOFOL N/A 07/22/2021   Procedure: ESOPHAGOGASTRODUODENOSCOPY (EGD) WITH PROPOFOL;  Surgeon: Thornton Park, MD;  Location: [place];  Service: Gastroenterology;  Laterality:  N/A;   INGUINAL HERNIA REPAIR     right   KNEE SURGERY     3 on each knee   SCAR REVISION Right 08/20/2019   Procedure: SCAR REVISION;  Surgeon: Wallace Going, DO;  Location: [place];  Service: Plastics;  Laterality: Right;   SHOULDER ARTHROSCOPY Right 06/15/2015   Procedure: RIGHT SHOULDER ARTHROSCOPY, ACROMIOPLASTY, DEBRIDEMENT;  Surgeon: Melrose Nakayama, MD;  Location: [place];  Service: Orthopedics;  Laterality: Right;   shoulder arthrosopy Left    TONSILLECTOMY AND ADENOIDECTOMY     TUMOR EXCISION     sebaceous cyst removed from chest   ZENKER'S DIVERTICULECTOMY ENDOSCOPIC N/A 06/14/2016   Procedure: ZENKER'S  DIVERTICULECTOMY ENDOSCOPIC;  Surgeon: Izora Gala, MD;  Location: [facility] OR;  Service: ENT;  Laterality: N/A;    Current Outpatient Medications  Medication Sig Dispense Refill   allopurinol (ZYLOPRIM) 300 MG tablet Take 600 mg by mouth every morning.     apixaban (ELIQUIS) 5 MG TABS tablet Take 1 tablet (5 mg total) by mouth 2 (two) times daily. 60 tablet 6   CALCIUM 600 1500 (600 Ca) MG TABS tablet Take 600 mg of elemental calcium by mouth 2 (two) times daily.      Cholecalciferol (VITAMIN D3 PO) Take 1 tablet by mouth daily.     clotrimazole (LOTRIMIN) 1 % cream Apply 1 application. topically daily.     furosemide (LASIX) 20 MG tablet Take 20 mg by mouth daily.     gabapentin (NEURONTIN) 100 MG capsule 200 mg 2 (two) times daily. Noon and midnight     magnesium oxide (MAG-OX) 400 MG tablet Take 400 mg by mouth 2 (two) times daily.     metoprolol tartrate (LOPRESSOR) 25 MG tablet Take 1 tablet (25 mg total) by mouth 2 (two) times daily. Hold SYSTOLIC BP <540, HR <08 30 tablet 0   morphine (MS CONTIN) 15 MG 12 hr tablet Take 15 mg by mouth every 12 (twelve) hours. Noon and midnight     Multiple Vitamin (MULTIVITAMIN WITH MINERALS) TABS tablet Take 1 tablet by mouth daily.     oxyCODONE (OXY IR/ROXICODONE) 5 MG immediate release tablet Take 1 tablet (5 mg total) by mouth every 4 (four) hours as needed for severe pain. In addition to current chronic pain medication (Patient taking differently: Take 10 mg by mouth every 3 (three) hours as needed for severe pain. In addition to current chronic pain medication) 30 tablet 0   pantoprazole (PROTONIX) 40 MG tablet Take 1 tablet (40 mg total) by mouth 2 (two) times daily. Pantoprazole 40 mg two times daily x 8 weeks, then 44m once daily 112 tablet 0   sildenafil (VIAGRA) 100 MG tablet Take 100 mg by mouth daily as needed for erectile dysfunction.     tamsulosin (FLOMAX) 0.4 MG CAPS capsule Take 0.4 mg by mouth 2 (two) times daily.     Testosterone 20.25  MG/ACT (1.62%) GEL Apply 20.25 mg topically daily. Apply to shoulders     vitamin B-12 (CYANOCOBALAMIN) 1000 MCG tablet Take 1,000 mcg by mouth daily.     PRESCRIPTION MEDICATION Take 1 capsule by mouth at bedtime. [facility]    No current facility-administered medications for this visit.    Allergies as of 08/30/2021   (No Known Allergies)    Family History  Family history unknown: Yes    Social History   Socioeconomic History   Marital status: Married    Spouse name: Not  on file   Number of children: Not on file   Years of education: Not on file   Highest education level: Not on file  Occupational History   Not on file  Tobacco Use   Smoking status: Never   Smokeless tobacco: Never  Vaping Use   Vaping Use: Never used  Substance and Sexual Activity   Alcohol use: Yes    Alcohol/week: 28.0 standard drinks    Types: 14 Glasses of wine, 14 Cans of beer per week    Comment: daily   Drug use: No   Sexual activity: Not on file  Other Topics Concern   Not on file  Social History Narrative   Not on file   Social Determinants of Health   Financial Resource Strain: Not on file  Food Insecurity: Not on file  Transportation Needs: Not on file  Physical Activity: Not on file  Stress: Not on file  Social Connections: Not on file  Intimate Partner Violence: Not on file    Review of Systems:    Constitutional: No weight loss, fever or chills Cardiovascular: No chest pain Respiratory: No SOB  Gastrointestinal: See HPI and otherwise negative   Physical Exam:  Vital signs: BP 124/76   Pulse 91   Ht _0  (1.854 m)   Wt 215 lb (97.5 kg)   SpO2 98%   BMI 28.37 kg/m    Constitutional:   Pleasant Caucasian male appears to be in NAD, Well developed, Well nourished, alert and cooperative Respiratory: Respirations even and unlabored. Lungs clear to auscultation bilaterally.   No wheezes, crackles, or rhonchi.  Cardiovascular: Normal S1, S2. No  MRG. Regular rate and rhythm. No peripheral edema, cyanosis or pallor.  Gastrointestinal:  Soft, nondistended, nontender. No rebound or guarding. Normal bowel sounds. No appreciable masses or hepatomegaly. Rectal:  Not performed.  Psychiatric:  Demonstrates good judgement and reason without abnormal affect or behaviors.  RELEVANT LABS AND IMAGING: CBC    Component Value Date/Time   WBC 4.1 07/22/2021 0151   RBC 2.08 (L) 07/22/2021 0151   HGB 7.3 (L) 07/22/2021 1830   HGB 13.6 01/14/2015 1317   HCT 21.5 (L) 07/22/2021 1830   HCT 39.5 01/14/2015 1317   PLT 151 07/22/2021 0151   PLT 202 01/14/2015 1317   MCV 101.9 (H) 07/22/2021 0151   MCV 99.2 (H) 01/14/2015 1317   MCH 34.1 (H) 07/22/2021 0151   MCHC 33.5 07/22/2021 0151   RDW 13.8 07/22/2021 0151   RDW 13.1 01/14/2015 1317   LYMPHSABS 1.7 10/12/2018 0445   LYMPHSABS 2.0 01/14/2015 1317   MONOABS 0.6 10/12/2018 0445   MONOABS 0.7 01/14/2015 1317   EOSABS 0.0 10/12/2018 0445   EOSABS 0.0 01/14/2015 1317   BASOSABS 0.0 10/12/2018 0445   BASOSABS 0.0 01/14/2015 1317    CMP     Component Value Date/Time   NA 140 07/22/2021 0151   NA 135 09/09/2019 1552   NA 140 01/14/2015 1317   K 3.7 07/22/2021 0151   K 4.3 01/14/2015 1317   CL 108 07/22/2021 0151   CO2 26 07/22/2021 0151   CO2 26 01/14/2015 1317   GLUCOSE 104 (H) 07/22/2021 0151   GLUCOSE 104 01/14/2015 1317   BUN 30 (H) 07/22/2021 0151   BUN 21 09/09/2019 1552   BUN 16.6 01/14/2015 1317   CREATININE 0.75 07/22/2021 0151   CREATININE 0.8 01/14/2015 1317   CALCIUM 8.3 (L) 07/22/2021 0151   CALCIUM 9.1 01/14/2015 1317   PROT 7.1 07/21/2021  1455   PROT 7.1 01/14/2015 1317   ALBUMIN 3.3 (L) 07/21/2021 1455   ALBUMIN 3.7 01/14/2015 1317   AST 25 07/21/2021 1455   AST 35 (H) 01/14/2015 1317   ALT 21 07/21/2021 1455   ALT 34 01/14/2015 1317   ALKPHOS 94 07/21/2021 1455   ALKPHOS 108 01/14/2015 1317   BILITOT 1.0 07/21/2021 1455   BILITOT 0.92 01/14/2015 1317    GFRNONAA >60 07/22/2021 0151   GFRAA 83 09/09/2019 1552    Assessment: 1.  History of bleeding duodenal ulcer with symptomatic anemia: Recent EGD in the hospital with duodenal ulcer related to ibuprofen usage and Eliquis, no bleeding since per patient, doing well on Pantoprazole 40 twice daily 2.  A-fib on Eliquis  Plan: 1.  Patient would like to continue following with Dr. Fuller Plan as he has seen him previously in clinic here.  I have messaged Dr. Fuller Plan to make sure he is okay with patient continuing to follow-up with him.  If not he will be a Dr. Tarri Glenn patient. 2.  Per Dr. Tarri Glenn she recommended discussion of a repeat EGD to check on duodenal ulcer healing.  Patient is leery of anesthesia.  Explained that I would confirm with Dr. Fuller Plan he feels that he needs another EGD prior to scheduling this. 3.  For now continue Pantoprazole 40 mg for total of 8 weeks and then decrease to once daily for prophylaxis going forward. 4.  Patient will repeat CBC today 5.  Patient to return to clinic per recommendations after discussion with Dr. Fuller Plan as above  Ellouise Newer, PA-C [facility] 08/30/2021, 10:04 AM  Cc: Administration, [facility]

## 2021-08-30 NOTE — Patient Instructions (Addendum)
Your provider has requested that you go to the basement level for lab work before leaving today. Press "B" on the elevator. The lab is located at the first door on the left as you exit the elevator.  If you are age 76 or older, your body mass index should be between 23-30. Your Body mass index is 28.37 kg/m. If this is out of the aforementioned range listed, please consider follow up with your Primary Care Provider.  If you are age 22 or younger, your body mass index should be between 19-25. Your Body mass index is 28.37 kg/m. If this is out of the aformentioned range listed, please consider follow up with your Primary Care Provider.   ________________________________________________________  The [facility] providers would like to encourage you to use [facility] to communicate with providers for non-urgent requests or questions.  Due to long hold times on the telephone, sending your provider a message by [facility] may be a faster and more efficient way to get a response.  Please allow 48 business hours for a response.  Please remember that this is for non-urgent requests.  _______________________________________________________

## 2021-08-31 ENCOUNTER — Telehealth: Payer: Self-pay

## 2021-08-31 DIAGNOSIS — D649 Anemia, unspecified: Secondary | ICD-10-CM

## 2021-08-31 DIAGNOSIS — K269 Duodenal ulcer, unspecified as acute or chronic, without hemorrhage or perforation: Secondary | ICD-10-CM

## 2021-08-31 NOTE — Progress Notes (Signed)
Schedule follow up a diagnostic EGD on Eliquis with me after at least 8 weeks of PPI and avoidance of NSAIDs. Given his anticoagulation needs a follow up EGD to document DU healing is recommended.

## 2021-08-31 NOTE — Telephone Encounter (Signed)
Called and spoke with patient regarding Dr. Lynne Leader recommendations. 8-week mark will be 09/16/21, I offered pt an appt in the [facility] on 09/19/21. Pt states that he will be leaving [place] on 6/15 and will not return until mid-September. Dr. Fuller Plan does not have any appts prior to patient leaving [place]. I told the pt that I will see how you would like to proceed and will call him back. Please advise, thanks.

## 2021-08-31 NOTE — Telephone Encounter (Signed)
-----   Message from Levin Erp, [place] sent at 08/31/2021  9:54 AM EDT ----- Regarding: Needs repeat EGD with Dr. Fuller Plan Please let the patient know that Dr. Fuller Plan does want to do a repeat EGD.  This should be scheduled 8 weeks after his last one, he can stay on his blood thinner.  It can be in the [facility].  Thanks, JLL ----- Message ----- From: Ladene Artist, MD Sent: 08/31/2021   9:37 AM EDT To: Levin Erp, PA     ----- Message ----- From: Levin Erp, [place] Sent: 08/30/2021  10:30 AM EDT To: Ladene Artist, MD

## 2021-09-01 NOTE — Addendum Note (Signed)
Addended by: Yevette Edwards on: 09/01/2021 10:45 AM   Modules accepted: Orders

## 2021-09-01 NOTE — Telephone Encounter (Signed)
Lm on vm for patient to return call 

## 2021-09-01 NOTE — Telephone Encounter (Signed)
Dr. Tarri Glenn does not have any availability in the [facility] prior to pt's departure. Dr. Fuller Plan now has availability next week for an EGD. Anderson Malta is aware that this is about 7 weeks since his last procedure, Anderson Malta stated that this is fine.   Lm on vm for patient to return call.

## 2021-09-01 NOTE — Telephone Encounter (Signed)
Patient returned your call wanting to speak with you, please advise.

## 2021-09-01 NOTE — Telephone Encounter (Signed)
Pt returned call. He has been scheduled for an EGD in the [facility] with Dr. Fuller Plan on Thursday, 09/08/21 at 1:30 pm. Pt is aware that he will need to arrive at our office by 12:30 pm with a care partner. Pt reports that he has access to his MyChart and knows that I will send his instructions there. Pt has been advised that he can continue his Eliquis. Pt verbalized understanding of all information and had no concerns at the end of the call.  Ambulatory referral to GI in epic.   EGD instructions sent to patient via [place].

## 2021-09-02 ENCOUNTER — Telehealth: Payer: Self-pay

## 2021-09-02 NOTE — Telephone Encounter (Signed)
----- Message from [place] Slider sent at 09/02/2021 11:20 AM EDT ----- Regarding: RE: follow with you? Per VA if he wants them to cover it he needs to call his doctor at the [facility] and have them send [place] a referral/auth to be seen at our office ----- Message ----- From: Yevette Edwards, RN Sent: 09/02/2021  10:47 AM EDT To: [place] Subject: RE: follow with you?                           Hey Es, any update on this? ----- Message ----- From: Darden Dates Sent: 09/01/2021  10:47 AM EDT To: [place] Slider, Yevette Edwards, RN Subject: FW: follow with you?                           Es, can you take a look at this man's policy?  I'm thinking he does need an auth to be seen with [place], and could you get back with [facility] please? Thanks, Amy ----- Message ----- From: Yevette Edwards, RN Sent: 09/01/2021  10:07 AM EDT To: Darden Dates Subject: FW: follow with you?                           Hey Amy, I have a question. I may be scheduling this patient for an EGD in the next week or so in the [facility]. Do I need to obtain VA referral? It looks like he also has Medicare. Could you let me know. Thanks ----- Message ----- From: Levin Erp, PA Sent: 08/31/2021   9:56 AM EDT To: Ladene Artist, MD, Yevette Edwards, RN Subject: RE: follow with you?                           Thank you, we will work on it.  FYI Uliana Brinker-may need VA referral for care.  Thanks-JLL ----- Message ----- From: Ladene Artist, MD Sent: 08/31/2021   9:09 AM EDT To: Levin Erp, PA Subject: RE: follow with you?                           Hi,  He saw me in 2012. He changed gastroenterologists to Roosvelt Maser, MD with [facility]. He underwent colonoscopy and EGD in Feb 2019 with Dr. Truddie Crumble. His post hospital follow up should have been with Dr. Truddie Crumble, [facility]. Unfortunately this did not occur. The procedure report and/or the hospital sign off  note should clearly state the name of the GI doc for outpatient follow up. I try to always put this information in my procedure note and/or sign off note.   I will see him for follow up since I previously saw him and he requested me. With his use of a long term anticoagulant it is reasonable to proceed with a follow up EGD. Please schedule EGD with me. For a DU with no anticoagulant / antiplatelet needs I would generally not recommend a follow up EGD. He can remain on Eliquis for the EGD since he had a recent EGD by [facility] with with gastric biopsies for H pylori. He is a [place] patient so appropriate VA referral for care is needed.   Thanks,   MS  ----- Message ----- From: Ellouise Newer  [place], [place] Sent: 08/30/2021  10:25 AM EDT To: Ladene Artist, MD Subject: follow with you?                               Dr. Fuller Plan, this patient wishes to continue following with you, apparently saw you years ago for colonoscopy, in between he had been to [facility] for an EGD and colonoscopy per notes from Dr. Tarri Glenn at recent hospitalization.  He wishes to still see you.  I do not think he will necessarily need to be seen unless you feel like he needs repeat EGD for evaluation of duodenal ulcer healing as per recommendations from Dr. Tarri Glenn.  I wanted to confirm this with you so I did not schedule it today.  Also I believe there are varying ideas on checking for duodenal ulcer healing and wanted to make sure you felt this is necessary before scheduling him for more sedation which he is leery of on his blood thinner.  If you do not wish him to follow with you in the future then I guess he will be Dr. Tarri Glenn patient here. Please let me know.  Thanks, JL L

## 2021-09-02 NOTE — Telephone Encounter (Signed)
Pt also reports that he has Medicare and Tricare for [employer] and would like to use those for his procedure. He is not looking to have [place] from [place]. Es has been notified.

## 2021-09-02 NOTE — Telephone Encounter (Signed)
Called and spoke with patient. He stated that he has not been able to access MyChart because his computer is messed up. They will be coming to look at his computer today. Pt wanted to know if there was anything pertinent in his instructions. I told pt that he can continue his Eliquis and make sure to not have anything to eat or drink after midnight the night before his procedure. I asked pt if he wanted to pick up a printed copy of his instrucitons nad he said no that his computer should be fixed later today. Pt also reports that he has Medicare and Tricare for [employer] and would like to use those for his procedure. He is not looking to have [place] from [place]. Pt had no other concerns at the end of the call.

## 2021-09-04 ENCOUNTER — Encounter: Payer: Self-pay | Admitting: Certified Registered Nurse Anesthetist

## 2021-09-05 ENCOUNTER — Encounter (HOSPITAL_COMMUNITY): Payer: Self-pay

## 2021-09-05 ENCOUNTER — Ambulatory Visit (HOSPITAL_COMMUNITY)
Admission: EM | Admit: 2021-09-05 | Discharge: 2021-09-05 | Disposition: A | Discharge status: Home / Self Care | Payer: Medicare Other | Attending: Physician Assistant | Admitting: Physician Assistant

## 2021-09-05 DIAGNOSIS — R21 Rash and other nonspecific skin eruption: Secondary | ICD-10-CM

## 2021-09-05 DIAGNOSIS — S50862A Insect bite (nonvenomous) of left forearm, initial encounter: Secondary | ICD-10-CM

## 2021-09-05 DIAGNOSIS — W57XXXA Bitten or stung by nonvenomous insect and other nonvenomous arthropods, initial encounter: Secondary | ICD-10-CM

## 2021-09-05 MED ORDER — DOXYCYCLINE HYCLATE 100 MG PO CAPS: 100.0000 mg | ORAL_CAPSULE | Freq: Two times a day (BID) | ORAL | 0 refills | Status: AC

## 2021-09-05 NOTE — Discharge Instructions (Signed)
Take antibiotic as prescribed Apply antibiotic cream and keep covered Return for evaluation if symptoms become worse.

## 2021-09-05 NOTE — ED Triage Notes (Signed)
Possible tick bite on the left forearm. Patient was doing yard work. Patient having a ring with a bite in the center. Patient feeling weak and fatigued.  Patient did not see the tick.

## 2021-09-05 NOTE — ED Provider Notes (Signed)
[place]    CSN: [id number] Arrival date & time: 09/05/21  1523      History   Chief Complaint Chief Complaint  Patient presents with   Insect Bite    HPI [name] is a 76 y.o. male.   Pt presents with what he thinks is a possible tick bite to his left forearm.  He reports he did not see a tick or remove a tick, but has had a small wound with surrounding redness on his left forearm after working in his yard.  He is concerned it may be from a tick due to surrounding redness.  He reports chills and subjective fever.  He has tried nothing for the sx.    Past Medical History:  Diagnosis Date   Atrial fibrillation with rapid ventricular response ([place]) 10/25/2018   Successful  DCCV shock x1 200 J 11/04/2018   Chronic back pain    buldging disc   Depression    GERD (gastroesophageal reflux disease)    GOUT    takes Allopurinol daily and Colchicine as needed   History of bronchitis    many yrs ago   History of colonoscopy    benign   History of shingles    Joint pain    Joint swelling    Muscle spasm    takes Robaxin as needed   Nocturia    OSTEOARTHRITIS    Peripheral edema    takes Lasix daily as needed   Pneumonia 1969   Seasonal allergies    takes Zyrtec and uses Flonase is needed   TESTICULAR HYPOFUNCTION    Zenker diverticula     Patient Active Problem List   Diagnosis Date Noted   Melena    Duodenal ulcer    GI bleed 07/21/2021   Secondary hypercoagulable state ([place]) 01/28/2020   Brow ptosis, bilateral 09/12/2019   Lower extremity edema 09/09/2019   Breast pain 06/17/2019   Acquired absence of nipple, right 06/17/2019   Anticoagulated 10/25/2018   Acute on chronic combined systolic and diastolic CHF (congestive heart failure) ([place]) 10/25/2018   Chronic pain 10/12/2018   Macrocytic anemia 10/12/2018   Atrial fibrillation ([place]) 10/12/2018   Atrial fibrillation with RVR ([place]) 10/11/2018   Oropharyngeal dysphagia 08/14/2017   Sore  throat 07/21/2016   Vasomotor rhinitis 06/23/2016   Zenker's diverticulum 06/14/2016   Chronic venous insufficiency 06/28/2015   Vitamin B12 deficiency 02/07/2013   History of colonic polyps 09/15/2010   Weakness generalized 06/09/2010   TESTICULAR HYPOFUNCTION 01/04/2010   PULMONARY NODULE, LEFT LOWER LOBE 01/04/2010   Allergic rhinitis 06/17/2009   WEIGHT LOSS 03/17/2009   GOUT 05/04/2008   Osteoarthritis 05/04/2008   LOW BACK PAIN 05/04/2008    Past Surgical History:  Procedure Laterality Date   ANKLE SURGERY     4 times   BIOPSY  07/22/2021   Procedure: BIOPSY;  Surgeon: Thornton Park, MD;  Location: [facility] ENDOSCOPY;  Service: Gastroenterology;;   CAPSULOTOMY Right 08/20/2019   Procedure: SCAR CONTRACTURE AND CAPSULE RELEASE OF RIGHT BREAST TISSUE;  Surgeon: Wallace Going, DO;  Location: [place];  Service: Plastics;  Laterality: Right;   CARDIOVERSION N/A 11/04/2018   Procedure: CARDIOVERSION;  Surgeon: Acie Fredrickson Wonda Cheng, MD;  Location: [facility] ENDOSCOPY;  Service: Cardiovascular;  Laterality: N/A;   COLONOSCOPY     ELBOW SURGERY Right    torn tendon   ESOPHAGOGASTRODUODENOSCOPY (EGD) WITH PROPOFOL N/A 07/22/2021   Procedure: ESOPHAGOGASTRODUODENOSCOPY (EGD) WITH PROPOFOL;  Surgeon: Thornton Park,  MD;  Location: [place];  Service: Gastroenterology;  Laterality: N/A;   INGUINAL HERNIA REPAIR     right   KNEE SURGERY     3 on each knee   SCAR REVISION Right 08/20/2019   Procedure: SCAR REVISION;  Surgeon: Wallace Going, DO;  Location: [place];  Service: Plastics;  Laterality: Right;   SHOULDER ARTHROSCOPY Right 06/15/2015   Procedure: RIGHT SHOULDER ARTHROSCOPY, ACROMIOPLASTY, DEBRIDEMENT;  Surgeon: Melrose Nakayama, MD;  Location: [place];  Service: Orthopedics;  Laterality: Right;   shoulder arthrosopy Left    TONSILLECTOMY AND ADENOIDECTOMY     TUMOR EXCISION     sebaceous cyst removed from chest   ZENKER'S DIVERTICULECTOMY  ENDOSCOPIC N/A 06/14/2016   Procedure: ZENKER'S DIVERTICULECTOMY ENDOSCOPIC;  Surgeon: Izora Gala, MD;  Location: [place];  Service: ENT;  Laterality: N/A;       Home Medications    Prior to Admission medications   Medication Sig Start Date End Date Taking? Authorizing Provider  allopurinol (ZYLOPRIM) 300 MG tablet Take 600 mg by mouth every morning.   Yes [provider]  apixaban (ELIQUIS) 5 MG TABS tablet Take 1 tablet (5 mg total) by mouth 2 (two) times daily. 07/24/21  Yes Erskine Emery, MD  CALCIUM 600 1500 (600 Ca) MG TABS tablet Take 600 mg of elemental calcium by mouth 2 (two) times daily.  02/16/16  Yes [provider]  Cholecalciferol (VITAMIN D3 PO) Take 1 tablet by mouth daily.   Yes [provider]  clotrimazole (LOTRIMIN) 1 % cream Apply 1 application. topically daily.   Yes [provider]  doxycycline (VIBRAMYCIN) 100 MG capsule Take 1 capsule (100 mg total) by mouth 2 (two) times daily for 10 days. 09/05/21 09/15/21 Yes Ward, Lenise Arena, PA-C  furosemide (LASIX) 20 MG tablet Take 20 mg by mouth daily.   Yes [provider]  gabapentin (NEURONTIN) 100 MG capsule 200 mg 2 (two) times daily. Noon and midnight 05/24/21  Yes [provider]  magnesium oxide (MAG-OX) 400 MG tablet Take 400 mg by mouth 2 (two) times daily. 07/21/19  Yes [provider]  metoprolol tartrate (LOPRESSOR) 25 MG tablet Take 1 tablet (25 mg total) by mouth 2 (two) times daily. Hold SYSTOLIC BP <786, HR <76 10/20/92  Yes Cleaver, Jossie Ng, NP  morphine (MS CONTIN) 15 MG 12 hr tablet Take 15 mg by mouth every 12 (twelve) hours. Noon and midnight   Yes [provider]  Multiple Vitamin (MULTIVITAMIN WITH MINERALS) TABS tablet Take 1 tablet by mouth daily.   Yes [provider]  oxyCODONE (OXY IR/ROXICODONE) 5 MG immediate release tablet Take 1 tablet (5 mg total) by mouth every 4 (four) hours as needed for severe pain. In addition to  current chronic pain medication Patient taking differently: Take 10 mg by mouth every 3 (three) hours as needed for severe pain. In addition to current chronic pain medication 06/15/15  Yes Loni Dolly, PA-C  pantoprazole (PROTONIX) 40 MG tablet Take 1 tablet (40 mg total) by mouth 2 (two) times daily. Pantoprazole 40 mg two times daily x 8 weeks, then '40mg'$  once daily 07/22/21 09/16/21 Yes Maxwell, Allee, MD  sildenafil (VIAGRA) 100 MG tablet Take 100 mg by mouth daily as needed for erectile dysfunction.   Yes [provider]  tamsulosin (FLOMAX) 0.4 MG CAPS capsule Take 0.4 mg by mouth 2 (two) times daily.   Yes [provider]  Testosterone 20.25 MG/ACT (1.62%) GEL Apply 20.25  mg topically daily. Apply to shoulders 06/05/20  Yes [provider]  vitamin B-12 (CYANOCOBALAMIN) 1000 MCG tablet Take 1,000 mcg by mouth daily. 12/31/18  Yes [provider]  PRESCRIPTION MEDICATION Take 1 capsule by mouth at bedtime. Green and white capsule from [facility]    [provider]    Family History Family History  Problem Relation Age of Onset   Colon cancer Neg Hx    Stomach cancer Neg Hx    Esophageal cancer Neg Hx     Social History Social History   Tobacco Use   Smoking status: Never   Smokeless tobacco: Never  Vaping Use   Vaping Use: Never used  Substance Use Topics   Alcohol use: Yes    Alcohol/week: 28.0 standard drinks    Types: 14 Glasses of wine, 14 Cans of beer per week    Comment: daily   Drug use: No     Allergies   Patient has no known allergies.   Review of Systems Review of Systems  Constitutional:  Negative for chills and fever.  HENT:  Negative for ear pain and sore throat.   Eyes:  Negative for pain and visual disturbance.  Respiratory:  Negative for cough and shortness of breath.   Cardiovascular:  Negative for chest pain and palpitations.  Gastrointestinal:  Negative for abdominal pain and vomiting.  Genitourinary:  Negative  for dysuria and hematuria.  Musculoskeletal:  Negative for arthralgias and back pain.  Skin:  Positive for rash. Negative for color change.  Neurological:  Negative for seizures and syncope.  All other systems reviewed and are negative.   Physical Exam Triage Vital Signs ED Triage Vitals  Enc Vitals Group     BP 09/05/21 1549 130/62     Pulse Rate 09/05/21 1549 95     Resp 09/05/21 1549 16     Temp 09/05/21 1549 98.3 F (36.8 C)     Temp Source 09/05/21 1549 Oral     SpO2 09/05/21 1549 94 %     Weight 09/05/21 1552 205 lb (93 kg)     Height 09/05/21 1552 '6\' 1"'$  (1.854 m)     Head Circumference --      Peak Flow --      Pain Score 09/05/21 1552 4     Pain Loc --      Pain Edu? --      Excl. in [place]? --    No data found.  Updated Vital Signs BP 130/62 (BP Location: Left Arm)   Pulse 95   Temp 98.3 F (36.8 C) (Oral)   Resp 16   Ht '6\' 1"'$  (1.854 m)   Wt 205 lb (93 kg)   SpO2 94%   BMI 27.05 kg/m   Visual Acuity Right Eye Distance:   Left Eye Distance:   Bilateral Distance:    Right Eye Near:   Left Eye Near:    Bilateral Near:     Physical Exam Vitals and nursing note reviewed.  Constitutional:      General: He is not in acute distress.    Appearance: He is well-developed.  HENT:     Head: Normocephalic and atraumatic.  Eyes:     Conjunctiva/sclera: Conjunctivae normal.  Cardiovascular:     Rate and Rhythm: Normal rate and regular rhythm.     Heart sounds: No murmur heard. Pulmonary:     Effort: Pulmonary effort is normal. No respiratory distress.     Breath sounds: Normal breath sounds.  Abdominal:     Palpations: Abdomen is soft.     Tenderness: There is no abdominal tenderness.  Musculoskeletal:        General: No swelling.       Arms:     Cervical back: Neck supple.  Skin:    General: Skin is warm and dry.     Capillary Refill: Capillary refill takes less than 2 seconds.  Neurological:     Mental Status: He is alert.  Psychiatric:         Mood and Affect: Mood normal.     UC Treatments / Results  Labs (all labs ordered are listed, but only abnormal results are displayed) Labs Reviewed - No data to display  EKG   Radiology No results found.  Procedures Procedures (including critical care time)  Medications Ordered in UC Medications - No data to display  Initial Impression / Assessment and Plan / UC Course  I have reviewed the triage vital signs and the nursing notes.  Pertinent labs & imaging results that were available during my care of the patient were reviewed by me and considered in my medical decision making (see chart for details).     Rash, possibly from tick bite.  Rash is in a bullseye pattern.  Will cover for doxycycline for possible Lyme's disease.  Advised follow up with primary care.  Discussed supportive care, return precautions given.  Final Clinical Impressions(s) / UC Diagnoses   Final diagnoses:  Rash and nonspecific skin eruption  Insect bite of left forearm, initial encounter     Discharge Instructions      Take antibiotic as prescribed Apply antibiotic cream and keep covered Return for evaluation if symptoms become worse.     ED Prescriptions     Medication Sig Dispense Auth. Provider   doxycycline (VIBRAMYCIN) 100 MG capsule Take 1 capsule (100 mg total) by mouth 2 (two) times daily for 10 days. 20 capsule Ward, Lenise Arena, PA-C      PDMP not reviewed this encounter.   Ward, Lenise Arena, PA-C 09/05/21 1616

## 2021-09-06 ENCOUNTER — Ambulatory Visit (HOSPITAL_COMMUNITY)
Admission: RE | Admit: 2021-09-06 | Discharge: 2021-09-06 | Disposition: A | Discharge status: Home / Self Care | Payer: Medicare Other | Source: Ambulatory Visit | Attending: Physician Assistant | Admitting: Physician Assistant

## 2021-09-06 VITALS — BP 122/60 | HR 62 | Ht 73.0 in | Wt 218.8 lb

## 2021-09-06 DIAGNOSIS — I4892 Unspecified atrial flutter: Secondary | ICD-10-CM | POA: Diagnosis not present

## 2021-09-06 DIAGNOSIS — I5042 Chronic combined systolic (congestive) and diastolic (congestive) heart failure: Secondary | ICD-10-CM | POA: Diagnosis not present

## 2021-09-06 DIAGNOSIS — Z792 Long term (current) use of antibiotics: Secondary | ICD-10-CM | POA: Insufficient documentation

## 2021-09-06 DIAGNOSIS — Z7901 Long term (current) use of anticoagulants: Secondary | ICD-10-CM | POA: Insufficient documentation

## 2021-09-06 DIAGNOSIS — Z79899 Other long term (current) drug therapy: Secondary | ICD-10-CM | POA: Diagnosis not present

## 2021-09-06 DIAGNOSIS — K219 Gastro-esophageal reflux disease without esophagitis: Secondary | ICD-10-CM | POA: Diagnosis not present

## 2021-09-06 DIAGNOSIS — K225 Diverticulum of esophagus, acquired: Secondary | ICD-10-CM | POA: Insufficient documentation

## 2021-09-06 DIAGNOSIS — D6869 Other thrombophilia: Secondary | ICD-10-CM | POA: Diagnosis not present

## 2021-09-06 DIAGNOSIS — M1990 Unspecified osteoarthritis, unspecified site: Secondary | ICD-10-CM | POA: Insufficient documentation

## 2021-09-06 DIAGNOSIS — I4819 Other persistent atrial fibrillation: Secondary | ICD-10-CM | POA: Insufficient documentation

## 2021-09-06 DIAGNOSIS — M109 Gout, unspecified: Secondary | ICD-10-CM | POA: Diagnosis not present

## 2021-09-06 NOTE — Progress Notes (Signed)
Primary Care Physician: [facility] Primary Cardiologist: Dr Gwenlyn Found Primary Electrophysiologist: none Referring Physician: Dr Floria Raveling is a 76 y.o. male with a history of combined systolic/diastolic CHF, Zenker's diverticulum, atrial flutter and paroxysmal atrial fibrillation who presents for follow up in the [facility].  The patient was initially diagnosed with atrial fibrillation 10/2018 after presenting with symptoms of increased SOB, weight gain, and lower extremity edema. Patient was started on Eliquis for a CHADS2VASC score of 1 and underwent DCCV on 11/04/19. His Eliquis was later discontinued with a low CHADS2VASC score. He was seen in follow up 09/29/19 and a repeat echo was ordered which showed decreased EF 45-50%. His CHADS2VASC score is 3. He denies any awareness of his arrhythmia.   Patient was admitted 07/2021 for an acute upper GI bleed. He noted blood in his stool 2 days prior to presentation in ED and had symptoms of weakness. GI was consulted and patient had EGD on 4/21 that showed nonbleeding duodenal ulcer with no stigmata of bleeding, likely due to ibuprofen.  While in the hospital, Eliquis was held with recommendations to continue to hold for 2 days post discharge.  Patient continued to decline transfusion as the hemoglobin was not decreasing, it was 7.3 upon discharge. Of note, ECG during that admission showed atrial flutter. CBC on 08/30/21 showed hgb 9.0. Patient was seen at urgent care yesterday for a tick bite with a target lesion, started on doxycycline.   On follow up today, patient reports that he feels fatigued but otherwise well. He has no awareness of his arrhythmia. He is in atrial flutter today. No overt bleeding that he has noticed.   Today, he denies symptoms of palpitations, chest pain, shortness of breath, orthopnea, PND, dizziness, presyncope, syncope, snoring, daytime somnolence, bleeding, or neurologic  sequela. The patient is tolerating medications without difficulties and is otherwise without complaint today.    Atrial Fibrillation Risk Factors:  he does not have symptoms or diagnosis of sleep apnea. he does not have a history of rheumatic fever. he does have a history of alcohol use. The patient does not have a history of early familial atrial fibrillation or other arrhythmias.  he has a BMI of Body mass index is 28.87 kg/m.Marland Kitchen Filed Weights   09/06/21 1333  Weight: 99.2 kg     Family History  Problem Relation Age of Onset   Colon cancer Neg Hx    Stomach cancer Neg Hx    Esophageal cancer Neg Hx      Atrial Fibrillation Management history:  Previous antiarrhythmic drugs: none Previous cardioversions: 11/04/19 Previous ablations: none CHADS2VASC score: 3 Anticoagulation history: Eliquis   Past Medical History:  Diagnosis Date   Atrial fibrillation with rapid ventricular response ([place]) 10/25/2018   Successful  DCCV shock x1 200 J 11/04/2018   Chronic back pain    buldging disc   Depression    GERD (gastroesophageal reflux disease)    GOUT    takes Allopurinol daily and Colchicine as needed   History of bronchitis    many yrs ago   History of colonoscopy    benign   History of shingles    Joint pain    Joint swelling    Muscle spasm    takes Robaxin as needed   Nocturia    OSTEOARTHRITIS    Peripheral edema    takes Lasix daily as needed   Pneumonia 1969   Seasonal allergies  takes Zyrtec and uses Flonase is needed   TESTICULAR HYPOFUNCTION    Zenker diverticula    Past Surgical History:  Procedure Laterality Date   ANKLE SURGERY     4 times   BIOPSY  07/22/2021   Procedure: BIOPSY;  Surgeon: Thornton Park, MD;  Location: [facility] ENDOSCOPY;  Service: Gastroenterology;;   CAPSULOTOMY Right 08/20/2019   Procedure: SCAR CONTRACTURE AND CAPSULE RELEASE OF RIGHT BREAST TISSUE;  Surgeon: Wallace Going, DO;  Location: [place];   Service: Plastics;  Laterality: Right;   CARDIOVERSION N/A 11/04/2018   Procedure: CARDIOVERSION;  Surgeon: Acie Fredrickson, Wonda Cheng, MD;  Location: [facility] ENDOSCOPY;  Service: Cardiovascular;  Laterality: N/A;   COLONOSCOPY     ELBOW SURGERY Right    torn tendon   ESOPHAGOGASTRODUODENOSCOPY (EGD) WITH PROPOFOL N/A 07/22/2021   Procedure: ESOPHAGOGASTRODUODENOSCOPY (EGD) WITH PROPOFOL;  Surgeon: Thornton Park, MD;  Location: [place];  Service: Gastroenterology;  Laterality: N/A;   INGUINAL HERNIA REPAIR     right   KNEE SURGERY     3 on each knee   SCAR REVISION Right 08/20/2019   Procedure: SCAR REVISION;  Surgeon: Wallace Going, DO;  Location: [place];  Service: Plastics;  Laterality: Right;   SHOULDER ARTHROSCOPY Right 06/15/2015   Procedure: RIGHT SHOULDER ARTHROSCOPY, ACROMIOPLASTY, DEBRIDEMENT;  Surgeon: Melrose Nakayama, MD;  Location: [place];  Service: Orthopedics;  Laterality: Right;   shoulder arthrosopy Left    TONSILLECTOMY AND ADENOIDECTOMY     TUMOR EXCISION     sebaceous cyst removed from chest   ZENKER'S DIVERTICULECTOMY ENDOSCOPIC N/A 06/14/2016   Procedure: ZENKER'S DIVERTICULECTOMY ENDOSCOPIC;  Surgeon: Izora Gala, MD;  Location: [facility] OR;  Service: ENT;  Laterality: N/A;    Current Outpatient Medications  Medication Sig Dispense Refill   allopurinol (ZYLOPRIM) 300 MG tablet Take 600 mg by mouth every morning.     apixaban (ELIQUIS) 5 MG TABS tablet Take 1 tablet (5 mg total) by mouth 2 (two) times daily. 60 tablet 6   CALCIUM 600 1500 (600 Ca) MG TABS tablet Take 600 mg of elemental calcium by mouth 2 (two) times daily.      Cholecalciferol (VITAMIN D3 PO) Take 1 tablet by mouth daily.     clotrimazole (LOTRIMIN) 1 % cream Apply 1 application. topically daily.     doxycycline (VIBRAMYCIN) 100 MG capsule Take 1 capsule (100 mg total) by mouth 2 (two) times daily for 10 days. 20 capsule 0   famotidine (PEPCID) 20 MG tablet Take 1 tablet by mouth 2 (two)  times daily.     furosemide (LASIX) 20 MG tablet Take 20 mg by mouth as needed.     gabapentin (NEURONTIN) 100 MG capsule 200 mg 2 (two) times daily. Noon and midnight     magnesium oxide (MAG-OX) 400 MG tablet Take 400 mg by mouth 2 (two) times daily.     metoprolol tartrate (LOPRESSOR) 25 MG tablet Take 1 tablet (25 mg total) by mouth 2 (two) times daily. Hold SYSTOLIC BP <295, HR <28 30 tablet 0   morphine (MS CONTIN) 15 MG 12 hr tablet Take 15 mg by mouth every 12 (twelve) hours. Noon and midnight     Multiple Vitamin (MULTIVITAMIN WITH MINERALS) TABS tablet Take 1 tablet by mouth daily.     oxyCODONE (OXY IR/ROXICODONE) 5 MG immediate release tablet Take 1 tablet (5 mg total) by mouth every 4 (four) hours as needed for severe pain. In addition to current chronic pain  medication (Patient taking differently: Take 10 mg by mouth every 3 (three) hours as needed for severe pain. In addition to current chronic pain medication) 30 tablet 0   pantoprazole (PROTONIX) 40 MG tablet Take 1 tablet (40 mg total) by mouth 2 (two) times daily. Pantoprazole 40 mg two times daily x 8 weeks, then '40mg'$  once daily 112 tablet 0   PRESCRIPTION MEDICATION Take 1 capsule by mouth at bedtime. Green and white capsule from [facility]     sildenafil (VIAGRA) 100 MG tablet Take 100 mg by mouth daily as needed for erectile dysfunction.     tamsulosin (FLOMAX) 0.4 MG CAPS capsule Take 0.4 mg by mouth 2 (two) times daily.     Testosterone 20.25 MG/ACT (1.62%) GEL Apply 20.25 mg topically daily. Apply to shoulders     vitamin B-12 (CYANOCOBALAMIN) 1000 MCG tablet Take 1,000 mcg by mouth daily.     No current facility-administered medications for this encounter.    No Known Allergies  Social History   Socioeconomic History   Marital status: Married    Spouse name: Not on file   Number of children: Not on file   Years of education: Not on file   Highest education level: Not on file  Occupational History   Not on file   Tobacco Use   Smoking status: Never   Smokeless tobacco: Never  Vaping Use   Vaping Use: Never used  Substance and Sexual Activity   Alcohol use: Yes    Alcohol/week: 28.0 standard drinks    Types: 14 Glasses of wine, 14 Cans of beer per week    Comment: daily   Drug use: No   Sexual activity: Not on file  Other Topics Concern   Not on file  Social History Narrative   Not on file   Social Determinants of Health   Financial Resource Strain: Not on file  Food Insecurity: Not on file  Transportation Needs: Not on file  Physical Activity: Not on file  Stress: Not on file  Social Connections: Not on file  Intimate Partner Violence: Not on file     ROS- All systems are reviewed and negative except as per the HPI above.  Physical Exam: Vitals:   09/06/21 1333  BP: 122/60  Pulse: 62  Weight: 99.2 kg  Height: '6\' 1"'$  (1.854 m)     GEN- The patient is a well appearing elderly male, alert and oriented x 3 today.   HEENT-head normocephalic, atraumatic, sclera clear, conjunctiva pink, hearing intact, trachea midline. Lungs- Clear to ausculation bilaterally, normal work of breathing Heart- irregular rate and rhythm, no murmurs, rubs or gallops  GI- soft, NT, ND, + BS Extremities- no clubbing, cyanosis, 1+ bilateral edema MS- no significant deformity or atrophy Skin- target lesion on left forearm  Psych- euthymic mood, full affect Neuro- strength and sensation are intact   Wt Readings from Last 3 Encounters:  09/06/21 99.2 kg  09/05/21 93 kg  08/30/21 97.5 kg    EKG today demonstrates  Atrial flutter with predominantly 4:1 block Vent. rate 62 BPM PR interval * ms QRS duration 94 ms QT/QTcB 400/406 ms  Echo 02/14/21 demonstrated   1. Left ventricular ejection fraction, by estimation, is 50 to 55%. The  left ventricle has low normal function. The left ventricle has no regional  wall motion abnormalities. Left ventricular diastolic parameters were  normal.   2.  Right ventricular systolic function is normal. The right ventricular  size is normal. There is moderately  elevated pulmonary artery systolic  pressure. The estimated right ventricular systolic pressure is 62.8 mmHg.   3. Left atrial size was moderately dilated.   4. Right atrial size was moderately dilated.   5. The mitral valve is grossly normal. Mild mitral valve regurgitation.   6. The aortic valve is tricuspid. Aortic valve regurgitation is trivial.  Aortic valve sclerosis is present, with no evidence of aortic valve  stenosis. Aortic regurgitation PHT measures 567 msec.   7. Aortic dilatation noted. There is mild dilatation of the ascending  aorta, measuring 42 mm.   8. The inferior vena cava is dilated in size with <50% respiratory  variability, suggesting right atrial pressure of 15 mmHg.   Comparison(s): Changes from prior study are noted. 10/01/2019: LVEF 45-50%.   Epic records are reviewed at length today  CHA2DS2-VASc Score = 3  The patient's score is based upon: CHF History: 1 HTN History: 0 Diabetes History: 0 Stroke History: 0 Vascular Disease History: 0 Age Score: 2 Gender Score: 0        ASSESSMENT AND PLAN: 1. Persistent Atrial Fibrillation/atrial flutter The patient's CHA2DS2-VASc score is 3, indicating a 3.2% annual risk of stroke.   Patient in rate controlled atrial flutter. Unclear duration, could be persistent since April. He denies symptoms. We discussed therapeutic options. Patient has a trip planned for [place] next week and does not want to change therapy or pursue DCCV until he returns.  Continue Eliquis 5 mg BID Continue diltiazem 120 mg daily Continue Lopressor 25 mg BID  2. Secondary Hypercoagulable State (ICD10:  D68.69) The patient is at significant risk for stroke/thromboembolism based upon his CHA2DS2-VASc Score of 3.  Continue Apixaban (Eliquis).   3. Chronic combined systolic/diastolic CHF EF improved to 50-55% Has some lower extremity  edema today. He has not taken his PRN lasix today. He plans to take tomorrow.    Follow up in the AF clinic in 2 months.    [facility] [address] [place], [place] [address] [phone] 09/06/2021 4:05 PM

## 2021-09-07 ENCOUNTER — Telehealth: Payer: Self-pay | Admitting: Gastroenterology

## 2021-09-07 NOTE — Telephone Encounter (Signed)
Patient called today to cancel procedure for tomorrow.  His care partner is not available to be with him tomorrow.  He will call back to reschedule once he finds out when his care partner's schedule will be available for him to bring him.

## 2021-09-07 NOTE — Telephone Encounter (Signed)
Charge for late [facility] cancellation  

## 2021-09-08 ENCOUNTER — Encounter: Payer: Medicare Other | Admitting: Gastroenterology

## 2021-10-18 ENCOUNTER — Other Ambulatory Visit: Payer: Self-pay | Admitting: Student

## 2021-11-01 LAB — UNMAPPED LAB RESULTS
Basophil # (HT): 0 10 3/uL (ref 0.0–0.2)
Basophil % (HT): 1 % (ref 0–2)
Eosinophil # (HT): 0 10 3/uL (ref 0.0–0.5)
Eosinophil % (HT): 1 % (ref 0–7)
Hematocrit (HT): 32 % — ABNORMAL LOW (ref 40–52)
Hemoglobin (HGB) (HT): 10 g/dL — ABNORMAL LOW (ref 13.0–17.5)
Lymphocyte # (HT): 1.8 10 3/uL (ref 0.9–3.8)
Lymphocyte % (HT): 32 % (ref 17–44)
MCHC (HT): 31.3 g/dL — ABNORMAL LOW (ref 32.0–36.0)
MCV (HT): 94 fL (ref 81–99)
Mean Corpuscular Hemoglobin (MCH) (HT): 29.4 pg (ref 26.0–34.0)
Monocyte # (HT): 0.8 10 3/uL (ref 0.2–1.0)
Monocyte % (HT): 14 % (ref 4–15)
Neutrophil # (HT): 2.9 10 3/uL (ref 1.5–7.7)
Platelets (HT): 177 10 3/uL (ref 140–400)
RBC (HT): 3.4 10 6/uL — ABNORMAL LOW (ref 4.60–6.20)
RDW (HT): 18.1 % — ABNORMAL HIGH (ref 11.5–15.0)
Seg Neut % (HT): 53 % (ref 43–75)
WBC (HT): 5.5 10 3/uL (ref 4.0–10.0)

## 2021-11-09 LAB — UNMAPPED LAB RESULTS
Basophil # (HT): 0.1 10 3/uL (ref 0.0–0.2)
Basophil % (HT): 1 % (ref 0–2)
Eosinophil # (HT): 0 10 3/uL (ref 0.0–0.5)
Eosinophil % (HT): 0 % (ref 0–7)
Hematocrit (HT): 32 % — ABNORMAL LOW (ref 40–52)
Hemoglobin (HGB) (HT): 10.3 g/dL — ABNORMAL LOW (ref 13.0–17.5)
Lymphocyte # (HT): 2.3 10 3/uL (ref 0.9–3.8)
Lymphocyte % (HT): 21 % (ref 17–44)
MCHC (HT): 31.8 g/dL — ABNORMAL LOW (ref 32.0–36.0)
MCV (HT): 95 fL (ref 81–99)
Mean Corpuscular Hemoglobin (MCH) (HT): 30 pg (ref 26.0–34.0)
Monocyte # (HT): 1 10 3/uL (ref 0.2–1.0)
Monocyte % (HT): 9 % (ref 4–15)
Neutrophil # (HT): 7.7 10 3/uL (ref 1.5–7.7)
Platelets (HT): 192 10 3/uL (ref 140–400)
RBC (HT): 3.43 10 6/uL — ABNORMAL LOW (ref 4.60–6.20)
RDW (HT): 18.6 % — ABNORMAL HIGH (ref 11.5–15.0)
Seg Neut % (HT): 69 % (ref 43–75)
WBC (HT): 11.1 10 3/uL — ABNORMAL HIGH (ref 4.0–10.0)

## 2021-11-10 ENCOUNTER — Other Ambulatory Visit
Admission: RE | Admit: 2021-11-10 | Discharge: 2021-11-10 | Disposition: A | Discharge status: Home / Self Care | Payer: TRICARE For Life (TFL) | Source: Ambulatory Visit

## 2021-11-10 LAB — UNMAPPED LAB RESULTS
ABO RH Blood Type (HT): O POS
Antibody Screen (HT): NEGATIVE
Hematocrit (HT): 28 % — ABNORMAL LOW (ref 40–52)
Hemoglobin (HGB) (HT): 9.2 g/dL — ABNORMAL LOW (ref 13.0–17.5)
MCHC (HT): 33 g/dL (ref 32.0–36.0)
MCV (HT): 91 fL (ref 81–99)
Mean Corpuscular Hemoglobin (MCH) (HT): 29.9 pg (ref 26.0–34.0)
Platelets (HT): 149 10 3/uL (ref 140–400)
RBC (HT): 3.08 10 6/uL — ABNORMAL LOW (ref 4.60–6.20)
RDW (HT): 18.4 % — ABNORMAL HIGH (ref 11.5–15.0)
WBC (HT): 5.5 10 3/uL (ref 4.0–10.0)

## 2021-11-10 LAB — MRSA NAAT (PCR): MRSA NAAT (PCR): NEGATIVE

## 2021-11-11 LAB — UNMAPPED LAB RESULTS
Hematocrit (HT): 28 % — ABNORMAL LOW (ref 40–52)
Hemoglobin (HGB) (HT): 8.8 g/dL — ABNORMAL LOW (ref 13.0–17.5)
MCHC (HT): 31.9 g/dL — ABNORMAL LOW (ref 32.0–36.0)
MCV (HT): 93 fL (ref 81–99)
Mean Corpuscular Hemoglobin (MCH) (HT): 29.7 pg (ref 26.0–34.0)
Platelets (HT): 138 10 3/uL — ABNORMAL LOW (ref 140–400)
RBC (HT): 2.96 10 6/uL — ABNORMAL LOW (ref 4.60–6.20)
RDW (HT): 18.6 % — ABNORMAL HIGH (ref 11.5–15.0)
WBC (HT): 6 10 3/uL (ref 4.0–10.0)

## 2021-11-12 LAB — UNMAPPED LAB RESULTS
Hematocrit (HT): 25 % — ABNORMAL LOW (ref 40–52)
Hemoglobin (HGB) (HT): 7.9 g/dL — ABNORMAL LOW (ref 13.0–17.5)
MCHC (HT): 31.3 g/dL — ABNORMAL LOW (ref 32.0–36.0)
MCV (HT): 94 fL (ref 81–99)
Mean Corpuscular Hemoglobin (MCH) (HT): 29.5 pg (ref 26.0–34.0)
Platelets (HT): 136 10 3/uL — ABNORMAL LOW (ref 140–400)
RBC (HT): 2.68 10 6/uL — ABNORMAL LOW (ref 4.60–6.20)
RDW (HT): 18.9 % — ABNORMAL HIGH (ref 11.5–15.0)
WBC (HT): 5.6 10 3/uL (ref 4.0–10.0)

## 2021-11-13 LAB — UNMAPPED LAB RESULTS
Hematocrit (HT): 23 % — ABNORMAL LOW (ref 40–52)
Hematocrit (HT): 23 % — ABNORMAL LOW (ref 40–52)
Hematocrit (HT): 24 % — ABNORMAL LOW (ref 40–52)
Hemoglobin (HGB) (HT): 7.3 g/dL — ABNORMAL LOW (ref 13.0–17.5)
Hemoglobin (HGB) (HT): 7.4 g/dL — ABNORMAL LOW (ref 13.0–17.5)
Hemoglobin (HGB) (HT): 7.7 g/dL — ABNORMAL LOW (ref 13.0–17.5)
MCHC (HT): 32.5 g/dL (ref 32.0–36.0)
MCV (HT): 95 fL (ref 81–99)
Mean Corpuscular Hemoglobin (MCH) (HT): 31 pg (ref 26.0–34.0)
Platelets (HT): 140 10 3/uL (ref 140–400)
RBC (HT): 2.39 10 6/uL — ABNORMAL LOW (ref 4.60–6.20)
RDW (HT): 18.7 % — ABNORMAL HIGH (ref 11.5–15.0)
WBC (HT): 4.8 10 3/uL (ref 4.0–10.0)

## 2021-11-14 LAB — UNMAPPED LAB RESULTS
Hematocrit (HT): 24 % — ABNORMAL LOW (ref 40–52)
Hemoglobin (HGB) (HT): 7.9 g/dL — ABNORMAL LOW (ref 13.0–17.5)
MCHC (HT): 32.6 g/dL (ref 32.0–36.0)
MCV (HT): 94 fL (ref 81–99)
Mean Corpuscular Hemoglobin (MCH) (HT): 30.6 pg (ref 26.0–34.0)
Platelets (HT): 159 10 3/uL (ref 140–400)
RBC (HT): 2.58 10 6/uL — ABNORMAL LOW (ref 4.60–6.20)
RDW (HT): 19 % — ABNORMAL HIGH (ref 11.5–15.0)
WBC (HT): 4.8 10 3/uL (ref 4.0–10.0)

## 2021-11-15 LAB — UNMAPPED LAB RESULTS
Hematocrit (HT): 25 % — ABNORMAL LOW (ref 40–52)
Hemoglobin (HGB) (HT): 8.1 g/dL — ABNORMAL LOW (ref 13.0–17.5)
MCHC (HT): 32.5 g/dL (ref 32.0–36.0)
MCV (HT): 94 fL (ref 81–99)
Mean Corpuscular Hemoglobin (MCH) (HT): 30.6 pg (ref 26.0–34.0)
Platelets (HT): 186 10 3/uL (ref 140–400)
RBC (HT): 2.65 10 6/uL — ABNORMAL LOW (ref 4.60–6.20)
RDW (HT): 19.1 % — ABNORMAL HIGH (ref 11.5–15.0)
WBC (HT): 5.9 10 3/uL (ref 4.0–10.0)

## 2021-11-17 LAB — UNMAPPED LAB RESULTS
Hematocrit (HT): 24 % — ABNORMAL LOW (ref 40–52)
Hemoglobin (HGB) (HT): 7.5 g/dL — ABNORMAL LOW (ref 13.0–17.5)
MCHC (HT): 31.9 g/dL — ABNORMAL LOW (ref 32.0–36.0)
MCV (HT): 96 fL (ref 81–99)
Mean Corpuscular Hemoglobin (MCH) (HT): 30.6 pg (ref 26.0–34.0)
Platelets (HT): 234 10 3/uL (ref 140–400)
RBC (HT): 2.45 10 6/uL — ABNORMAL LOW (ref 4.60–6.20)
RDW (HT): 18.5 % — ABNORMAL HIGH (ref 11.5–15.0)
WBC (HT): 5.2 10 3/uL (ref 4.0–10.0)

## 2021-11-25 LAB — UNMAPPED LAB RESULTS
Hematocrit (HT): 27 % — ABNORMAL LOW (ref 40–52)
Hemoglobin (HGB) (HT): 8.9 g/dL — ABNORMAL LOW (ref 13.0–17.5)

## 2021-12-27 ENCOUNTER — Ambulatory Visit (HOSPITAL_COMMUNITY): Payer: Medicare Other | Admitting: Physician Assistant

## 2022-01-17 ENCOUNTER — Institutional Professional Consult (permissible substitution): Payer: Medicare Other | Admitting: Plastic Surgery

## 2022-01-19 ENCOUNTER — Telehealth: Payer: Self-pay

## 2022-01-19 NOTE — Telephone Encounter (Signed)
    Primary Cardiologist: Quay Burow, MD  Chart reviewed as part of pre-operative protocol coverage. Simple dental extractions are considered low risk procedures per guidelines and generally do not require any specific cardiac clearance. It is also generally accepted that for simple extractions and dental cleanings, there is no need to interrupt blood thinner therapy.   SBE prophylaxis is not required for the patient.  I will route this recommendation to the requesting party via Epic fax function and remove from pre-op pool.  Please call with questions.  Deberah Pelton, NP 01/19/2022, 12:30 PM

## 2022-01-19 NOTE — Telephone Encounter (Signed)
   Pre-operative Risk Assessment    Patient Name: [name]  DOB: September 03, 1945 MRN: [id number]     Request for Surgical Clearance    Procedure:  Surgical removal of a tooth  Date of Surgery:  Clearance TBD                                 Surgeon:  Barbarann Ehlers, DDS Surgeon's Group or Practice Name:  [facility] Phone number:  [phone] Fax number:  [phone]   Type of Clearance Requested:   - Pharmacy:  Hold Apixaban (Eliquis) how many days to hold and restart . Are there any additional medical concerns/recommendations?   Type of Anesthesia:  Local    Additional requests/questions:  Please fax a copy of clearance report to fax  [phone] to the surgeon's office.  Signed, Jeanmarie Plant Damiano Stamper  CCma 01/19/2022, 11:34 AM

## 2022-04-12 ENCOUNTER — Ambulatory Visit: Payer: Medicare Other | Attending: Cardiovascular Disease | Admitting: Cardiovascular Disease

## 2022-04-12 ENCOUNTER — Encounter: Payer: Self-pay | Admitting: Cardiovascular Disease

## 2022-04-12 VITALS — BP 110/54 | HR 60 | Ht 73.0 in | Wt 211.0 lb

## 2022-04-12 DIAGNOSIS — E785 Hyperlipidemia, unspecified: Secondary | ICD-10-CM | POA: Diagnosis present

## 2022-04-12 DIAGNOSIS — I4811 Longstanding persistent atrial fibrillation: Secondary | ICD-10-CM | POA: Diagnosis present

## 2022-04-12 DIAGNOSIS — R600 Localized edema: Secondary | ICD-10-CM | POA: Diagnosis present

## 2022-04-12 DIAGNOSIS — R601 Generalized edema: Secondary | ICD-10-CM | POA: Diagnosis present

## 2022-04-12 MED ORDER — FUROSEMIDE 20 MG PO TABS: 40.0000 mg | ORAL_TABLET | Freq: Every day | ORAL | 3 refills | Status: AC

## 2022-04-12 NOTE — Patient Instructions (Addendum)
Medication Instructions:   START FUROSEMIDE 40 MG ONCE DAILY= 2 OF THE 20 MG TABLETS ONCE DAILY  *If you need a refill on your cardiac medications before your next appointment, please call your pharmacy*   Lab Work:  Your physician recommends that you return for lab work in: 10 DAYS=04/21/22=FASTING  If you have labs (blood work) drawn today and your tests are completely normal, you will receive your results only by: [place] (if you have MyChart) OR A paper copy in the mail If you have any lab test that is abnormal or we need to change your treatment, we will call you to review the results.   Testing/Procedures:  Your physician has requested that you have an echocardiogram. Echocardiography is a painless test that uses sound waves to create images of your heart. It provides your doctor with information about the size and shape of your heart and how well your heart's chambers and valves are working. This procedure takes approximately one hour. There are no restrictions for this procedure. Please do NOT wear cologne, perfume, aftershave, or lotions (deodorant is allowed). Please arrive 15 minutes prior to your appointment time. [place], you and your health needs are our priority.  As part of our continuing mission to provide you with exceptional heart care, we have created designated Provider Care Teams.  These Care Teams include your primary Cardiologist (physician) and Advanced Practice Providers (APPs -  Physician Assistants and Nurse Practitioners) who all work together to provide you with the care you need, when you need it.  We recommend signing up for the patient portal called "MyChart".  Sign up information is provided on this After Visit Summary.  MyChart is used to connect with patients for Virtual Visits (Telemedicine).  Patients are able to view lab/test results, encounter notes, upcoming appointments, etc.  Non-urgent  messages can be sent to your provider as well.   To learn more about what you can do with MyChart, go to [email or web address].    Your next appointment:   3 month(s)  The format for your next appointment:   In Person  Provider:   Quay Burow, MD

## 2022-04-12 NOTE — Assessment & Plan Note (Signed)
History of persistent atrial fibrillation rate controlled on Eliquis oral anticoagulation. 

## 2022-04-12 NOTE — Progress Notes (Signed)
04/12/2022 [name]   07-06-1945  [id number]  Primary Physician Administration, [facility] Primary Cardiologist: Lorretta Harp MD FACP, [place], [place], [place]  HPI:  [name] is a 77 y.o.  mildly overweight married Caucasian male father of 2, grandfather of 1 grandchild who I initially saw in consultation 02/04/2019 when he was admitted with diastolic heart failure and A. fib with RVR.  I last saw him in the office 01/11/2021.  He is retired from the [employer].  He does not smoke.  He drinks daily but only 2 beers a night and occasional wine with dinner.  He has no history of diabetes, hypertension or hyperlipidemia.  Never had a heart attack or stroke.  There is no family's for heart disease.  He is fairly active.  He did play division 3 football and hockey in his younger years.  He is unaware that he is in A. fib at this point.     Since I saw him in the office 1 year ago he continues to do well.  He is very active.  He denies chest pain or shortness of breath.  He remains in atrial fibrillation with controlled controlled ventricular sponsor on Eliquis.  Unfortunately, he broke his right hip during a fishing trip up in [place] in August and had ORIF and is currently recovering from this.  He does complain of bilateral lower extremity edema.  Current Meds  Medication Sig   allopurinol (ZYLOPRIM) 300 MG tablet Take 600 mg by mouth every morning.   apixaban (ELIQUIS) 5 MG TABS tablet Take 1 tablet (5 mg total) by mouth 2 (two) times daily.   CALCIUM 600 1500 (600 Ca) MG TABS tablet Take 600 mg of elemental calcium by mouth 2 (two) times daily.    Cholecalciferol (VITAMIN D3 PO) Take 1 tablet by mouth daily.   clotrimazole (LOTRIMIN) 1 % cream Apply 1 application. topically daily.   famotidine (PEPCID) 20 MG tablet Take 1 tablet by mouth 2 (two) times daily.   gabapentin (NEURONTIN) 100 MG capsule 200 mg 2 (two) times daily. Noon and midnight   magnesium oxide (MAG-OX) 400  MG tablet Take 400 mg by mouth 2 (two) times daily.   metoprolol tartrate (LOPRESSOR) 25 MG tablet Take 1 tablet (25 mg total) by mouth 2 (two) times daily. Hold SYSTOLIC BP <937, HR <16   morphine (MS CONTIN) 15 MG 12 hr tablet Take 15 mg by mouth every 12 (twelve) hours. Noon and midnight   Multiple Vitamin (MULTIVITAMIN WITH MINERALS) TABS tablet Take 1 tablet by mouth daily.   oxyCODONE (OXY IR/ROXICODONE) 5 MG immediate release tablet Take 1 tablet (5 mg total) by mouth every 4 (four) hours as needed for severe pain. In addition to current chronic pain medication (Patient taking differently: Take 10 mg by mouth every 3 (three) hours as needed for severe pain. In addition to current chronic pain medication)   PRESCRIPTION MEDICATION Take 1 capsule by mouth at bedtime. Green and white capsule from [facility]   sildenafil (VIAGRA) 100 MG tablet Take 100 mg by mouth daily as needed for erectile dysfunction.   tamsulosin (FLOMAX) 0.4 MG CAPS capsule Take 0.4 mg by mouth 2 (two) times daily.   Testosterone 20.25 MG/ACT (1.62%) GEL Apply 20.25 mg topically daily. Apply to shoulders   vitamin B-12 (CYANOCOBALAMIN) 1000 MCG tablet Take 1,000 mcg by mouth daily.   [DISCONTINUED] furosemide (LASIX) 20 MG tablet Take 20 mg by mouth as needed.  No Known Allergies  Social History   Socioeconomic History   Marital status: Married    Spouse name: Not on file   Number of children: Not on file   Years of education: Not on file   Highest education level: Not on file  Occupational History   Not on file  Tobacco Use   Smoking status: Never   Smokeless tobacco: Never  Vaping Use   Vaping Use: Never used  Substance and Sexual Activity   Alcohol use: Yes    Alcohol/week: 28.0 standard drinks of alcohol    Types: 14 Glasses of wine, 14 Cans of beer per week    Comment: daily   Drug use: No   Sexual activity: Not on file  Other Topics Concern   Not on file  Social History Narrative   Not on file    Social Determinants of Health   Financial Resource Strain: Not on file  Food Insecurity: Not on file  Transportation Needs: Not on file  Physical Activity: Not on file  Stress: Not on file  Social Connections: Not on file  Intimate Partner Violence: Not on file     Review of Systems: General: negative for chills, fever, night sweats or weight changes.  Cardiovascular: negative for chest pain, dyspnea on exertion, edema, orthopnea, palpitations, paroxysmal nocturnal dyspnea or shortness of breath Dermatological: negative for rash Respiratory: negative for cough or wheezing Urologic: negative for hematuria Abdominal: negative for nausea, vomiting, diarrhea, bright red blood per rectum, melena, or hematemesis Neurologic: negative for visual changes, syncope, or dizziness All other systems reviewed and are otherwise negative except as noted above.    Blood pressure (!) 110/54, pulse 60, height '6\' 1"'$  (1.854 m), weight 211 lb (95.7 kg).  General appearance: alert and no distress Neck: no adenopathy, no carotid bruit, no JVD, supple, symmetrical, trachea midline, and thyroid not enlarged, symmetric, no tenderness/mass/nodules Lungs: clear to auscultation bilaterally Heart: irregularly irregular rhythm Extremities: 2+ edema bilaterally Pulses: 2+ and symmetric Skin: Skin color, texture, turgor normal. No rashes or lesions Neurologic: Grossly normal  EKG atrial fibrillation with ventricular response of 60 with poor R wave progression.  Personally reviewed this EKG.  ASSESSMENT AND PLAN:   Atrial fibrillation ([place]) History of persistent atrial fibrillation rate controlled on Eliquis oral anticoagulation.  Lower extremity edema Bilateral lower extremity edema on furosemide 20 mg a day.  He had a 2D echo performed 10/01/2019 that showed mild global hypokinesia with an EF of 45 to 50% with moderate pulmonary hypertension.  I am going to increase his furosemide from 20 to 40 mg a day  and we will recheck a 2D echo cardiogram.  I suspect this is related to venous insufficiency.     Lorretta Harp MD FACP,FACC,FAHA, [facility] 04/12/2022 2:55 PM

## 2022-04-12 NOTE — Assessment & Plan Note (Signed)
Bilateral lower extremity edema on furosemide 20 mg a day.  He had a 2D echo performed 10/01/2019 that showed mild global hypokinesia with an EF of 45 to 50% with moderate pulmonary hypertension.  I am going to increase his furosemide from 20 to 40 mg a day and we will recheck a 2D echo cardiogram.  I suspect this is related to venous insufficiency.

## 2022-04-20 ENCOUNTER — Telehealth: Payer: Self-pay | Admitting: Cardiovascular Disease

## 2022-04-20 NOTE — Telephone Encounter (Signed)
Spoke to patient he stated he had a echo done 11/01/21 at [facility] in [place].He will call and have report faxed to [facility] at fax # [phone].He will keep echo appointment at our [facility] office scheduled 05/04/22.Advised I will make Dr.Berry's RN aware.

## 2022-04-20 NOTE — Telephone Encounter (Signed)
Patient states he had an echo with another office in August, 2023. He would like to know whether it is necessary to have another echo so soon. He is currently scheduled for 2/01. Please advise.

## 2022-04-21 LAB — LIPID PANEL
Chol/HDL Ratio: 2.2 ratio (ref 0.0–5.0)
Cholesterol, Total: 132 mg/dL (ref 100–199)
HDL: 60 mg/dL (ref >39)
LDL Chol Calc (NIH): 61 mg/dL (ref 0–99)
Triglycerides: 46 mg/dL (ref 0–149)
VLDL Cholesterol Cal: 11 mg/dL (ref 5–40)

## 2022-04-21 LAB — COMPREHENSIVE METABOLIC PANEL
ALT: 6 IU/L (ref 0–44)
AST: 17 IU/L (ref 0–40)
Albumin/Globulin Ratio: 1.2 (ref 1.2–2.2)
Albumin: 3.6 g/dL — ABNORMAL LOW (ref 3.8–4.8)
Alkaline Phosphatase: 109 IU/L (ref 44–121)
BUN/Creatinine Ratio: 25 — ABNORMAL HIGH (ref 10–24)
BUN: 18 mg/dL (ref 8–27)
Bilirubin Total: 0.7 mg/dL (ref 0.0–1.2)
CO2: 27 mmol/L (ref 20–29)
Calcium: 8.7 mg/dL (ref 8.6–10.2)
Chloride: 102 mmol/L (ref 96–106)
Creatinine, Ser: 0.72 mg/dL — ABNORMAL LOW (ref 0.76–1.27)
Globulin, Total: 2.9 g/dL (ref 1.5–4.5)
Glucose: 97 mg/dL (ref 70–99)
Potassium: 4.4 mmol/L (ref 3.5–5.2)
Sodium: 144 mmol/L (ref 134–144)
Total Protein: 6.5 g/dL (ref 6.0–8.5)
eGFR: 95 mL/min/{1.73_m2} (ref >59)

## 2022-05-02 ENCOUNTER — Encounter: Payer: Self-pay | Admitting: Orthopaedic Surgery

## 2022-05-02 ENCOUNTER — Other Ambulatory Visit: Payer: Self-pay | Admitting: Orthopaedic Surgery

## 2022-05-02 DIAGNOSIS — M25511 Pain in right shoulder: Secondary | ICD-10-CM

## 2022-05-04 ENCOUNTER — Ambulatory Visit (HOSPITAL_COMMUNITY): Payer: Medicare Other | Attending: Cardiovascular Disease

## 2022-05-04 DIAGNOSIS — R601 Generalized edema: Secondary | ICD-10-CM

## 2022-05-04 LAB — ECHOCARDIOGRAM COMPLETE
Area-P 1/2: 3.17 cm2
S' Lateral: 3.9 cm

## 2022-05-05 ENCOUNTER — Other Ambulatory Visit: Payer: Self-pay

## 2022-05-05 DIAGNOSIS — I480 Paroxysmal atrial fibrillation: Secondary | ICD-10-CM

## 2022-05-05 DIAGNOSIS — I5043 Acute on chronic combined systolic (congestive) and diastolic (congestive) heart failure: Secondary | ICD-10-CM

## 2022-05-08 ENCOUNTER — Ambulatory Visit
Admission: RE | Admit: 2022-05-08 | Discharge: 2022-05-08 | Disposition: A | Discharge status: Home / Self Care | Payer: Medicare Other | Source: Ambulatory Visit | Attending: Orthopaedic Surgery | Admitting: Orthopaedic Surgery

## 2022-05-08 DIAGNOSIS — M25511 Pain in right shoulder: Secondary | ICD-10-CM

## 2022-06-28 ENCOUNTER — Encounter: Payer: Self-pay | Admitting: Podiatry

## 2022-06-28 ENCOUNTER — Ambulatory Visit (INDEPENDENT_AMBULATORY_CARE_PROVIDER_SITE_OTHER): Payer: Medicare Other

## 2022-06-28 ENCOUNTER — Ambulatory Visit (INDEPENDENT_AMBULATORY_CARE_PROVIDER_SITE_OTHER): Payer: Medicare Other | Admitting: Podiatry

## 2022-06-28 DIAGNOSIS — S90851A Superficial foreign body, right foot, initial encounter: Secondary | ICD-10-CM

## 2022-06-28 DIAGNOSIS — B351 Tinea unguium: Secondary | ICD-10-CM | POA: Diagnosis not present

## 2022-06-28 NOTE — Progress Notes (Signed)
Subjective:   Patient ID: [name], male   DOB: 77 y.o.   MRN: [id number]   HPI Patient presents concerned about swelling of his feet the possibility of a foreign body plantar right with no pain and nail disease that is hard for him to take care of of and see.  Patient does not smoke   ROS      Objective:  Physical Exam  Neurovascular status intact muscle strength found to be adequate range of motion adequate with slight nail disease and natural swelling of both feet with no pain currently with some discoloration secondary to varicosities     Assessment:  Possibility for foreign body right based on previous x-rays but nontender and nail disease and vein disease     Plan:  H&P all conditions reviewed x-rays reviewed and do not recommend removal of any foreign body at this time as it does not change in size and is not painful it would be almost impossible to find.  May be calcification and I did try to make him feel better about having something in his foot but I do not see this is being pathological  X-rays indicate very small calcifications in the midfoot right and into the heel but nothing that appears to be invasive or has grown in size from x-rays compared to 2022

## 2022-07-19 ENCOUNTER — Encounter: Payer: Self-pay | Admitting: Cardiovascular Disease

## 2022-07-19 ENCOUNTER — Ambulatory Visit: Payer: Medicare Other | Attending: Cardiovascular Disease | Admitting: Cardiovascular Disease

## 2022-07-19 VITALS — BP 124/62 | HR 50 | Ht 73.0 in | Wt 199.4 lb

## 2022-07-19 DIAGNOSIS — I4891 Unspecified atrial fibrillation: Secondary | ICD-10-CM | POA: Insufficient documentation

## 2022-07-19 DIAGNOSIS — I272 Pulmonary hypertension, unspecified: Secondary | ICD-10-CM | POA: Insufficient documentation

## 2022-07-19 DIAGNOSIS — I5043 Acute on chronic combined systolic (congestive) and diastolic (congestive) heart failure: Secondary | ICD-10-CM | POA: Insufficient documentation

## 2022-07-19 DIAGNOSIS — R600 Localized edema: Secondary | ICD-10-CM | POA: Diagnosis present

## 2022-07-19 NOTE — Assessment & Plan Note (Signed)
History of persistent A-fib rate controlled on Eliquis oral anticoagulation. 

## 2022-07-19 NOTE — Progress Notes (Signed)
07/19/2022 Tacy Dura   June 11, 1945  [id number]  Primary Physician Administration, [facility] Primary Cardiologist: Runell Gess MD Milagros Loll, [place], [place]  HPI:  [name] is a 77 y.o.  mildly overweight married Caucasian male father of 2, grandfather of 1 grandchild who I initially saw in consultation 02/04/2019 when he was admitted with diastolic heart failure and A. fib with RVR.  I last saw him in the office 04/12/2022.  He is retired from the [employer].  He does not smoke.  He drinks daily but only 2 beers a night and occasional wine with dinner.  He has no history of diabetes, hypertension or hyperlipidemia.  Never had a heart attack or stroke.  There is no family's for heart disease.  He is fairly active.  He did play division 3 football and hockey in his younger years.  He is unaware that he is in A. fib at this point.     Unfortunately, he broke his right hip during a fishing trip up in [place] in August 2023 and had ORIF and is currently recovering from this.  He does complain of bilateral lower extremity edema.  Since I saw him 3 months ago he continues to recuperate from his hip fracture and replacement.  He did have significant lower extremity edema which is resolved with furosemide.  He now has trace lower extremity edema with his weight being down 12 pounds.  His 2D echo showed normal LV systolic function, severe biatrial enlargement with moderate pulmonary hypertension unchanged from prior echoes however his RV size and function have changed now with moderate dilatation and hypokinesia.   Current Meds  Medication Sig   allopurinol (ZYLOPRIM) 300 MG tablet Take 600 mg by mouth every morning.   apixaban (ELIQUIS) 5 MG TABS tablet Take 1 tablet (5 mg total) by mouth 2 (two) times daily.   CALCIUM 600 1500 (600 Ca) MG TABS tablet Take 600 mg of elemental calcium by mouth 2 (two) times daily.    Cholecalciferol (VITAMIN D3 PO) Take 1 tablet by mouth daily.    clotrimazole (LOTRIMIN) 1 % cream Apply 1 application. topically daily.   famotidine (PEPCID) 20 MG tablet Take 1 tablet by mouth 2 (two) times daily.   furosemide (LASIX) 20 MG tablet Take 2 tablets (40 mg total) by mouth daily.   gabapentin (NEURONTIN) 100 MG capsule 200 mg 2 (two) times daily. Noon and midnight   magnesium oxide (MAG-OX) 400 MG tablet Take 400 mg by mouth 2 (two) times daily.   metoprolol tartrate (LOPRESSOR) 25 MG tablet Take 1 tablet (25 mg total) by mouth 2 (two) times daily. Hold SYSTOLIC BP <100, HR <60   morphine (MS CONTIN) 15 MG 12 hr tablet Take 15 mg by mouth every 12 (twelve) hours. Noon and midnight   Multiple Vitamin (MULTIVITAMIN WITH MINERALS) TABS tablet Take 1 tablet by mouth daily.   oxyCODONE (OXY IR/ROXICODONE) 5 MG immediate release tablet Take 1 tablet (5 mg total) by mouth every 4 (four) hours as needed for severe pain. In addition to current chronic pain medication (Patient taking differently: Take 10 mg by mouth every 3 (three) hours as needed for severe pain. In addition to current chronic pain medication)   pantoprazole (PROTONIX) 40 MG tablet Take 1 tablet (40 mg total) by mouth 2 (two) times daily. Pantoprazole 40 mg two times daily x 8 weeks, then  once daily   PRESCRIPTION MEDICATION Take 1 capsule by mouth  at bedtime. Green and white capsule from [facility]   sildenafil (VIAGRA) 100 MG tablet Take 100 mg by mouth daily as needed for erectile dysfunction.   tamsulosin (FLOMAX) 0.4 MG CAPS capsule Take 0.4 mg by mouth 2 (two) times daily.   Testosterone 20.25 MG/ACT (1.62%) GEL Apply 20.25 mg topically daily. Apply to shoulders   vitamin B-12 (CYANOCOBALAMIN) 1000 MCG tablet Take 1,000 mcg by mouth daily.     No Known Allergies  Social History   Socioeconomic History   Marital status: Married    Spouse name: Not on file   Number of children: Not on file   Years of education: Not on file   Highest education level: Not on file  Occupational  History   Not on file  Tobacco Use   Smoking status: Never   Smokeless tobacco: Never  Vaping Use   Vaping Use: Never used  Substance and Sexual Activity   Alcohol use: Yes    Alcohol/week: 28.0 standard drinks of alcohol    Types: 14 Glasses of wine, 14 Cans of beer per week    Comment: daily   Drug use: No   Sexual activity: Not on file  Other Topics Concern   Not on file  Social History Narrative   Not on file   Social Determinants of Health   Financial Resource Strain: Not on file  Food Insecurity: Not on file  Transportation Needs: Not on file  Physical Activity: Not on file  Stress: Not on file  Social Connections: Not on file  Intimate Partner Violence: Not on file     Review of Systems: General: negative for chills, fever, night sweats or weight changes.  Cardiovascular: negative for chest pain, dyspnea on exertion, edema, orthopnea, palpitations, paroxysmal nocturnal dyspnea or shortness of breath Dermatological: negative for rash Respiratory: negative for cough or wheezing Urologic: negative for hematuria Abdominal: negative for nausea, vomiting, diarrhea, bright red blood per rectum, melena, or hematemesis Neurologic: negative for visual changes, syncope, or dizziness All other systems reviewed and are otherwise negative except as noted above.    Blood pressure 124/62, pulse (!) 50, height 6\' 1"  (1.854 m), weight 199 lb 6.4 oz (90.4 kg), SpO2 98 %.  General appearance: alert and no distress Neck: no adenopathy, no carotid bruit, no JVD, supple, symmetrical, trachea midline, and thyroid not enlarged, symmetric, no tenderness/mass/nodules Lungs: clear to auscultation bilaterally Heart: irregularly irregular rhythm Extremities: Trace to 1+ bilateral lower extremity edema Pulses: 2+ and symmetric Skin: Venous stasis changes bilaterally Neurologic: Grossly normal  EKG atrial flutter with variable ventricular response, right axis deviation.  I personally  reviewed this EKG.  ASSESSMENT AND PLAN:   Atrial fibrillation with RVR (HCC) History of persistent A-fib rate controlled on Eliquis oral anticoagulation.  Acute on chronic combined systolic and diastolic CHF (congestive heart failure) (HCC) History of diastolic heart failure on furosemide.  Lower extremity edema History of bilateral lower extremity edema improved since I saw him 4 months ago on furosemide.  He is lost 11 pounds.  Pulmonary hypertension, unspecified Mr. Lewie Chamber has had moderate pulm hypertension over the last several echocardiograms with RV systolic pressures in the 50 range.  Recent echo 2 years ago showed trivial TR with normal RV size and function however his most recent echo performed 05/04/2022 showed a severely dilated right atrium, moderate TR with an enlarging hypokinetic right ventricle for unclear reasons.  He is basically asymptomatic.  I am referring him to the advanced heart failure clinic to  help evaluate the etiology.     Runell Gess MD FACP,FACC,FAHA, [facility] 07/19/2022 2:12 PM

## 2022-07-19 NOTE — Assessment & Plan Note (Signed)
History of bilateral lower extremity edema improved since I saw him 4 months ago on furosemide.  He is lost 11 pounds.

## 2022-07-19 NOTE — Assessment & Plan Note (Signed)
Mr. [name] has had moderate pulm hypertension over the last several echocardiograms with RV systolic pressures in the 50 range.  Recent echo 2 years ago showed trivial TR with normal RV size and function however his most recent echo performed 05/04/2022 showed a severely dilated right atrium, moderate TR with an enlarging hypokinetic right ventricle for unclear reasons.  He is basically asymptomatic.  I am referring him to the advanced heart failure clinic to help evaluate the etiology.

## 2022-07-19 NOTE — Patient Instructions (Signed)
Medication Instructions:  Your physician recommends that you continue on your current medications as directed. Please refer to the Current Medication list given to you today.  *If you need a refill on your cardiac medications before your next appointment, please call your pharmacy*   Follow-Up: At [facility], you and your health needs are our priority.  As part of our continuing mission to provide you with exceptional heart care, we have created designated Provider Care Teams.  These Care Teams include your primary Cardiologist (physician) and Advanced Practice Providers (APPs -  Physician Assistants and Nurse Practitioners) who all work together to provide you with the care you need, when you need it.  We recommend signing up for the patient portal called "MyChart".  Sign up information is provided on this After Visit Summary.  MyChart is used to connect with patients for Virtual Visits (Telemedicine).  Patients are able to view lab/test results, encounter notes, upcoming appointments, etc.  Non-urgent messages can be sent to your provider as well.   To learn more about what you can do with MyChart, go to [email or web address].    Your next appointment:   6 month(s)  Provider:   Angela Duke, PA-C, Callie Goodrich, PA-C, Jennifer, Lambert, PA-C, Kathryn Lawrence, DNP, ANP, Hao Meng, PA-C, Emily Monge, NP, or Deborah Wittenborn, NP      Then, Jonathan Berry, MD will plan to see you again in 12 month(s).   

## 2022-07-19 NOTE — Assessment & Plan Note (Signed)
History of diastolic heart failure on furosemide.

## 2022-08-22 ENCOUNTER — Ambulatory Visit (INDEPENDENT_AMBULATORY_CARE_PROVIDER_SITE_OTHER): Payer: Medicare Other | Admitting: Plastic Surgery

## 2022-08-22 ENCOUNTER — Encounter: Payer: Self-pay | Admitting: Plastic Surgery

## 2022-08-22 VITALS — BP 115/54 | HR 61 | Ht 73.0 in | Wt 209.4 lb

## 2022-08-22 DIAGNOSIS — H57813 Brow ptosis, bilateral: Secondary | ICD-10-CM

## 2022-08-22 DIAGNOSIS — H02831 Dermatochalasis of right upper eyelid: Secondary | ICD-10-CM

## 2022-08-22 DIAGNOSIS — I4891 Unspecified atrial fibrillation: Secondary | ICD-10-CM | POA: Diagnosis not present

## 2022-08-22 DIAGNOSIS — H02839 Dermatochalasis of unspecified eye, unspecified eyelid: Secondary | ICD-10-CM | POA: Insufficient documentation

## 2022-08-22 DIAGNOSIS — H02834 Dermatochalasis of left upper eyelid: Secondary | ICD-10-CM

## 2022-08-22 NOTE — Progress Notes (Signed)
   Subjective:    Patient ID: [name], male    DOB: 1945/11/17, 77 y.o.   MRN: [id number]  The patient is a 77 year old male here for evaluation of his upper lids.  He was seen previously for dermatochalasis but had issue with his hip and a fracture so that was put on hold.  Had a visual field exam at the [place] and we are trying to find those results.  There from 2 years ago so we may need to redo it he is willing to do that and we have made a referral.  He finds that he has decreased field of vision per throughout the day and worse in the evening.  He has hooding and obstruction from the temporal visual fields.  He has excess skin resting on his brows and likely this is made worse by his forehead ptosis.    Review of Systems  Constitutional:  Positive for activity change. Negative for appetite change.  HENT: Negative.    Eyes:  Positive for visual disturbance.  Respiratory: Negative.    Cardiovascular: Negative.   Gastrointestinal: Negative.   Endocrine: Negative.   Genitourinary: Negative.   Musculoskeletal: Negative.        Objective:   Physical Exam Vitals and nursing note reviewed.  HENT:     Head: Normocephalic and atraumatic.  Cardiovascular:     Rate and Rhythm: Normal rate.     Pulses: Normal pulses.  Pulmonary:     Effort: Pulmonary effort is normal.  Skin:    General: Skin is warm.     Capillary Refill: Capillary refill takes less than 2 seconds.     Coloration: Skin is not jaundiced.     Findings: Bruising present. No lesion.  Neurological:     Mental Status: He is alert and oriented to person, place, and time.  Psychiatric:        Mood and Affect: Mood normal.        Behavior: Behavior normal.        Thought Content: Thought content normal.        Judgment: Judgment normal.         Assessment & Plan:     ICD-10-CM   1. Atrial fibrillation with RVR (HCC)  I48.91     2. Brow ptosis, bilateral  H57.813     3. Dermatochalasis of both upper eyelids   H02.831    H02.834       In for another visual field and then a virtual visit with me in a month so we can make sure he does not get lost in the system.  Pictures were obtained of the patient and placed in the chart with the patient's or guardian's permission.

## 2022-08-24 ENCOUNTER — Telehealth: Payer: Self-pay

## 2022-08-24 NOTE — Telephone Encounter (Signed)
Pt has been scheduled for July 2 at 2:20 with Dr. Arnetha Gula. L/m for pt with details.

## 2022-08-29 ENCOUNTER — Telehealth: Payer: Self-pay | Admitting: Plastic Surgery

## 2022-08-29 NOTE — Telephone Encounter (Signed)
Pt waiting on referral call to set up for field vision view test for [facility].

## 2022-08-30 ENCOUNTER — Telehealth: Payer: Self-pay | Admitting: Cardiovascular Disease

## 2022-08-30 NOTE — Telephone Encounter (Signed)
Pt c/o medication issue:  1. Name of Medication: apixaban (ELIQUIS) 5 MG TABS tablet   2. How are you currently taking this medication (dosage and times per day)? As prescribed   3. Are you having a reaction (difficulty breathing--STAT)? Yes  4. What is your medication issue? Wanting to discuss being put on a lower dosage due to excessive bruising and skin easily peeling back causing bleeding when bumping into anything.

## 2022-08-30 NOTE — Telephone Encounter (Signed)
Patient states "my arms are purple". States if he bumps anything even slightly, his skin will tear and bleed.  Advised skin becomes thinner as we age. Did advise for protection of arms, wearing long sleeves, or arm protectors wether those for tattoos or those made for geriatric.   He ask if he can stop Eliquis or go on a lower dose, due to tearing and bleeding.  Advised I would ask provider since he is aware of patient history and need for the medication.  Please advise

## 2022-08-30 NOTE — Telephone Encounter (Signed)
Patient is aware he can not stop Eliquis or lower dose. He did ask about when he have jis knee surgery and dental surgery. Informed him that once we receive a clearance form from the surgeon. The per-op team will give him a call to complete the form and determine if he will need an in office visit but he will get all instructions on what medications to stop and when to restart them.

## 2022-08-31 ENCOUNTER — Telehealth: Payer: Self-pay

## 2022-08-31 ENCOUNTER — Other Ambulatory Visit: Payer: Self-pay

## 2022-08-31 ENCOUNTER — Telehealth: Payer: Self-pay | Admitting: Plastic Surgery

## 2022-08-31 DIAGNOSIS — H57813 Brow ptosis, bilateral: Secondary | ICD-10-CM

## 2022-08-31 DIAGNOSIS — H02831 Dermatochalasis of right upper eyelid: Secondary | ICD-10-CM

## 2022-08-31 NOTE — Telephone Encounter (Signed)
Faxed referral to [facility] so that they will have it for his appointment. Confirmed receipt.

## 2022-08-31 NOTE — Telephone Encounter (Signed)
Pt called and stated that the earliest time to get in is 10-02-22, is that ok to wait or should he try somewhere else for vision test

## 2022-09-04 ENCOUNTER — Telehealth: Payer: Self-pay | Admitting: *Deleted

## 2022-09-04 NOTE — Telephone Encounter (Signed)
   Pre-operative Risk Assessment    Patient Name: [name]  DOB: Feb 14, 1946 MRN: [id number]     Request for Surgical Clearance    Procedure:   REMOVAL OF 1 TOOTH (SURGICAL) WITH BONE GRAFTING FOR POSSIBLE IMPLANT IN THE FUTURE  Date of Surgery:  Clearance TBD                                 Surgeon:  DR. Graylon Gunning, DDS Surgeon's Group or Practice Name:  [facility] Phone number:  [phone] Fax number:  [phone]   Type of Clearance Requested:   - Medical  - Pharmacy:  Hold Apixaban (Eliquis) HOW LONG TO HOLD AND WHEN TO RESUME    Type of Anesthesia:  Local    Additional requests/questions:    [name]   09/04/2022, 11:33 AM

## 2022-09-04 NOTE — Telephone Encounter (Signed)
Please advise holding Eliquis prior to tooth extraction with bone grafting.   Thank you!  DW

## 2022-09-04 NOTE — Telephone Encounter (Signed)
Patient with diagnosis of atrial fibrillation on Eliquis for anticoagulation.    Procedure:   REMOVAL OF 1 TOOTH (SURGICAL) WITH BONE GRAFTING FOR POSSIBLE IMPLANT IN THE FUTURE   Date of Surgery:  Clearance TBD   CHA2DS2-VASc Score = 3   This indicates a 3.2% annual risk of stroke. The patient's score is based upon: CHF History: 1 HTN History: 0 Diabetes History: 0 Stroke History: 0 Vascular Disease History: 0 Age Score: 2 Gender Score: 0    CrCl 115 Platelet count 234  Patient does not require pre-op antibiotics for dental procedure.  Per office protocol, patient can hold Eliquis for 1 days prior to procedure.   Patient will not need bridging with Lovenox (enoxaparin) around procedure.  **This guidance is not considered finalized until pre-operative APP has relayed final recommendations.**

## 2022-09-05 NOTE — Telephone Encounter (Signed)
Dr. Allyson Sabal,  You saw this patient on 07/19/2022. You referred him to advanced heart failure clinic secondary to "his most recent echo performed 05/04/2022 showed a severely dilated right atrium, moderate TR with an enlarging hypokinetic right ventricle for unclear reasons." He is pending surgical tooth extraction with bone grafting under local anesthesia. I believe he is okay to proceed but I wanted to make sure you were okay with him undergoing this procedure prior to seeing advanced heart failure clinic.   Please route your response to P CV DIV Preop. I will communicate with requesting office once you have given recommendations.   Thank you!  Carlos Levering, NP

## 2022-09-05 NOTE — Telephone Encounter (Signed)
   Name: [name]  DOB: May 30, 1945  MRN: [id number]   Primary Cardiologist: Nanetta Batty, MD  Chart reviewed as part of pre-operative protocol coverage. [name] was last seen on 07/19/2022 by Dr. Allyson Sabal.  Per Dr. Allyson Sabal okay to proceed with dental procedure under local anesthesia.   Therefore, based on ACC/AHA guidelines, the patient would be at acceptable risk for the planned procedure without further cardiovascular testing.   Per pharm D: Patient with diagnosis of atrial fibrillation on Eliquis for anticoagulation.     Procedure:   REMOVAL OF 1 TOOTH (SURGICAL) WITH BONE GRAFTING FOR POSSIBLE IMPLANT IN THE FUTURE   Date of Surgery:  Clearance TBD     CHA2DS2-VASc Score = 3   This indicates a 3.2% annual risk of stroke. The patient's score is based upon: CHF History: 1 HTN History: 0 Diabetes History: 0 Stroke History: 0 Vascular Disease History: 0 Age Score: 2 Gender Score: 0     CrCl 115 Platelet count 234   Patient does not require pre-op antibiotics for dental procedure.   Per office protocol, patient can hold Eliquis for 1 days prior to procedure.   Patient will not need bridging with Lovenox (enoxaparin) around procedure  I will route this recommendation to the requesting party via Epic fax function and remove from pre-op pool. Please call with questions.  Carlos Levering, NP 09/05/2022, 5:26 PM

## 2022-09-11 ENCOUNTER — Telehealth: Payer: Self-pay | Admitting: *Deleted

## 2022-09-11 NOTE — Telephone Encounter (Signed)
Patient called on (09/07/22) regarding message about referral to [facility] with Ophthalmology.  Called on (09/07/22),and LMOM informing the patient that I was returning his call, and I will give him a call back.//AB/CMA

## 2022-09-11 NOTE — Telephone Encounter (Signed)
Patient called back and asked if you could call him back in the afternoon after 1pm. Call back is [phone]

## 2022-09-11 NOTE — Telephone Encounter (Signed)
Called patient on (09/08/22),and LMOM informing the patient that I was returning his call regarding referral to Ophthalmology.//AB/CMA

## 2022-09-11 NOTE — Telephone Encounter (Signed)
Called patient on (09/11/22),and LMOM informing the patient that I was returning his call regarding referral to Ophthalmology.//AB/CMA

## 2022-09-12 NOTE — Telephone Encounter (Signed)
Called patient and [facility] @ 3:37pm) regarding message from the patient.//AB/CMA

## 2022-09-13 ENCOUNTER — Telehealth: Payer: Self-pay | Admitting: *Deleted

## 2022-09-13 NOTE — Telephone Encounter (Signed)
Patient called regarding referral to Ophthalmology.  Asked the patient about his appointment with Dr. Arnetha Gula.  Patient stated that he was given an appointment in July, but he was able to go because he will be going on vacation.    Patient asked about going to [facility]  Informed the patient that I don't know if [facility] does visual field testing, and he will need to have the visual field test done before having surgery.      Informed the patient that I will give the office a call, and give him a call back.    Called the patient back and informed him that they no longer have that appointment time, but he can give them a call to see what appointment times they may have.  Patient verbalized understanding and agreed to give the office a call back to see what appointment times they have.//AB/CMA

## 2022-09-26 ENCOUNTER — Encounter: Payer: Self-pay | Admitting: Plastic Surgery

## 2022-09-26 ENCOUNTER — Ambulatory Visit (INDEPENDENT_AMBULATORY_CARE_PROVIDER_SITE_OTHER): Payer: Medicare Other | Admitting: Plastic Surgery

## 2022-09-26 DIAGNOSIS — H02834 Dermatochalasis of left upper eyelid: Secondary | ICD-10-CM

## 2022-09-26 DIAGNOSIS — H02831 Dermatochalasis of right upper eyelid: Secondary | ICD-10-CM | POA: Diagnosis not present

## 2022-09-26 NOTE — Progress Notes (Signed)
   Subjective:    Patient ID: Micheal Erickson, male    DOB: [birth year], 77 y.o.   MRN: [id number]  The patient is a 77 year old male joining me by phone for further discussion about his upper lids.  He was being seen for dermatochalasis.  He had some hip issues so things were put on hold.  We were waiting on the visual field exam from the [place] and that was from 2 years ago so we are trying to get a repeat exam his complaint is for decreased visual field as the day goes on with hooding and obstruction of his temporal visual fields.  He does have excess skin resting on his upper lids.  He has an appointment for visual field exam but it is not until September.  He is planning on going out of town and visiting friends and relatives in the beginning of July.  He will return about a month or so later.  He will plan to keep his visit with the ophthalmologist.      Review of Systems  Constitutional: Negative.   Eyes: Negative.   Respiratory: Negative.    Cardiovascular: Negative.   Gastrointestinal: Negative.   Endocrine: Negative.   Genitourinary: Negative.        Objective:   Physical Exam      Assessment & Plan:     ICD-10-CM   1. Dermatochalasis of both upper eyelids  H02.831    H02.834      I connected with  Micheal Erickson on 09/26/22 by phone and verified that I am speaking with the correct person using two identifiers. The patient was at home and I was at the office.  We spent 5 min in discussion.   I discussed the limitations of evaluation and management by telemedicine. The patient expressed understanding and agreed to proceed.   Patient will call when he gets back in town and after he gets his visual field exam from the ophthalmologist.

## 2022-09-26 NOTE — Progress Notes (Signed)
   Subjective:    Patient ID: [name], male    DOB: [birth year], 77 y.o.   MRN: [id number]  The patient is a 77 year old male joining me by phone for further discussion about his upper lids.  He was being seen for dermatochalasis.  He had some hip issues so things were put on hold.  We were waiting on the visual field exam from the [place] and that was from 2 years ago so we are trying to get a repeat exam his complaint is for decreased visual field as the day goes on with hooding and obstruction of his temporal visual fields.  He does have excess skin resting on his upper lids.  He has an appointment for visual field exam but it is not until September.  He is planning on going out of town and visiting friends and relatives in the beginning of July.  He will return about a month or so later.  He will plan to keep his visit with the ophthalmologist.      Review of Systems  Constitutional: Negative.   Eyes: Negative.   Respiratory: Negative.    Cardiovascular: Negative.   Gastrointestinal: Negative.   Endocrine: Negative.   Genitourinary: Negative.        Objective:   Physical Exam      Assessment & Plan:     ICD-10-CM   1. Dermatochalasis of both upper eyelids  H02.831    H02.834      I connected with  [name] on 09/26/22 by phone and verified that I am speaking with the correct person using two identifiers. The patient was at home and I was at the office.  We spent 5 min in discussion.   I discussed the limitations of evaluation and management by telemedicine. The patient expressed understanding and agreed to proceed.   Patient will call when he gets back in town and after he gets his visual field exam from the ophthalmologist.

## 2022-10-19 ENCOUNTER — Telehealth (HOSPITAL_COMMUNITY): Payer: Self-pay | Admitting: Vascular Surgery

## 2022-10-19 NOTE — Telephone Encounter (Signed)
Lvm to make new pulm htn appt

## 2022-10-23 ENCOUNTER — Telehealth: Payer: Self-pay | Admitting: *Deleted

## 2022-10-23 NOTE — Telephone Encounter (Signed)
Removed patient from current surgery routes as VFT has been postponed by patient until September 2024. He will need to contact the office once completed.

## 2022-12-21 NOTE — Patient Instructions (Addendum)
Viral testing will result within the next 24 to 48 hours.    Take Zofran as needed for nausea.  Continue to hydrate well with electrolyte rich fluids like Gatorade or Pedialyte- small sips.  Trial a BRAT (bananas, rice, applesauce, toast) diet.  Increase dietary fiber.  Take over-the-counter Pepto-Bismol.  Take over-the-counter Imodium as needed.  Do not use Imodium if you develop significant abdominal pain or bloody stools.    Urinalysis is not concerning for a UTI in office.  It did show some blood.  This has been sent out for culture.  I have referred you to urology for further follow-up.  Call the number provided to schedule an appointment.    Follow-up with your PCP in the next 2 to 3 days    For acutely worsening symptoms go promptly to the ED

## 2022-12-21 NOTE — Unmapped External Note (Signed)
[facility] [place]   History  77 year old male with past medical history as detailed in the chart presents with complaint of fatigue, rhinorrhea, body aches, nausea with 1 bout of emesis 2 days ago, and watery brown diarrhea.  Symptoms ongoing for the past 3 days.  Denies testing for COVID at home.  Also reports increased urinary frequency.  Denies any associated fever, chest pain, shortness of breath, abdominal pain, dysuria, gross hematuria, or dizziness/lightheadedness.      History provided by:  Patient      Patient Active Problem List   Diagnosis Code   . Acute on chronic combined systolic and diastolic CHF (congestive heart failure) I50.43   . Atrial fibrillation with RVR I48.91   . Anticoagulated Z79.01   . Gout M10.9   . Chronic venous insufficiency I87.2   . Lower extremity edema R60.0   . Duodenal ulcer K26.9   . Chronic pain G89.29        Past Medical History:   Diagnosis Date   . Arthritis    . Atrial fibrillation    . Chronic back pain    . Depression    . Duodenal ulcer    . GERD (gastroesophageal reflux disease)        Past Surgical History:   Procedure Laterality Date   . HERNIA REPAIR     . TONSILLECTOMY          No family history on file.     Social History     Tobacco Use   . Smoking status: Never   . Smokeless tobacco: Never   Substance Use Topics   . Alcohol use: Not Currently     Alcohol/week: 14.0 standard drinks of alcohol     Types: 14 Cans of beer per week   . Drug use: Never          Review of Systems   Constitutional:  Positive for fatigue.   HENT:  Positive for congestion and rhinorrhea.    Respiratory:  Negative for shortness of breath.    Cardiovascular:  Negative for chest pain.   Gastrointestinal:  Positive for nausea and vomiting. Negative for abdominal pain, blood in stool, constipation and diarrhea.   Genitourinary:  Positive for frequency. Negative for dysuria, flank pain and hematuria.   Musculoskeletal:  Positive for myalgias.   Neurological:  Negative for dizziness, weakness  and light-headedness.          Physical Exam    Vitals:    12/21/22 1637   BP: 140/65   Pulse: 60   Resp: 19   Temp: 37.3 C (99.1 F)   TempSrc: Tympanic   SpO2: 98%         Physical Exam  Vitals and nursing note reviewed.   Constitutional:       General: He is not in acute distress.     Appearance: Normal appearance. He is not ill-appearing.      Comments:   Well appearing and seated comfortably    HENT:      Head: Normocephalic and atraumatic.      Right Ear: Tympanic membrane, ear canal and external ear normal.      Left Ear: Tympanic membrane, ear canal and external ear normal.      Nose: Congestion and rhinorrhea present. Rhinorrhea is clear.      Mouth/Throat:      Mouth: Mucous membranes are moist.      Pharynx: Oropharynx is clear. Uvula midline. Posterior oropharyngeal erythema  present. No oropharyngeal exudate.      Tonsils: No tonsillar abscesses.   Eyes:      Extraocular Movements: Extraocular movements intact.      Conjunctiva/sclera: Conjunctivae normal.      Pupils: Pupils are equal, round, and reactive to light.   Cardiovascular:      Rate and Rhythm: Normal rate and regular rhythm.      Pulses: Normal pulses.      Heart sounds: Normal heart sounds.   Pulmonary:      Effort: Pulmonary effort is normal.      Breath sounds: Normal breath sounds.   Abdominal:      General: Bowel sounds are normal.      Tenderness: There is no abdominal tenderness. There is no right CVA tenderness, left CVA tenderness, guarding or rebound.   Musculoskeletal:         General: Normal range of motion.      Cervical back: Normal range of motion.   Skin:     General: Skin is warm and dry.   Neurological:      Mental Status: He is alert and oriented to person, place, and time.      Gait: Gait normal.   Psychiatric:         Mood and Affect: Mood normal.         Behavior: Behavior normal.           ED Procedures  Procedures     Results for orders placed or performed in visit on 12/21/22   POCT Urinalysis Dipstick   Result Value  Ref Range    COLOR, UR Yellow     APPEARANCE, UR Clear     GLUCOSE, UR Negative     KETONES, UR Trace     SPEC GRAVITY, UR 1.020     BLOOD, UR Moderate     PH, UR 5.5     PROTEIN, UR 30.     NITRITES, UR Negative     LEUK ESTERASE, UR Negative      *Note: Due to a large number of results and/or encounters for the requested time period, some results have not been displayed. A complete set of results can be found in Results Review.        No results found.        ED Course  ED MDM:      Differential Diagnosis:     Differential diagnosis includes:  Viral URI, COVID, flu, gastroenteritis, foodborne illness    UTI, cystitis, prostatitis    ED Course:     ED course details:    VSS and overall exam reassuring    Patient is well-appearing and in no distress.    UA in office with moderate blood but otherwise reassuring.  No LE or nitrites.  Not acutely concerning for UTI.  Sent out for culture.    Given referral to follow-up with a urologist.    Viral testing also collected and sent to lab.    Given prescription for Zofran.  Encouraged a bland diet.  Encouraged to hydrate well with electrolyte rich fluids.    Encouraged supportive measures.  Encouraged to alternate Tylenol and ibuprofen.    Encouraged PCP follow-up as needed.    Advised to go promptly to the ED for acutely worsening symptoms.        Discussion with independent historian(s):     Independent historian(s) utilized:  Patient    Disposition:     Discharge: I had  a discussion with the patient and/or guardian regarding discharge diagnosis and plan.  Based on the patient's history, exam and diagnostic evaluation, there is no indication for further emergent intervention or inpatient treatment.  Verbal and written discharge instructions and warnings were provided. Patient was encouraged to return for any worsening symptoms, persisting symptoms, or any other concerns. Patient was provided the opportunity to ask questions.        Follow up: Patient was advised to  follow-up as instructed.        Kyair was seen today for other.    Diagnoses and all orders for this visit:    Urinary frequency  -     POCT Urinalysis Dipstick  -     Urine Culture And Sensitivity; Future  -     Urology; Future    Viral URI  -     SARS-COV2 by Real Time-PCR; Future  -     Rapid Influenza A&B PCR; Future    Diarrhea, unspecified type    Nausea  -     ondansetron 4 MG Oral tablet; Take 1 tablet by mouth every 8 (eight) hours as needed.         ED [facility] [place], [place]  12/21/22

## 2022-12-22 ENCOUNTER — Telehealth: Payer: Self-pay

## 2022-12-22 NOTE — Telephone Encounter (Signed)
Patient called regarding lab results. Please call back.

## 2022-12-22 NOTE — Telephone Encounter (Signed)
Called patient, was unable to give full results, as he was seen at [facility]. Pt given results from Care [facility] and given number for their UC.

## 2022-12-27 ENCOUNTER — Encounter (HOSPITAL_COMMUNITY): Payer: Medicare Other | Admitting: Cardiology

## 2023-01-04 ENCOUNTER — Other Ambulatory Visit: Payer: Self-pay | Admitting: Gastroenterology

## 2023-01-04 ENCOUNTER — Inpatient Hospital Stay: Admit: 2023-01-04 | Discharge: 2023-01-04 | Disposition: A | Discharge status: Home / Self Care | Payer: Self-pay

## 2023-01-09 ENCOUNTER — Encounter: Payer: Self-pay | Admitting: Gastroenterology

## 2023-01-12 ENCOUNTER — Other Ambulatory Visit: Payer: Self-pay

## 2023-04-18 NOTE — Telephone Encounter (Signed)
 Call Information   What is the primary reason you are calling today?: Other                                                                                  OTHER REASON FOR CALL: Patient states he received a call from [facility]. Patient requesting call back.

## 2023-04-23 ENCOUNTER — Telehealth: Payer: Self-pay | Admitting: Cardiovascular Disease

## 2023-04-23 NOTE — Telephone Encounter (Signed)
Patient called to talk with Dr. Allyson Sabal or nurse. Patient said it was personal matter

## 2023-04-23 NOTE — Telephone Encounter (Signed)
Pt is calling seeking medication clearance for an upcoming dental procedure. Pt states that the plan is to do some fillings and routine cleaning. Pt would like to know if he should hold his Eliquis for this procedure. Explained to pt that we do not typically hold eliquis for routine dental cleaning and fillings. Requested that pt have his dentist send our office a clearance to document information given. Pt given out fax number and he says he will have them send a clearance later this week.

## 2023-05-01 ENCOUNTER — Telehealth: Payer: Self-pay | Admitting: Plastic Surgery

## 2023-05-01 ENCOUNTER — Telehealth: Payer: Self-pay

## 2023-05-01 NOTE — Telephone Encounter (Signed)
Please refer to notes on most recent referral to Dr. Rexford Maus office for visual field study.

## 2023-05-01 NOTE — Telephone Encounter (Signed)
Returned patient's call regarding appointment for a bilateral bleph. Informed to bring VFT with him to the appointment.

## 2023-05-01 NOTE — Telephone Encounter (Signed)
Patient would like to know if 78 year old image / exams are ok to bring or should he have another one done, please reach out and advise

## 2023-05-04 ENCOUNTER — Ambulatory Visit (HOSPITAL_COMMUNITY)
Admission: RE | Admit: 2023-05-04 | Discharge: 2023-05-04 | Disposition: A | Discharge status: Home / Self Care | Payer: Medicare Other | Source: Ambulatory Visit | Attending: Cardiology | Admitting: Cardiology

## 2023-05-04 DIAGNOSIS — I480 Paroxysmal atrial fibrillation: Secondary | ICD-10-CM

## 2023-05-04 DIAGNOSIS — I5043 Acute on chronic combined systolic (congestive) and diastolic (congestive) heart failure: Secondary | ICD-10-CM | POA: Diagnosis not present

## 2023-05-04 LAB — ECHOCARDIOGRAM COMPLETE
Area-P 1/2: 2.59 cm2
MV M vel: 1.29 m/s
MV Peak grad: 6.7 mm[Hg]
S' Lateral: 3.65 cm

## 2023-05-24 ENCOUNTER — Ambulatory Visit: Payer: Medicare Other | Attending: Physician Assistant | Admitting: Physician Assistant

## 2023-05-24 NOTE — Progress Notes (Signed)
 This encounter was created in error - please disregard.

## 2023-05-29 ENCOUNTER — Encounter: Payer: Self-pay | Admitting: Plastic Surgery

## 2023-05-29 ENCOUNTER — Ambulatory Visit (INDEPENDENT_AMBULATORY_CARE_PROVIDER_SITE_OTHER): Payer: Medicare Other | Admitting: Plastic Surgery

## 2023-05-29 VITALS — BP 137/78 | HR 58 | Wt 179.4 lb

## 2023-05-29 DIAGNOSIS — H02831 Dermatochalasis of right upper eyelid: Secondary | ICD-10-CM

## 2023-05-29 DIAGNOSIS — H02834 Dermatochalasis of left upper eyelid: Secondary | ICD-10-CM | POA: Diagnosis not present

## 2023-05-29 NOTE — Addendum Note (Signed)
 Addended by: Verdie Shire on: 05/29/2023 04:50 PM   Modules accepted: Orders

## 2023-05-29 NOTE — Progress Notes (Signed)
   Subjective:    Patient ID: [name], male    DOB: Aug 22, 1945, 78 y.o.   MRN: [id number]  The patient is a 78 year old male here for follow-up after visual field exam.  I reviewed the exam with him.  He still having the same issues and symptoms including possible dry eye symptoms.      Review of Systems  Constitutional: Negative.   Eyes: Negative.   Respiratory: Negative.    Cardiovascular: Negative.   Gastrointestinal: Negative.   Genitourinary: Negative.        Objective:   Physical Exam Constitutional:      Appearance: Normal appearance.  HENT:     Head: Atraumatic.  Cardiovascular:     Rate and Rhythm: Normal rate.     Pulses: Normal pulses.  Skin:    General: Skin is warm.     Capillary Refill: Capillary refill takes less than 2 seconds.  Neurological:     Mental Status: He is oriented to person, place, and time.  Psychiatric:        Mood and Affect: Mood normal.        Behavior: Behavior normal.        Thought Content: Thought content normal.        Judgment: Judgment normal.        Assessment & Plan:     ICD-10-CM   1. Dermatochalasis of both upper eyelids  H02.831    H02.834       I reviewed the results of the visual field exam with the patient.  I think that he would be best treated by Dr. Shawna Orleans.  The patient is in agreement.  Referral made.

## 2023-05-31 ENCOUNTER — Telehealth: Payer: Self-pay | Admitting: *Deleted

## 2023-05-31 NOTE — Telephone Encounter (Signed)
 Faxed recent VF Ptosisi results to Dr. Shawna Orleans office for patient's appointment.  Confirmation received.//AB/CMA

## 2023-06-01 ENCOUNTER — Telehealth: Payer: Self-pay | Admitting: Plastic Surgery

## 2023-06-01 NOTE — Telephone Encounter (Signed)
 Patient would like a copy of his eye test requested by Dr D

## 2023-06-04 ENCOUNTER — Telehealth: Payer: Self-pay | Admitting: *Deleted

## 2023-06-04 NOTE — Telephone Encounter (Signed)
 Patient called requesting a copy of his VF Ptosis results for himself.  Informed the patient that he will need to fill out a Verbal Request for Release of Medical Records from Patient or other Medical Facility (for continuity of care).    Also informed the patient that I have faxed the VF Ptosis results to Dr. Shawna Orleans office for his appointment.  Patient stated that he was told by the provider that he could have a copy of his VF Ptosis results.  Informed him that he may have a copy of his results, but we need him to fill out the Verbal Results for Release of Medical Records first.   Patient stated that he should not have to fill out the release form for his own records.  Patient refused to fill out the form.//AB/CMA

## 2023-06-14 ENCOUNTER — Telehealth: Payer: Self-pay | Admitting: *Deleted

## 2023-06-14 NOTE — Telephone Encounter (Signed)
 Pt scheduled to see Dr. Allyson Sabal 06/20/23, clearance will be addressed at that time.  Will let requesting surgeon's office know.       Pre-operative Risk Assessment    Patient Name: [name]  DOB: 06-18-1945 MRN: [id number]   Date of last office visit: 07/2022 Date of next office visit: 06/20/23  APPT NOTES ALREADY UPDATED FOR PREOP CLEARANCE   Request for Surgical Clearance    Procedure:   RIGHT TOTAL KNEE ARTHROPLASTY  Date of Surgery:  Clearance TBD                                Surgeon:  Velna Ochs, MD Surgeon's Group or Practice Name:  [facility] Phone number:  [phone] Fax number:  [phone]   Type of Clearance Requested:   - Medical  - Pharmacy:  Hold Apixaban (Eliquis) NOT INDICATED HOW LONG   Type of Anesthesia:  Spinal   Additional requests/questions:    [name]   06/14/2023, 2:26 PM

## 2023-06-15 DIAGNOSIS — K7030 Alcoholic cirrhosis of liver without ascites: Secondary | ICD-10-CM | POA: Insufficient documentation

## 2023-06-15 DIAGNOSIS — J90 Pleural effusion, not elsewhere classified: Secondary | ICD-10-CM | POA: Insufficient documentation

## 2023-06-15 NOTE — Telephone Encounter (Signed)
 Patient with diagnosis of A Fib on Eliquis for anticoagulation.    Procedure:  RIGHT TOTAL KNEE ARTHROPLASTY  Date of procedure: TBD   CHA2DS2-VASc Score = 4  This indicates a 4.8% annual risk of stroke. The patient's score is based upon: CHF History: 1 HTN History: 1 Diabetes History: 0 Stroke History: 0 Vascular Disease History: 0 Age Score: 2 Gender Score: 0    CrCl 89 ml/min Platelet count 170K   Per office protocol, patient can hold Eliquis for 3 days prior to procedure.    **This guidance is not considered finalized until pre-operative APP has relayed final recommendations.**

## 2023-06-20 ENCOUNTER — Encounter: Payer: Self-pay | Admitting: Cardiovascular Disease

## 2023-06-20 ENCOUNTER — Ambulatory Visit: Payer: Medicare Other | Attending: Cardiovascular Disease | Admitting: Cardiovascular Disease

## 2023-06-20 VITALS — BP 112/56 | HR 60 | Ht 73.0 in | Wt 187.0 lb

## 2023-06-20 DIAGNOSIS — I480 Paroxysmal atrial fibrillation: Secondary | ICD-10-CM | POA: Insufficient documentation

## 2023-06-20 DIAGNOSIS — I272 Pulmonary hypertension, unspecified: Secondary | ICD-10-CM | POA: Insufficient documentation

## 2023-06-20 DIAGNOSIS — I4891 Unspecified atrial fibrillation: Secondary | ICD-10-CM | POA: Diagnosis not present

## 2023-06-20 DIAGNOSIS — R600 Localized edema: Secondary | ICD-10-CM | POA: Insufficient documentation

## 2023-06-20 NOTE — Assessment & Plan Note (Signed)
 Chronic a flutter rate controlled on Eliquis oral anticoagulation.

## 2023-06-20 NOTE — Assessment & Plan Note (Signed)
 His last 2D echo performed 05/04/2023 revealed normal pulmonary artery pressures.

## 2023-06-20 NOTE — Progress Notes (Signed)
 06/20/2023 Tacy Dura   [birth year]  [id number]  Primary Physician [facility] Primary Cardiologist: Runell Gess MD Milagros Loll, [place], [place]  HPI:  [name] is a 78 y.o.  mildly overweight married Caucasian male father of 2, grandfather of 1 grandchild who I initially saw in consultation 02/04/2019 when he was admitted with diastolic heart failure and A. fib with RVR.  I last saw him in the office 07/19/2022.  He is retired from the [employer].  He does not smoke.  He drinks daily but only 2 beers a night and occasional wine with dinner.  He has no history of diabetes, hypertension or hyperlipidemia.  Never had a heart attack or stroke.  There is no family's for heart disease.  He is fairly active.  He did play division 3 football and hockey in his younger years.  He is unaware that he is in A. fib at this point.     Unfortunately, he broke his right hip during a fishing trip up in [place] in August 2023 and had ORIF and is currently recovering from this.  He does complain of bilateral lower extremity edema.   Since I saw him in the office a year ago he continues to do well.  He has recovered from his hip replacement.  Apparently he needs bilateral knee replacements.  He does have persistent A-fib rate controlled on Eliquis oral anticoagulation.  He denies chest pain or shortness of breath.  He is fairly active.  His most recent 2D echo performed 05/04/2023 revealed an EF of 50 to 55% with normal pulmonary artery pressures and mild MR his inferior vena cava was mildly dilated however.   Current Meds  Medication Sig   allopurinol (ZYLOPRIM) 300 MG tablet Take 600 mg by mouth every morning.   apixaban (ELIQUIS) 5 MG TABS tablet Take 1 tablet (5 mg total) by mouth 2 (two) times daily.   CALCIUM 600 1500 (600 Ca) MG TABS tablet Take 600 mg of elemental calcium by mouth 2 (two) times daily.    Cholecalciferol (VITAMIN D3 PO) Take 1 tablet by mouth daily.    clotrimazole (LOTRIMIN) 1 % cream Apply 1 application. topically daily.   famotidine (PEPCID) 20 MG tablet Take 1 tablet by mouth 2 (two) times daily.   furosemide (LASIX) 20 MG tablet Take 2 tablets (40 mg total) by mouth daily.   gabapentin (NEURONTIN) 100 MG capsule 200 mg 2 (two) times daily. Noon and midnight   magnesium oxide (MAG-OX) 400 MG tablet Take 400 mg by mouth 2 (two) times daily.   metoprolol tartrate (LOPRESSOR) 25 MG tablet Take 1 tablet (25 mg total) by mouth 2 (two) times daily. Hold SYSTOLIC BP <100, HR <60   morphine (MS CONTIN) 15 MG 12 hr tablet Take 15 mg by mouth every 12 (twelve) hours. Noon and midnight   Multiple Vitamin (MULTIVITAMIN WITH MINERALS) TABS tablet Take 1 tablet by mouth daily.   oxyCODONE (OXY IR/ROXICODONE) 5 MG immediate release tablet Take 1 tablet (5 mg total) by mouth every 4 (four) hours as needed for severe pain. In addition to current chronic pain medication (Patient taking differently: Take 10 mg by mouth every 3 (three) hours as needed for severe pain (pain score 7-10). In addition to current chronic pain medication)   PRESCRIPTION MEDICATION Take 1 capsule by mouth at bedtime. Green and white capsule from [facility]   sildenafil (VIAGRA) 100 MG tablet Take 100 mg by mouth  daily as needed for erectile dysfunction.   tamsulosin (FLOMAX) 0.4 MG CAPS capsule Take 0.4 mg by mouth 2 (two) times daily.   Testosterone 20.25 MG/ACT (1.62%) GEL Apply 20.25 mg topically daily. Apply to shoulders   vitamin B-12 (CYANOCOBALAMIN) 1000 MCG tablet Take 1,000 mcg by mouth daily.     No Known Allergies  Social History   Socioeconomic History   Marital status: Married    Spouse name: Not on file   Number of children: Not on file   Years of education: Not on file   Highest education level: Not on file  Occupational History   Not on file  Tobacco Use   Smoking status: Never   Smokeless tobacco: Never  Vaping Use   Vaping status: Never Used  Substance and  Sexual Activity   Alcohol use: Yes    Alcohol/week: 28.0 standard drinks of alcohol    Types: 14 Glasses of wine, 14 Cans of beer per week    Comment: daily   Drug use: No   Sexual activity: Not on file  Other Topics Concern   Not on file  Social History Narrative   Not on file   Social Drivers of Health   Financial Resource Strain: Not on file  Food Insecurity: Not on file  Transportation Needs: Not on file  Physical Activity: Not on file  Stress: Not on file  Social Connections: Not on file  Intimate Partner Violence: Unknown (07/30/2021)   [facility]   Intimate Partner Violence    Fear of Current or Ex-Partner: Not on file     Review of Systems: General: negative for chills, fever, night sweats or weight changes.  Cardiovascular: negative for chest pain, dyspnea on exertion, edema, orthopnea, palpitations, paroxysmal nocturnal dyspnea or shortness of breath Dermatological: negative for rash Respiratory: negative for cough or wheezing Urologic: negative for hematuria Abdominal: negative for nausea, vomiting, diarrhea, bright red blood per rectum, melena, or hematemesis Neurologic: negative for visual changes, syncope, or dizziness All other systems reviewed and are otherwise negative except as noted above.    Blood pressure (!) 112/56, pulse 60, height 6\' 1"  (1.854 m), weight 187 lb (84.8 kg), SpO2 98%.  General appearance: alert and no distress Neck: no adenopathy, no carotid bruit, no JVD, supple, symmetrical, trachea midline, and thyroid not enlarged, symmetric, no tenderness/mass/nodules Lungs: clear to auscultation bilaterally Heart: irregularly irregular rhythm Extremities: 2+ pitting edema bilaterally Pulses: 2+ and symmetric Skin: Skin color, texture, turgor normal. No rashes or lesions Neurologic: Grossly normal  EKG EKG Interpretation Date/Time:  Wednesday June 20 2023 14:14:20 EDT Ventricular Rate:  60 PR Interval:    QRS  Duration:  96 QT Interval:  424 QTC Calculation: 424 R Axis:   -6  Text Interpretation: Atrial flutter with variable A-V block with premature ventricular or aberrantly conducted complexes When compared with ECG of 06-Sep-2021 13:48, No significant change was found Confirmed by Nanetta Batty [phone]) on 06/20/2023 2:20:47 PM    ASSESSMENT AND PLAN:   Atrial fibrillation with RVR (HCC) Chronic a flutter rate controlled on Eliquis oral anticoagulation.  Lower extremity edema Chronic lower extremity edema on furosemide.  Diastolic parameters in his recent 2D echo were normal as was his systolic function.  Pulmonary hypertension, unspecified (HCC) His last 2D echo performed 05/04/2023 revealed normal pulmonary artery pressures.     Runell Gess MD FACP,FACC,FAHA, [facility] 06/20/2023 2:33 PM

## 2023-06-20 NOTE — Patient Instructions (Signed)

## 2023-06-20 NOTE — Assessment & Plan Note (Signed)
 Chronic lower extremity edema on furosemide.  Diastolic parameters in his recent 2D echo were normal as was his systolic function.

## 2023-06-25 NOTE — Telephone Encounter (Signed)
 Dr. Allyson Sabal, pt is pending right total knee arthroplasty and was seen by you on 06/20/2023. Can you please comment on cardiac clearance and route your response to p cv div preop?  Thank you, Levi Aland, NP-C 06/25/2023, 8:42 AM [address] Office [phone] Fax [phone]

## 2023-06-25 NOTE — Telephone Encounter (Signed)
   Primary Cardiologist: Nanetta Batty, MD  Chart reviewed as part of pre-operative protocol coverage. Given past medical history and time since last visit, based on ACC/AHA guidelines, [name] would be at acceptable risk for the planned procedure without further cardiovascular testing.   Patient was advised that if he develops new symptoms prior to surgery to contact our office to arrange a follow-up appointment.  He verbalized understanding.  Per office protocol, patient can hold Eliquis for 3 days prior to procedure.    I will route this recommendation to the requesting party via Epic fax function and remove from pre-op pool.  Please call with questions.  Levi Aland, NP-C  06/25/2023, 1:54 PM [address], [address] Office [phone] Fax [phone]

## 2023-07-05 ENCOUNTER — Telehealth: Payer: Self-pay | Admitting: Plastic Surgery

## 2023-07-05 NOTE — Telephone Encounter (Signed)
 Pt called and said he wanted to only talk to Dr Ulice Bold. He had an apt with Dr Shawna Orleans today and it was changed to May, he is not happy about that. He wants to be referred to a new provider. But before she does he wanted to talk with Dr Ulice Bold. He said he wants her to send him to a provider she has known for a while and he does not want to have to wait until May to be seen by a provider. I did let him know we could change the referral but we can't guarantee when the next facility will have an opening. I asked if I could send his referral now to a new provider and he stated not until he speaks with Dr Ulice Bold. I asked to make him a visit with Dr D such as a televisit and he does not want another visit, he just wants to speak with her. Please Advise Clinical staff

## 2023-07-13 ENCOUNTER — Ambulatory Visit: Payer: Medicare Other | Admitting: Plastic Surgery

## 2023-09-14 ENCOUNTER — Telehealth: Payer: Self-pay | Admitting: Plastic Surgery

## 2023-09-14 NOTE — Telephone Encounter (Signed)
 Ginger called from [facility] returning Brittanys call to them, to give us  an update on the pt referral/appointment. She stated that the pt was very rude. He had an apt in April and it need to be r/s They were going to book him in may and he cancelled and told them he will not be r/s with them. [facility] number is [phone], if you need to call. She wanted to give us  an update

## 2023-09-26 ENCOUNTER — Encounter (HOSPITAL_COMMUNITY): Payer: Self-pay | Admitting: Internal Medicine

## 2023-09-26 ENCOUNTER — Other Ambulatory Visit (HOSPITAL_COMMUNITY): Payer: Self-pay | Admitting: Internal Medicine

## 2023-09-26 DIAGNOSIS — I425 Other restrictive cardiomyopathy: Secondary | ICD-10-CM

## 2023-10-10 ENCOUNTER — Encounter (HOSPITAL_COMMUNITY): Payer: Self-pay

## 2023-10-11 ENCOUNTER — Ambulatory Visit (HOSPITAL_COMMUNITY)
Admission: RE | Admit: 2023-10-11 | Discharge: 2023-10-11 | Disposition: A | Discharge status: Home / Self Care | Source: Ambulatory Visit | Attending: Internal Medicine | Admitting: Internal Medicine

## 2023-10-11 ENCOUNTER — Other Ambulatory Visit (HOSPITAL_COMMUNITY): Payer: Self-pay | Admitting: Internal Medicine

## 2023-10-11 DIAGNOSIS — I425 Other restrictive cardiomyopathy: Secondary | ICD-10-CM

## 2023-10-11 MED ORDER — GADOBUTROL 1 MMOL/ML IV SOLN
10.0000 mL | Freq: Once | INTRAVENOUS | Status: AC | PRN
  Administered 2023-10-11: 10 mL via INTRAVENOUS

## 2023-10-19 ENCOUNTER — Telehealth: Payer: Self-pay

## 2023-10-19 NOTE — Telephone Encounter (Signed)
   Pre-operative Risk Assessment    Patient Name: [name]  DOB: [birth year] MRN: [id number]   Date of last office visit: 06/20/23 DORN LESCHES, MD Date of next office visit: NONE   Request for Surgical Clearance    Procedure:  RIGHT TOTAL KNEE ARTHROPLASTY  Date of Surgery:  Clearance TBD                                Surgeon:  MAUDE HERALD, MD Surgeon's Group or Practice Name:  [facility] Phone number:  [phone] Fax number:  [phone]  ATTN: REBECCA LANG   Type of Clearance Requested:   - Medical  - Pharmacy:  Hold Apixaban  (Eliquis )     Type of Anesthesia:  Spinal   Additional requests/questions:    SignedLucie DELENA Ku   10/19/2023, 11:42 AM

## 2023-10-24 NOTE — Telephone Encounter (Signed)
 Left message to call back to schedule tele pre op appt.

## 2023-10-24 NOTE — Telephone Encounter (Signed)
   Patient Name: [name]  DOB: 17-Jan-1946 MRN: [id number]  Primary Cardiologist: Dorn Lesches, MD  Clinical pharmacists have reviewed the patient's past medical history, labs, and current medications as part of preoperative protocol coverage. The following recommendations have been made:         I will route this recommendation to the requesting party via Epic fax function and remove from pre-op pool.  Please call with questions.  Lamarr Satterfield, NP 10/24/2023, 9:42 AM

## 2023-10-24 NOTE — Telephone Encounter (Signed)
 Patient with diagnosis of afib on Eliquis  for anticoagulation.    Procedure:  RIGHT TOTAL KNEE ARTHROPLASTY  Date of procedure: TBD   CHA2DS2-VASc Score = 4   This indicates a 4.8% annual risk of stroke. The patient's score is based upon: CHF History: 1 HTN History: 1 Diabetes History: 0 Stroke History: 0 Vascular Disease History: 0 Age Score: 2 Gender Score: 0      CrCl 85 ml/min Platelet count 146  Patient has not had an Afib/aflutter ablation within the last 3 months or DCCV within the last 30 days  Per office protocol, patient can hold Eliquis  for 3 days prior to procedure.    **This guidance is not considered finalized until pre-operative APP has relayed final recommendations.**

## 2023-10-24 NOTE — Telephone Encounter (Signed)
   Name: [name]  DOB: 04/21/1945  MRN: [id number]  Primary Cardiologist: Dorn Lesches, MD   Preoperative team, please contact this patient and set up a phone call appointment for further preoperative risk assessment. Please obtain consent and complete medication review. Thank you for your help. Last seen by Dr. Lesches on 06/20/2023  I confirm that guidance regarding antiplatelet and oral anticoagulation therapy has been completed and, if necessary, noted below.  CHA2DS2-VASc Score = 4   This indicates a 4.8% annual risk of stroke. The patient's score is based upon: CHF History: 1 HTN History: 1 Diabetes History: 0 Stroke History: 0 Vascular Disease History: 0 Age Score: 2 Gender Score: 0       CrCl 85 ml/min Platelet count 146   Patient has not had an Afib/aflutter ablation within the last 3 months or DCCV within the last 30 days   Per office protocol, patient can hold Eliquis  for 3 days prior to procedure.    I also confirmed the patient resides in the state of [place] . As per [facility] Medical Board telemedicine laws, the patient must reside in the state in which the provider is licensed.   Lamarr Satterfield, NP 10/24/2023, 11:19 AM [facility]

## 2023-10-25 NOTE — Telephone Encounter (Signed)
2nd attempt to reach the pt to schedule a tele pre op appt.

## 2023-10-30 NOTE — Telephone Encounter (Signed)
 3rd attempt to reach pt was unsuccessful. Will route to requesting office to make them aware.

## 2023-10-30 NOTE — Telephone Encounter (Signed)
 Patient returned call and I have scheduled him for a preop telehealth visit for 11/02/23.

## 2023-10-31 NOTE — Progress Notes (Unsigned)
 Virtual Visit via Telephone Note   Because of [name] co-morbid illnesses, he is at least at moderate risk for complications without adequate follow up.  This format is felt to be most appropriate for this patient at this time.  Due to technical limitations with video connection (technology), today's appointment will be conducted as an audio only telehealth visit, and [name] verbally agreed to proceed in this manner.   All issues noted in this document were discussed and addressed.  No physical exam could be performed with this format.  Evaluation Performed:  Preoperative cardiovascular risk assessment _____________   Date:  10/31/2023   Patient ID:  [name], DOB January 06, 1946, MRN [id number] Patient Location:  Home Provider location:   Office  Primary Care Provider:  [facility] Primary Cardiologist:  Dorn Lesches, MD  Chief Complaint / Patient Profile   78 y.o. y/o male with a h/o paroxysmal atrial fibrillation, lower extremity edema, pulmonary hypertension who is pending right total knee arthroplasty and presents today for telephonic preoperative cardiovascular risk assessment.  History of Present Illness    [name] is a 78 y.o. male who presents via audio/video conferencing for a telehealth visit today.  Pt was last seen in cardiology clinic on 06/20/2023 by Lesches.  At that time [name] was doing well .  The patient is now pending procedure as outlined above. Since his last visit, he continues to be stable from a cardiac standpoint.  Today he denies chest pain, shortness of breath, increased lower extremity edema, fatigue, palpitations, melena, hematuria, hemoptysis, diaphoresis, weakness, presyncope, syncope, orthopnea, and PND.   Past Medical History    Past Medical History:  Diagnosis Date   Atrial fibrillation with rapid ventricular response (HCC) 10/25/2018   Successful  DCCV shock x1 200 J 11/04/2018   Chronic back pain     buldging disc   Depression    GERD (gastroesophageal reflux disease)    GOUT    takes Allopurinol  daily and Colchicine  as needed   History of bronchitis    many yrs ago   History of colonoscopy    benign   History of shingles    Joint pain    Joint swelling    Muscle spasm    takes Robaxin  as needed   Nocturia    OSTEOARTHRITIS    Peripheral edema    takes Lasix  daily as needed   Pneumonia 1969   Seasonal allergies    takes Zyrtec and uses Flonase  is needed   TESTICULAR HYPOFUNCTION    Zenker diverticula    Past Surgical History:  Procedure Laterality Date   ANKLE SURGERY     4 times   BIOPSY  07/22/2021   Procedure: BIOPSY;  Surgeon: Eda Iha, MD;  Location: [facility] ENDOSCOPY;  Service: Gastroenterology;;   CAPSULOTOMY Right 08/20/2019   Procedure: SCAR CONTRACTURE AND CAPSULE RELEASE OF RIGHT BREAST TISSUE;  Surgeon: Lowery Estefana RAMAN, DO;  Location: [facility];  Service: Plastics;  Laterality: Right;   CARDIOVERSION N/A 11/04/2018   Procedure: CARDIOVERSION;  Surgeon: Alveta, Aleene PARAS, MD;  Location[facility] ENDOSCOPY;  Service: Cardiovascular;  Laterality: N/A;   COLONOSCOPY     ELBOW SURGERY Right    torn tendon   ESOPHAGOGASTRODUODENOSCOPY (EGD) WITH PROPOFOL  N/A 07/22/2021   Procedure: ESOPHAGOGASTRODUODENOSCOPY (EGD) WITH PROPOFOL ;  Surgeon: Eda Iha, MD;  Location: [facility] ENDOSCOPY;  Service: Gastroenterology;  Laterality: N/A;   INGUINAL HERNIA REPAIR     right  KNEE SURGERY     3 on each knee   SCAR REVISION Right 08/20/2019   Procedure: SCAR REVISION;  Surgeon: Lowery Estefana RAMAN, DO;  Location: [facility];  Service: Plastics;  Laterality: Right;   SHOULDER ARTHROSCOPY Right 06/15/2015   Procedure: RIGHT SHOULDER ARTHROSCOPY, ACROMIOPLASTY, DEBRIDEMENT;  Surgeon: Maude Herald, MD;  Location: [facility] OR;  Service: Orthopedics;  Laterality: Right;   shoulder arthrosopy Left    TONSILLECTOMY AND ADENOIDECTOMY     TUMOR  EXCISION     sebaceous cyst removed from chest   ZENKER'S DIVERTICULECTOMY ENDOSCOPIC N/A 06/14/2016   Procedure: ZENKER'S DIVERTICULECTOMY ENDOSCOPIC;  Surgeon: Ida Loader, MD;  Location: [facility] OR;  Service: ENT;  Laterality: N/A;    Allergies  No Known Allergies  Home Medications    Prior to Admission medications   Medication Sig Start Date End Date Taking? Authorizing Provider  allopurinol  (ZYLOPRIM ) 300 MG tablet Take 600 mg by mouth every morning.    [provider]  apixaban  (ELIQUIS ) 5 MG TABS tablet Take 1 tablet (5 mg total) by mouth 2 (two) times daily. 07/24/21   Bryan Bianchi, MD  CALCIUM  600 1500 (600 Ca) MG TABS tablet Take 600 mg of elemental calcium  by mouth 2 (two) times daily.  02/16/16   [provider]  Cholecalciferol (VITAMIN D3 PO) Take 1 tablet by mouth daily.    [provider]  clotrimazole (LOTRIMIN) 1 % cream Apply 1 application. topically daily.    [provider]  famotidine (PEPCID) 20 MG tablet Take 1 tablet by mouth 2 (two) times daily. 08/23/21   [provider]  furosemide  (LASIX ) 20 MG tablet Take 2 tablets (40 mg total) by mouth daily. 04/12/22   Court Dorn PARAS, MD  gabapentin  (NEURONTIN ) 100 MG capsule 200 mg 2 (two) times daily. Noon and midnight 05/24/21   [provider]  magnesium  oxide (MAG-OX) 400 MG tablet Take 400 mg by mouth 2 (two) times daily. 07/21/19   [provider]  metoprolol  tartrate (LOPRESSOR ) 25 MG tablet Take 1 tablet (25 mg total) by mouth 2 (two) times daily. Hold SYSTOLIC BP <100, HR <60 11/28/18   Emelia Josefa HERO, NP  morphine  (MS CONTIN ) 15 MG 12 hr tablet Take 15 mg by mouth every 12 (twelve) hours. Noon and midnight    [provider]  Multiple Vitamin (MULTIVITAMIN WITH MINERALS) TABS tablet Take 1 tablet by mouth daily.    [provider]  oxyCODONE  (OXY IR/ROXICODONE ) 5 MG immediate release tablet Take 1 tablet (5 mg total) by mouth every 4  (four) hours as needed for severe pain. In addition to current chronic pain medication Patient taking differently: Take 10 mg by mouth every 3 (three) hours as needed for severe pain (pain score 7-10). In addition to current chronic pain medication 06/15/15   Lenis Barter, PA-C  pantoprazole  (PROTONIX ) 40 MG tablet Take 1 tablet (40 mg total) by mouth 2 (two) times daily. Pantoprazole  40 mg two times daily x 8 weeks, then 40mg  once daily 07/22/21 07/19/22  Bryan Bianchi, MD  PRESCRIPTION MEDICATION Take 1 capsule by mouth at bedtime. Green and white capsule from [facility]    [provider]  sildenafil  (VIAGRA ) 100 MG tablet Take 100 mg by mouth daily as needed for erectile dysfunction.    [provider]  tamsulosin  (FLOMAX ) 0.4 MG CAPS capsule Take 0.4 mg by mouth 2 (two) times daily.    [provider]  Testosterone  20.25 MG/ACT (1.62%) GEL  Apply 20.25 mg topically daily. Apply to shoulders 06/05/20   [provider]  vitamin B-12 (CYANOCOBALAMIN ) 1000 MCG tablet Take 1,000 mcg by mouth daily. 12/31/18   [provider]    Physical Exam    Vital Signs:  Lawayne SAUNDERS Klepper does not have vital signs available for review today.  Given telephonic nature of communication, physical exam is limited. AAOx3. NAD. Normal affect.  Speech and respirations are unlabored.  Accessory Clinical Findings    None  Assessment & Plan    1.  Preoperative Cardiovascular Risk Assessment: RIGHT TOTAL KNEE ARTHROPLASTY   Date of Surgery:  Clearance TBD                                  Surgeon:  MAUDE HERALD, MD Surgeon's Group or Practice Name:  [facility] Phone number:  [phone] Fax number:  [phone]      Primary Cardiologist: Dorn Lesches, MD  Chart reviewed as part of pre-operative protocol coverage. Given past medical history and time since last visit, based on ACC/AHA guidelines, NEAMIAH SCIARRA would be at acceptable risk  for the planned procedure without further cardiovascular testing.    His RCRI is low risk, 0.9% risk of major cardiac event.  He is able to complete greater than 4 METS of physical activity.  Patient was advised that if he develops new symptoms prior to surgery to contact our office to arrange a follow-up appointment.  He verbalized understanding.  Patient has not had an Afib/aflutter ablation within the last 3 months or DCCV within the last 30 days   Per office protocol, patient can hold Eliquis  for 3 days prior to procedure.    I will route this recommendation to the requesting party via Epic fax function and remove from pre-op pool.       Time:   Today, I have spent 6 minutes with the patient with telehealth technology discussing medical history, symptoms, and management plan.  I spent 10 minutes reviewing patient's past cardiac history and cardiac medications.    Josefa CHRISTELLA Beauvais, NP  10/31/2023, 1:28 PM

## 2023-11-02 ENCOUNTER — Ambulatory Visit: Attending: Cardiology

## 2023-11-02 DIAGNOSIS — Z01810 Encounter for preprocedural cardiovascular examination: Secondary | ICD-10-CM

## 2023-11-23 NOTE — Telephone Encounter (Signed)
 See previous note

## 2023-11-27 NOTE — Progress Notes (Signed)
Pt. Needs orders for surgery. 

## 2023-11-27 NOTE — Patient Instructions (Signed)
 SURGICAL WAITING ROOM VISITATION Patients having surgery or a procedure may have no more than 2 support people in the waiting area - these visitors may rotate in the visitor waiting room.   Due to an increase in RSV and influenza rates and associated hospitalizations, children ages 6 and under may not visit patients in [facility] hospitals. If the patient needs to stay at the hospital during part of their recovery, the visitor guidelines for inpatient rooms apply.  PRE-OP VISITATION  Pre-op nurse will coordinate an appropriate time for 1 support person to accompany the patient in pre-op.  This support person may not rotate.  This visitor will be contacted when the time is appropriate for the visitor to come back in the pre-op area.  Please refer to the [facility] website for the visitor guidelines for Inpatients (after your surgery is over and you are in a regular room).  You are not required to quarantine at this time prior to your surgery. However, you must do this: Hand Hygiene often Do NOT share personal items Notify your provider if you are in close contact with someone who has COVID or you develop fever 100.4 or greater, new onset of sneezing, cough, sore throat, shortness of breath or body aches.  If you test positive for Covid or have been in contact with anyone that has tested positive in the last 10 days please notify you surgeon.    Your procedure is scheduled on:  12/11/23  Report to [facility] Main Entrance: [place] entrance where the [employer] is available.   Report to admitting at: 10:00 AM  Call this number if you have any questions or problems the morning of surgery [phone]  FOLLOW ANY ADDITIONAL PRE OP INSTRUCTIONS YOU RECEIVED FROM YOUR SURGEON'S OFFICE!!!  Do not eat food after Midnight the night prior to your surgery/procedure.  After Midnight you may have the following liquids until: 9:30 AM DAY OF SURGERY  Clear Liquid Diet Water Black  Coffee (sugar ok, NO MILK/CREAM OR CREAMERS)  Tea (sugar ok, NO MILK/CREAM OR CREAMERS) regular and decaf                             Plain Jell-O  with no fruit (NO RED)                                           Fruit ices (not with fruit pulp, NO RED)                                     Popsicles (NO RED)                                                                  Juice: NO CITRUS JUICES: only apple, WHITE grape, WHITE cranberry Sports drinks like Gatorade or Powerade (NO RED)   The day of surgery:  Drink ONE (1) Pre-Surgery Clear Ensure at : 9:30 AM the morning of surgery. Drink in one sitting. Do not sip.  This drink was given  to you during your hospital pre-op appointment visit. Nothing else to drink after completing the Pre-Surgery Clear Ensure or G2 : No candy, chewing gum or throat lozenges.    Oral Hygiene is also important to reduce your risk of infection.        Remember - BRUSH YOUR TEETH THE MORNING OF SURGERY WITH YOUR REGULAR TOOTHPASTE  Do NOT smoke after Midnight the night before surgery.  STOP TAKING all Vitamins, Herbs and supplements 1 week before your surgery.   Take ONLY these medicines the morning of surgery with A SIP OF WATER: gabapentin ,metoprolol ,allopurinol ,tamsulosin ,famotidine,pantoprazole .  If You have been diagnosed with Sleep Apnea - Bring CPAP mask and tubing day of surgery. We will provide you with a CPAP machine on the day of your surgery.                   You may not have any metal on your body including hair pins, jewelry, and body piercing  Do not wear lotions, powders, perfumes / cologne, or deodorant  Men may shave face and neck.  Contacts, Hearing Aids, dentures or bridgework may not be worn into surgery. DENTURES WILL BE REMOVED PRIOR TO SURGERY PLEASE DO NOT APPLY Poly grip OR ADHESIVES!!!  You may bring a small overnight bag with you on the day of surgery, only pack items that are not valuable. [facility] IS NOT RESPONSIBLE   FOR  VALUABLES THAT ARE LOST OR STOLEN.   Patients discharged on the day of surgery will not be allowed to drive home.  Someone NEEDS to stay with you for the first 24 hours after anesthesia.  Do not bring your home medications to the hospital. The Pharmacy will dispense medications listed on your medication list to you during your admission in the Hospital.  Special Instructions: Bring a copy of your healthcare power of attorney and living will documents the day of surgery, if you wish to have them scanned into your [facility]- EPIC  Please read over the following fact sheets you were given: IF YOU HAVE QUESTIONS ABOUT YOUR PRE-OP INSTRUCTIONS, PLEASE CALL [phone]  PATIENT SIGNATURE_________________________________  NURSE SIGNATURE__________________________________  ________________________________________________________________________    Pre-operative 5 CHG Bath Instructions   You can play a key role in reducing the risk of infection after surgery. Your skin needs to be as free of germs as possible. You can reduce the number of germs on your skin by washing with CHG (chlorhexidine  gluconate) soap before surgery. CHG is an antiseptic soap that kills germs and continues to kill germs even after washing.   DO NOT use if you have an allergy to chlorhexidine /CHG or antibacterial soaps. If your skin becomes reddened or irritated, stop using the CHG and notify one of our RNs at [phone].   Please shower with the CHG soap starting 4 days before surgery using the following schedule:     Please keep in mind the following:  DO NOT shave, including legs and underarms, starting the day of your first shower.   You may shave your face at any point before/day of surgery.  Place clean sheets on your bed the day you start using CHG soap. Use a clean washcloth (not used since being washed) for each shower. DO NOT sleep with pets once you start using the CHG.   CHG Shower  Instructions:  If you choose to wash your hair and private area, wash first with your normal shampoo/soap.  After you use shampoo/soap, rinse your hair and body  thoroughly to remove shampoo/soap residue.  Turn the water OFF and apply about 3 tablespoons (45 ml) of CHG soap to a CLEAN washcloth.  Apply CHG soap ONLY FROM YOUR NECK DOWN TO YOUR TOES (washing for 3-5 minutes)  DO NOT use CHG soap on face, private areas, open wounds, or sores.  Pay special attention to the area where your surgery is being performed.  If you are having back surgery, having someone wash your back for you may be helpful. Wait 2 minutes after CHG soap is applied, then you may rinse off the CHG soap.  Pat dry with a clean towel  Put on clean clothes/pajamas   If you choose to wear lotion, please use ONLY the CHG-compatible lotions on the back of this paper.     Additional instructions for the day of surgery: DO NOT APPLY any lotions, deodorants, cologne, or perfumes.   Put on clean/comfortable clothes.  Brush your teeth.  Ask your nurse before applying any prescription medications to the skin.   CHG Compatible Lotions   Aveeno Moisturizing lotion  Cetaphil Moisturizing Cream  Cetaphil Moisturizing Lotion  Clairol Herbal Essence Moisturizing Lotion, Dry Skin  Clairol Herbal Essence Moisturizing Lotion, Extra Dry Skin  Clairol Herbal Essence Moisturizing Lotion, Normal Skin  Curel Age Defying Therapeutic Moisturizing Lotion with Alpha Hydroxy  Curel Extreme Care Body Lotion  Curel Soothing Hands Moisturizing Hand Lotion  Curel Therapeutic Moisturizing Cream, Fragrance-Free  Curel Therapeutic Moisturizing Lotion, Fragrance-Free  Curel Therapeutic Moisturizing Lotion, Original Formula  Eucerin Daily Replenishing Lotion  Eucerin Dry Skin Therapy Plus Alpha Hydroxy Crme  Eucerin Dry Skin Therapy Plus Alpha Hydroxy Lotion  Eucerin Original Crme  Eucerin Original Lotion  Eucerin Plus Crme Eucerin Plus  Lotion  Eucerin TriLipid Replenishing Lotion  Keri Anti-Bacterial Hand Lotion  Keri Deep Conditioning Original Lotion Dry Skin Formula Softly Scented  Keri Deep Conditioning Original Lotion, Fragrance Free Sensitive Skin Formula  Keri Lotion Fast Absorbing Fragrance Free Sensitive Skin Formula  Keri Lotion Fast Absorbing Softly Scented Dry Skin Formula  Keri Original Lotion  Keri Skin Renewal Lotion Keri Silky Smooth Lotion  Keri Silky Smooth Sensitive Skin Lotion  Nivea Body Creamy Conditioning Oil  Nivea Body Extra Enriched Lotion  Nivea Body Original Lotion  Nivea Body Sheer Moisturizing Lotion Nivea Crme  Nivea Skin Firming Lotion  NutraDerm 30 Skin Lotion  NutraDerm Skin Lotion  NutraDerm Therapeutic Skin Cream  NutraDerm Therapeutic Skin Lotion  ProShield Protective Hand Cream  Provon moisturizing lotion   Incentive Spirometer  An incentive spirometer is a tool that can help keep your lungs clear and active. This tool measures how well you are filling your lungs with each breath. Taking long deep breaths may help reverse or decrease the chance of developing breathing (pulmonary) problems (especially infection) following: A long period of time when you are unable to move or be active. BEFORE THE PROCEDURE  If the spirometer includes an indicator to show your best effort, your nurse or respiratory therapist will set it to a desired goal. If possible, sit up straight or lean slightly forward. Try not to slouch. Hold the incentive spirometer in an upright position. INSTRUCTIONS FOR USE  Sit on the edge of your bed if possible, or sit up as far as you can in bed or on a chair. Hold the incentive spirometer in an upright position. Breathe out normally. Place the mouthpiece in your mouth and seal your lips tightly around it. Breathe in slowly and as deeply as  possible, raising the piston or the ball toward the top of the column. Hold your breath for 3-5 seconds or for as long as  possible. Allow the piston or ball to fall to the bottom of the column. Remove the mouthpiece from your mouth and breathe out normally. Rest for a few seconds and repeat Steps 1 through 7 at least 10 times every 1-2 hours when you are awake. Take your time and take a few normal breaths between deep breaths. The spirometer may include an indicator to show your best effort. Use the indicator as a goal to work toward during each repetition. After each set of 10 deep breaths, practice coughing to be sure your lungs are clear. If you have an incision (the cut made at the time of surgery), support your incision when coughing by placing a pillow or rolled up towels firmly against it. Once you are able to get out of bed, walk around indoors and cough well. You may stop using the incentive spirometer when instructed by your caregiver.  RISKS AND COMPLICATIONS Take your time so you do not get dizzy or light-headed. If you are in pain, you may need to take or ask for pain medication before doing incentive spirometry. It is harder to take a deep breath if you are having pain. AFTER USE Rest and breathe slowly and easily. It can be helpful to keep track of a log of your progress. Your caregiver can provide you with a simple table to help with this. If you are using the spirometer at home, follow these instructions: SEEK MEDICAL CARE IF:  You are having difficultly using the spirometer. You have trouble using the spirometer as often as instructed. Your pain medication is not giving enough relief while using the spirometer. You develop fever of 100.5 F (38.1 C) or higher. SEEK IMMEDIATE MEDICAL CARE IF:  You cough up bloody sputum that had not been present before. You develop fever of 102 F (38.9 C) or greater. You develop worsening pain at or near the incision site. MAKE SURE YOU:  Understand these instructions. Will watch your condition. Will get help right away if you are not doing well or get  worse. Document Released: 07/31/2006 Document Revised: 06/12/2011 Document Reviewed: 10/01/2006 [facility] Patient Information 2014 [place], [place].   ________________________________________________________________________

## 2023-11-28 ENCOUNTER — Other Ambulatory Visit: Payer: Self-pay

## 2023-11-28 ENCOUNTER — Encounter (HOSPITAL_COMMUNITY)
Admission: RE | Admit: 2023-11-28 | Discharge: 2023-11-28 | Disposition: A | Discharge status: Home / Self Care | Source: Ambulatory Visit | Attending: Orthopaedic Surgery

## 2023-11-28 ENCOUNTER — Encounter (HOSPITAL_COMMUNITY): Payer: Self-pay

## 2023-11-28 VITALS — BP 122/74 | HR 51 | Temp 98.1°F | Ht 73.0 in | Wt 210.0 lb

## 2023-11-28 DIAGNOSIS — D649 Anemia, unspecified: Secondary | ICD-10-CM | POA: Diagnosis not present

## 2023-11-28 DIAGNOSIS — F1011 Alcohol abuse, in remission: Secondary | ICD-10-CM | POA: Insufficient documentation

## 2023-11-28 DIAGNOSIS — I4891 Unspecified atrial fibrillation: Secondary | ICD-10-CM | POA: Diagnosis not present

## 2023-11-28 DIAGNOSIS — I5042 Chronic combined systolic (congestive) and diastolic (congestive) heart failure: Secondary | ICD-10-CM | POA: Insufficient documentation

## 2023-11-28 DIAGNOSIS — G8929 Other chronic pain: Secondary | ICD-10-CM | POA: Diagnosis not present

## 2023-11-28 DIAGNOSIS — K219 Gastro-esophageal reflux disease without esophagitis: Secondary | ICD-10-CM | POA: Insufficient documentation

## 2023-11-28 DIAGNOSIS — F329 Major depressive disorder, single episode, unspecified: Secondary | ICD-10-CM | POA: Insufficient documentation

## 2023-11-28 DIAGNOSIS — M1711 Unilateral primary osteoarthritis, right knee: Secondary | ICD-10-CM | POA: Diagnosis not present

## 2023-11-28 DIAGNOSIS — F1120 Opioid dependence, uncomplicated: Secondary | ICD-10-CM | POA: Insufficient documentation

## 2023-11-28 DIAGNOSIS — Z01812 Encounter for preprocedural laboratory examination: Secondary | ICD-10-CM | POA: Insufficient documentation

## 2023-11-28 DIAGNOSIS — Z01818 Encounter for other preprocedural examination: Secondary | ICD-10-CM

## 2023-11-28 DIAGNOSIS — Z7901 Long term (current) use of anticoagulants: Secondary | ICD-10-CM | POA: Insufficient documentation

## 2023-11-28 DIAGNOSIS — Z79899 Other long term (current) drug therapy: Secondary | ICD-10-CM | POA: Diagnosis not present

## 2023-11-28 DIAGNOSIS — I5043 Acute on chronic combined systolic (congestive) and diastolic (congestive) heart failure: Secondary | ICD-10-CM

## 2023-11-28 HISTORY — DX: Cardiac arrhythmia, unspecified: I49.9

## 2023-11-28 HISTORY — DX: Malignant (primary) neoplasm, unspecified: C80.1

## 2023-11-28 HISTORY — DX: Heart failure, unspecified: I50.9

## 2023-11-28 LAB — CBC
HCT: 32.2 % — ABNORMAL LOW (ref 39.0–52.0)
Hemoglobin: 10.6 g/dL — ABNORMAL LOW (ref 13.0–17.0)
MCH: 34.6 pg — ABNORMAL HIGH (ref 26.0–34.0)
MCHC: 32.9 g/dL (ref 30.0–36.0)
MCV: 105.2 fL — ABNORMAL HIGH (ref 80.0–100.0)
Platelets: 170 K/uL (ref 150–400)
RBC: 3.06 MIL/uL — ABNORMAL LOW (ref 4.22–5.81)
RDW: 13.9 % (ref 11.5–15.5)
WBC: 3.5 K/uL — ABNORMAL LOW (ref 4.0–10.5)
nRBC: 0 % (ref 0.0–0.2)

## 2023-11-28 LAB — SURGICAL PCR SCREEN
MRSA, PCR: NEGATIVE
Staphylococcus aureus: NEGATIVE

## 2023-11-28 LAB — BASIC METABOLIC PANEL WITH GFR
Anion gap: 10 (ref 5–15)
BUN: 13 mg/dL (ref 8–23)
CO2: 29 mmol/L (ref 22–32)
Calcium: 8.9 mg/dL (ref 8.9–10.3)
Chloride: 100 mmol/L (ref 98–111)
Creatinine, Ser: 0.79 mg/dL (ref 0.61–1.24)
GFR, Estimated: >60 mL/min (ref >60)
Glucose, Bld: 106 mg/dL — ABNORMAL HIGH (ref 70–99)
Potassium: 4.2 mmol/L (ref 3.5–5.1)
Sodium: 139 mmol/L (ref 135–145)

## 2023-11-28 NOTE — Progress Notes (Addendum)
 For Anesthesia: [facility]  Cardiologist - Court, Dorn PARAS, MD  Clearance:Cleaver, Josefa HERO, NP : 11/02/23 Bowel Prep reminder:N/A  Chest x-ray -  EKG -06/20/23  Stress Test -  ECHO - 05/04/23 Cardiac Cath -  Pacemaker/ICD device last checked: Pacemaker orders received: Device Rep notified:  Spinal Cord Stimulator:N/A  Sleep Study - N/A CPAP -   Fasting Blood Sugar - N/A Checks Blood Sugar _____ times a day Date and result of last Hgb A1c-  Last dose of GLP1 agonist- N/A GLP1 instructions:   Last dose of SGLT-2 inhibitors- N/A SGLT-2 instructions:   Blood Thinner Instructions: Eliquis  will be hold after: 12/07/23 Aspirin  Instructions: Last Dose:  Activity level: Can go up a flight of stairs and activities of daily living without stopping and without chest pain and/or shortness of breath   Able to exercise without chest pain and/or shortness of breath    Anesthesia review: Hx: Afib,CHF,Pulmonary HTN.  Patient denies shortness of breath, fever, cough and chest pain at PAT appointment   Patient verbalized understanding of instructions that were reviewed over the telephone.

## 2023-11-29 ENCOUNTER — Encounter (HOSPITAL_COMMUNITY): Payer: Self-pay

## 2023-11-29 NOTE — Progress Notes (Signed)
 Case: [id number] Date/Time: 12/11/23 1221   Procedure: ARTHROPLASTY, KNEE, TOTAL (Right: Knee)   Anesthesia type: Spinal   Pre-op diagnosis: right knee degenerative joint disease   [facility]: [facility] ROOM 06 / [facility]   Surgeons: Sheril Coy, MD       DISCUSSION: [name] is a 78 yo male with PMH of A.fib on Eliquis , combined systolic/diastolic CHF, GERD, arthritis, depression, anemia, hx of ETOH abuse currently in remission, chronic pain with narcotic dependence.  Patient follows with Cardiology for hx of A.fib and CHF. A.fib is persistant. Takes Eliquis  and is rate controlled with Metoprolol . Has had failed DCCV in 2020. Echo in 04/2023 showed normal LVEF, mild RV dysfunction, mild MR. Last seen by Dr. Court on 06/20/23. Stable at that visit. Advised f/u in 1 year. Had cardiac clearance visit on 11/02/23 and was cleared:  Chart reviewed as part of pre-operative protocol coverage. Given past medical history and time since last visit, based on ACC/AHA guidelines, [name] would be at acceptable risk for the planned procedure without further cardiovascular testing.  His RCRI is low risk, 0.9% risk of major cardiac event.  He is able to complete greater than 4 METS of physical activity.  Follows with his PCP at the [facility]. Last seen on 10/23/23 for chronic pain issues. Noted to have early cirrhosis. Also patient noted to have a large right pulmonary effusion which he has had in the past. Per PCP: Today we spent almost 40 minutes trying to figure out why he has so much extracellular fluid everywhere. Leg 3-4+, some ascites, pulmonary effusions. 1) HEart-- BNP only 251, cardiac MRI with some right heart findings but nothing explaining all this fluid. 2) eGFR > 80. 3) Albumin 4.1 despite liver history  Plan is to f/u chest CT in another 3 months (~01/2024).  PCP clearance scanned in media on 9/3  VS: BP 122/74   Pulse (!) 51   Temp 36.7 C (Oral)   Ht 6' 1 (1.854 m)   Wt 95.3 kg   SpO2 98%    BMI 27.71 kg/m   PROVIDERS: [facility]   LABS: Labs reviewed: Acceptable for surgery. (all labs ordered are listed, but only abnormal results are displayed)  Labs Reviewed  BASIC METABOLIC PANEL WITH GFR - Abnormal; Notable for the following components:      Result Value   Glucose, Bld 106 (*)    All other components within normal limits  CBC - Abnormal; Notable for the following components:   WBC 3.5 (*)    RBC 3.06 (*)    Hemoglobin 10.6 (*)    HCT 32.2 (*)    MCV 105.2 (*)    MCH 34.6 (*)    All other components within normal limits  SURGICAL PCR SCREEN     IMAGES:   EKG 06/20/23:  Atrial flutter with variable A-V block with premature ventricular or aberrantly conducted complexes, rate 60 When compared with ECG of 06-Sep-2021 13:48, No significant change was found23:55, axis has shifted left  CV:  Cardiac MRI 10/11/23:  IMPRESSION: 1. Normal LV size, no hypertrophy, and low normal systolic function (EF 50%)   2.  Mild RV dilatation with mild systolic dysfunction (EF 45%)   3. Basal inferolateral and apical septal midwall LGE. This is a nonischemic scar pattern. Also with RV insertion site LGE, which is a nonspecific scar pattern often seen in setting of elevated pulmonary pressures. Differential includes prior myocarditis, but sarcoidosis would also be on differential given multifocal  nonischemic LGE. Recommend CT chest to evaluate for pulmonary sarcoid and FDG-PET to evaluate for cardiac sarcoid.   4.  Severe biatrial enlargement   5.  Dilated ascending aorta measuring 40mm   6.  Dilated main pulmonary artery measuring 34mm   7.  Large right pleural effusion    Echo 05/04/23:  IMPRESSIONS    1. Left ventricular ejection fraction, by estimation, is 50 to 55%. Left ventricular ejection fraction by 3D volume is 55 %. The left ventricle has low normal function. The left ventricle has no regional wall motion abnormalities. Left  ventricular diastolic parameters were normal. The average left ventricular global longitudinal strain is -17.9 %. The global longitudinal strain is normal.  2. Right ventricular systolic function is mildly reduced. The right ventricular size is moderately enlarged. A Prominent RV moderator band is visualized. There is normal pulmonary artery systolic pressure. The estimated right ventricular systolic pressure is 27.0 mmHg.  3. Left atrial size was severely dilated.  4. Right atrial size was severely dilated.  5. The mitral valve is degenerative. Mild mitral valve regurgitation. No evidence of mitral stenosis. Moderate mitral annular calcification.  6. The aortic valve is tricuspid. There is severe calcifcation of the aortic valve. There is severe thickening of the aortic valve. Aortic valve regurgitation is not visualized. Aortic valve sclerosis/calcification is present, without any evidence of aortic stenosis.  7. The inferior vena cava is dilated in size with >50% respiratory variability, suggesting right atrial pressure of 8 mmHg. Past Medical History:  Diagnosis Date   Atrial fibrillation with rapid ventricular response (HCC) 10/25/2018   Successful  DCCV shock x1 200 J 11/04/2018   Cancer (HCC)    skin   CHF (congestive heart failure) (HCC)    Chronic back pain    buldging disc   Depression    Dysrhythmia    GERD (gastroesophageal reflux disease)    GOUT    takes Allopurinol  daily and Colchicine  as needed   History of bronchitis    many yrs ago   History of colonoscopy    benign   History of shingles    Joint pain    Joint swelling    Muscle spasm    takes Robaxin  as needed   Nocturia    OSTEOARTHRITIS    Peripheral edema    takes Lasix  daily as needed   Pneumonia 1969   Seasonal allergies    takes Zyrtec and uses Flonase  is needed   TESTICULAR HYPOFUNCTION    Zenker diverticula     Past Surgical History:  Procedure Laterality Date   ANKLE SURGERY     4  times   BIOPSY  07/22/2021   Procedure: BIOPSY;  Surgeon: Eda Iha, MD;  Location: [facility] ENDOSCOPY;  Service: Gastroenterology;;   CAPSULOTOMY Right 08/20/2019   Procedure: SCAR CONTRACTURE AND CAPSULE RELEASE OF RIGHT BREAST TISSUE;  Surgeon: Lowery Estefana RAMAN, DO;  Location: [facility];  Service: Plastics;  Laterality: Right;   CARDIOVERSION N/A 11/04/2018   Procedure: CARDIOVERSION;  Surgeon: Alveta, Aleene PARAS, MD;  Location[facility] ENDOSCOPY;  Service: Cardiovascular;  Laterality: N/A;   COLONOSCOPY     ELBOW SURGERY Right    torn tendon   ESOPHAGOGASTRODUODENOSCOPY (EGD) WITH PROPOFOL  N/A 07/22/2021   Procedure: ESOPHAGOGASTRODUODENOSCOPY (EGD) WITH PROPOFOL ;  Surgeon: Eda Iha, MD;  Location: [facility] ENDOSCOPY;  Service: Gastroenterology;  Laterality: N/A;   INGUINAL HERNIA REPAIR     right   KNEE SURGERY     3 on each  knee   ORIF HIP FRACTURE Right    SCAR REVISION Right 08/20/2019   Procedure: SCAR REVISION;  Surgeon: Lowery Estefana RAMAN, DO;  Location: [facility];  Service: Plastics;  Laterality: Right;   SHOULDER ARTHROSCOPY Right 06/15/2015   Procedure: RIGHT SHOULDER ARTHROSCOPY, ACROMIOPLASTY, DEBRIDEMENT;  Surgeon: Maude Herald, MD;  Location: [facility] OR;  Service: Orthopedics;  Laterality: Right;   shoulder arthrosopy Left    TONSILLECTOMY AND ADENOIDECTOMY     TUMOR EXCISION     sebaceous cyst removed from chest   ZENKER'S DIVERTICULECTOMY ENDOSCOPIC N/A 06/14/2016   Procedure: ZENKER'S DIVERTICULECTOMY ENDOSCOPIC;  Surgeon: Ida Loader, MD;  Location: [facility] OR;  Service: ENT;  Laterality: N/A;    MEDICATIONS:  allopurinol  (ZYLOPRIM ) 300 MG tablet   apixaban  (ELIQUIS ) 5 MG TABS tablet   CALCIUM  600 1500 (600 Ca) MG TABS tablet   cholecalciferol (VITAMIN D3) 25 MCG (1000 UNIT) tablet   clotrimazole (LOTRIMIN) 1 % cream   cromolyn (NASALCROM) 5.2 MG/ACT nasal spray   famotidine (PEPCID) 20 MG tablet   furosemide  (LASIX ) 20 MG tablet    gabapentin  (NEURONTIN ) 100 MG capsule   magnesium  oxide (MAG-OX) 400 MG tablet   metoprolol  tartrate (LOPRESSOR ) 25 MG tablet   morphine  (MS CONTIN ) 30 MG 12 hr tablet   oxyCODONE  (OXY IR/ROXICODONE ) 5 MG immediate release tablet   sildenafil  (VIAGRA ) 100 MG tablet   tamsulosin  (FLOMAX ) 0.4 MG CAPS capsule   Testosterone  20.25 MG/ACT (1.62%) GEL   triamcinolone  cream (KENALOG ) 0.1 %   vitamin B-12 (CYANOCOBALAMIN ) 1000 MCG tablet   No current facility-administered medications for this encounter.   Burnard CHRISTELLA Odis DEVONNA [facility] Surgical Short Stay/Anesthesiology [facility] Phone [phone] 12/04/2023 11:53 AM

## 2023-11-30 ENCOUNTER — Other Ambulatory Visit: Payer: Self-pay | Admitting: Orthopaedic Surgery

## 2023-12-04 NOTE — Care Plan (Addendum)
 Ortho Bundle Case Management Note  Patient Details  Name: [name] MRN: [id number] Date of Birth: 07/22/1945   Patient will discharge to home with family to assist. Has rolling walker at home. OPPT set up with [facility] [facility] Discharge instructions discussed and questions answered. Patient and MD in agreement with plan. Choice offered.                   DME Arranged:  DME Agency:    HH Arranged:    HH Agency:     Additional Comments: Please contact me with any questions of if this plan should need to change.  Charlies Pitch,  RN,BSN,MHA,CCM  [facility] Orthopaedic Specialist  [phone] 12/04/2023, 1:05 PM

## 2023-12-05 NOTE — Anesthesia Preprocedure Evaluation (Addendum)
 Anesthesia Evaluation  Patient identified by MRN, date of birth, ID band Patient awake    Reviewed: Allergy & Precautions, NPO status , Patient's Chart, lab work & pertinent test results  History of Anesthesia Complications Negative for: history of anesthetic complications  Airway Mallampati: II  TM Distance: >3 FB Neck ROM: Full    Dental no notable dental hx.    Pulmonary neg pulmonary ROS   Pulmonary exam normal        Cardiovascular +CHF  Normal cardiovascular exam+ dysrhythmias (on Eliquis ) Atrial Fibrillation      Neuro/Psych    Depression       GI/Hepatic Neg liver ROS, PUD,GERD  Medicated,,  Endo/Other  negative endocrine ROS    Renal/GU negative Renal ROS     Musculoskeletal  (+) Arthritis  (Taking MS Contin  30mg  TID, oxycodone  10mg  Q4h),    Abdominal   Peds  Hematology  (+) Blood dyscrasia (Hgb 10.6), anemia   Anesthesia Other Findings   Reproductive/Obstetrics                              Anesthesia Physical Anesthesia Plan  ASA: 3  Anesthesia Plan: General   Post-op Pain Management: Regional block*, Tylenol  PO (pre-op)*, Dilaudid  IV and Ketamine  IV*   Induction: Intravenous  PONV Risk Score and Plan: 2 and Treatment may vary due to age or medical condition, Ondansetron , Dexamethasone  and Midazolam   Airway Management Planned: LMA  Additional Equipment: None  Intra-op Plan:   Post-operative Plan: Extubation in OR  Informed Consent: I have reviewed the patients History and Physical, chart, labs and discussed the procedure including the risks, benefits and alternatives for the proposed anesthesia with the patient or authorized representative who has indicated his/her understanding and acceptance.     Dental advisory given  Plan Discussed with: CRNA  Anesthesia Plan Comments: (Discussed R/B/A of spinal vs GA and patient prefers GA. Lawence, MD)          Anesthesia Quick Evaluation

## 2023-12-10 NOTE — H&P (Signed)
 TOTAL KNEE ADMISSION H&P  Patient is being admitted for right total knee arthroplasty.  Subjective:  Chief Complaint:right knee pain.  HPI: [name], 78 y.o. male, has a history of pain and functional disability in the right knee due to arthritis and has failed non-surgical conservative treatments for greater than 12 weeks to includeNSAID's and/or analgesics, corticosteriod injections, viscosupplementation injections, flexibility and strengthening excercises, supervised PT with diminished ADL's post treatment, use of assistive devices, weight reduction as appropriate, and activity modification.  Onset of symptoms was gradual, starting 5 years ago with gradually worsening course since that time. The patient noted prior procedures on the knee to include  arthroscopy on the right knee(s).  Patient currently rates pain in the right knee(s) at 10 out of 10 with activity. Patient has night pain, worsening of pain with activity and weight bearing, pain that interferes with activities of daily living, crepitus, and joint swelling.  Patient has evidence of subchondral cysts, subchondral sclerosis, periarticular osteophytes, and joint space narrowing by imaging studies. There is no active infection.  Patient Active Problem List   Diagnosis Date Noted   Alcoholic cirrhosis (HCC) 06/15/2023   Pleural effusion, not elsewhere classified 06/15/2023   Dermatochalasis 08/22/2022   Pulmonary hypertension, unspecified (HCC) 07/19/2022   Melena    Duodenal ulcer    GI bleed 07/21/2021   Secondary hypercoagulable state (HCC) 01/28/2020   Brow ptosis, bilateral 09/12/2019   Lower extremity edema 09/09/2019   Breast pain 06/17/2019   Acquired absence of nipple, right 06/17/2019   Anticoagulated 10/25/2018   Acute on chronic combined systolic and diastolic CHF (congestive heart failure) (HCC) 10/25/2018   Chronic pain 10/12/2018   Macrocytic anemia 10/12/2018   Atrial fibrillation (HCC) 10/12/2018    Atrial fibrillation with RVR (HCC) 10/11/2018   Oropharyngeal dysphagia 08/14/2017   Sore throat 07/21/2016   Vasomotor rhinitis 06/23/2016   Zenker's diverticulum 06/14/2016   Chronic venous insufficiency 06/28/2015   Vitamin B12 deficiency 02/07/2013   History of colonic polyps 09/15/2010   Weakness generalized 06/09/2010   TESTICULAR HYPOFUNCTION 01/04/2010   PULMONARY NODULE, LEFT LOWER LOBE 01/04/2010   Allergic rhinitis 06/17/2009   WEIGHT LOSS 03/17/2009   GOUT 05/04/2008   Osteoarthritis 05/04/2008   LOW BACK PAIN 05/04/2008   Past Medical History:  Diagnosis Date   Atrial fibrillation with rapid ventricular response (HCC) 10/25/2018   Successful  DCCV shock x1 200 J 11/04/2018   Cancer (HCC)    skin   CHF (congestive heart failure) (HCC)    Chronic back pain    buldging disc   Depression    Dysrhythmia    GERD (gastroesophageal reflux disease)    GOUT    takes Allopurinol  daily and Colchicine  as needed   History of bronchitis    many yrs ago   History of colonoscopy    benign   History of shingles    Joint pain    Joint swelling    Muscle spasm    takes Robaxin  as needed   Nocturia    OSTEOARTHRITIS    Peripheral edema    takes Lasix  daily as needed   Pneumonia 1969   Seasonal allergies    takes Zyrtec and uses Flonase  is needed   TESTICULAR HYPOFUNCTION    Zenker diverticula     Past Surgical History:  Procedure Laterality Date   ANKLE SURGERY     4 times   BIOPSY  07/22/2021   Procedure: BIOPSY;  Surgeon: Eda Iha, MD;  Location:  [facility];  Service: Gastroenterology;;   CAPSULOTOMY Right 08/20/2019   Procedure: SCAR CONTRACTURE AND CAPSULE RELEASE OF RIGHT BREAST TISSUE;  Surgeon: Lowery Estefana RAMAN, DO;  Location: [facility];  Service: Plastics;  Laterality: Right;   CARDIOVERSION N/A 11/04/2018   Procedure: CARDIOVERSION;  Surgeon: Alveta, Aleene PARAS, MD;  Location: [facility] END[facility]COPY;  Service: Cardiovascular;  Laterality:  N/A;   COLONOSCOPY     ELBOW SURGERY Right    torn tendon   ESOPHAGOGASTRODUODENOSCOPY (EGD) WITH PROPOFOL  N/A 07/22/2021   Procedure: ESOPHAGOGASTRODUODENOSCOPY (EGD) WITH PROPOFOL ;  Surgeon: Eda Iha, MD;  Location: [facility];  Service: Gastroenterology;  Laterality: N/A;   INGUINAL HERNIA REPAIR     right   KNEE SURGERY     3 on each knee   ORIF HIP FRACTURE Right    SCAR REVISION Right 08/20/2019   Procedure: SCAR REVISION;  Surgeon: Lowery Estefana RAMAN, DO;  Location: [facility];  Service: Plastics;  Laterality: Right;   SHOULDER ARTHROSCOPY Right 06/15/2015   Procedure: RIGHT SHOULDER ARTHROSCOPY, ACROMIOPLASTY, DEBRIDEMENT;  Surgeon: Maude Herald, MD;  Location: [facility] OR;  Service: Orthopedics;  Laterality: Right;   shoulder arthrosopy Left    TONSILLECTOMY AND ADENOIDECTOMY     TUMOR EXCISION     sebaceous cyst removed from chest   ZENKER'S DIVERTICULECTOMY ENDOSCOPIC N/A 06/14/2016   Procedure: ZENKER'S DIVERTICULECTOMY ENDOSCOPIC;  Surgeon: Ida Loader, MD;  Location: [facility] OR;  Service: ENT;  Laterality: N/A;    No current facility-administered medications for this encounter.   Current Outpatient Medications  Medication Sig Dispense Refill Last Dose/Taking   allopurinol  (ZYLOPRIM ) 300 MG tablet Take 600 mg by mouth every morning.   Taking   apixaban  (ELIQUIS ) 5 MG TABS tablet Take 1 tablet (5 mg total) by mouth 2 (two) times daily. 60 tablet 6 Taking   CALCIUM  600 1500 (600 Ca) MG TABS tablet Take 600 mg of elemental calcium  by mouth 2 (two) times daily.    Taking   cholecalciferol (VITAMIN D3) 25 MCG (1000 UNIT) tablet Take 1,000 Units by mouth daily.   Taking   clotrimazole (LOTRIMIN) 1 % cream Apply 1 application  topically daily as needed (irritation).   Taking As Needed   cromolyn (NASALCROM) 5.2 MG/ACT nasal spray Place 1 spray into both nostrils 2 (two) times daily as needed for allergies or rhinitis.   Taking As Needed   famotidine  (PEPCID )  20 MG tablet Take 20 mg by mouth 2 (two) times daily.   Taking   furosemide  (LASIX ) 20 MG tablet Take 2 tablets (40 mg total) by mouth daily. 180 tablet 3 Taking   gabapentin  (NEURONTIN ) 100 MG capsule Take 200 mg by mouth 2 (two) times daily. Noon and midnight   Taking   magnesium  oxide (MAG-OX) 400 MG tablet Take 400 mg by mouth 2 (two) times daily.   Taking   metoprolol  tartrate (LOPRESSOR ) 25 MG tablet Take 1 tablet (25 mg total) by mouth 2 (two) times daily. Hold SYSTOLIC BP <100, HR <60 30 tablet 0 Taking   morphine  (MS CONTIN ) 30 MG 12 hr tablet Take 30 mg by mouth 3 (three) times daily.   Taking   oxyCODONE  (OXY IR/ROXICODONE ) 5 MG immediate release tablet Take 1 tablet (5 mg total) by mouth every 4 (four) hours as needed for severe pain. In addition to current chronic pain medication (Patient taking differently: Take 10 mg by mouth every 4 (four) hours as needed for severe pain (pain score 7-10). In addition  to current chronic pain medication) 30 tablet 0 Taking Differently   tamsulosin  (FLOMAX ) 0.4 MG CAPS capsule Take 0.4 mg by mouth at bedtime.   Taking   Testosterone  20.25 MG/ACT (1.62%) GEL Apply 20.25 mg topically daily. Apply to shoulders   Taking   triamcinolone  cream (KENALOG ) 0.1 % Apply 1 Application topically 2 (two) times daily as needed (itching).   Taking As Needed   vitamin B-12 (CYANOCOBALAMIN ) 1000 MCG tablet Take 1,000 mcg by mouth daily.   Taking   sildenafil  (VIAGRA ) 100 MG tablet Take 100 mg by mouth daily as needed for erectile dysfunction.      No Known Allergies  Social History   Tobacco Use   Smoking status: Never   Smokeless tobacco: Never  Substance Use Topics   Alcohol use: Yes    Alcohol/week: 28.0 standard drinks of alcohol    Types: 14 Glasses of wine, 14 Cans of beer per week    Comment: daily    Family History  Problem Relation Age of Onset   Colon cancer Neg Hx    Stomach cancer Neg Hx    Esophageal cancer Neg Hx      Review of Systems   Musculoskeletal:  Positive for arthralgias.       Right knee  All other systems reviewed and are negative.   Objective:  Physical Exam Constitutional:      Appearance: Normal appearance.  HENT:     Head: Normocephalic and atraumatic.     Mouth/Throat:     Pharynx: Oropharynx is clear.  Eyes:     Extraocular Movements: Extraocular movements intact.  Pulmonary:     Effort: Pulmonary effort is normal.  Abdominal:     Palpations: Abdomen is soft.  Musculoskeletal:     Cervical back: Normal range of motion.     Comments: Knee motion is about 0-80 on both sides.  He has joint line pain and crepitation but no effusion on either side.  His pitting edema at this point is only 1+ on both sides.    Skin:    General: Skin is warm and dry.  Neurological:     General: No focal deficit present.     Mental Status: He is alert and oriented to person, place, and time.  Psychiatric:        Mood and Affect: Mood normal.        Behavior: Behavior normal.        Thought Content: Thought content normal.        Judgment: Judgment normal.     Vital signs in last 24 hours:    Labs:   Estimated body mass index is 27.71 kg/m as calculated from the following:   Height as of 11/28/23: 6' 1 (1.854 m).   Weight as of 11/28/23: 95.3 kg.   Imaging Review Plain radiographs demonstrate severe degenerative joint disease of the right knee(s). The overall alignment isneutral. The bone quality appears to be good for age and reported activity level.      Assessment/Plan:  End stage primary arthritis, right knee   The patient history, physical examination, clinical judgment of the provider and imaging studies are consistent with end stage degenerative joint disease of the right knee(s) and total knee arthroplasty is deemed medically necessary. The treatment options including medical management, injection therapy arthroscopy and arthroplasty were discussed at length. The risks and benefits of total  knee arthroplasty were presented and reviewed. The risks due to aseptic loosening, infection, stiffness, patella tracking  problems, thromboembolic complications and other imponderables were discussed. The patient acknowledged the explanation, agreed to proceed with the plan and consent was signed. Patient is being admitted for inpatient treatment for surgery, pain control, PT, OT, prophylactic antibiotics, VTE prophylaxis, progressive ambulation and ADL's and discharge planning. The patient is planning to be discharged home with home health services     Patient's anticipated LOS is less than 2 midnights, meeting these requirements: - Younger than 53 - Lives within 1 hour of care - Has a competent adult at home to recover with post-op recover - NO history of  - Chronic pain requiring opiods  - Diabetes  - Coronary Artery Disease  - Heart failure  - Heart attack  - Stroke  - DVT/VTE  - Cardiac arrhythmia  - Respiratory Failure/COPD  - Renal failure  - Anemia  - Advanced Liver disease

## 2023-12-11 ENCOUNTER — Encounter (HOSPITAL_COMMUNITY): Payer: Self-pay | Admitting: Orthopaedic Surgery

## 2023-12-11 ENCOUNTER — Observation Stay (HOSPITAL_COMMUNITY)
Admission: RE | Admit: 2023-12-11 | Discharge: 2023-12-12 | Disposition: A | Discharge status: Home / Self Care | Attending: Orthopaedic Surgery | Admitting: Orthopaedic Surgery

## 2023-12-11 ENCOUNTER — Ambulatory Visit (HOSPITAL_BASED_OUTPATIENT_CLINIC_OR_DEPARTMENT_OTHER): Payer: Self-pay | Admitting: Registered Nurse

## 2023-12-11 ENCOUNTER — Other Ambulatory Visit: Payer: Self-pay

## 2023-12-11 ENCOUNTER — Ambulatory Visit (HOSPITAL_COMMUNITY): Payer: Self-pay | Admitting: Medical

## 2023-12-11 ENCOUNTER — Encounter (HOSPITAL_COMMUNITY)
Admission: RE | Disposition: A | Discharge status: Home / Self Care | Payer: Self-pay | Source: Home / Self Care | Attending: Orthopaedic Surgery

## 2023-12-11 DIAGNOSIS — F1090 Alcohol use, unspecified, uncomplicated: Secondary | ICD-10-CM | POA: Insufficient documentation

## 2023-12-11 DIAGNOSIS — I4891 Unspecified atrial fibrillation: Secondary | ICD-10-CM

## 2023-12-11 DIAGNOSIS — I509 Heart failure, unspecified: Secondary | ICD-10-CM | POA: Diagnosis not present

## 2023-12-11 DIAGNOSIS — M1711 Unilateral primary osteoarthritis, right knee: Secondary | ICD-10-CM | POA: Diagnosis present

## 2023-12-11 DIAGNOSIS — I5043 Acute on chronic combined systolic (congestive) and diastolic (congestive) heart failure: Secondary | ICD-10-CM

## 2023-12-11 HISTORY — PX: TOTAL KNEE ARTHROPLASTY: SHX125

## 2023-12-11 SURGERY — ARTHROPLASTY, KNEE, TOTAL
Anesthesia: General | Site: Knee | Laterality: Right

## 2023-12-11 MED ORDER — DOCUSATE SODIUM 100 MG PO CAPS
100.0000 mg | ORAL_CAPSULE | Freq: Two times a day (BID) | ORAL | Status: DC
  Administered 2023-12-11 – 2023-12-12 (×2): 100 mg via ORAL
  Filled 2023-12-11 (×2): qty 1

## 2023-12-11 MED ORDER — PHENOL 1.4 % MT LIQD: 1.0000 | OROMUCOSAL | Status: DC | PRN

## 2023-12-11 MED ORDER — TAMSULOSIN HCL 0.4 MG PO CAPS
0.4000 mg | ORAL_CAPSULE | Freq: Every day | ORAL | Status: DC
Start: 2023-12-12 — End: 2023-12-12

## 2023-12-11 MED ORDER — ACETAMINOPHEN 500 MG PO TABS
1000.0000 mg | ORAL_TABLET | Freq: Once | ORAL | Status: AC
  Administered 2023-12-11: 1000 mg via ORAL
  Filled 2023-12-11: qty 2

## 2023-12-11 MED ORDER — POVIDONE-IODINE 10 % EX SWAB
2.0000 | Freq: Once | CUTANEOUS | Status: DC
Start: 2023-12-11 — End: 2023-12-11

## 2023-12-11 MED ORDER — LACTATED RINGERS IV SOLN: INTRAVENOUS | Status: DC

## 2023-12-11 MED ORDER — SODIUM CHLORIDE (PF) 0.9 % IJ SOLN
INTRAMUSCULAR | Status: AC
Start: 2023-12-11 — End: 2023-12-11
  Filled 2023-12-11: qty 30

## 2023-12-11 MED ORDER — OXYCODONE HCL 5 MG PO TABS
10.0000 mg | ORAL_TABLET | ORAL | Status: DC | PRN
  Administered 2023-12-12: 10 mg via ORAL
  Filled 2023-12-11: qty 2

## 2023-12-11 MED ORDER — MIDAZOLAM HCL 2 MG/2ML IJ SOLN: 1.0000 mg | Freq: Once | INTRAMUSCULAR | Status: DC

## 2023-12-11 MED ORDER — MENTHOL 3 MG MT LOZG: 1.0000 | LOZENGE | OROMUCOSAL | Status: DC | PRN

## 2023-12-11 MED ORDER — TRANEXAMIC ACID 1000 MG/10ML IV SOLN
INTRAVENOUS | Status: DC | PRN
  Administered 2023-12-11: 2000 mg via TOPICAL

## 2023-12-11 MED ORDER — TRANEXAMIC ACID-NACL 1000-0.7 MG/100ML-% IV SOLN
1000.0000 mg | Freq: Once | INTRAVENOUS | Status: AC
  Administered 2023-12-11: 1000 mg via INTRAVENOUS
  Filled 2023-12-11: qty 100

## 2023-12-11 MED ORDER — PROPOFOL 10 MG/ML IV BOLUS
INTRAVENOUS | Status: AC
  Filled 2023-12-11: qty 20

## 2023-12-11 MED ORDER — ONDANSETRON HCL 4 MG PO TABS: 4.0000 mg | ORAL_TABLET | Freq: Four times a day (QID) | ORAL | Status: DC | PRN

## 2023-12-11 MED ORDER — BUPIVACAINE LIPOSOME 1.3 % IJ SUSP: 20.0000 mL | Freq: Once | INTRAMUSCULAR | Status: DC

## 2023-12-11 MED ORDER — FUROSEMIDE 40 MG PO TABS
40.0000 mg | ORAL_TABLET | Freq: Every day | ORAL | Status: DC
  Administered 2023-12-12: 40 mg via ORAL
  Filled 2023-12-11: qty 1

## 2023-12-11 MED ORDER — HYDROMORPHONE HCL 1 MG/ML IJ SOLN
0.2500 mg | INTRAMUSCULAR | Status: AC | PRN
  Administered 2023-12-11 (×8): 0.5 mg via INTRAVENOUS

## 2023-12-11 MED ORDER — CLONIDINE HCL (ANALGESIA) 100 MCG/ML EP SOLN
EPIDURAL | Status: DC | PRN
  Administered 2023-12-11: 100 ug

## 2023-12-11 MED ORDER — ALLOPURINOL 300 MG PO TABS
600.0000 mg | ORAL_TABLET | Freq: Every morning | ORAL | Status: DC
  Administered 2023-12-12: 600 mg via ORAL
  Filled 2023-12-11: qty 2

## 2023-12-11 MED ORDER — EPHEDRINE SULFATE-NACL 50-0.9 MG/10ML-% IV SOSY
PREFILLED_SYRINGE | INTRAVENOUS | Status: DC | PRN
  Administered 2023-12-11 (×2): 5 mg via INTRAVENOUS

## 2023-12-11 MED ORDER — SODIUM CHLORIDE 0.9% FLUSH: 9.0000 mL | INTRAVENOUS | Status: DC | PRN

## 2023-12-11 MED ORDER — EPHEDRINE 5 MG/ML INJ
INTRAVENOUS | Status: AC
  Filled 2023-12-11: qty 5

## 2023-12-11 MED ORDER — BUPIVACAINE-EPINEPHRINE (PF) 0.5% -1:200000 IJ SOLN
INTRAMUSCULAR | Status: AC
  Filled 2023-12-11: qty 30

## 2023-12-11 MED ORDER — FENTANYL CITRATE PF 50 MCG/ML IJ SOSY: 100.0000 ug | PREFILLED_SYRINGE | INTRAMUSCULAR | Status: DC | PRN

## 2023-12-11 MED ORDER — METHOCARBAMOL 1000 MG/10ML IJ SOLN
500.0000 mg | Freq: Four times a day (QID) | INTRAMUSCULAR | Status: DC | PRN
  Administered 2023-12-11: 500 mg via INTRAVENOUS

## 2023-12-11 MED ORDER — DIPHENHYDRAMINE HCL 12.5 MG/5ML PO ELIX: 12.5000 mg | ORAL_SOLUTION | ORAL | Status: DC | PRN

## 2023-12-11 MED ORDER — METHOCARBAMOL 500 MG PO TABS
500.0000 mg | ORAL_TABLET | Freq: Four times a day (QID) | ORAL | Status: DC | PRN
  Administered 2023-12-12: 500 mg via ORAL
  Filled 2023-12-11: qty 1

## 2023-12-11 MED ORDER — MIDAZOLAM HCL 2 MG/2ML IJ SOLN
2.0000 mg | INTRAMUSCULAR | Status: DC
  Filled 2023-12-11: qty 2

## 2023-12-11 MED ORDER — BUPIVACAINE-EPINEPHRINE 0.5% -1:200000 IJ SOLN
INTRAMUSCULAR | Status: DC | PRN
  Administered 2023-12-11: 30 mL

## 2023-12-11 MED ORDER — HYDROMORPHONE HCL 1 MG/ML IJ SOLN: 1.0000 mg | INTRAMUSCULAR | Status: DC | PRN

## 2023-12-11 MED ORDER — KETAMINE HCL 50 MG/5ML IJ SOSY
PREFILLED_SYRINGE | INTRAMUSCULAR | Status: AC
Start: 2023-12-11 — End: 2023-12-11
  Filled 2023-12-11: qty 5

## 2023-12-11 MED ORDER — FENTANYL CITRATE PF 50 MCG/ML IJ SOSY
100.0000 ug | PREFILLED_SYRINGE | INTRAMUSCULAR | Status: DC
  Administered 2023-12-11: 100 ug via INTRAVENOUS
  Filled 2023-12-11: qty 2

## 2023-12-11 MED ORDER — LIDOCAINE HCL (PF) 2 % IJ SOLN
INTRAMUSCULAR | Status: DC | PRN
Start: 2023-12-11 — End: 2023-12-11
  Administered 2023-12-11: 100 mg via INTRADERMAL

## 2023-12-11 MED ORDER — OXYCODONE HCL 5 MG PO TABS
10.0000 mg | ORAL_TABLET | Freq: Once | ORAL | Status: AC | PRN
  Administered 2023-12-11: 10 mg via ORAL

## 2023-12-11 MED ORDER — FENTANYL CITRATE PF 50 MCG/ML IJ SOSY
100.0000 ug | PREFILLED_SYRINGE | INTRAMUSCULAR | Status: AC | PRN
  Administered 2023-12-11 (×2): 100 ug via INTRAVENOUS

## 2023-12-11 MED ORDER — TRANEXAMIC ACID 1000 MG/10ML IV SOLN
2000.0000 mg | INTRAVENOUS | Status: DC
  Filled 2023-12-11: qty 20

## 2023-12-11 MED ORDER — HYDROMORPHONE HCL 2 MG/ML IJ SOLN
INTRAMUSCULAR | Status: AC
  Filled 2023-12-11: qty 1

## 2023-12-11 MED ORDER — METOPROLOL TARTRATE 25 MG PO TABS
25.0000 mg | ORAL_TABLET | Freq: Two times a day (BID) | ORAL | Status: DC
  Administered 2023-12-11: 25 mg via ORAL
  Filled 2023-12-11 (×2): qty 1

## 2023-12-11 MED ORDER — DEXAMETHASONE SODIUM PHOSPHATE 10 MG/ML IJ SOLN
INTRAMUSCULAR | Status: DC | PRN
  Administered 2023-12-11: 10 mg via INTRAVENOUS

## 2023-12-11 MED ORDER — HYDROMORPHONE HCL 1 MG/ML IJ SOLN
INTRAMUSCULAR | Status: AC
  Filled 2023-12-11: qty 2

## 2023-12-11 MED ORDER — APIXABAN 5 MG PO TABS
5.0000 mg | ORAL_TABLET | Freq: Two times a day (BID) | ORAL | Status: DC
  Administered 2023-12-12: 5 mg via ORAL
  Filled 2023-12-11: qty 1

## 2023-12-11 MED ORDER — METHOCARBAMOL 1000 MG/10ML IJ SOLN
INTRAMUSCULAR | Status: AC
Start: 2023-12-11 — End: 2023-12-11
  Filled 2023-12-11: qty 10

## 2023-12-11 MED ORDER — 0.9 % SODIUM CHLORIDE (POUR BTL) OPTIME
TOPICAL | Status: DC | PRN
  Administered 2023-12-11: 1000 mL

## 2023-12-11 MED ORDER — CHLORHEXIDINE GLUCONATE 0.12 % MT SOLN
15.0000 mL | Freq: Once | OROMUCOSAL | Status: AC
  Administered 2023-12-11: 15 mL via OROMUCOSAL

## 2023-12-11 MED ORDER — ORAL CARE MOUTH RINSE: 15.0000 mL | Freq: Once | OROMUCOSAL | Status: AC

## 2023-12-11 MED ORDER — CEFAZOLIN SODIUM-DEXTROSE 2-4 GM/100ML-% IV SOLN
2.0000 g | INTRAVENOUS | Status: AC
  Administered 2023-12-11: 2 g via INTRAVENOUS
  Filled 2023-12-11: qty 100

## 2023-12-11 MED ORDER — CEFAZOLIN SODIUM-DEXTROSE 2-4 GM/100ML-% IV SOLN
2.0000 g | Freq: Four times a day (QID) | INTRAVENOUS | Status: AC
  Administered 2023-12-11 – 2023-12-12 (×2): 2 g via INTRAVENOUS
  Filled 2023-12-11 (×2): qty 100

## 2023-12-11 MED ORDER — FENTANYL CITRATE PF 50 MCG/ML IJ SOSY
PREFILLED_SYRINGE | INTRAMUSCULAR | Status: AC
  Filled 2023-12-11: qty 4

## 2023-12-11 MED ORDER — GABAPENTIN 100 MG PO CAPS
200.0000 mg | ORAL_CAPSULE | Freq: Two times a day (BID) | ORAL | Status: DC
  Administered 2023-12-11 – 2023-12-12 (×2): 200 mg via ORAL
  Filled 2023-12-11 (×2): qty 2

## 2023-12-11 MED ORDER — BUPIVACAINE LIPOSOME 1.3 % IJ SUSP
INTRAMUSCULAR | Status: AC
  Filled 2023-12-11: qty 20

## 2023-12-11 MED ORDER — BUPIVACAINE LIPOSOME 1.3 % IJ SUSP
INTRAMUSCULAR | Status: DC | PRN
  Administered 2023-12-11: 20 mL

## 2023-12-11 MED ORDER — MORPHINE SULFATE ER 15 MG PO TBCR
30.0000 mg | EXTENDED_RELEASE_TABLET | Freq: Three times a day (TID) | ORAL | Status: DC
  Administered 2023-12-11 – 2023-12-12 (×2): 30 mg via ORAL
  Filled 2023-12-11 (×2): qty 2

## 2023-12-11 MED ORDER — KETAMINE HCL 50 MG/5ML IJ SOSY
PREFILLED_SYRINGE | INTRAMUSCULAR | Status: DC | PRN
  Administered 2023-12-11: 20 mg via INTRAVENOUS
  Administered 2023-12-11: 30 mg via INTRAVENOUS

## 2023-12-11 MED ORDER — STERILE WATER FOR IRRIGATION IR SOLN
Status: DC | PRN
  Administered 2023-12-11: 2000 mL

## 2023-12-11 MED ORDER — HYDROMORPHONE 1 MG/ML IV SOLN
INTRAVENOUS | Status: DC
  Administered 2023-12-11: 30 mg via INTRAVENOUS
  Administered 2023-12-12: 1.7 mg via INTRAVENOUS
  Administered 2023-12-12: 0.9 mg via INTRAVENOUS
  Administered 2023-12-12: 0.6 mg via INTRAVENOUS
  Filled 2023-12-11: qty 30

## 2023-12-11 MED ORDER — FAMOTIDINE 20 MG PO TABS
20.0000 mg | ORAL_TABLET | Freq: Two times a day (BID) | ORAL | Status: DC
  Administered 2023-12-11 – 2023-12-12 (×2): 20 mg via ORAL
  Filled 2023-12-11 (×2): qty 1

## 2023-12-11 MED ORDER — ONDANSETRON HCL 4 MG/2ML IJ SOLN
INTRAMUSCULAR | Status: DC | PRN
Start: 2023-12-11 — End: 2023-12-11
  Administered 2023-12-11: 4 mg via INTRAVENOUS

## 2023-12-11 MED ORDER — SODIUM CHLORIDE 0.9% IV SOLUTION
INTRAVENOUS | Status: AC | PRN
  Administered 2023-12-11: 1000 mL

## 2023-12-11 MED ORDER — OXYCODONE HCL 5 MG PO TABS
ORAL_TABLET | ORAL | Status: AC
  Filled 2023-12-11: qty 2

## 2023-12-11 MED ORDER — BUPIVACAINE-EPINEPHRINE (PF) 0.5% -1:200000 IJ SOLN
INTRAMUSCULAR | Status: DC | PRN
  Administered 2023-12-11: 15 mL via PERINEURAL

## 2023-12-11 MED ORDER — OXYCODONE HCL 5 MG/5ML PO SOLN: 5.0000 mg | Freq: Once | ORAL | Status: AC | PRN

## 2023-12-11 MED ORDER — SODIUM CHLORIDE (PF) 0.9 % IJ SOLN
INTRAMUSCULAR | Status: DC | PRN
  Administered 2023-12-11: 30 mL

## 2023-12-11 MED ORDER — DIPHENHYDRAMINE HCL 12.5 MG/5ML PO ELIX: 12.5000 mg | ORAL_SOLUTION | Freq: Four times a day (QID) | ORAL | Status: DC | PRN

## 2023-12-11 MED ORDER — ALUM & MAG HYDROXIDE-SIMETH 200-200-20 MG/5ML PO SUSP: 30.0000 mL | ORAL | Status: DC | PRN

## 2023-12-11 MED ORDER — ROCURONIUM BROMIDE 10 MG/ML (PF) SYRINGE
PREFILLED_SYRINGE | INTRAVENOUS | Status: AC
  Filled 2023-12-11: qty 10

## 2023-12-11 MED ORDER — METOCLOPRAMIDE HCL 5 MG/ML IJ SOLN: 5.0000 mg | Freq: Three times a day (TID) | INTRAMUSCULAR | Status: DC | PRN

## 2023-12-11 MED ORDER — PROPOFOL 10 MG/ML IV BOLUS
INTRAVENOUS | Status: DC | PRN
  Administered 2023-12-11: 150 mg via INTRAVENOUS

## 2023-12-11 MED ORDER — HYDROMORPHONE HCL 1 MG/ML IJ SOLN
INTRAMUSCULAR | Status: DC | PRN
  Administered 2023-12-11: 1 mg via INTRAVENOUS
  Administered 2023-12-11 (×2): .5 mg via INTRAVENOUS
  Administered 2023-12-11: 1 mg via INTRAVENOUS
  Administered 2023-12-11: .5 mg via INTRAVENOUS

## 2023-12-11 MED ORDER — ONDANSETRON HCL 4 MG/2ML IJ SOLN: 4.0000 mg | Freq: Four times a day (QID) | INTRAMUSCULAR | Status: DC | PRN

## 2023-12-11 MED ORDER — MAGNESIUM OXIDE -MG SUPPLEMENT 400 (240 MG) MG PO TABS
400.0000 mg | ORAL_TABLET | Freq: Two times a day (BID) | ORAL | Status: DC
  Administered 2023-12-11 – 2023-12-12 (×2): 400 mg via ORAL
  Filled 2023-12-11 (×3): qty 1

## 2023-12-11 MED ORDER — TRANEXAMIC ACID-NACL 1000-0.7 MG/100ML-% IV SOLN
1000.0000 mg | INTRAVENOUS | Status: AC
Start: 2023-12-11 — End: 2023-12-11
  Administered 2023-12-11: 1000 mg via INTRAVENOUS
  Filled 2023-12-11: qty 100

## 2023-12-11 MED ORDER — METOCLOPRAMIDE HCL 5 MG PO TABS: 5.0000 mg | ORAL_TABLET | Freq: Three times a day (TID) | ORAL | Status: DC | PRN

## 2023-12-11 MED ORDER — BISACODYL 5 MG PO TBEC: 5.0000 mg | DELAYED_RELEASE_TABLET | Freq: Every day | ORAL | Status: DC | PRN

## 2023-12-11 MED ORDER — DROPERIDOL 2.5 MG/ML IJ SOLN: 0.6250 mg | Freq: Once | INTRAMUSCULAR | Status: DC | PRN

## 2023-12-11 MED ORDER — DIPHENHYDRAMINE HCL 50 MG/ML IJ SOLN: 12.5000 mg | Freq: Four times a day (QID) | INTRAMUSCULAR | Status: DC | PRN

## 2023-12-11 MED ORDER — NALOXONE HCL 0.4 MG/ML IJ SOLN: 0.4000 mg | INTRAMUSCULAR | Status: DC | PRN

## 2023-12-11 MED ORDER — FENTANYL CITRATE PF 50 MCG/ML IJ SOSY: 50.0000 ug | PREFILLED_SYRINGE | Freq: Once | INTRAMUSCULAR | Status: DC

## 2023-12-11 MED ORDER — DEXAMETHASONE SODIUM PHOSPHATE 10 MG/ML IJ SOLN
INTRAMUSCULAR | Status: AC
  Filled 2023-12-11: qty 1

## 2023-12-11 MED ORDER — ONDANSETRON HCL 4 MG/2ML IJ SOLN
INTRAMUSCULAR | Status: AC
  Filled 2023-12-11: qty 2

## 2023-12-11 SURGICAL SUPPLY — 53 items
ATTUNE MED DOME PAT 41 KNEE (Knees) IMPLANT
ATTUNE PS FEM RT SZ 8 CEM KNEE (Femur) IMPLANT
ATTUNE PSRP INSR SZ8 6 KNEE (Insert) IMPLANT
BAG COUNTER SPONGE SURGICOUNT (BAG) ×1 IMPLANT
BAG DECANTER FOR FLEXI CONT (MISCELLANEOUS) ×1 IMPLANT
BAG ZIPLOCK 12X15 (MISCELLANEOUS) ×1 IMPLANT
BASE TIBIAL ROT PLAT SZ 10 KNE (Miscellaneous) IMPLANT
BLADE SAGITTAL 25.0X1.19X90 (BLADE) ×1 IMPLANT
BLADE SAW SGTL 11.0X1.19X90.0M (BLADE) ×1 IMPLANT
BLADE SURG SZ10 CARB STEEL (BLADE) ×1 IMPLANT
BNDG ELASTIC 6INX 5YD STR LF (GAUZE/BANDAGES/DRESSINGS) ×1 IMPLANT
BNDG ELASTIC 6X10 VLCR STRL LF (GAUZE/BANDAGES/DRESSINGS) IMPLANT
BNDG GAUZE DERMACEA FLUFF 4 (GAUZE/BANDAGES/DRESSINGS) IMPLANT
BOOTIES KNEE HIGH SLOAN (MISCELLANEOUS) ×1 IMPLANT
BOWL SMART MIX CTS (DISPOSABLE) ×1 IMPLANT
CEMENT HV SMART SET (Cement) ×2 IMPLANT
COVER SURGICAL LIGHT HANDLE (MISCELLANEOUS) ×1 IMPLANT
CUFF TRNQT CYL 34X4.125X (TOURNIQUET CUFF) ×1 IMPLANT
DRAPE TOP 10253 STERILE (DRAPES) ×1 IMPLANT
DRAPE U-SHAPE 47X51 STRL (DRAPES) ×1 IMPLANT
DRESSING AQUACEL AG SP 3.5X10 (GAUZE/BANDAGES/DRESSINGS) IMPLANT
DRSG AQUACEL AG ADV 3.5X10 (GAUZE/BANDAGES/DRESSINGS) ×1 IMPLANT
DURAPREP 26ML APPLICATOR (WOUND CARE) ×2 IMPLANT
ELECT PENCIL ROCKER SW 15FT (MISCELLANEOUS) ×1 IMPLANT
ELECT REM PT RETURN 15FT ADLT (MISCELLANEOUS) ×1 IMPLANT
GAUZE PAD ABD 8X10 STRL (GAUZE/BANDAGES/DRESSINGS) IMPLANT
GLOVE BIO SURGEON STRL SZ8 (GLOVE) ×2 IMPLANT
GLOVE BIOGEL PI IND STRL 7.0 (GLOVE) ×1 IMPLANT
GLOVE BIOGEL PI IND STRL 8 (GLOVE) ×2 IMPLANT
GLOVE SURG SYN 7.0 PF PI (GLOVE) ×1 IMPLANT
GOWN SRG XL LVL 4 BRTHBL STRL (GOWNS) ×1 IMPLANT
GOWN STRL REUS W/ TWL XL LVL3 (GOWN DISPOSABLE) ×2 IMPLANT
HOLDER FOLEY CATH W/STRAP (MISCELLANEOUS) IMPLANT
HOOD PEEL AWAY T7 (MISCELLANEOUS) ×3 IMPLANT
KIT TURNOVER KIT A (KITS) ×1 IMPLANT
MANIFOLD NEPTUNE II (INSTRUMENTS) ×1 IMPLANT
NS IRRIG 1000ML POUR BTL (IV SOLUTION) ×1 IMPLANT
PACK TOTAL KNEE CUSTOM (KITS) ×1 IMPLANT
PAD ARMBOARD POSITIONER FOAM (MISCELLANEOUS) ×1 IMPLANT
PIN STEINMAN FIXATION KNEE (PIN) IMPLANT
PROTECTOR NERVE ULNAR (MISCELLANEOUS) ×1 IMPLANT
SET HNDPC FAN SPRY TIP SCT (DISPOSABLE) ×1 IMPLANT
SPIKE FLUID TRANSFER (MISCELLANEOUS) ×2 IMPLANT
STRIP CLOSURE SKIN 1/2X4 (GAUZE/BANDAGES/DRESSINGS) IMPLANT
SUT ETHIBOND NAB CT1 #1 30IN (SUTURE) ×1 IMPLANT
SUT VIC AB 0 CT1 36 (SUTURE) ×2 IMPLANT
SUT VIC AB 2-0 CT1 TAPERPNT 27 (SUTURE) ×1 IMPLANT
SUT VICRYL AB 3-0 FS1 BRD 27IN (SUTURE) ×1 IMPLANT
SUTURE STRATFX 0 PDS 27 VIOLET (SUTURE) ×1 IMPLANT
TRAY FOLEY MTR SLVR 16FR STAT (SET/KITS/TRAYS/PACK) IMPLANT
WATER STERILE IRR 1000ML POUR (IV SOLUTION) ×2 IMPLANT
WRAP KNEE MAXI GEL POST OP (GAUZE/BANDAGES/DRESSINGS) ×1 IMPLANT
YANKAUER SUCT BULB TIP NO VENT (SUCTIONS) ×1 IMPLANT

## 2023-12-11 NOTE — Transfer of Care (Signed)
 Immediate Anesthesia Transfer of Care Note  Patient: [name]  Procedure(s) Performed: ARTHROPLASTY, KNEE, TOTAL (Right: Knee)  Patient Location: PACU  Anesthesia Type:General  Level of Consciousness: drowsy and patient cooperative  Airway & Oxygen Therapy: Patient Spontanous Breathing and Patient connected to face mask oxygen  Post-op Assessment: Report given to RN, Post -op Vital signs reviewed and stable, and Patient moving all extremities  Post vital signs: Reviewed and stable  Last Vitals:  Vitals Value Taken Time  BP 125/67 12/11/23 15:00  Temp    Pulse 85 12/11/23 15:03  Resp 15 12/11/23 15:03  SpO2 100 % 12/11/23 15:03  Vitals shown include unfiled device data.  Last Pain:  Vitals:   12/11/23 1051  TempSrc: Oral  PainSc:          Complications: No notable events documented.

## 2023-12-11 NOTE — Interval H&P Note (Signed)
 History and Physical Interval Note:  12/11/2023 11:41 AM  [name]  has presented today for surgery, with the diagnosis of right knee degenerative joint disease.  The various methods of treatment have been discussed with the patient and family. After consideration of risks, benefits and other options for treatment, the patient has consented to  Procedure(s) with comments: ARTHROPLASTY, KNEE, TOTAL (Right) - gen w/block as a surgical intervention.  The patient's history has been reviewed, patient examined, no change in status, stable for surgery.  I have reviewed the patient's chart and labs.  Questions were answered to the patient's satisfaction.     Michae Grimley G [name]

## 2023-12-11 NOTE — Anesthesia Procedure Notes (Signed)
 Procedure Name: LMA Insertion Date/Time: 12/11/2023 12:43 PM  Performed by: Memory Armida LABOR, CRNAPre-anesthesia Checklist: Patient identified, Emergency Drugs available, Suction available, Patient being monitored and Timeout performed Patient Re-evaluated:Patient Re-evaluated prior to induction Oxygen Delivery Method: Circle system utilized Preoxygenation: Pre-oxygenation with 100% oxygen Induction Type: IV induction LMA: LMA with gastric port inserted LMA Size: 4.0 Number of attempts: 1 Placement Confirmation: positive ETCO2 and breath sounds checked- equal and bilateral Tube secured with: Tape Dental Injury: Teeth and Oropharynx as per pre-operative assessment

## 2023-12-11 NOTE — Anesthesia Procedure Notes (Signed)
 Anesthesia Regional Block: Adductor canal block   Pre-Anesthetic Checklist: , timeout performed,  Correct Patient, Correct Site, Correct Laterality,  Correct Procedure, Correct Position, site marked,  Risks and benefits discussed,  Pre-op evaluation,  At surgeon's request and post-op pain management  Laterality: Right  Prep: Maximum Sterile Barrier Precautions used, chloraprep       Needles:  Injection technique: Single-shot  Needle Type: Echogenic Stimulator Needle     Needle Length: 9cm  Needle Gauge: 22     Additional Needles:   Procedures:,,,, ultrasound used (permanent image in chart),,    Narrative:  Start time: 12/11/2023 12:11 PM End time: 12/11/2023 12:14 PM Injection made incrementally with aspirations every 5 mL.  Performed by: Personally  Anesthesiologist: Paul Lamarr BRAVO, MD  Additional Notes: Risks, benefits, and alternative discussed. Patient gave consent for procedure. Patient prepped and draped in sterile fashion. Sedation administered, patient remains easily responsive to voice. Relevant anatomy identified with ultrasound guidance. Local anesthetic given in 5cc increments with no signs or symptoms of intravascular injection. No pain or paraesthesias with injection. Patient monitored throughout procedure with signs of LAST or immediate complications. Tolerated well. Ultrasound image placed in chart.  LANEY Paul, MD

## 2023-12-11 NOTE — Op Note (Signed)
 PREOP DIAGNOSIS: DJD RIGHT KNEE POSTOP DIAGNOSIS: same PROCEDURE: RIGHT TKR ANESTHESIA: General and block ATTENDING SURGEON: [name] ASSISTANT: [name] Earl PA  INDICATIONS FOR PROCEDURE: [name] is a 78 y.o. male who has struggled for a long time with pain due to degenerative arthritis of the right knee.  The patient has failed many conservative non-operative measures and at this point has pain which limits the ability to sleep and walk.  The patient is offered total knee replacement.  Informed operative consent was obtained after discussion of possible risks of anesthesia, infection, neurovascular injury, DVT, and death.  The importance of the post-operative rehabilitation protocol to optimize result was stressed extensively with the patient.  SUMMARY OF FINDINGS AND PROCEDURE:  [name] was taken to the operative suite where under the above anesthesia a right knee replacement was performed.  There were advanced degenerative changes and the bone quality was good.  We used the DePuy Attune system and placed size 8 femur, 10 tibia, 41 mm all polyethylene patella, and a size 6 mm spacer.  [name] Earl PA-C assisted throughout and was invaluable to the completion of the case in that he helped retract and maintain exposure while I placed components.  He also helped close thereby minimizing OR time.  The patient was admitted for appropriate post-op care to include perioperative antibiotics and mechanical and pharmacologic measures for DVT prophylaxis.  DESCRIPTION OF PROCEDURE:  [name] was taken to the operative suite where the above anesthesia was applied.  The patient was positioned supine and prepped and draped in normal sterile fashion.  An appropriate time out was performed.  After the administration of kefzol  pre-op antibiotic the leg was elevated and exsanguinated and a tourniquet inflated. A standard longitudinal incision was made on the anterior knee.  Dissection was  carried down to the extensor mechanism.  All appropriate anti-infective measures were used including the pre-operative antibiotic, betadine  impregnated drape, and closed hooded exhaust systems for each member of the surgical team.  A medial parapatellar incision was made in the extensor mechanism and the knee cap flipped and the knee flexed.  Some residual meniscal tissues were removed along with any remaining ACL/PCL tissue.  A guide was placed on the tibia and a flat cut was made on it's superior surface.  An intramedullary guide was placed in the femur and was utilized to make anterior and posterior cuts creating an appropriate flexion gap.  A second intramedullary guide was placed in the femur to make a distal cut properly balancing the knee with an extension gap equal to the flexion gap.  The three bones sized to the above mentioned sizes and the appropriate guides were placed and utilized.  A trial reduction was done and the knee easily came to full extension and the patella tracked well on flexion.  The trial components were removed and all bones were cleaned with pulsatile lavage and then dried thoroughly.  Cement was mixed and was pressurized onto the bones followed by placement of the aforementioned components.  Excess cement was trimmed and pressure was held on the components until the cement had hardened.  The tourniquet was deflated and a small amount of bleeding was controlled with cautery and pressure.  The knee was irrigated thoroughly.  The extensor mechanism was re-approximated with #1 ethibond in interrupted fashion.  The knee was flexed and the repair was solid.  The subcutaneous tissues were re-approximated with #0 and #2-0 vicryl and the skin closed with  a subcuticular stitch and steristrips.  A sterile dressing was applied.  Intraoperative fluids, EBL, and tourniquet time can be obtained from anesthesia records.  DISPOSITION:  The patient was taken to recovery room in stable condition and  scheduled to potentially go home same day depending on ability to walk and tolerate liquids..  Elvina Bosch G Esiah Bazinet 12/11/2023, 2:21 PM

## 2023-12-12 ENCOUNTER — Encounter (HOSPITAL_COMMUNITY): Payer: Self-pay | Admitting: Orthopaedic Surgery

## 2023-12-12 DIAGNOSIS — M1711 Unilateral primary osteoarthritis, right knee: Secondary | ICD-10-CM | POA: Diagnosis not present

## 2023-12-12 MED ORDER — OXYCODONE HCL 10 MG PO TABS: 10.0000 mg | ORAL_TABLET | Freq: Four times a day (QID) | ORAL | 0 refills | Status: AC | PRN

## 2023-12-12 NOTE — Progress Notes (Signed)
 Subjective: 1 Day Post-Op Procedure(s) (LRB): ARTHROPLASTY, KNEE, TOTAL (Right)  Patient wants to go home today. He wants the PCA stopped.  Activity level:  wbat Diet tolerance:  ok Voiding:  ok Patient reports pain as mild and moderate.    Objective: Vital signs in last 24 hours: Temp:  [97.2 F (36.2 C)-98 F (36.7 C)] 98 F (36.7 C) (09/10 0640) Pulse Rate:  [64-87] 65 (09/10 0640) Resp:  [10-28] 13 (09/10 0640) BP: (93-146)/(48-93) 102/59 (09/10 0640) SpO2:  [90 %-100 %] 98 % (09/10 0640) FiO2 (%):  [0 %-100 %] 0 % (09/10 0515) Weight:  [95.3 kg] 95.3 kg (09/09 1019)  Labs: No results for input(s): HGB in the last 72 hours. No results for input(s): WBC, RBC, HCT, PLT in the last 72 hours. No results for input(s): NA, K, CL, CO2, BUN, CREATININE, GLUCOSE, CALCIUM  in the last 72 hours. No results for input(s): LABPT, INR in the last 72 hours.  Physical Exam:  Neurologically intact ABD soft Neurovascular intact Sensation intact distally Intact pulses distally Dorsiflexion/Plantar flexion intact Incision: dressing C/D/I and scant drainage No cellulitis present Compartment soft  Assessment/Plan:  1 Day Post-Op Procedure(s) (LRB): ARTHROPLASTY, KNEE, TOTAL (Right) Advance diet Up with therapy D/C IV fluids D/c home today if cleared by PT. Stop PCA Resume home eliquis . Follow up in office 2 weeks post op.    [name] 12/12/2023, 8:22 AM

## 2023-12-12 NOTE — Progress Notes (Signed)
 Physical Therapy Treatment Patient Details Name: [name] MRN: [id number] DOB: 21-Apr-1945 Today's Date: 12/12/2023   History of Present Illness 78 yo male S/P RTKA 9//9/25. PMH:  L arm biceps tendon rupture, afib on eliquis , chronic pain, depression, GERD, gout, zenker's diverticulum s/p repair, pseudogout, hypogonadism    PT Comments  Then patient practiced steps with wife present, c stressed with patient to be very cognizant of  right knee stability  during stepping down. Also demonstrated to patient  that he could go sideways   . Patient has met PT goals for Dc home.   If plan is discharge home, recommend the following: A little help with walking and/or transfers;A little help with bathing/dressing/bathroom;Assistance with cooking/housework;Assist for transportation;Help with stairs or ramp for entrance   Can travel by private vehicle        Equipment Recommendations  None recommended by PT    Recommendations for Other Services       Precautions / Restrictions Precautions Precautions: Fall;Knee Restrictions Weight Bearing Restrictions Per Provider Order: No     Mobility  Bed Mobility Overal bed mobility: Needs Assistance Bed Mobility: Supine to Sit, Sit to Supine     Sit to supine: Contact guard assist   General bed mobility comments: use of belt to self assist leg onto bed    Transfers Overall transfer level: Needs assistance Equipment used: Rolling walker (2 wheels) Transfers: Sit to/from Stand Sit to Stand: Contact guard assist, From elevated surface           General transfer comment: mod assist from lower seat-recliner and transport  chair.    Ambulation/Gait Ambulation/Gait assistance: Contact guard assist Gait Distance (Feet): 40 Feet Assistive device: Rolling walker (2 wheels) Gait Pattern/deviations: Step-to pattern, Step-through pattern, Antalgic Gait velocity: decr     General Gait Details: cues for sequence   Stairs Stairs:  Yes Stairs assistance: Min assist Stair Management: One rail Right, Step to pattern, Forwards, With cane Number of Stairs: 3 General stair comments: cues for sequence and safety,  cues to be  very careful going down steps   Wheelchair Mobility     Tilt Bed    Modified Rankin (Stroke Patients Only)       Balance Overall balance assessment: Needs assistance Sitting-balance support: Feet supported, No upper extremity supported Sitting balance-Leahy Scale: Fair     Standing balance support: Reliant on assistive device for balance, During functional activity, Bilateral upper extremity supported Standing balance-Leahy Scale: Fair                              Hotel manager: No apparent difficulties  Cognition Arousal: Alert Behavior During Therapy: WFL for tasks assessed/performed   PT - Cognitive impairments: No apparent impairments                         Following commands: Intact      Cueing    Exercises   General Comments        Pertinent Vitals/Pain Pain Assessment Pain Assessment: 0-10 Pain Score: 4  Pain Descriptors / Indicators: Grimacing, Discomfort Pain Intervention(s): Monitored during session    Home Living Family/patient expects to be discharged to:: Private residence Living Arrangements: Spouse/significant other Available Help at Discharge: Family Type of Home: House Home Access: Stairs to enter Entrance Stairs-Rails: Left Entrance Stairs-Number of Steps: 4   Home Layout: One level Home Equipment: Agricultural consultant (2 wheels);Cane -  single point;Crutches;Toilet riser;Rollator (4 wheels) Additional Comments: upright  rollator    Prior Function            PT Goals (current goals can now be found in the care plan section) Acute Rehab PT Goals Patient Stated Goal: go home PT Goal Formulation: With patient/family Time For Goal Achievement: 12/26/23 Potential to Achieve Goals: Good Progress  towards PT goals: Progressing toward goals    Frequency    7X/week      PT Plan      Co-evaluation              AM-PAC PT 6 Clicks Mobility   Outcome Measure  Help needed turning from your back to your side while in a flat bed without using bedrails?: A Little Help needed moving from lying on your back to sitting on the side of a flat bed without using bedrails?: A Little Help needed moving to and from a bed to a chair (including a wheelchair)?: A Little Help needed standing up from a chair using your arms (e.g., wheelchair or bedside chair)?: A Little Help needed to walk in hospital room?: A Little Help needed climbing 3-5 steps with a railing? : A Little 6 Click Score: 18    End of Session Equipment Utilized During Treatment: Gait belt Activity Tolerance: Patient tolerated treatment well Patient left: in bed;with family/visitor present Nurse Communication: Mobility status PT Visit Diagnosis: Unsteadiness on feet (R26.81);Other abnormalities of gait and mobility (R26.89);Difficulty in walking, not elsewhere classified (R26.2);Pain Pain - Right/Left: Right Pain - part of body: Knee     Time: 1100-1125 PT Time Calculation (min) (ACUTE ONLY): 25 min  Charges:    $Gait Training: 23-37 mins PT General Charges $$ ACUTE PT VISIT: 1 Visit                     {Audreana Hancox [facility] PT Acute Rehabilitation Services Office [phone]    Leigh Darice Norris 12/12/2023, 1:09 PM

## 2023-12-12 NOTE — Discharge Summary (Signed)
 Patient ID: [name] MRN: [id number] DOB/AGE: 1945-07-10 78 y.o.  Admit date: 12/11/2023 Discharge date: 12/12/2023  Admission Diagnoses:  Principal Problem:   Primary localized osteoarthritis of right knee Active Problems:   Primary osteoarthritis of right knee   Discharge Diagnoses:  Same  Past Medical History:  Diagnosis Date   Atrial fibrillation with rapid ventricular response (HCC) 10/25/2018   Successful  DCCV shock x1 200 J 11/04/2018   Cancer (HCC)    skin   CHF (congestive heart failure) (HCC)    Chronic back pain    buldging disc   Depression    Dysrhythmia    GERD (gastroesophageal reflux disease)    GOUT    takes Allopurinol  daily and Colchicine  as needed   History of bronchitis    many yrs ago   History of colonoscopy    benign   History of shingles    Joint pain    Joint swelling    Muscle spasm    takes Robaxin  as needed   Nocturia    OSTEOARTHRITIS    Peripheral edema    takes Lasix  daily as needed   Pneumonia 1969   Seasonal allergies    takes Zyrtec and uses Flonase  is needed   TESTICULAR HYPOFUNCTION    Zenker diverticula     Surgeries: Procedure(s): ARTHROPLASTY, KNEE, TOTAL on 12/11/2023   Consultants:   Discharged Condition: Improved  Hospital Course: [name] is an 78 y.o. male who was admitted 12/11/2023 for operative treatment ofPrimary localized osteoarthritis of right knee. Patient has severe unremitting pain that affects sleep, daily activities, and work/hobbies. After pre-op clearance the patient was taken to the operating room on 12/11/2023 and underwent  Procedure(s): ARTHROPLASTY, KNEE, TOTAL.    Patient was given perioperative antibiotics:  Anti-infectives (From admission, onward)    Start     Dose/Rate Route Frequency Ordered Stop   12/11/23 1845  ceFAZolin  (ANCEF ) IVPB 2g/100 mL premix        2 g 200 mL/hr over 30 Minutes Intravenous Every 6 hours 12/11/23 1747 12/12/23 0105   12/11/23 1015  ceFAZolin  (ANCEF )  IVPB 2g/100 mL premix        2 g 200 mL/hr over 30 Minutes Intravenous On call to O.R. 12/11/23 1010 12/11/23 1314        Patient was given sequential compression devices, early ambulation, and chemoprophylaxis to prevent DVT.  Patient benefited maximally from hospital stay and there were no complications.    Recent vital signs: Patient Vitals for the past 24 hrs:  BP Temp Temp src Pulse Resp SpO2 Height Weight  12/12/23 0640 (!) 102/59 98 F (36.7 C) Oral 65 13 98 % -- --  12/12/23 0515 -- -- -- -- 14 97 % -- --  12/12/23 0155 (!) 100/52 98 F (36.7 C) Oral 71 17 99 % -- --  12/12/23 0035 -- -- -- -- 18 99 % -- --  12/11/23 2225 (!) 93/48 98 F (36.7 C) Oral 73 14 98 % -- --  12/11/23 2022 113/60 98 F (36.7 C) -- 87 14 100 % -- --  12/11/23 1840 -- -- -- -- 18 95 % -- --  12/11/23 1753 113/73 -- -- 79 18 100 % -- --  12/11/23 1730 (!) 110/58 -- -- 72 14 93 % -- --  12/11/23 1715 (!) 118/57 -- -- 87 18 92 % -- --  12/11/23 1700 (!) 110/59 -- -- 79 (!) 23 93 % -- --  12/11/23 1645 ROLLEN)  108/58 -- -- 79 18 94 % -- --  12/11/23 1630 113/68 -- -- 78 19 91 % -- --  12/11/23 1615 131/74 -- -- 77 11 99 % -- --  12/11/23 1613 131/74 -- -- 75 17 100 % -- --  12/11/23 1600 109/77 -- -- 68 17 98 % -- --  12/11/23 1547 114/69 -- -- 78 (!) 23 97 % -- --  12/11/23 1545 114/69 -- -- 79 18 100 % -- --  12/11/23 1530 125/71 -- -- 81 12 100 % -- --  12/11/23 1527 -- -- -- -- -- 90 % -- --  12/11/23 1515 129/75 -- -- 86 (!) 28 95 % -- --  12/11/23 1500 125/67 (!) 97.2 F (36.2 C) -- 67 14 100 % -- --  12/11/23 1227 -- -- -- 73 18 99 % -- --  12/11/23 1226 -- -- -- 72 15 97 % -- --  12/11/23 1225 (!) 144/93 -- -- 70 17 100 % -- --  12/11/23 1224 -- -- -- 69 12 100 % -- --  12/11/23 1223 -- -- -- 64 (!) 23 99 % -- --  12/11/23 1222 -- -- -- 67 12 100 % -- --  12/11/23 1221 -- -- -- 67 17 100 % -- --  12/11/23 1220 (!) 146/73 -- -- 70 10 100 % -- --  12/11/23 1219 -- -- -- 66 10 100 % -- --   12/11/23 1218 -- -- -- 68 16 98 % -- --  12/11/23 1217 -- -- -- 66 16 97 % -- --  12/11/23 1216 -- -- -- 65 16 96 % -- --  12/11/23 1215 (!) 142/85 -- -- -- -- -- -- --  12/11/23 1051 (!) 141/73 (!) 97.5 F (36.4 C) Oral 66 18 97 % -- --  12/11/23 1019 -- -- -- -- -- -- 6' 1 (1.854 m) 95.3 kg     Recent laboratory studies: No results for input(s): WBC, HGB, HCT, PLT, NA, K, CL, CO2, BUN, CREATININE, GLUCOSE, INR, CALCIUM  in the last 72 hours.  Invalid input(s): PT, 2   Discharge Medications:   Allergies as of 12/12/2023   No Known Allergies      Medication List     TAKE these medications    allopurinol  300 MG tablet Commonly known as: ZYLOPRIM  Take 600 mg by mouth every morning.   apixaban  5 MG Tabs tablet Commonly known as: Eliquis  Take 1 tablet (5 mg total) by mouth 2 (two) times daily.   Calcium  600 1500 (600 Ca) MG Tabs tablet Generic drug: calcium  carbonate Take 600 mg of elemental calcium  by mouth 2 (two) times daily.   cholecalciferol 25 MCG (1000 UNIT) tablet Commonly known as: VITAMIN D3 Take 1,000 Units by mouth daily.   clotrimazole 1 % cream Commonly known as: LOTRIMIN Apply 1 application  topically daily as needed (irritation).   cromolyn 5.2 MG/ACT nasal spray Commonly known as: NASALCROM Place 1 spray into both nostrils 2 (two) times daily as needed for allergies or rhinitis.   cyanocobalamin  1000 MCG tablet Commonly known as: VITAMIN B12 Take 1,000 mcg by mouth daily.   famotidine  20 MG tablet Commonly known as: PEPCID  Take 20 mg by mouth 2 (two) times daily.   furosemide  20 MG tablet Commonly known as: LASIX  Take 2 tablets (40 mg total) by mouth daily.   gabapentin  100 MG capsule Commonly known as: NEURONTIN  Take 200 mg by mouth 2 (two) times daily. Noon and midnight  magnesium  oxide 400 MG tablet Commonly known as: MAG-OX Take 400 mg by mouth 2 (two) times daily.   metoprolol  tartrate 25 MG  tablet Commonly known as: LOPRESSOR  Take 1 tablet (25 mg total) by mouth 2 (two) times daily. Hold SYSTOLIC BP <100, HR <60   morphine  30 MG 12 hr tablet Commonly known as: MS CONTIN  Take 30 mg by mouth 3 (three) times daily.   oxyCODONE  5 MG immediate release tablet Commonly known as: Oxy IR/ROXICODONE  Take 1 tablet (5 mg total) by mouth every 4 (four) hours as needed for severe pain. In addition to current chronic pain medication What changed: how much to take   Oxycodone  HCl 10 MG Tabs Take 1 tablet (10 mg total) by mouth every 6 (six) hours as needed for severe pain (pain score 7-10) or breakthrough pain (in addition to baseline pain meds for post op pain). What changed: You were already taking a medication with the same name, and this prescription was added. Make sure you understand how and when to take each.   sildenafil  100 MG tablet Commonly known as: VIAGRA  Take 100 mg by mouth daily as needed for erectile dysfunction.   tamsulosin  0.4 MG Caps capsule Commonly known as: FLOMAX  Take 0.4 mg by mouth at bedtime.   Testosterone  20.25 MG/ACT (1.62%) Gel Apply 20.25 mg topically daily. Apply to shoulders   triamcinolone  cream 0.1 % Commonly known as: KENALOG  Apply 1 Application topically 2 (two) times daily as needed (itching).               Durable Medical Equipment  (From admission, onward)           Start     Ordered   12/11/23 1747  DME Walker rolling  Once       Question:  Patient needs a walker to treat with the following condition  Answer:  Primary osteoarthritis of right knee   12/11/23 1747   12/11/23 1747  DME 3 n 1  Once        12/11/23 1747   12/11/23 1747  DME Bedside commode  Once       Question:  Patient needs a bedside commode to treat with the following condition  Answer:  Primary osteoarthritis of right knee   12/11/23 1747            Diagnostic Studies: No results found.  Disposition: Discharge disposition: 01-Home or Self  Care       Discharge Instructions     Call MD / Call 911   Complete by: As directed    If you experience chest pain or shortness of breath, CALL 911 and be transported to the hospital emergency room.  If you develope a fever above 101 F, pus (white drainage) or increased drainage or redness at the wound, or calf pain, call your surgeon's office.   Constipation Prevention   Complete by: As directed    Drink plenty of fluids.  Prune juice may be helpful.  You may use a stool softener, such as Colace (over the counter) 100 mg twice a day.  Use MiraLax  (over the counter) for constipation as needed.   Diet - low sodium heart healthy   Complete by: As directed    Discharge instructions   Complete by: As directed    INSTRUCTIONS AFTER JOINT REPLACEMENT   Remove items at home which could result in a fall. This includes throw rugs or furniture in walking pathways ICE to the affected joint every  three hours while awake for 30 minutes at a time, for at least the first 3-5 days, and then as needed for pain and swelling.  Continue to use ice for pain and swelling. You may notice swelling that will progress down to the foot and ankle.  This is normal after surgery.  Elevate your leg when you are not up walking on it.   Continue to use the breathing machine you got in the hospital (incentive spirometer) which will help keep your temperature down.  It is common for your temperature to cycle up and down following surgery, especially at night when you are not up moving around and exerting yourself.  The breathing machine keeps your lungs expanded and your temperature down.   DIET:  As you were doing prior to hospitalization, we recommend a well-balanced diet.  DRESSING / WOUND CARE / SHOWERING  You may shower 3 days after surgery, but keep the wounds dry during showering.  You may use an occlusive plastic wrap (Press'n Seal for example), NO SOAKING/SUBMERGING IN THE BATHTUB.  If the bandage gets wet,  change with a clean dry gauze.  If the incision gets wet, pat the wound dry with a clean towel.  ACTIVITY  Increase activity slowly as tolerated, but follow the weight bearing instructions below.   No driving for 6 weeks or until further direction given by your physician.  You cannot drive while taking narcotics.  No lifting or carrying greater than 10 lbs. until further directed by your surgeon. Avoid periods of inactivity such as sitting longer than an hour when not asleep. This helps prevent blood clots.  You may return to work once you are authorized by your doctor.     WEIGHT BEARING   Weight bearing as tolerated with assist device (walker, cane, etc) as directed, use it as long as suggested by your surgeon or therapist, typically at least 4-6 weeks.   EXERCISES  Results after joint replacement surgery are often greatly improved when you follow the exercise, range of motion and muscle strengthening exercises prescribed by your doctor. Safety measures are also important to protect the joint from further injury. Any time any of these exercises cause you to have increased pain or swelling, decrease what you are doing until you are comfortable again and then slowly increase them. If you have problems or questions, call your caregiver or physical therapist for advice.   Rehabilitation is important following a joint replacement. After just a few days of immobilization, the muscles of the leg can become weakened and shrink (atrophy).  These exercises are designed to build up the tone and strength of the thigh and leg muscles and to improve motion. Often times heat used for twenty to thirty minutes before working out will loosen up your tissues and help with improving the range of motion but do not use heat for the first two weeks following surgery (sometimes heat can increase post-operative swelling).   These exercises can be done on a training (exercise) mat, on the floor, on a table or on a  bed. Use whatever works the best and is most comfortable for you.    Use music or television while you are exercising so that the exercises are a pleasant break in your day. This will make your life better with the exercises acting as a break in your routine that you can look forward to.   Perform all exercises about fifteen times, three times per day or as directed.  You should  exercise both the operative leg and the other leg as well.  Exercises include:   Quad Sets - Tighten up the muscle on the front of the thigh (Quad) and hold for 5-10 seconds.   Straight Leg Raises - With your knee straight (if you were given a brace, keep it on), lift the leg to 60 degrees, hold for 3 seconds, and slowly lower the leg.  Perform this exercise against resistance later as your leg gets stronger.  Leg Slides: Lying on your back, slowly slide your foot toward your buttocks, bending your knee up off the floor (only go as far as is comfortable). Then slowly slide your foot back down until your leg is flat on the floor again.  Angel Wings: Lying on your back spread your legs to the side as far apart as you can without causing discomfort.  Hamstring Strength:  Lying on your back, push your heel against the floor with your leg straight by tightening up the muscles of your buttocks.  Repeat, but this time bend your knee to a comfortable angle, and push your heel against the floor.  You may put a pillow under the heel to make it more comfortable if necessary.   A rehabilitation program following joint replacement surgery can speed recovery and prevent re-injury in the future due to weakened muscles. Contact your doctor or a physical therapist for more information on knee rehabilitation.    CONSTIPATION  Constipation is defined medically as fewer than three stools per week and severe constipation as less than one stool per week.  Even if you have a regular bowel pattern at home, your normal regimen is likely to be  disrupted due to multiple reasons following surgery.  Combination of anesthesia, postoperative narcotics, change in appetite and fluid intake all can affect your bowels.   YOU MUST use at least one of the following options; they are listed in order of increasing strength to get the job done.  They are all available over the counter, and you may need to use some, POSSIBLY even all of these options:    Drink plenty of fluids (prune juice may be helpful) and high fiber foods Colace 100 mg by mouth twice a day  Senokot for constipation as directed and as needed Dulcolax (bisacodyl ), take with full glass of water   Miralax  (polyethylene glycol) once or twice a day as needed.  If you have tried all these things and are unable to have a bowel movement in the first 3-4 days after surgery call either your surgeon or your primary doctor.    If you experience loose stools or diarrhea, hold the medications until you stool forms back up.  If your symptoms do not get better within 1 week or if they get worse, check with your doctor.  If you experience the worst abdominal pain ever or develop nausea or vomiting, please contact the office immediately for further recommendations for treatment.   ITCHING:  If you experience itching with your medications, try taking only a single pain pill, or even half a pain pill at a time.  You can also use Benadryl  over the counter for itching or also to help with sleep.   TED HOSE STOCKINGS:  Use stockings on both legs until for at least 2 weeks or as directed by physician office. They may be removed at night for sleeping.  MEDICATIONS:  See your medication summary on the After Visit Summary that nursing will review with you.  You may  have some home medications which will be placed on hold until you complete the course of blood thinner medication.  It is important for you to complete the blood thinner medication as prescribed.  PRECAUTIONS:  If you experience chest pain or  shortness of breath - call 911 immediately for transfer to the hospital emergency department.   If you develop a fever greater that 101 F, purulent drainage from wound, increased redness or drainage from wound, foul odor from the wound/dressing, or calf pain - CONTACT YOUR SURGEON.                                                   FOLLOW-UP APPOINTMENTS:  If you do not already have a post-op appointment, please call the office for an appointment to be seen by your surgeon.  Guidelines for how soon to be seen are listed in your After Visit Summary, but are typically between 1-4 weeks after surgery.  OTHER INSTRUCTIONS:   Knee Replacement:  Do not place pillow under knee, focus on keeping the knee straight while resting. CPM instructions: 0-90 degrees, 2 hours in the morning, 2 hours in the afternoon, and 2 hours in the evening. Place foam block, curve side up under heel at all times except when in CPM or when walking.  DO NOT modify, tear, cut, or change the foam block in any way.  POST-OPERATIVE OPIOID TAPER INSTRUCTIONS: It is important to wean off of your opioid medication as soon as possible. If you do not need pain medication after your surgery it is ok to stop day one. Opioids include: Codeine, Hydrocodone (Norco, Vicodin), Oxycodone (Percocet, oxycontin ) and hydromorphone  amongst others.  Long term and even short term use of opiods can cause: Increased pain response Dependence Constipation Depression Respiratory depression And more.  Withdrawal symptoms can include Flu like symptoms Nausea, vomiting And more Techniques to manage these symptoms Hydrate well Eat regular healthy meals Stay active Use relaxation techniques(deep breathing, meditating, yoga) Do Not substitute Alcohol to help with tapering If you have been on opioids for less than two weeks and do not have pain than it is ok to stop all together.  Plan to wean off of opioids This plan should start within one week post  op of your joint replacement. Maintain the same interval or time between taking each dose and first decrease the dose.  Cut the total daily intake of opioids by one tablet each day Next start to increase the time between doses. The last dose that should be eliminated is the evening dose.     MAKE SURE YOU:  Understand these instructions.  Get help right away if you are not doing well or get worse.    Thank you for letting us  be a part of your medical care team.  It is a privilege we respect greatly.  We hope these instructions will help you stay on track for a fast and full recovery!   Increase activity slowly as tolerated   Complete by: As directed    Post-operative opioid taper instructions:   Complete by: As directed    POST-OPERATIVE OPIOID TAPER INSTRUCTIONS: It is important to wean off of your opioid medication as soon as possible. If you do not need pain medication after your surgery it is ok to stop day one. Opioids include: Codeine, Hydrocodone (Norco, Vicodin), Oxycodone (Percocet, oxycontin ) and  hydromorphone  amongst others.  Long term and even short term use of opiods can cause: Increased pain response Dependence Constipation Depression Respiratory depression And more.  Withdrawal symptoms can include Flu like symptoms Nausea, vomiting And more Techniques to manage these symptoms Hydrate well Eat regular healthy meals Stay active Use relaxation techniques(deep breathing, meditating, yoga) Do Not substitute Alcohol to help with tapering If you have been on opioids for less than two weeks and do not have pain than it is ok to stop all together.  Plan to wean off of opioids This plan should start within one week post op of your joint replacement. Maintain the same interval or time between taking each dose and first decrease the dose.  Cut the total daily intake of opioids by one tablet each day Next start to increase the time between doses. The last dose that  should be eliminated is the evening dose.           Follow-up Information     Sheril Coy, MD. Go on 12/24/2023.   Specialty: Orthopedic Surgery Why: your appointent is scheduled for 10:45. Contact information: [address] [place] [address] [phone]         [facility] Go on 12/14/2023.   Why: your outpatient physical therapy has been scheduled. the office will call you with a time Contact information: Physical Therapy [address] [place] [address] [phone]                  Signed: [name] Mt Thaila Bottoms 12/12/2023, 8:26 AM

## 2023-12-12 NOTE — Evaluation (Signed)
 Physical Therapy Evaluation Patient Details Name: [name] MRN: [id number] DOB: October 02, 1945 Today's Date: 12/12/2023  History of Present Illness  78 yo male S/P RTKA 9//9/25. PMH:  L arm biceps tendon rupture, afib on eliquis , chronic pain, depression, GERD, gout, zenker's diverticulum s/p repair, pseudogout, hypogonadism  Clinical Impression  The  patient ambulated x 80' Assisted with HEP. Patient will need to practice steps and should be ready for DC. Pt admitted with above diagnosis.  Pt currently with functional limitations due to the deficits listed below (see PT Problem List). Pt will benefit from acute skilled PT to increase their independence and safety with mobility to allow discharge.           If plan is discharge home, recommend the following: A little help with walking and/or transfers;A little help with bathing/dressing/bathroom;Assistance with cooking/housework;Assist for transportation;Help with stairs or ramp for entrance   Can travel by private vehicle        Equipment Recommendations None recommended by PT  Recommendations for Other Services       Functional Status Assessment Patient has had a recent decline in their functional status and demonstrates the ability to make significant improvements in function in a reasonable and predictable amount of time.     Precautions / Restrictions Precautions Precautions: Fall;Knee Restrictions Weight Bearing Restrictions Per Provider Order: No      Mobility  Bed Mobility Overal bed mobility: Needs Assistance Bed Mobility: Supine to Sit, Sit to Supine     Supine to sit: Contact guard Sit to supine: Contact guard assist   General bed mobility comments: use of belt to self assist leg onto bed    Transfers Overall transfer level: Needs assistance Equipment used: Rolling walker (2 wheels) Transfers: Sit to/from Stand Sit to Stand: Contact guard assist, From elevated surface                 Ambulation/Gait Ambulation/Gait assistance: Contact guard assist Gait Distance (Feet): 60 Feet Assistive device: Rolling walker (2 wheels) Gait Pattern/deviations: Step-to pattern, Step-through pattern Gait velocity: decr     General Gait Details: cues for sequence  Stairs            Wheelchair Mobility     Tilt Bed    Modified Rankin (Stroke Patients Only)       Balance Overall balance assessment: Needs assistance Sitting-balance support: Feet supported, No upper extremity supported Sitting balance-Leahy Scale: Fair     Standing balance support: Reliant on assistive device for balance, During functional activity, Bilateral upper extremity supported Standing balance-Leahy Scale: Fair                               Pertinent Vitals/Pain Pain Assessment Pain Assessment: 0-10 Pain Score: 4  Pain Descriptors / Indicators: Grimacing, Discomfort Pain Intervention(s): Monitored during session, Premedicated before session    Home Living Family/patient expects to be discharged to:: Private residence Living Arrangements: Spouse/significant other Available Help at Discharge: Family Type of Home: House Home Access: Stairs to enter Entrance Stairs-Rails: Left Entrance Stairs-Number of Steps: 4   Home Layout: One level Home Equipment: Agricultural consultant (2 wheels);Cane - single point;Crutches;Toilet riser;Rollator (4 wheels) Additional Comments: upright  rollator    Prior Function Prior Level of Function : Independent/Modified Independent             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       Extremity/Trunk Assessment   Upper  Extremity Assessment Upper Extremity Assessment: Overall WFL for tasks assessed    Lower Extremity Assessment Lower Extremity Assessment: RLE deficits/detail RLE Deficits / Details: knee flex 10-60 , asssitance for SLR    Cervical / Trunk Assessment Cervical / Trunk Assessment: Normal  Communication    Communication Communication: No apparent difficulties    Cognition Arousal: Alert Behavior During Therapy: WFL for tasks assessed/performed   PT - Cognitive impairments: No apparent impairments                         Following commands: Intact       Cueing       General Comments      Exercises Total Joint Exercises Quad Sets: AROM, Right, 10 reps Heel Slides: AAROM, Right, 5 reps Hip ABduction/ADduction: AAROM, Right, 5 reps Straight Leg Raises: AAROM, Right, 5 reps Long Arc Quad: AROM, Right, 5 reps Knee Flexion: AROM, Right, 5 reps   Assessment/Plan    PT Assessment Patient needs continued PT services  PT Problem List Decreased strength;Decreased range of motion;Decreased activity tolerance;Decreased safety awareness;Decreased mobility       PT Treatment Interventions DME instruction;Gait training;Stair training;Functional mobility training;Therapeutic activities;Therapeutic exercise;Patient/family education    PT Goals (Current goals can be found in the Care Plan section)  Acute Rehab PT Goals Patient Stated Goal: go home PT Goal Formulation: With patient/family Time For Goal Achievement: 12/26/23 Potential to Achieve Goals: Good    Frequency 7X/week     Co-evaluation               AM-PAC PT 6 Clicks Mobility  Outcome Measure Help needed turning from your back to your side while in a flat bed without using bedrails?: A Little Help needed moving from lying on your back to sitting on the side of a flat bed without using bedrails?: A Little Help needed moving to and from a bed to a chair (including a wheelchair)?: A Little Help needed standing up from a chair using your arms (e.g., wheelchair or bedside chair)?: A Little Help needed to walk in hospital room?: A Little Help needed climbing 3-5 steps with a railing? : A Little 6 Click Score: 18    End of Session Equipment Utilized During Treatment: Gait belt Activity Tolerance: Patient  tolerated treatment well Patient left: in bed;with family/visitor present Nurse Communication: Mobility status PT Visit Diagnosis: Unsteadiness on feet (R26.81);Other abnormalities of gait and mobility (R26.89);Difficulty in walking, not elsewhere classified (R26.2);Pain Pain - Right/Left: Right Pain - part of body: Knee    Time: 1017-1050 PT Time Calculation (min) (ACUTE ONLY): 33 min   Charges:   PT Evaluation $PT Eval Low Complexity: 1 Low PT Treatments $Gait Training: 8-22 mins PT General Charges $$ ACUTE PT VISIT: 1 Visit         Darice Potters PT Acute Rehabilitation Services Office [phone]   Potters Darice Norris 12/12/2023, 1:01 PM

## 2023-12-12 NOTE — Anesthesia Postprocedure Evaluation (Signed)
 Anesthesia Post Note  Patient: [name]  Procedure(s) Performed: ARTHROPLASTY, KNEE, TOTAL (Right: Knee)     Patient location during evaluation: PACU Anesthesia Type: General Level of consciousness: awake and alert Pain management: pain level controlled Vital Signs Assessment: post-procedure vital signs reviewed and stable Respiratory status: spontaneous breathing, nonlabored ventilation and respiratory function stable Cardiovascular status: blood pressure returned to baseline Postop Assessment: no apparent nausea or vomiting Anesthetic complications: no   No notable events documented.           Vertell Row

## 2023-12-12 NOTE — Plan of Care (Signed)
   Problem: Nutrition: Goal: Adequate nutrition will be maintained Outcome: Completed/Met   Problem: Elimination: Goal: Will not experience complications related to urinary retention Outcome: Completed/Met

## 2023-12-12 NOTE — TOC Transition Note (Signed)
 Transition of Care [facility]) - Discharge Note   Patient Details  Name: [name] MRN: [id number] Date of Birth: Dec 25, 1945  Transition of [facility]) CM/SW Contact:  NORMAN ASPEN, LCSW Phone Number: 12/12/2023, 11:44 AM   Clinical Narrative:     Met with pt and spouse who confirm pt does have needed DME in the home.  OPPT already arranged with [facility].  No further IP CM needs.  Final next level of care: OP Rehab Barriers to Discharge: No Barriers Identified   Patient Goals and CMS Choice Patient states their goals for this hospitalization and ongoing recovery are:: return home          Discharge Placement                       Discharge Plan and Services Additional resources added to the After Visit Summary for                  DME Arranged: N/A DME Agency: NA                  Social Drivers of Health (SDOH) Interventions SDOH Screenings   Food Insecurity: No Food Insecurity (12/11/2023)  Housing: Patient Declined (12/11/2023)  Transportation Needs: Patient Declined (12/11/2023)  Utilities: Patient Declined (12/11/2023)  Social Connections: Patient Declined (12/11/2023)  Tobacco Use: Low Risk  (12/11/2023)     Readmission Risk Interventions     No data to display

## 2023-12-21 ENCOUNTER — Encounter (HOSPITAL_COMMUNITY): Payer: Self-pay | Admitting: Internal Medicine

## 2023-12-21 ENCOUNTER — Other Ambulatory Visit (HOSPITAL_COMMUNITY): Payer: Self-pay | Admitting: Internal Medicine

## 2023-12-21 DIAGNOSIS — D869 Sarcoidosis, unspecified: Secondary | ICD-10-CM

## 2024-01-30 NOTE — Patient Instructions (Addendum)
 SURGICAL WAITING ROOM VISITATION  Patients having surgery or a procedure may have no more than 2 support people in the waiting area - these visitors may rotate.    Children under the age of 45 must have an adult with them who is not the patient.  Visitors with respiratory illnesses are discouraged from visiting and should remain at home.  If the patient needs to stay at the hospital during part of their recovery, the visitor guidelines for inpatient rooms apply. Pre-op nurse will coordinate an appropriate time for 1 support person to accompany patient in pre-op.  This support person may not rotate.    Please refer to the [facility] website for the visitor guidelines for Inpatients (after your surgery is over and you are in a regular room).       Your procedure is scheduled on:    Report to [facility] Main Entrance    Report to admitting at AM   Call this number if you have problems the morning of surgery [phone]   Do not eat food :After Midnight.   After Midnight you may have the following liquids until ______ AM/ PM DAY OF SURGERY  Water  Non-Citrus Juices (without pulp, NO RED-Apple, White grape, White cranberry) Black Coffee (NO MILK/CREAM OR CREAMERS, sugar ok)  Clear Tea (NO MILK/CREAM OR CREAMERS, sugar ok) regular and decaf                             Plain Jell-O (NO RED)                                           Fruit ices (not with fruit pulp, NO RED)                                     Popsicles (NO RED)                                                               Sports drinks like Gatorade (NO RED)                             If you have questions, please contact your surgeon's office.     Oral Hygiene is also important to reduce your risk of infection.                                    Remember - BRUSH YOUR TEETH THE MORNING OF SURGERY WITH YOUR REGULAR TOOTHPASTE  DENTURES WILL BE REMOVED PRIOR TO SURGERY PLEASE DO NOT APPLY Poly grip OR  ADHESIVES!!!   Do NOT smoke after Midnight   Stop all vitamins and herbal supplements 7 days before surgery.   Take these medicines the morning of surgery with A SIP OF WATER :   DO NOT TAKE ANY ORAL DIABETIC MEDICATIONS DAY OF YOUR SURGERY  Bring CPAP mask and tubing day of surgery.  You may not have any metal on your body including hair pins, jewelry, and body piercing             Do not wear make-up, lotions, powders, perfumes/cologne, or deodorant  Do not wear nail polish including gel and S&S, artificial/acrylic nails, or any other type of covering on natural nails including finger and toenails. If you have artificial nails, gel coating, etc. that needs to be removed by a nail salon please have this removed prior to surgery or surgery may need to be canceled/ delayed if the surgeon/ anesthesia feels like they are unable to be safely monitored.   Do not shave  48 hours prior to surgery.               Men may shave face and neck.   Do not bring valuables to the hospital. [facility] IS NOT             RESPONSIBLE   FOR VALUABLES.   Contacts, glasses, dentures or bridgework may not be worn into surgery.   Bring small overnight bag day of surgery.   DO NOT BRING YOUR HOME MEDICATIONS TO THE HOSPITAL. PHARMACY WILL DISPENSE MEDICATIONS LISTED ON YOUR MEDICATION LIST TO YOU DURING YOUR ADMISSION IN THE HOSPITAL!    Patients discharged on the day of surgery will not be allowed to drive home.  Someone NEEDS to stay with you for the first 24 hours after anesthesia.   Special Instructions: Bring a copy of your healthcare power of attorney and living will documents the day of surgery if you haven't scanned them before.              Please read over the following fact sheets you were given: IF YOU HAVE QUESTIONS ABOUT YOUR PRE-OP INSTRUCTIONS PLEASE CALL [phone].   If you received a COVID test during your pre-op visit  it is requested that you wear a mask  when out in public, stay away from anyone that may not be feeling well and notify your surgeon if you develop symptoms. If you test positive for Covid or have been in contact with anyone that has tested positive in the last 10 days please notify you surgeon.    [name] - Preparing for Surgery Before surgery, you can play an important role.  Because skin is not sterile, your skin needs to be as free of germs as possible.  You can reduce the number of germs on your skin by washing with CHG (chlorahexidine gluconate) soap before surgery.  CHG is an antiseptic cleaner which kills germs and bonds with the skin to continue killing germs even after washing. Please DO NOT use if you have an allergy to CHG or antibacterial soaps.  If your skin becomes reddened/irritated stop using the CHG and inform your nurse when you arrive at Short Stay. Do not shave (including legs and underarms) for at least 48 hours prior to the first CHG shower.  You may shave your face/neck. Please follow these instructions carefully:  1.  Shower with CHG Soap the night before surgery and the  morning of Surgery.  2.  If you choose to wash your hair, wash your hair first as usual with your  normal  shampoo.  3.  After you shampoo, rinse your hair and body thoroughly to remove the  shampoo.  4.  Use CHG as you would any other liquid soap.  You can apply chg directly  to the skin and wash                       Gently with a scrungie or clean washcloth.  5.  Apply the CHG Soap to your body ONLY FROM THE NECK DOWN.   Do not use on face/ open                           Wound or open sores. Avoid contact with eyes, ears mouth and genitals (private parts).                       Wash face,  Genitals (private parts) with your normal soap.             6.  Wash thoroughly, paying special attention to the area where your surgery  will be performed.  7.  Thoroughly rinse your body with warm water  from the neck down.  8.   DO NOT shower/wash with your normal soap after using and rinsing off  the CHG Soap.                9.  Pat yourself dry with a clean towel.            10.  Wear clean pajamas.            11.  Place clean sheets on your bed the night of your first shower and do not  sleep with pets. Day of Surgery : Do not apply any lotions/deodorants the morning of surgery.  Please wear clean clothes to the hospital/surgery center.  FAILURE TO FOLLOW THESE INSTRUCTIONS MAY RESULT IN THE CANCELLATION OF YOUR SURGERY PATIENT SIGNATURE_________________________________  NURSE SIGNATURE__________________________________  ________________________________________________________________________

## 2024-01-30 NOTE — Progress Notes (Addendum)
 Anesthesia Review:  [facility]  Cardiologist : DR Ethel Lesches LOV 06/20/23.  11/02/23- Telephone visit  josefa Emelia PIETY   PPM/ ICD: Device Orders: Rep Notified:  Chest x-ray : EKG : 06/20/23  Echo : 05/04/23  MR Card- 10/12/23  Stress test: Cardiac Cath :   Activity level:  Sleep Study/ CPAP : Fasting Blood Sugar :      / Checks Blood Sugar -- times a day:    Blood Thinner/ Instructions /Last Dose: ASA / Instructions/ Last Dose :    Eliquis - Pt's wife to call DR [facility] office in regards to Eliquis  instructions.  Spoke with Harlene neomi BARRE in regards to eliquis .  No need to hold per Harlene .  Okay for preop nurse to call and instruct pt per Harlene.    Preop nurse called pt and instructed pt no need to hold Eliquis  for procedure.  PT voiced understanding.    Sent message to DR Sheril again on 02/04/24 requesting orders.    12/06/23- Right knee surgery .    Hearing aids does not wear per wife    Telephone appt completed with pt and wife on 02/04/24. PT  has not been given  instructons in regards to Eliquis .  Instructed wife to call DR [facility] office in regards to Eliquis  Wife voiced understanding.

## 2024-01-31 NOTE — Progress Notes (Signed)
 Sent message, via epic in basket, requesting orders in epic from Careers adviser.

## 2024-02-01 ENCOUNTER — Other Ambulatory Visit: Payer: Self-pay | Admitting: Orthopaedic Surgery

## 2024-02-04 ENCOUNTER — Encounter (HOSPITAL_COMMUNITY)
Admission: RE | Admit: 2024-02-04 | Discharge: 2024-02-04 | Disposition: A | Discharge status: Home / Self Care | Source: Ambulatory Visit

## 2024-02-04 ENCOUNTER — Encounter (HOSPITAL_COMMUNITY): Payer: Self-pay

## 2024-02-04 ENCOUNTER — Other Ambulatory Visit: Payer: Self-pay

## 2024-02-04 VITALS — Ht 73.0 in | Wt 210.0 lb

## 2024-02-04 DIAGNOSIS — Z01818 Encounter for other preprocedural examination: Secondary | ICD-10-CM

## 2024-02-05 ENCOUNTER — Encounter (HOSPITAL_COMMUNITY)
Admission: RE | Disposition: A | Discharge status: Home / Self Care | Payer: Self-pay | Source: Home / Self Care | Attending: Orthopaedic Surgery

## 2024-02-05 ENCOUNTER — Encounter (HOSPITAL_COMMUNITY): Payer: Self-pay | Admitting: Orthopaedic Surgery

## 2024-02-05 ENCOUNTER — Ambulatory Visit (HOSPITAL_COMMUNITY)
Admission: RE | Admit: 2024-02-05 | Discharge: 2024-02-05 | Disposition: A | Discharge status: Home / Self Care | Attending: Orthopaedic Surgery | Admitting: Orthopaedic Surgery

## 2024-02-05 ENCOUNTER — Ambulatory Visit (HOSPITAL_COMMUNITY): Payer: Self-pay | Admitting: Certified Registered"

## 2024-02-05 ENCOUNTER — Ambulatory Visit (HOSPITAL_BASED_OUTPATIENT_CLINIC_OR_DEPARTMENT_OTHER): Payer: Self-pay | Admitting: Certified Registered"

## 2024-02-05 DIAGNOSIS — Z01818 Encounter for other preprocedural examination: Secondary | ICD-10-CM

## 2024-02-05 DIAGNOSIS — I4891 Unspecified atrial fibrillation: Secondary | ICD-10-CM | POA: Insufficient documentation

## 2024-02-05 DIAGNOSIS — M25661 Stiffness of right knee, not elsewhere classified: Secondary | ICD-10-CM | POA: Diagnosis present

## 2024-02-05 HISTORY — PX: KNEE CLOSED REDUCTION: SHX995

## 2024-02-05 LAB — CBC
HCT: 26.7 % — ABNORMAL LOW (ref 39.0–52.0)
Hemoglobin: 7.7 g/dL — ABNORMAL LOW (ref 13.0–17.0)
MCH: 27.8 pg (ref 26.0–34.0)
MCHC: 28.8 g/dL — ABNORMAL LOW (ref 30.0–36.0)
MCV: 96.4 fL (ref 80.0–100.0)
Platelets: 209 K/uL (ref 150–400)
RBC: 2.77 MIL/uL — ABNORMAL LOW (ref 4.22–5.81)
RDW: 15.1 % (ref 11.5–15.5)
WBC: 5 K/uL (ref 4.0–10.5)
nRBC: 0 % (ref 0.0–0.2)

## 2024-02-05 LAB — BASIC METABOLIC PANEL WITH GFR
Anion gap: 12 (ref 5–15)
BUN: 14 mg/dL (ref 8–23)
CO2: 26 mmol/L (ref 22–32)
Calcium: 9.3 mg/dL (ref 8.9–10.3)
Chloride: 95 mmol/L — ABNORMAL LOW (ref 98–111)
Creatinine, Ser: 0.87 mg/dL (ref 0.61–1.24)
GFR, Estimated: >60 mL/min (ref >60)
Glucose, Bld: 99 mg/dL (ref 70–99)
Potassium: 4.2 mmol/L (ref 3.5–5.1)
Sodium: 133 mmol/L — ABNORMAL LOW (ref 135–145)

## 2024-02-05 SURGERY — MANIPULATION, KNEE, CLOSED
Anesthesia: General | Site: Knee | Laterality: Right

## 2024-02-05 MED ORDER — CEFAZOLIN SODIUM-DEXTROSE 2-4 GM/100ML-% IV SOLN
2.0000 g | INTRAVENOUS | Status: AC
  Administered 2024-02-05: 2 g via INTRAVENOUS
  Filled 2024-02-05: qty 100

## 2024-02-05 MED ORDER — HYDROMORPHONE HCL 1 MG/ML IJ SOLN
INTRAMUSCULAR | Status: DC | PRN
  Administered 2024-02-05 (×4): 1 mg via INTRAVENOUS

## 2024-02-05 MED ORDER — FENTANYL CITRATE (PF) 100 MCG/2ML IJ SOLN
INTRAMUSCULAR | Status: AC
  Filled 2024-02-05: qty 2

## 2024-02-05 MED ORDER — ACETAMINOPHEN 500 MG PO TABS
1000.0000 mg | ORAL_TABLET | Freq: Once | ORAL | Status: AC
  Administered 2024-02-05: 1000 mg via ORAL
  Filled 2024-02-05: qty 2

## 2024-02-05 MED ORDER — OXYCODONE HCL 5 MG PO TABS: 5.0000 mg | ORAL_TABLET | Freq: Once | ORAL | Status: DC | PRN

## 2024-02-05 MED ORDER — BUPIVACAINE-EPINEPHRINE 0.25% -1:200000 IJ SOLN
INTRAMUSCULAR | Status: DC | PRN
  Administered 2024-02-05: 5 mL

## 2024-02-05 MED ORDER — FENTANYL CITRATE (PF) 100 MCG/2ML IJ SOLN
INTRAMUSCULAR | Status: DC | PRN
  Administered 2024-02-05: 100 ug via INTRAVENOUS

## 2024-02-05 MED ORDER — OXYCODONE HCL 5 MG/5ML PO SOLN: 5.0000 mg | Freq: Once | ORAL | Status: DC | PRN

## 2024-02-05 MED ORDER — LACTATED RINGERS IV SOLN: INTRAVENOUS | Status: DC

## 2024-02-05 MED ORDER — PROPOFOL 10 MG/ML IV BOLUS
INTRAVENOUS | Status: AC
  Filled 2024-02-05: qty 20

## 2024-02-05 MED ORDER — PROPOFOL 10 MG/ML IV BOLUS
INTRAVENOUS | Status: DC | PRN
  Administered 2024-02-05: 200 mg via INTRAVENOUS

## 2024-02-05 MED ORDER — ORAL CARE MOUTH RINSE: 15.0000 mL | Freq: Once | OROMUCOSAL | Status: AC

## 2024-02-05 MED ORDER — CHLORHEXIDINE GLUCONATE 0.12 % MT SOLN
15.0000 mL | Freq: Once | OROMUCOSAL | Status: AC
  Administered 2024-02-05: 15 mL via OROMUCOSAL

## 2024-02-05 MED ORDER — ONDANSETRON HCL 4 MG/2ML IJ SOLN
INTRAMUSCULAR | Status: DC | PRN
  Administered 2024-02-05: 4 mg via INTRAVENOUS

## 2024-02-05 MED ORDER — MIDAZOLAM HCL (PF) 2 MG/2ML IJ SOLN: 0.5000 mg | Freq: Once | INTRAMUSCULAR | Status: DC | PRN

## 2024-02-05 MED ORDER — BUPIVACAINE-EPINEPHRINE (PF) 0.25% -1:200000 IJ SOLN
INTRAMUSCULAR | Status: AC
  Filled 2024-02-05: qty 30

## 2024-02-05 MED ORDER — LIDOCAINE 1 % OPTIME INJ - NO CHARGE
INTRAMUSCULAR | Status: DC | PRN
Start: 2024-02-05 — End: 2024-02-05
  Administered 2024-02-05: 5 mL

## 2024-02-05 MED ORDER — LIDOCAINE HCL (PF) 1 % IJ SOLN
INTRAMUSCULAR | Status: AC
  Filled 2024-02-05: qty 30

## 2024-02-05 MED ORDER — HYDROMORPHONE HCL 1 MG/ML IJ SOLN
0.2500 mg | INTRAMUSCULAR | Status: DC | PRN
  Administered 2024-02-05 (×2): 0.5 mg via INTRAVENOUS

## 2024-02-05 MED ORDER — LIDOCAINE 2% (20 MG/ML) 5 ML SYRINGE
INTRAMUSCULAR | Status: DC | PRN
  Administered 2024-02-05: 40 mg via INTRAVENOUS

## 2024-02-05 MED ORDER — MEPERIDINE HCL 100 MG/ML IJ SOLN: 6.2500 mg | INTRAMUSCULAR | Status: DC | PRN

## 2024-02-05 MED ORDER — HYDROMORPHONE HCL 1 MG/ML IJ SOLN
INTRAMUSCULAR | Status: AC
  Filled 2024-02-05: qty 1

## 2024-02-05 MED ORDER — ONDANSETRON HCL 4 MG/2ML IJ SOLN
INTRAMUSCULAR | Status: AC
  Filled 2024-02-05: qty 2

## 2024-02-05 NOTE — Anesthesia Postprocedure Evaluation (Signed)
 Anesthesia Post Note  Patient: [name]  Procedure(s) Performed: MANIPULATION, KNEE, CLOSED (Right: Knee)     Patient location during evaluation: PACU Anesthesia Type: General Level of consciousness: awake and alert, oriented and patient cooperative Pain management: pain level controlled Vital Signs Assessment: post-procedure vital signs reviewed and stable Respiratory status: spontaneous breathing, nonlabored ventilation and respiratory function stable Cardiovascular status: blood pressure returned to baseline and stable Postop Assessment: no apparent nausea or vomiting Anesthetic complications: no   No notable events documented.  Last Vitals:  Vitals:   02/05/24 1630 02/05/24 1645  BP: (!) 103/53   Pulse: 68   Resp: 13   Temp:    SpO2: 94% 91%    Last Pain:  Vitals:   02/05/24 1630  TempSrc:   PainSc: 2                  Barbee Mamula,E. Lanell Dubie

## 2024-02-05 NOTE — Op Note (Unsigned)
 [name], [name] MEDICAL RECORD NO: [id number] ACCOUNT NO: [id number] DATE OF BIRTH: October 09, 1945 FACILITY: [facility] LOCATION: [facility] PHYSICIAN: Maude MATSU. Sheril, MD  Operative Report   DATE OF PROCEDURE: 02/05/2024  PREOPERATIVE DIAGNOSIS:  Right knee stiffness.  POSTOPERATIVE DIAGNOSIS:  Right knee stiffness.  PROCEDURE PERFORMED:  Right knee manipulation.  ANESTHESIA:  General.  SURGEON:  Maude MATSU. Roverto Bodmer, MD.  ASSISTANT:  [name] Earl, PA.  INDICATIONS:  The patient is a 78 year old man about 6 weeks from a knee replacement.  He has been unable to move his knee in therapy and consequently has some significant stiffness.  He is offered a manipulation.  Informed operative consent was obtained  after discussion of possible complications including reaction to anesthesia and fracture.  SUMMARY OF FINDINGS AND PROCEDURE:  Under general anesthesia, a right knee manipulation was performed.  We took his motion from about 0 to 30 up to 0 to 90 with some audible pops along the way. We then prepped extensively and injected some Marcaine  with  epinephrine . A Band-Aid was applied.  He was scheduled to go home same day.  DESCRIPTION OF PROCEDURE:  The patient was taken to the operating suite where a general anesthetic was applied without difficulty. He was positioned supine. After appropriate timeout, we performed a manipulation.  Asleep, his flexion was only to about 30  degrees with full extension.  I was able to improve that to about 90 degrees of flexion with significant pressure and audible pops. We then prepped extensively and injected with some Marcaine  and epinephrine  into the joint. A Band-Aid was applied.  DISPOSITION: The patient was taken to the recovery room in stable condition. Plans were for him to go home same day and follow up in the office in less than a week.  He will resume his therapy tomorrow. I will contact him by phone tonight.   NIK D: 02/05/2024 3:25:44 pm T:  02/05/2024 8:28:00 pm  JOB: [id number]/ [id number]

## 2024-02-05 NOTE — H&P (Signed)
 [name] is an 78 y.o. male.   Chief Complaint: right knee pain and stiffness HPI: [name] is here today for follow-up of his right knee. He continues with terrible pain and stiffness. It is limiting him. He is on his baseline pain medicine plus additional oxycodone . He is quite miserable. He has been doing physical therapy but has not made any progress.  Past Medical History:  Diagnosis Date   Atrial fibrillation with rapid ventricular response (HCC) 10/25/2018   Successful  DCCV shock x1 200 J 11/04/2018   Cancer (HCC)    skin   CHF (congestive heart failure) (HCC)    Chronic back pain    buldging disc   Dysrhythmia    GOUT    takes Allopurinol  daily and Colchicine  as needed   History of bronchitis    many yrs ago   History of colonoscopy    benign   History of shingles    Joint pain    Joint swelling    Muscle spasm    takes Robaxin  as needed   Nocturia    OSTEOARTHRITIS    Peripheral edema    takes Lasix  daily as needed   Seasonal allergies    takes Zyrtec and uses Flonase  is needed   TESTICULAR HYPOFUNCTION    Zenker diverticula     Past Surgical History:  Procedure Laterality Date   ANKLE SURGERY     4 times   BIOPSY  07/22/2021   Procedure: BIOPSY;  Surgeon: Eda Iha, MD;  Location: [facility] ENDOSCOPY;  Service: Gastroenterology;;   CAPSULOTOMY Right 08/20/2019   Procedure: SCAR CONTRACTURE AND CAPSULE RELEASE OF RIGHT BREAST TISSUE;  Surgeon: Lowery Estefana RAMAN, DO;  Location: [facility];  Service: Plastics;  Laterality: Right;   CARDIOVERSION N/A 11/04/2018   Procedure: CARDIOVERSION;  Surgeon: Alveta, Aleene PARAS, MD;  Location: [facility] ENDOSCOPY;  Service: Cardiovascular;  Laterality: N/A;   COLONOSCOPY     ELBOW SURGERY Right    torn tendon   ESOPHAGOGASTRODUODENOSCOPY (EGD) WITH PROPOFOL  N/A 07/22/2021   Procedure: ESOPHAGOGASTRODUODENOSCOPY (EGD) WITH PROPOFOL ;  Surgeon: Eda Iha, MD;  Location: [facility] ENDOSCOPY;  Service:  Gastroenterology;  Laterality: N/A;   INGUINAL HERNIA REPAIR     right   KNEE SURGERY     3 on each knee   ORIF HIP FRACTURE Right    SCAR REVISION Right 08/20/2019   Procedure: SCAR REVISION;  Surgeon: Lowery Estefana RAMAN, DO;  Location: [facility];  Service: Plastics;  Laterality: Right;   SHOULDER ARTHROSCOPY Right 06/15/2015   Procedure: RIGHT SHOULDER ARTHROSCOPY, ACROMIOPLASTY, DEBRIDEMENT;  Surgeon: Maude Herald, MD;  Location: [facility] OR;  Service: Orthopedics;  Laterality: Right;   shoulder arthrosopy Left    TONSILLECTOMY AND ADENOIDECTOMY     TOTAL KNEE ARTHROPLASTY Right 12/11/2023   Procedure: ARTHROPLASTY, KNEE, TOTAL;  Surgeon: Herald Maude, MD;  Location: [facility] ORS;  Service: Orthopedics;  Laterality: Right;  gen w/block   TUMOR EXCISION     sebaceous cyst removed from chest   ZENKER'S DIVERTICULECTOMY ENDOSCOPIC N/A 06/14/2016   Procedure: ZENKER'S DIVERTICULECTOMY ENDOSCOPIC;  Surgeon: Ida Loader, MD;  Location: [facility] OR;  Service: ENT;  Laterality: N/A;    Family History  Problem Relation Age of Onset   Colon cancer Neg Hx    Stomach cancer Neg Hx    Esophageal cancer Neg Hx    Social History:  reports that he has never smoked. He has never used smokeless tobacco. He reports current alcohol use of  about 28.0 standard drinks of alcohol per week. He reports that he does not use drugs.  Allergies: No Known Allergies  No medications prior to admission.    No results found for this or any previous visit (from the past 48 hours). No results found.  Review of Systems  Musculoskeletal:  Positive for arthralgias.       Right knee  All other systems reviewed and are negative.   There were no vitals taken for this visit. Physical Exam Constitutional:      Appearance: Normal appearance.  HENT:     Head: Normocephalic and atraumatic.     Nose: Nose normal.     Mouth/Throat:     Pharynx: Oropharynx is clear.  Eyes:     Extraocular Movements:  Extraocular movements intact.  Pulmonary:     Effort: Pulmonary effort is normal.  Abdominal:     Palpations: Abdomen is soft.  Musculoskeletal:     Cervical back: Normal range of motion.     Comments: Examination of the right knee shows range of motion about 0 to 30 degrees of flexion. Surgical incision is benign. There is some prepatellar swelling. Knee is stable to varus and valgus stressing. Calf is soft and nontender. He is neurovascularly intact distally.  Skin:    General: Skin is warm and dry.  Neurological:     Mental Status: He is alert and oriented to person, place, and time. Mental status is at baseline.      Assessment/Plan Assessment: Status post right TKR 12/11/23.    Plan: After discussing with the patient in we elected to set him up for a right knee manipulation under anesthesia. He has terrible stiffness and has made no progress with therapy. I reviewed the risk of anesthesia, infection, fracture, and bleeding related to this. He is in agreement and would like to proceed. Will try and get this set up quickly. He will be scheduled for outpatient physical therapy starting immediately the day after this procedure.  [name] Mt Mikaila Grunert, PA-C 02/05/2024, 6:40 AM

## 2024-02-05 NOTE — Transfer of Care (Signed)
 Immediate Anesthesia Transfer of Care Note  Patient: [name]  Procedure(s) Performed: MANIPULATION, KNEE, CLOSED (Right: Knee)  Patient Location: PACU  Anesthesia Type:General  Level of Consciousness: awake, alert , and oriented  Airway & Oxygen Therapy: Patient Spontanous Breathing and Patient connected to face mask oxygen  Post-op Assessment: Report given to RN and Post -op Vital signs reviewed and stable  Post vital signs: Reviewed and stable  Last Vitals:  Vitals Value Taken Time  BP 120/63 02/05/24 16:04  Temp 36.5 C 02/05/24 16:05  Pulse 71 02/05/24 16:06  Resp 18 02/05/24 16:06  SpO2 100 % 02/05/24 16:06  Vitals shown include unfiled device data.  Last Pain:  Vitals:   02/05/24 1159  TempSrc:   PainSc: 0-No pain         Complications: No notable events documented.

## 2024-02-05 NOTE — Brief Op Note (Signed)
 [name] [id number] 02/05/2024   PRE-OP DIAGNOSIS: stiff right TKR   POST-OP DIAGNOSIS: same  PROCEDURE: right knee manip  ANESTHESIA: general  [name]   Dictation #:  [id number]

## 2024-02-05 NOTE — Anesthesia Preprocedure Evaluation (Addendum)
 Anesthesia Evaluation  Patient identified by MRN, date of birth, ID band Patient awake    Reviewed: Allergy & Precautions, NPO status , Patient's Chart, lab work & pertinent test results  History of Anesthesia Complications Negative for: history of anesthetic complications  Airway Mallampati: II  TM Distance: >3 FB Neck ROM: Full    Dental  (+) Dental Advisory Given, Chipped, Missing   Pulmonary neg pulmonary ROS   breath sounds clear to auscultation       Cardiovascular + dysrhythmias Atrial Fibrillation  Rhythm:Regular Rate:Normal  04/2023 ECHO: EF 50 to 55%.  1. LV EF 55 %. The left ventricle has low normal function, no regional wall motion  abnormalities. Left ventricular diastolic parameters were normal. The average left ventricular global longitudinal strain is -17.9 %. The global longitudinal strain is normal.   2. RVF is mildly reduced. The right ventricular size is moderately enlarged. A Prominent RV moderator band is visualized. There is normal pulmonary artery systolic pressure. The estimated right ventricular systolic pressure is 27.0 mmHg.   3. Left atrial size was severely dilated.   4. Right atrial size was severely dilated.   5. The mitral valve is degenerative. Mild mitral valve regurgitation. No evidence of mitral stenosis. Moderate mitral annular calcification.   6. The aortic valve is tricuspid. There is severe calcifcation of the aortic valve. There is severe thickening of the aortic valve. Aortic valve regurgitation is not visualized. Aortic valve sclerosis/calcification is present, without any evidence of aortic stenosis.     Neuro/Psych Chronic pain: narcotics    GI/Hepatic negative GI ROS, Neg liver ROS,,,  Endo/Other  negative endocrine ROS    Renal/GU negative Renal ROS     Musculoskeletal   Abdominal   Peds  Hematology  (+) Blood dyscrasia (Hb 7.7), anemia Eliquis : took today   Anesthesia  Other Findings   Reproductive/Obstetrics                              Anesthesia Physical Anesthesia Plan  ASA: 3  Anesthesia Plan: General   Post-op Pain Management: Tylenol  PO (pre-op)*   Induction:   PONV Risk Score and Plan: 2 and Ondansetron , Dexamethasone  and Treatment may vary due to age or medical condition  Airway Management Planned: Natural Airway and Mask  Additional Equipment: None  Intra-op Plan:   Post-operative Plan:   Informed Consent: I have reviewed the patients History and Physical, chart, labs and discussed the procedure including the risks, benefits and alternatives for the proposed anesthesia with the patient or authorized representative who has indicated his/her understanding and acceptance.     Dental advisory given  Plan Discussed with: CRNA and Surgeon  Anesthesia Plan Comments:          Anesthesia Quick Evaluation

## 2024-02-06 ENCOUNTER — Encounter (HOSPITAL_COMMUNITY): Payer: Self-pay | Admitting: Orthopaedic Surgery

## 2024-02-21 ENCOUNTER — Encounter: Payer: Self-pay | Admitting: Podiatry

## 2024-02-21 ENCOUNTER — Ambulatory Visit (INDEPENDENT_AMBULATORY_CARE_PROVIDER_SITE_OTHER): Admitting: Podiatry

## 2024-02-21 DIAGNOSIS — B351 Tinea unguium: Secondary | ICD-10-CM | POA: Diagnosis not present

## 2024-02-21 DIAGNOSIS — M79674 Pain in right toe(s): Secondary | ICD-10-CM

## 2024-02-21 DIAGNOSIS — M79675 Pain in left toe(s): Secondary | ICD-10-CM

## 2024-02-21 DIAGNOSIS — L600 Ingrowing nail: Secondary | ICD-10-CM

## 2024-02-21 NOTE — Patient Instructions (Signed)

## 2024-02-22 NOTE — Progress Notes (Signed)
 Subjective:   Patient ID: [name], male   DOB: 78 y.o.   MRN: [id number]   HPI Patient presents with a painful right hallux nail lateral border that he cannot take care of himself and nail disease 1-5 both feet with history of knee replacement right recent   ROS      Objective:  Physical Exam  Neurovascular status intact with the patient found to have incurvated right hallux lateral border painful when pressed and thick yellow brittle nailbeds 1-5 both feet that are painful and he cannot cut.  Good digital perfusion well-oriented     Assessment:  Chronic ingrown toenail right hallux lateral border painful thick yellow mycotic nail infection 1-5 both feet     Plan:  H&P reviewed and I do think we should correct this corner due to discomfort he wants that done and I went ahead and I infiltrated the right big toe 60 mg like Marcaine  mixture sterile prep done he did read consent form going over risk factors and signs consent form.  I then under sterile conditions with sterile instrumentation removed the lateral border exposed matrix applied phenol 3 applications 30 seconds followed by alcohol lavage and applied sterile dressing after trimming the rest of the nail.  I then advised on leaving it on 24 hours take it off earlier if throbbing were to occur and to call if any issues were to occur.  I then debrided nailbeds 1-5 both feet no iatrogenic bleeding

## 5090-08-02 DEATH — deceased
